# Patient Record
Sex: Female | Born: 1955 | Race: Black or African American | Hispanic: No | Marital: Married | Smoking: Current every day smoker
Health system: Southern US, Community
[De-identification: ages and names within clinical notes are randomized; demographics above are authoritative.]

## PROBLEM LIST (undated history)

## (undated) DIAGNOSIS — Z923 Personal history of irradiation: Secondary | ICD-10-CM

## (undated) DIAGNOSIS — D242 Benign neoplasm of left breast: Secondary | ICD-10-CM

## (undated) DIAGNOSIS — I10 Essential (primary) hypertension: Secondary | ICD-10-CM

## (undated) HISTORY — PX: APPENDECTOMY: SHX54

## (undated) HISTORY — DX: Personal history of irradiation: Z92.3

## (undated) HISTORY — PX: ABDOMINAL HYSTERECTOMY: SHX81

## (undated) MED FILL — Dexamethasone Sodium Phosphate Inj 100 MG/10ML: INTRAMUSCULAR | Qty: 1 | Status: AC

## (undated) MED FILL — Fosaprepitant Dimeglumine For IV Infusion 150 MG (Base Eq): INTRAVENOUS | Qty: 5 | Status: AC

---

## 2002-01-21 ENCOUNTER — Encounter (INDEPENDENT_AMBULATORY_CARE_PROVIDER_SITE_OTHER): Payer: Self-pay | Admitting: Specialist

## 2002-01-21 ENCOUNTER — Observation Stay (HOSPITAL_COMMUNITY): Admission: RE | Admit: 2002-01-21 | Discharge: 2002-01-22 | Payer: Self-pay | Admitting: *Deleted

## 2002-01-21 ENCOUNTER — Encounter: Payer: Self-pay | Admitting: *Deleted

## 2003-05-21 ENCOUNTER — Other Ambulatory Visit: Admission: RE | Admit: 2003-05-21 | Discharge: 2003-05-21 | Payer: Self-pay | Admitting: *Deleted

## 2004-05-24 ENCOUNTER — Other Ambulatory Visit: Admission: RE | Admit: 2004-05-24 | Discharge: 2004-05-24 | Payer: Self-pay | Admitting: *Deleted

## 2004-07-26 ENCOUNTER — Emergency Department: Payer: Self-pay | Admitting: Emergency Medicine

## 2006-05-22 ENCOUNTER — Other Ambulatory Visit: Admission: RE | Admit: 2006-05-22 | Discharge: 2006-05-22 | Payer: Self-pay | Admitting: *Deleted

## 2008-10-24 ENCOUNTER — Encounter: Admission: RE | Admit: 2008-10-24 | Discharge: 2008-10-24 | Payer: Self-pay | Admitting: Internal Medicine

## 2009-01-26 ENCOUNTER — Other Ambulatory Visit: Admission: RE | Admit: 2009-01-26 | Discharge: 2009-01-26 | Payer: Self-pay | Admitting: Obstetrics and Gynecology

## 2009-03-02 ENCOUNTER — Encounter: Admission: RE | Admit: 2009-03-02 | Discharge: 2009-03-02 | Payer: Self-pay | Admitting: Internal Medicine

## 2011-01-21 NOTE — Op Note (Signed)
[facility]  Patient:    Mariah Schultz, Mariah Schultz Visit Number: [id number] MRN: [id number]          Service Type: SUR Location: 4W 0460 02 Attending Physician:  Collene Schlichter Dictated by:   Almedia Balls. Randell Patient, M.D. Proc. Date: 01/21/02 Admit Date:  01/21/2002   CC:         Leatha Gilding. Mezer, M.D.   Operative Report  PREOPERATIVE DIAGNOSES: 1. Abnormal uterine bleeding. 2. Uterine enlargement, rule out adenomyosis.  POSTOPERATIVE DIAGNOSES: 1. Abnormal uterine bleeding. 2. Uterine enlargement, rule out adenomyosis. 3. Pending pathology.  OPERATION:  Abdominal supracervical hysterectomy.  ANESTHESIA:  General orotracheal.  OPERATOR:  Almedia Balls. Randell Patient, M.D.  FIRST ASSISTANT:  Leatha Gilding. Mezer, M.D.  INDICATION FOR SURGERY:  The patient is a 55 year old with the above-noted problems, who was counseled as to the need for surgery and the type of surgery to be performed.  She was fully counseled as to the nature of the procedure and the risks involved to include risks of anesthesia; injury to bowel, bladder, blood vessels, ureters, postoperative hemorrhage, infection, recuperation, and possible hormone replacement should her ovaries be removed. She fully understands all of these considerations and wishes to proceed on Jan 21, 2002.  OPERATIVE FINDINGS:  On exploration of the upper abdomen, the lower liver edge area, the gallbladder, spleen, kidneys, periaortic areas, and appendix were normal to visualization and/or palpation.  The uterus was approximately 10-[redacted] weeks gestational size and soft.  Both ovaries appeared normal with hemorrhagic probable corpus lutein cyst present on the right ovary. Both tubes appeared normal as well.  DESCRIPTION OF PROCEDURE:  With the patient under general anesthesia, prepared and draped in the usual sterile fashion, with a Foley catheter in the bladder, a lower abdominal transverse incision was made and carried into the  peritoneal cavity without difficulty.  Self-retaining retractor was placed, and the bowel was packed off.  Kelly clamps were used to clamp the uteroovarian anastomoses, tubes, and round ligaments bilaterally for traction and hemostasis.  The round ligaments were then transected using Bovie electrocoagulation.  Additional sutures were necessary in the area of the left round ligament.  Heaney clamps were placed across the uteroovarian anastomoses bilaterally; these areas were then cut and individually doubly ligated with 1 chromic catgut for conservation of both ovaries.  Skeletonization of the uterine vessels and development of the bladder flap was then accomplished using Bovie electrocoagulation and sharp dissection.  Heaney clamps were then used to clamp the uterine vessels bilaterally which were then cut, clamped, and suture ligated with 1 chromic catgut.  Heaney clamps were likewise used to clamp the remaining portions of the cardinal ligaments bilaterally; these structures were then cut and suture ligated with 1 chromic catgut.  It was then possible to excise the uterine fundus.  This was accomplished using Bovie electrocoagulation.  Small bleeders were rendered hemostatic with Bovie electrocoagulation, and larger vessels were suture ligated.  Figure-of-eight sutures of #1 chromic catgut were used to cross the cervical stump for hemostasis and reapproximation of this area.  After noting that hemostasis was maintained, the areas were reperitonealized with interrupted mattress of 1 chromic catgut.  The ovaries were then suspended from the round ligaments by interrupted sutures of #1 chromic catgut.  The are was lavaged with copious amounts of Lactated Ringers solution and after small bleeders had been either rendered hemostatic using Bovie electrocoagulation or small suture ligatures, the peritoneum was closed with continuous suture of 0 Vicryl.  Fascia  was closed with two sutures of 0  Vicryl which were brought from the lateral aspects of the incision and tied separately in the midline.  Each layer was lavaged with copious amounts of Lactated Ringers solution prior to closure. The subcutaneous fat layer was reapproximated with interrupted sutures of 0 Vicryl.  Skin was closed with a subcuticular suture of 3-0 plain catgut. Estimated blood loss was 300 mL.  The patient was taken to the recovery room in good condition with clear urine in the Foley catheter tubing.  She will be placed on 23-hour observation following surgery. Dictated by:   Almedia Balls Randell Patient, M.D. Attending Physician:  Collene Schlichter DD:  01/21/02 TD:  01/21/02 Job: [id number]

## 2011-01-21 NOTE — H&P (Signed)
[facility]  Patient:    Mariah Schultz, Mariah Schultz Visit Number: [id number] MRN: [id number]          Service Type: SUR Location: 4W 0460 02 Attending Physician:  Collene Schlichter Dictated by:   Almedia Balls. Randell Patient, M.D. Admit Date:  01/21/2002                           History and Physical  CHIEF COMPLAINT:  Excessive bleeding, pain, fibroids.  HISTORY OF PRESENT ILLNESS:  The patient is a 55 year old gravida 4, para 3, whose last menstrual period was in mid-February 2003.  She underwent Pap smear in April 2003 which was normal.  She has been seen in our office since early April 2003 with complaints of heavy bleeding, for which she changes tampon and pad every 45 minutes, with clots present.  She has severe pain at other times. Examination in early April revealed her uterus to be approximately [redacted] weeks gestational size and somewhat tender.  She is admitted at this time for abdominal hysterectomy and possible bilateral salpingo-oophorectomy because of the probable uterine fibroids and abnormal bleeding.  She underwent hysteroscopy D&C after saline sonogram indicated an increase in endometrial tissue; the pathology report on the [facility] specimen was totally benign.  She has been fully counseled as to the nature of the procedure and the risks involved, to include risks of anesthesia, injury to bowel, bladder, blood vessels, ureters, postoperative hemorrhage, infection, recuperation, possibility of estrogen replacement should her ovaries be removed.  She fully understands all these considerations and wishes to proceed on Jan 21, 2002.  PAST MEDICAL HISTORY:  Past medical history includes an illness for which she was hospitalized, chronic bronchitis in 1986.  She is smoking approximately one pack a day, which she has done for greater than 30 years and states that she is decreasing her tobacco usage.  ALLERGIES:  She is allergic to no medications.  MEDICATIONS:  She has  taken no medications recently except for analgesics for pain.  FAMILY HISTORY:  A number of relatives with cardiovascular disease.  REVIEW OF SYSTEMS:  HEENT:  Wears glasses.  CARDIORESPIRATORY:  Negative. GASTROINTESTINAL:  Negative.  GENITOURINARY:  As in present illness. NEUROMUSCULAR:  Negative.  PHYSICAL EXAMINATION:  VITAL SIGNS:  Height 5 feet 5-1/2 inches.  Weight 184 pounds.  Blood pressure 112/70.  Respirations 18.  GENERAL:  A well-developed black female in no acute distress.  HEENT:  Within normal limits.  NECK:  Supple without masses, adenopathy, or bruits.  CHEST:  Lungs clear to percussion and auscultation.  CARDIAC:  Regular rate and rhythm without murmurs.  ABDOMEN:  Soft with slight mass effect in the lower abdomen, which is irregular but nontender.  PELVIC:  External genitalia, Bartholins, urethra, and Skenes glands within normal limits.  Vagina is clean.  Cervix is slightly inflamed and displaced downward and to the left.  Uterus is midposition, approximately [redacted] weeks gestational size and quite irregular and somewhat tender.  Adnexal areas are not palpable because of the size of her uterus but on ultrasound appeared to be normal.  Anterior and posterior cul-de-sac exam is confirmatory.  EXTREMITIES:  Within normal limits.  NEUROLOGIC:  Central nervous system grossly intact.  SKIN:  Without suspicious lesions.  IMPRESSION:  Abnormal uterine bleeding, pelvic pain, probably uterine fibroids.  DISPOSITION:  As noted above. Dictated by:   Almedia Balls Randell Patient, M.D. Attending Physician:  Collene Schlichter DD:  01/17/02 TD:  01/18/02 Job: [id number]

## 2011-01-21 NOTE — Discharge Summary (Signed)
[facility]  Patient:    Mariah Schultz, Mariah Schultz Visit Number: [id number] MRN: [id number]          Service Type: SUR Location: 4W 0460 02 Attending Physician:  Collene Schlichter Dictated by:   Almedia Balls. Randell Patient, M.D. Admit Date:  01/21/2002 Discharge Date: 01/22/2002                             Discharge Summary  HISTORY:  Patient is a 55 year old with uterine enlargement, abnormal uterine bleeding, anemia secondary to above for hysterectomy, possible bilateral salpingo-oophorectomy on Jan 21, 2002.  The remainder of her history and physical are as previously dictated.  LABORATORY DATA:  Preoperative hemoglobin 9.6 g.  Chest x-ray was within normal limits.  HOSPITAL COURSE:  The patient was taken to the operating room on Jan 21, 2002 at which time an abdominal supracervical hysterectomy was performed.  The patient did well postoperatively.  Diet and ambulation were progressed over the evening of May 19 and early morning of May 20.  On the morning of May 20 she was afebrile and experiencing no problems except for pain which was controlled by analgesics.  It was felt that she could be discharged at this time.  FINAL DIAGNOSES:  Abnormal uterine bleeding, uterine enlargement, anemia secondary to above.  OPERATION:  Abdominal supracervical hysterectomy.  PATHOLOGY:  Report unavailable at the time of dictation.  DISPOSITION:  Discharged home to return to the office in two weeks for follow-up.  She was instructed to gradually progress her activities over several weeks at home and to limit lifting and driving for two weeks.  She was fully ambulatory, on a regular diet, and in good condition at the time of discharge.  She was given prescription for Dilaudid or generic 2 mg #30 to be taken one or two q.4h. p.r.n. pain and doxycycline 100 mg #12 to be taken one b.i.d. Dictated by:   Almedia Balls Randell Patient, M.D. Attending Physician:  Collene Schlichter DD:   01/22/02 TD:  01/24/02 Job: [phone] [id number]

## 2012-12-24 ENCOUNTER — Other Ambulatory Visit: Payer: Self-pay | Admitting: Internal Medicine

## 2012-12-24 DIAGNOSIS — Z1231 Encounter for screening mammogram for malignant neoplasm of breast: Secondary | ICD-10-CM

## 2013-01-03 ENCOUNTER — Other Ambulatory Visit (HOSPITAL_COMMUNITY)
Admission: RE | Admit: 2013-01-03 | Discharge: 2013-01-03 | Disposition: A | Discharge status: Home / Self Care | Payer: BC Managed Care – PPO | Source: Ambulatory Visit | Attending: Obstetrics and Gynecology | Admitting: Obstetrics and Gynecology

## 2013-01-03 ENCOUNTER — Other Ambulatory Visit: Payer: Self-pay | Admitting: Nurse Practitioner

## 2013-01-03 DIAGNOSIS — Z01419 Encounter for gynecological examination (general) (routine) without abnormal findings: Secondary | ICD-10-CM | POA: Insufficient documentation

## 2013-01-03 DIAGNOSIS — Z1151 Encounter for screening for human papillomavirus (HPV): Secondary | ICD-10-CM | POA: Insufficient documentation

## 2013-01-24 ENCOUNTER — Ambulatory Visit
Admission: RE | Admit: 2013-01-24 | Discharge: 2013-01-24 | Disposition: A | Discharge status: Home / Self Care | Payer: BC Managed Care – PPO | Source: Ambulatory Visit | Attending: Internal Medicine | Admitting: Internal Medicine

## 2013-01-24 DIAGNOSIS — Z1231 Encounter for screening mammogram for malignant neoplasm of breast: Secondary | ICD-10-CM

## 2013-02-25 ENCOUNTER — Other Ambulatory Visit: Payer: Self-pay | Admitting: Internal Medicine

## 2013-02-25 ENCOUNTER — Encounter (HOSPITAL_COMMUNITY): Payer: Self-pay | Admitting: *Deleted

## 2013-02-25 ENCOUNTER — Ambulatory Visit
Admission: RE | Admit: 2013-02-25 | Discharge: 2013-02-25 | Disposition: A | Discharge status: Home / Self Care | Payer: BC Managed Care – PPO | Source: Ambulatory Visit | Attending: Internal Medicine | Admitting: Internal Medicine

## 2013-02-25 ENCOUNTER — Emergency Department (HOSPITAL_COMMUNITY): Payer: BC Managed Care – PPO

## 2013-02-25 ENCOUNTER — Encounter (HOSPITAL_COMMUNITY): Payer: Self-pay | Admitting: Anesthesiology

## 2013-02-25 ENCOUNTER — Emergency Department (HOSPITAL_COMMUNITY): Payer: BC Managed Care – PPO | Admitting: Anesthesiology

## 2013-02-25 ENCOUNTER — Inpatient Hospital Stay (HOSPITAL_COMMUNITY)
Admission: EM | Admit: 2013-02-25 | Discharge: 2013-02-27 | DRG: 883 | Disposition: A | Discharge status: Home / Self Care | Payer: BC Managed Care – PPO | Attending: General Surgery | Admitting: General Surgery

## 2013-02-25 ENCOUNTER — Encounter (HOSPITAL_COMMUNITY): Admission: EM | Disposition: A | Discharge status: Home / Self Care | Payer: Self-pay | Source: Home / Self Care

## 2013-02-25 DIAGNOSIS — K3580 Unspecified acute appendicitis: Secondary | ICD-10-CM

## 2013-02-25 DIAGNOSIS — K352 Acute appendicitis with generalized peritonitis, without abscess: Principal | ICD-10-CM | POA: Diagnosis present

## 2013-02-25 DIAGNOSIS — R109 Unspecified abdominal pain: Secondary | ICD-10-CM

## 2013-02-25 DIAGNOSIS — K3533 Acute appendicitis with perforation and localized peritonitis, with abscess: Secondary | ICD-10-CM | POA: Diagnosis present

## 2013-02-25 DIAGNOSIS — R630 Anorexia: Secondary | ICD-10-CM | POA: Diagnosis present

## 2013-02-25 DIAGNOSIS — F17200 Nicotine dependence, unspecified, uncomplicated: Secondary | ICD-10-CM | POA: Diagnosis present

## 2013-02-25 DIAGNOSIS — I959 Hypotension, unspecified: Secondary | ICD-10-CM | POA: Diagnosis present

## 2013-02-25 DIAGNOSIS — Z7982 Long term (current) use of aspirin: Secondary | ICD-10-CM

## 2013-02-25 DIAGNOSIS — K35209 Acute appendicitis with generalized peritonitis, without abscess, unspecified as to perforation: Principal | ICD-10-CM | POA: Diagnosis present

## 2013-02-25 HISTORY — DX: Essential (primary) hypertension: I10

## 2013-02-25 HISTORY — PX: LAPAROSCOPIC APPENDECTOMY: SHX408

## 2013-02-25 LAB — BASIC METABOLIC PANEL
BUN: 13 mg/dL (ref 6–23)
CO2: 29 mEq/L (ref 19–32)
Calcium: 9.3 mg/dL (ref 8.4–10.5)
Chloride: 89 mEq/L — ABNORMAL LOW (ref 96–112)
Creatinine, Ser: 0.83 mg/dL (ref 0.50–1.10)
GFR calc Af Amer: 88 mL/min — ABNORMAL LOW (ref >90)
GFR calc non Af Amer: 76 mL/min — ABNORMAL LOW (ref >90)
Glucose, Bld: 104 mg/dL — ABNORMAL HIGH (ref 70–99)
Potassium: 3.2 mEq/L — ABNORMAL LOW (ref 3.5–5.1)
Sodium: 130 mEq/L — ABNORMAL LOW (ref 135–145)

## 2013-02-25 LAB — URINE MICROSCOPIC-ADD ON

## 2013-02-25 LAB — URINALYSIS, ROUTINE W REFLEX MICROSCOPIC
Bilirubin Urine: NEGATIVE
Glucose, UA: NEGATIVE mg/dL
Ketones, ur: NEGATIVE mg/dL
Leukocytes, UA: NEGATIVE
Nitrite: NEGATIVE
Protein, ur: NEGATIVE mg/dL
Specific Gravity, Urine: >1.046 — ABNORMAL HIGH (ref 1.005–1.030)
Urobilinogen, UA: 1 mg/dL (ref 0.0–1.0)
pH: 6 (ref 5.0–8.0)

## 2013-02-25 LAB — CBC
HCT: 44 % (ref 36.0–46.0)
Hemoglobin: 15.8 g/dL — ABNORMAL HIGH (ref 12.0–15.0)
MCH: 31.6 pg (ref 26.0–34.0)
MCHC: 35.9 g/dL (ref 30.0–36.0)
MCV: 88 fL (ref 78.0–100.0)
Platelets: 268 10*3/uL (ref 150–400)
RBC: 5 MIL/uL (ref 3.87–5.11)
RDW: 14.1 % (ref 11.5–15.5)
WBC: 19.7 10*3/uL — ABNORMAL HIGH (ref 4.0–10.5)

## 2013-02-25 SURGERY — APPENDECTOMY, LAPAROSCOPIC
Anesthesia: General | Site: Abdomen | Wound class: Dirty or Infected

## 2013-02-25 MED ORDER — SUCCINYLCHOLINE CHLORIDE 20 MG/ML IJ SOLN
INTRAMUSCULAR | Status: DC | PRN
  Administered 2013-02-25: 140 mg via INTRAVENOUS

## 2013-02-25 MED ORDER — HYDROMORPHONE HCL PF 1 MG/ML IJ SOLN
INTRAMUSCULAR | Status: AC
  Administered 2013-02-25 (×2): 0.5 mg
  Filled 2013-02-25: qty 1

## 2013-02-25 MED ORDER — FENTANYL CITRATE 0.05 MG/ML IJ SOLN
INTRAMUSCULAR | Status: DC | PRN
  Administered 2013-02-25: 50 ug via INTRAVENOUS
  Administered 2013-02-25 (×2): 100 ug via INTRAVENOUS

## 2013-02-25 MED ORDER — ROCURONIUM BROMIDE 100 MG/10ML IV SOLN
INTRAVENOUS | Status: DC | PRN
  Administered 2013-02-25: 25 mg via INTRAVENOUS

## 2013-02-25 MED ORDER — IOHEXOL 300 MG/ML  SOLN
100.0000 mL | Freq: Once | INTRAMUSCULAR | Status: AC | PRN
  Administered 2013-02-25: 100 mL via INTRAVENOUS

## 2013-02-25 MED ORDER — SODIUM CHLORIDE 0.9 % IR SOLN
Status: DC | PRN
  Administered 2013-02-25: 1000 mL

## 2013-02-25 MED ORDER — ONDANSETRON HCL 4 MG/2ML IJ SOLN
INTRAMUSCULAR | Status: DC | PRN
  Administered 2013-02-25: 4 mg via INTRAVENOUS

## 2013-02-25 MED ORDER — LIDOCAINE HCL (CARDIAC) 20 MG/ML IV SOLN
INTRAVENOUS | Status: DC | PRN
  Administered 2013-02-25: 100 mg via INTRAVENOUS

## 2013-02-25 MED ORDER — IOHEXOL 300 MG/ML  SOLN
30.0000 mL | Freq: Once | INTRAMUSCULAR | Status: AC | PRN
  Administered 2013-02-25: 30 mL via ORAL

## 2013-02-25 MED ORDER — MIDAZOLAM HCL 5 MG/5ML IJ SOLN
INTRAMUSCULAR | Status: DC | PRN
  Administered 2013-02-25: 2 mg via INTRAVENOUS

## 2013-02-25 MED ORDER — BUPIVACAINE-EPINEPHRINE 0.5% -1:200000 IJ SOLN
INTRAMUSCULAR | Status: DC | PRN
  Administered 2013-02-25: 18 mL

## 2013-02-25 MED ORDER — LACTATED RINGERS IV SOLN
INTRAVENOUS | Status: DC | PRN
  Administered 2013-02-25 (×2): via INTRAVENOUS

## 2013-02-25 MED ORDER — GLYCOPYRROLATE 0.2 MG/ML IJ SOLN
INTRAMUSCULAR | Status: DC | PRN
  Administered 2013-02-25: 0.6 mg via INTRAVENOUS

## 2013-02-25 MED ORDER — PROPOFOL 10 MG/ML IV BOLUS
INTRAVENOUS | Status: DC | PRN
  Administered 2013-02-25: 200 mg via INTRAVENOUS

## 2013-02-25 MED ORDER — NEOSTIGMINE METHYLSULFATE 1 MG/ML IJ SOLN
INTRAMUSCULAR | Status: DC | PRN
  Administered 2013-02-25: 4 mg via INTRAVENOUS

## 2013-02-25 MED ORDER — SODIUM CHLORIDE 0.9 % IV SOLN
1.0000 g | INTRAVENOUS | Status: AC
  Administered 2013-02-25: 1 g via INTRAVENOUS
  Filled 2013-02-25: qty 1

## 2013-02-25 SURGICAL SUPPLY — 42 items
APL SKNCLS STERI-STRIP NONHPOA (GAUZE/BANDAGES/DRESSINGS) ×1
APPLIER CLIP LOGIC TI 5 (MISCELLANEOUS) IMPLANT
APPLIER CLIP ROT 10 11.4 M/L (STAPLE)
APR CLP MED LRG 11.4X10 (STAPLE)
APR CLP MED LRG 33X5 (MISCELLANEOUS)
BAG SPEC RTRVL LRG 6X4 10 (ENDOMECHANICALS) ×1
BANDAGE ADHESIVE 1X3 (GAUZE/BANDAGES/DRESSINGS) ×6 IMPLANT
BENZOIN TINCTURE PRP APPL 2/3 (GAUZE/BANDAGES/DRESSINGS) ×2 IMPLANT
CANISTER SUCTION 2500CC (MISCELLANEOUS) ×2 IMPLANT
CHLORAPREP W/TINT 26ML (MISCELLANEOUS) ×2 IMPLANT
CLIP APPLIE ROT 10 11.4 M/L (STAPLE) IMPLANT
CLOTH BEACON ORANGE TIMEOUT ST (SAFETY) ×2 IMPLANT
CLSR STERI-STRIP ANTIMIC 1/2X4 (GAUZE/BANDAGES/DRESSINGS) ×1 IMPLANT
COVER SURGICAL LIGHT HANDLE (MISCELLANEOUS) ×2 IMPLANT
CUTTER LINEAR ENDO 35 ETS (STAPLE) ×1 IMPLANT
CUTTER LINEAR ENDO 35 ETS TH (STAPLE) IMPLANT
DECANTER SPIKE VIAL GLASS SM (MISCELLANEOUS) ×2 IMPLANT
ELECT REM PT RETURN 9FT ADLT (ELECTROSURGICAL) ×2
ELECTRODE REM PT RTRN 9FT ADLT (ELECTROSURGICAL) ×1 IMPLANT
ENDOLOOP SUT PDS II  0 18 (SUTURE)
ENDOLOOP SUT PDS II 0 18 (SUTURE) IMPLANT
GLOVE SURG SIGNA 7.5 PF LTX (GLOVE) ×2 IMPLANT
GOWN STRL NON-REIN LRG LVL3 (GOWN DISPOSABLE) ×2 IMPLANT
GOWN STRL REIN XL XLG (GOWN DISPOSABLE) ×2 IMPLANT
KIT BASIN OR (CUSTOM PROCEDURE TRAY) ×2 IMPLANT
KIT ROOM TURNOVER OR (KITS) ×2 IMPLANT
NS IRRIG 1000ML POUR BTL (IV SOLUTION) ×2 IMPLANT
PAD ARMBOARD 7.5X6 YLW CONV (MISCELLANEOUS) ×4 IMPLANT
POUCH SPECIMEN RETRIEVAL 10MM (ENDOMECHANICALS) ×2 IMPLANT
RELOAD /EVU35 (ENDOMECHANICALS) IMPLANT
RELOAD CUTTER ETS 35MM STAND (ENDOMECHANICALS) IMPLANT
SCALPEL HARMONIC ACE (MISCELLANEOUS) ×2 IMPLANT
SET IRRIG TUBING LAPAROSCOPIC (IRRIGATION / IRRIGATOR) ×2 IMPLANT
SLEEVE ENDOPATH XCEL 5M (ENDOMECHANICALS) ×2 IMPLANT
SPECIMEN JAR SMALL (MISCELLANEOUS) ×2 IMPLANT
SUT MON AB 4-0 PC3 18 (SUTURE) ×2 IMPLANT
TOWEL OR 17X24 6PK STRL BLUE (TOWEL DISPOSABLE) ×2 IMPLANT
TOWEL OR 17X26 10 PK STRL BLUE (TOWEL DISPOSABLE) ×2 IMPLANT
TRAY LAPAROSCOPIC (CUSTOM PROCEDURE TRAY) ×2 IMPLANT
TROCAR XCEL BLUNT TIP 100MML (ENDOMECHANICALS) ×2 IMPLANT
TROCAR XCEL NON-BLD 5MMX100MML (ENDOMECHANICALS) ×2 IMPLANT
WATER STERILE IRR 1000ML POUR (IV SOLUTION) IMPLANT

## 2013-02-25 NOTE — ED Notes (Signed)
Pt did not answer when called for triage 

## 2013-02-25 NOTE — ED Notes (Signed)
Pt is here with lower abdominal pain since Friday and states that with movement she can feel the pressure.  Pt had CT done today and was told to come to the EMERGENCY room for acute appendicitis.

## 2013-02-25 NOTE — Op Note (Signed)
APPENDECTOMY LAPAROSCOPIC  Procedure Note  Mariah Schultz [facility] 02/25/2013   Pre-op Diagnosis: perforated appendicitis     Post-op Diagnosis: sam3  Procedure(s): APPENDECTOMY LAPAROSCOPIC  Surgeon(s): Shelly Rubenstein, MD  Anesthesia: General  Staff:  Circulator: Ezzard Flax, RN Relief Circulator: Carlean Purl, RN Scrub Person: Arnoldo Morale, CST  Estimated Blood Loss: Minimal               Specimens: sent to path          [facility]   Date: 02/25/2013  Time: 10:29 PM

## 2013-02-25 NOTE — Op Note (Signed)
NAME:  Mariah Schultz NO.:  [id number]  MEDICAL RECORD NO.:  [id number]  LOCATION:  [facility]                         FACILITY:  [facility]  PHYSICIAN:  Abigail Miyamoto, M.D. DATE OF BIRTH:  November 21, 1955  DATE OF PROCEDURE:  02/25/2013 DATE OF DISCHARGE:                              OPERATIVE REPORT   PREOPERATIVE DIAGNOSIS:  Perforated appendicitis.  POSTOPERATIVE DIAGNOSIS:  Perforated appendicitis.  PROCEDURE:  Laparoscopic appendectomy.  SURGEON:  Abigail Miyamoto, MD  ANESTHESIA:  General and 0.5% Marcaine.  ESTIMATED BLOOD LOSS:  Minimal.  INDICATION:  This is a 57 year old female, who presented with 3 day history of right lower quadrant abdominal pain.  She was found on CAT scan to have findings consistent with acute appendicitis with abscess. Decision was made to proceed to the operating room for laparoscopic appendectomy.  FINDINGS:  The patient was found to have small bowel completely tethered to the midline of the abdominal cavity.  She had a phlegmon from the acute appendicitis.  This appeared to involve an ovary with an ovarian cyst as well.  PROCEDURE IN DETAIL:  The patient was brought to the operating room, identified as Mariah Schultz.  She was placed supine on the operating table and general anesthesia was induced.  Her abdomen was then prepped and draped in usual sterile fashion.  I made a small incision just below the umbilicus with scalpel.  I took this down to the fascia which was opened with scalpel.  A hemostat was then used to pass to the peritoneal cavity under direct vision.  A 0 Vicryl pursestring suture was placed around the fascial opening.  The Hasson port was placed through the opening and insufflation of the abdomen was begun.  I then placed a 5 mm trocar in the patient's right upper quadrant under direct vision.  I visualized the right lower quadrant of pelvis.  The patient had a lot of small bowel tether to the abdominal  wall.  There was no plane that could be easily identified.  I therefore had to place my lower trocar to the right of the midline.  This was done under direct vision and avoided the small bowel.  I then visualized the right colon and cecum.  The patient had what appeared to be a acute appendicitis going into a phlegmon involving the ovary with an ovarian cyst as well. This cystic structure easily tore off of the pelvic sidewall and appendix and was pulled out through the umbilical trocar and sent to Pathology.  I was able to finally identify the base of the appendix and transect with the laparoscopic GIA stapler.  The rest of the appendix went down to a phlegmon involving the ovary and the right pelvis.  I was finally able to pull the appendix up and separated from what appeared to be the ovary.  The mesoappendix was controlled with the Harmonic Scalpel.  The appendix came apart in pieces and was pulled out individually through the port of the umbilicus.  Once this was done, I thoroughly irrigated the right lower quadrant with normal saline.  There was no purulence identified, only phlegmon.  I believe the abscess from the CAT scan represented more of an ovarian cyst.  At this point, hemostasis appeared to be achieved.  After irrigating the abdomen with normal saline, all ports removed under direct vision.  The abdomen was deflated with 0 Vicryl.  The umbilicus was then tied in place closing the fascial defect.  All incisions were anesthetized with Marcaine and closed with 4-0 Monocryl subcuticular sutures.  Steri-Strips and Band-Aids were then applied.  The patient tolerated the procedure well.  All of the counts were correct at the end of the procedure.  The patient was then extubated in the operating room and taken in stable condition to recovery room.     Abigail Miyamoto, M.D.     DB/MEDQ  D:  02/25/2013  T:  02/25/2013  Job:  [id number]

## 2013-02-25 NOTE — Transfer of Care (Signed)
Immediate Anesthesia Transfer of Care Note  Patient: Mariah Schultz  Procedure(s) Performed: Procedure(s): APPENDECTOMY LAPAROSCOPIC (N/A)  Patient Location: PACU  Anesthesia Type:General  Level of Consciousness: awake, alert  and oriented  Airway & Oxygen Therapy: Patient Spontanous Breathing and Patient connected to nasal cannula oxygen  Post-op Assessment: Report given to PACU RN and Post -op Vital signs reviewed and stable  Post vital signs: Reviewed and stable  Complications: No apparent anesthesia complications

## 2013-02-25 NOTE — ED Provider Notes (Signed)
She has been ill for several days with low abdominal pain, anorexia, weaknesss and low blood pressure.today, she saw her PCP, who ordered a CT scan, that returned as positive for appendicitis.  Exam alert, calm, cooperative, in mild distress. Abdomen- Mild, low abdominal tenderness, bilaterally.  Assessment: Uncomplicated acute appendicitis. She will need surgical consultation and likely appendectomy, today.   1731 PM-Consult complete with Dr. Magnus Ivan. Patient case explained and discussed. He agrees to admit patient for further evaluation and treatment. Call ended at 1735   Medical screening examination/treatment/procedure(s) were conducted as a shared visit with non-physician practitioner(s) and myself.  I personally evaluated the patient during the encounter  Flint Melter, MD 02/25/13 1816

## 2013-02-25 NOTE — ED Provider Notes (Signed)
History    CSN: [id number] Arrival date & time 02/25/13  1500  First MD Initiated Contact with Patient 02/25/13 1530     Chief Complaint  Patient presents with  . Abdominal Pain   (Consider location/radiation/quality/duration/timing/severity/associated sxs/prior Treatment) HPI Pt is a 57yo female advised to come to ED by her PCP, Dr. Nehemiah Settle after CT done this morning indicated acute appendicitis. Dr. Nehemiah Settle saw her this morning for lower abdominal pain that started on Friday 6/20, described as achy and cramping, worse with standing and walking, 5/10.  Tried OTC pain medication w/o relief.  Pt received demoral at Dr. Idelle Crouch office prior to going to CT, which did relieve pain.  Pt also reports low grade fever on Saturday but no n/v/d.  Pt reports hypotension despite normally having HTN for which she takes medication.  Presvious abd surgery: partial hysterectomy.  Pt still has appendix and gallbladder.   Past Medical History  Diagnosis Date  . Hypertension    Past Surgical History  Procedure Laterality Date  . Abdominal hysterectomy     No family history on file. History  Substance Use Topics  . Smoking status: Current Every Day Smoker  . Smokeless tobacco: Not on file  . Alcohol Use: Yes     Comment: occ   OB History   Grav Para Term Preterm Abortions TAB SAB Ect Mult Living                 Review of Systems  Constitutional: Positive for fever ( "low grade" ), chills and diaphoresis. Negative for appetite change.  Respiratory: Negative for cough and shortness of breath.   Cardiovascular: Negative for chest pain.  Gastrointestinal: Positive for abdominal pain and constipation. Negative for nausea, vomiting, diarrhea and abdominal distention.  Genitourinary: Negative for dysuria, frequency, flank pain, vaginal bleeding, vaginal discharge and vaginal pain.  All other systems reviewed and are negative.    Allergies  Other  Home Medications  No current outpatient  prescriptions on file. BP 106/62  Pulse 78  Temp(Src) 99.2 F (37.3 C) (Oral)  Resp 18  Ht 5\' 5"  (1.651 m)  Wt 188 lb 4.4 oz (85.4 kg)  BMI 31.33 kg/m2  SpO2 91% Physical Exam  Nursing note and vitals reviewed. Constitutional: She appears well-developed and well-nourished. No distress.  Pt sitting comfortably on exam bed. No acute distress.   HENT:  Head: Normocephalic and atraumatic.  Eyes: Conjunctivae are normal. No scleral icterus.  Neck: Normal range of motion. Neck supple.  Cardiovascular: Normal rate, regular rhythm and normal heart sounds.   Pulmonary/Chest: Effort normal and breath sounds normal. No respiratory distress. She has no wheezes. She has no rales. She exhibits no tenderness.  Abdominal: Soft. Bowel sounds are normal. She exhibits no distension and no mass. There is tenderness ( mild RLQ pain). There is no rebound and no guarding.  Musculoskeletal: Normal range of motion.  Neurological: She is alert.  Skin: Skin is warm and dry. She is not diaphoretic.    ED Course  Procedures (including critical care time) Labs Reviewed  CBC - Abnormal; Notable for the following:    WBC 19.7 (*)    Hemoglobin 15.8 (*)    All other components within normal limits  BASIC METABOLIC PANEL - Abnormal; Notable for the following:    Sodium 130 (*)    Potassium 3.2 (*)    Chloride 89 (*)    Glucose, Bld 104 (*)    GFR calc non Af Amer 76 (*)  GFR calc Af Amer 88 (*)    All other components within normal limits  URINALYSIS, ROUTINE W REFLEX MICROSCOPIC - Abnormal; Notable for the following:    Specific Gravity, Urine >1.046 (*)    Hgb urine dipstick TRACE (*)    All other components within normal limits  BASIC METABOLIC PANEL - Abnormal; Notable for the following:    Sodium 134 (*)    Glucose, Bld 115 (*)    All other components within normal limits  URINE MICROSCOPIC-ADD ON   Dg Chest 2 View  02/25/2013   *RADIOLOGY REPORT*  Clinical Data: Shortness of breath,  abdominal pain, smoking history  CHEST - 2 VIEW  Comparison: None.  Findings: Mild peribronchial thickening and slightly prominent interstitial markings most likely are related to the patient's smoking history.  No active infiltrate or effusion is seen. Mediastinal contours appear normal.  The heart is within upper limits of normal.  There are degenerative changes in the lower thoracic spine.  IMPRESSION: No active lung disease.  Probable chronic changes as noted above.   Original Report Authenticated By: Dwyane Dee, M.D.   Ct Abdomen Pelvis W Contrast  02/25/2013   *RADIOLOGY REPORT*  Clinical Data: Lower abdominal pain.  Constipation and loss of appetite.  CT ABDOMEN AND PELVIS WITH CONTRAST  Technique:  Multidetector CT imaging of the abdomen and pelvis was performed following the standard protocol during bolus administration of intravenous contrast.  Contrast: 30mL OMNIPAQUE IOHEXOL 300 MG/ML  SOLN, OMNIPAQUE IOHEXOL 300 MG/ML  SOLN  Comparison: None.  Findings: Lung bases show scattered scarring.  The left anterior descending coronary artery calcification.  Heart size normal.  No pericardial or pleural effusion.  Liver measures 20.5 cm.  Liver, gallbladder, adrenal glands, kidneys, spleen, pancreas, stomach and small bowel are unremarkable.  Appendix is dilated and fluid-filled, measuring 10 mm in diameter. There is an adjacent fluid collection, measuring 2.6 x 3.5 cm (image 73).  Surrounding inflammatory changes are seen in the right lower quadrant with lymph nodes measuring up to 9 mm in short axis. Colon is otherwise unremarkable.  Small bilateral inguinal hernias contain fat.  No pathologically enlarged lymph nodes.  No pelvic free fluid.  Mild presacral edema. Atherosclerotic calcification of the arterial vasculature without abdominal aortic aneurysm.  No worrisome lytic or sclerotic lesions.  Degenerative changes are seen in the spine.  IMPRESSION:  1.  Changes of appendicitis with a small  periappendiceal abscess. These results were called by telephone on 02/25/2013 at 1403 hours to Dr. Nehemiah Settle, who verbally acknowledged these results. 2.  Hepatomegaly. 3.  Coronary artery calcification.   Original Report Authenticated By: Leanna Battles, M.D.    Date: 02/25/2013  Rate: 72  Rhythm: normal sinus rhythm  QRS Axis: normal  Intervals: normal  ST/T Wave abnormalities: normal  Conduction Disutrbances:none  Narrative Interpretation:   Old EKG Reviewed: none available    No diagnosis found.  MDM  Pt sent by Dr. Nehemiah Settle, PCP for confirmed acute appendicitis on CT scan this a.m.  Pt received demoral PTA, pain well controlled at this time. Denies nausea.  Will obtain labs: CBC, BMP, CXR, EKG, and UA.  Discussed pt with Dr. Effie Shy who consulted general surgery who agreed to come see the pt.   Pt pain is under control while in ED. States she was given medication at PCP PTA.     Junius Finner, PA-C 02/27/13 0107

## 2013-02-25 NOTE — ED Notes (Signed)
Pt c/o lower abd pain, went to PCP then had a CT abdomen done today. Her PCP called her and was told that she had appendicitis and to go to ED for further evaluation. Pt reports she was given a shot of Demeral for pain while at her PCP and it has helped a lot with the pain. Pt in nad, skin warm and dry, resp e/u.

## 2013-02-25 NOTE — Preoperative (Signed)
Beta Blockers   Reason not to administer Beta Blockers:Not Applicable. No home beta blockers 

## 2013-02-25 NOTE — Anesthesia Preprocedure Evaluation (Addendum)
Anesthesia Evaluation  Patient identified by MRN, date of birth, ID band Patient awake    Reviewed: Allergy & Precautions, H&P , NPO status , Patient's Chart, lab work & pertinent test results, reviewed documented beta blocker date and time   Airway Mallampati: II TM Distance: >3 FB Neck ROM: full    Dental  (+) Teeth Intact and Dental Advisory Given   Pulmonary neg pulmonary ROS,  breath sounds clear to auscultation        Cardiovascular hypertension, On Medications Rhythm:regular     Neuro/Psych negative neurological ROS  negative psych ROS   GI/Hepatic negative GI ROS, Neg liver ROS,   Endo/Other  negative endocrine ROS  Renal/GU negative Renal ROS  negative genitourinary   Musculoskeletal   Abdominal   Peds  Hematology negative hematology ROS (+)   Anesthesia Other Findings See surgeon's H&P   Reproductive/Obstetrics negative OB ROS                          Anesthesia Physical Anesthesia Plan  ASA: II and emergent  Anesthesia Plan: General   Post-op Pain Management:    Induction: Intravenous, Rapid sequence and Cricoid pressure planned  Airway Management Planned: Oral ETT  Additional Equipment:   Intra-op Plan:   Post-operative Plan: Extubation in OR  Informed Consent: I have reviewed the patients History and Physical, chart, labs and discussed the procedure including the risks, benefits and alternatives for the proposed anesthesia with the patient or authorized representative who has indicated his/her understanding and acceptance.   Dental Advisory Given and Dental advisory given  Plan Discussed with: CRNA and Surgeon  Anesthesia Plan Comments:        Anesthesia Quick Evaluation

## 2013-02-25 NOTE — H&P (Signed)
Mariah Schultz is an 57 y.o. female.   Chief Complaint: Right lower quadrant abdominal pain HPI: She presents to the emergency apartment with a three-day history of right lower quadrant abdominal pain, fatigue, malaise, and anorexia. This started on Friday. It has slowly gotten worse. It became local in the right lower quadrant. She denies fevers or chills. The pain is sharp and constant. She is otherwise without complaints.  Past Medical History  Diagnosis Date  . Hypertension     Past Surgical History  Procedure Laterality Date  . Abdominal hysterectomy      No family history on file. Social History:  reports that she has been smoking.  She does not have any smokeless tobacco history on file. She reports that  drinks alcohol. She reports that she does not use illicit drugs.  Allergies:  Allergies  Allergen Reactions  . Other     pneumonia vaccine     (Not in a hospital admission)  Results for orders placed during the hospital encounter of 02/25/13 (from the past 48 hour(s))  CBC     Status: Abnormal   Collection Time    02/25/13  3:45 PM      Result Value Range   WBC 19.7 (*) 4.0 - 10.5 K/uL   RBC 5.00  3.87 - 5.11 MIL/uL   Hemoglobin 15.8 (*) 12.0 - 15.0 g/dL   HCT 62.1  30.8 - 65.7 %   MCV 88.0  78.0 - 100.0 fL   MCH 31.6  26.0 - 34.0 pg   MCHC 35.9  30.0 - 36.0 g/dL   RDW 84.6  96.2 - 95.2 %   Platelets 268  150 - 400 K/uL  BASIC METABOLIC PANEL     Status: Abnormal   Collection Time    02/25/13  3:45 PM      Result Value Range   Sodium 130 (*) 135 - 145 mEq/L   Potassium 3.2 (*) 3.5 - 5.1 mEq/L   Chloride 89 (*) 96 - 112 mEq/L   CO2 29  19 - 32 mEq/L   Glucose, Bld 104 (*) 70 - 99 mg/dL   BUN 13  6 - 23 mg/dL   Creatinine, Ser 8.41  0.50 - 1.10 mg/dL   Calcium 9.3  8.4 - 32.4 mg/dL   GFR calc non Af Amer 76 (*) >90 mL/min   GFR calc Af Amer 88 (*) >90 mL/min   Comment:            The eGFR has been calculated     using the CKD EPI equation.      This calculation has not been     validated in all clinical     situations.     eGFR's persistently     <90 mL/min signify     possible Chronic Kidney Disease.  URINALYSIS, ROUTINE W REFLEX MICROSCOPIC     Status: Abnormal   Collection Time    02/25/13  4:02 PM      Result Value Range   Color, Urine YELLOW  YELLOW   APPearance CLEAR  CLEAR   Specific Gravity, Urine >1.046 (*) 1.005 - 1.030   pH 6.0  5.0 - 8.0   Glucose, UA NEGATIVE  NEGATIVE mg/dL   Hgb urine dipstick TRACE (*) NEGATIVE   Bilirubin Urine NEGATIVE  NEGATIVE   Ketones, ur NEGATIVE  NEGATIVE mg/dL   Protein, ur NEGATIVE  NEGATIVE mg/dL   Urobilinogen, UA 1.0  0.0 - 1.0 mg/dL   Nitrite  NEGATIVE  NEGATIVE   Leukocytes, UA NEGATIVE  NEGATIVE  URINE MICROSCOPIC-ADD ON     Status: None   Collection Time    02/25/13  4:02 PM      Result Value Range   Squamous Epithelial / LPF RARE  RARE   WBC, UA 0-2  <3 WBC/hpf   RBC / HPF 0-2  <3 RBC/hpf   Dg Chest 2 View  02/25/2013   *RADIOLOGY REPORT*  Clinical Data: Shortness of breath, abdominal pain, smoking history  CHEST - 2 VIEW  Comparison: None.  Findings: Mild peribronchial thickening and slightly prominent interstitial markings most likely are related to the patient's smoking history.  No active infiltrate or effusion is seen. Mediastinal contours appear normal.  The heart is within upper limits of normal.  There are degenerative changes in the lower thoracic spine.  IMPRESSION: No active lung disease.  Probable chronic changes as noted above.   Original Report Authenticated By: Dwyane Dee, M.D.   Ct Abdomen Pelvis W Contrast  02/25/2013   *RADIOLOGY REPORT*  Clinical Data: Lower abdominal pain.  Constipation and loss of appetite.  CT ABDOMEN AND PELVIS WITH CONTRAST  Technique:  Multidetector CT imaging of the abdomen and pelvis was performed following the standard protocol during bolus administration of intravenous contrast.  Contrast: 30mL OMNIPAQUE IOHEXOL 300 MG/ML  SOLN,  OMNIPAQUE IOHEXOL 300 MG/ML  SOLN  Comparison: None.  Findings: Lung bases show scattered scarring.  The left anterior descending coronary artery calcification.  Heart size normal.  No pericardial or pleural effusion.  Liver measures 20.5 cm.  Liver, gallbladder, adrenal glands, kidneys, spleen, pancreas, stomach and small bowel are unremarkable.  Appendix is dilated and fluid-filled, measuring 10 mm in diameter. There is an adjacent fluid collection, measuring 2.6 x 3.5 cm (image 73).  Surrounding inflammatory changes are seen in the right lower quadrant with lymph nodes measuring up to 9 mm in short axis. Colon is otherwise unremarkable.  Small bilateral inguinal hernias contain fat.  No pathologically enlarged lymph nodes.  No pelvic free fluid.  Mild presacral edema. Atherosclerotic calcification of the arterial vasculature without abdominal aortic aneurysm.  No worrisome lytic or sclerotic lesions.  Degenerative changes are seen in the spine.  IMPRESSION:  1.  Changes of appendicitis with a small periappendiceal abscess. These results were called by telephone on 02/25/2013 at 1403 hours to Dr. Nehemiah Settle, who verbally acknowledged these results. 2.  Hepatomegaly. 3.  Coronary artery calcification.   Original Report Authenticated By: Leanna Battles, M.D.    Review of Systems  Constitutional: Positive for malaise/fatigue.  Respiratory: Negative for cough, shortness of breath and wheezing.   Cardiovascular: Negative for chest pain and palpitations.  Neurological: Positive for weakness.  All other systems reviewed and are negative.    Blood pressure 116/68, pulse 73, temperature 98.2 F (36.8 C), temperature source Oral, resp. rate 18, SpO2 94.00%. Physical Exam  Constitutional: She is oriented to person, place, and time. She appears well-developed and well-nourished. No distress.  HENT:  Head: Normocephalic and atraumatic.  Right Ear: External ear normal.  Left Ear: External ear normal.   Nose: Nose normal.  Mouth/Throat: Oropharynx is clear and moist.  Eyes: Conjunctivae are normal. Pupils are equal, round, and reactive to light. Right eye exhibits no discharge. Left eye exhibits no discharge. No scleral icterus.  Neck: Normal range of motion. Neck supple. No tracheal deviation present. No thyromegaly present.  Cardiovascular: Normal rate, regular rhythm, normal heart sounds and intact distal pulses.  No murmur heard. Respiratory: Effort normal and breath sounds normal. No respiratory distress. She has no wheezes.  GI: Soft. Bowel sounds are normal. There is tenderness. There is guarding.  There is moderate tenderness with guarding in the right lower quadrant  Musculoskeletal: Normal range of motion. She exhibits no edema and no tenderness.  Lymphadenopathy:    She has no cervical adenopathy.  Neurological: She is alert and oriented to person, place, and time.  Skin: Skin is warm and dry. No rash noted. No erythema.  Psychiatric: Her behavior is normal. Judgment normal.     Assessment/Plan Perforated appendicitis with abscess  Appendectomy is recommended. I discussed this with the patient and her husband. I will attempt this laparoscopically. I discussed the risks which includes but is not limited to bleeding, infection, need for conversion to a known procedure, injury to surrounding structures including the ureter, need for drains, ongoing infection, need for further surgery, et Karie Soda. She understands and wishes to proceed. Surgery is scheduled urgently.  Jason Frisbee A 02/25/2013, 6:34 PM

## 2013-02-25 NOTE — Anesthesia Postprocedure Evaluation (Signed)
Anesthesia Post Note  Patient: Mariah Schultz  Procedure(s) Performed: Procedure(s) (LRB): APPENDECTOMY LAPAROSCOPIC (N/A)  Anesthesia type: general  Patient location: PACU  Post pain: Pain level controlled  Post assessment: Patient's Cardiovascular Status Stable  Last Vitals:  Filed Vitals:   02/25/13 2330  BP:   Pulse: 76  Temp:   Resp: 20    Post vital signs: Reviewed and stable  Level of consciousness: sedated  Complications: No apparent anesthesia complications

## 2013-02-26 LAB — BASIC METABOLIC PANEL
CO2: 28 mEq/L (ref 19–32)
Calcium: 8.6 mg/dL (ref 8.4–10.5)
GFR calc non Af Amer: >90 mL/min (ref >90)
Glucose, Bld: 115 mg/dL — ABNORMAL HIGH (ref 70–99)
Potassium: 3.6 mEq/L (ref 3.5–5.1)
Sodium: 134 mEq/L — ABNORMAL LOW (ref 135–145)

## 2013-02-26 MED ORDER — SODIUM CHLORIDE 0.9 % IV SOLN
1.0000 g | INTRAVENOUS | Status: DC
  Administered 2013-02-26: 1 g via INTRAVENOUS
  Filled 2013-02-26 (×2): qty 1

## 2013-02-26 MED ORDER — ONDANSETRON HCL 4 MG/2ML IJ SOLN: 4.0000 mg | Freq: Four times a day (QID) | INTRAMUSCULAR | Status: DC | PRN

## 2013-02-26 MED ORDER — POTASSIUM CHLORIDE IN NACL 20-0.9 MEQ/L-% IV SOLN
INTRAVENOUS | Status: DC
  Administered 2013-02-26 – 2013-02-27 (×3): via INTRAVENOUS
  Filled 2013-02-26 (×6): qty 1000

## 2013-02-26 MED ORDER — HYDROMORPHONE HCL PF 1 MG/ML IJ SOLN: 0.2500 mg | INTRAMUSCULAR | Status: DC | PRN

## 2013-02-26 MED ORDER — METOCLOPRAMIDE HCL 5 MG/ML IJ SOLN: 10.0000 mg | Freq: Once | INTRAMUSCULAR | Status: AC | PRN

## 2013-02-26 MED ORDER — IBUPROFEN 600 MG PO TABS: 600.0000 mg | ORAL_TABLET | Freq: Four times a day (QID) | ORAL | Status: DC | PRN

## 2013-02-26 MED ORDER — ONDANSETRON HCL 4 MG PO TABS: 4.0000 mg | ORAL_TABLET | Freq: Four times a day (QID) | ORAL | Status: DC | PRN

## 2013-02-26 MED ORDER — DOCUSATE SODIUM 100 MG PO CAPS
100.0000 mg | ORAL_CAPSULE | Freq: Two times a day (BID) | ORAL | Status: DC | PRN
  Administered 2013-02-26: 100 mg via ORAL
  Filled 2013-02-26: qty 1

## 2013-02-26 MED ORDER — OXYCODONE HCL 5 MG PO TABS: 5.0000 mg | ORAL_TABLET | Freq: Once | ORAL | Status: AC | PRN

## 2013-02-26 MED ORDER — ENOXAPARIN SODIUM 40 MG/0.4ML ~~LOC~~ SOLN
40.0000 mg | SUBCUTANEOUS | Status: DC
  Administered 2013-02-26: 40 mg via SUBCUTANEOUS
  Filled 2013-02-26 (×2): qty 0.4

## 2013-02-26 MED ORDER — OXYCODONE HCL 5 MG/5ML PO SOLN: 5.0000 mg | Freq: Once | ORAL | Status: AC | PRN

## 2013-02-26 MED ORDER — MORPHINE SULFATE 4 MG/ML IJ SOLN
4.0000 mg | INTRAMUSCULAR | Status: DC | PRN
  Administered 2013-02-26 (×2): 4 mg via INTRAVENOUS
  Filled 2013-02-26 (×2): qty 1

## 2013-02-26 MED ORDER — BISACODYL 10 MG RE SUPP: 10.0000 mg | Freq: Every day | RECTAL | Status: DC | PRN

## 2013-02-26 MED ORDER — OXYCODONE-ACETAMINOPHEN 5-325 MG PO TABS
1.0000 | ORAL_TABLET | ORAL | Status: DC | PRN
  Administered 2013-02-26 – 2013-02-27 (×3): 2 via ORAL
  Filled 2013-02-26 (×3): qty 2

## 2013-02-26 MED ORDER — HYDROCHLOROTHIAZIDE 25 MG PO TABS
25.0000 mg | ORAL_TABLET | Freq: Every day | ORAL | Status: DC
  Filled 2013-02-26 (×2): qty 1

## 2013-02-26 NOTE — Progress Notes (Signed)
1 Day Post-Op  Subjective: Pt feels okay, still in pain.  Not ambulated much yet.  No N/V.  Urinating well, no flatus or BM yet.  Abdomen feels bloated.  Tolerating clears.  Wants to eat more.  Objective: Vital signs in last 24 hours: Temp:  [97 F (36.1 C)-98.5 F (36.9 C)] 98.3 F (36.8 C) (06/24 0529) Pulse Rate:  [68-80] 68 (06/24 0529) Resp:  [16-27] 16 (06/24 0529) BP: (108-133)/(52-82) 122/82 mmHg (06/24 0529) SpO2:  [91 %-100 %] 96 % (06/24 0529) Weight:  [188 lb 4.4 oz (85.4 kg)] 188 lb 4.4 oz (85.4 kg) (06/24 0020)    Intake/Output from previous day: 06/23 0701 - 06/24 0700 In: 1566.3 [P.O.:100; I.V.:1466.3] Out: 160 [Urine:150; Blood:10] Intake/Output this shift:    PE: Gen:  Alert, NAD, pleasant Abd: Soft, appropriately tender, mild distension, +BS, no HSM, incisions C/D/I   Lab Results:   Recent Labs  02/25/13 1545  WBC 19.7*  HGB 15.8*  HCT 44.0  PLT 268   BMET  Recent Labs  02/25/13 1545 02/26/13 0545  NA 130* 134*  K 3.2* 3.6  CL 89* 96  CO2 29 28  GLUCOSE 104* 115*  BUN 13 10  CREATININE 0.83 0.67  CALCIUM 9.3 8.6   PT/INR No results found for this basename: LABPROT, INR,  in the last 72 hours CMP     Component Value Date/Time   NA 134* 02/26/2013 0545   K 3.6 02/26/2013 0545   CL 96 02/26/2013 0545   CO2 28 02/26/2013 0545   GLUCOSE 115* 02/26/2013 0545   BUN 10 02/26/2013 0545   CREATININE 0.67 02/26/2013 0545   CALCIUM 8.6 02/26/2013 0545   GFRNONAA >90 02/26/2013 0545   GFRAA >90 02/26/2013 0545   Lipase  No results found for this basename: lipase       Studies/Results: Dg Chest 2 View  02/25/2013   *RADIOLOGY REPORT*  Clinical Data: Shortness of breath, abdominal pain, smoking history  CHEST - 2 VIEW  Comparison: None.  Findings: Mild peribronchial thickening and slightly prominent interstitial markings most likely are related to the patient's smoking history.  No active infiltrate or effusion is seen. Mediastinal contours  appear normal.  The heart is within upper limits of normal.  There are degenerative changes in the lower thoracic spine.  IMPRESSION: No active lung disease.  Probable chronic changes as noted above.   Original Report Authenticated By: Dwyane Dee, M.D.   Ct Abdomen Pelvis W Contrast  02/25/2013   *RADIOLOGY REPORT*  Clinical Data: Lower abdominal pain.  Constipation and loss of appetite.  CT ABDOMEN AND PELVIS WITH CONTRAST  Technique:  Multidetector CT imaging of the abdomen and pelvis was performed following the standard protocol during bolus administration of intravenous contrast.  Contrast: 30mL OMNIPAQUE IOHEXOL 300 MG/ML  SOLN, OMNIPAQUE IOHEXOL 300 MG/ML  SOLN  Comparison: None.  Findings: Lung bases show scattered scarring.  The left anterior descending coronary artery calcification.  Heart size normal.  No pericardial or pleural effusion.  Liver measures 20.5 cm.  Liver, gallbladder, adrenal glands, kidneys, spleen, pancreas, stomach and small bowel are unremarkable.  Appendix is dilated and fluid-filled, measuring 10 mm in diameter. There is an adjacent fluid collection, measuring 2.6 x 3.5 cm (image 73).  Surrounding inflammatory changes are seen in the right lower quadrant with lymph nodes measuring up to 9 mm in short axis. Colon is otherwise unremarkable.  Small bilateral inguinal hernias contain fat.  No pathologically enlarged lymph nodes.  No pelvic free fluid.  Mild presacral edema. Atherosclerotic calcification of the arterial vasculature without abdominal aortic aneurysm.  No worrisome lytic or sclerotic lesions.  Degenerative changes are seen in the spine.  IMPRESSION:  1.  Changes of appendicitis with a small periappendiceal abscess. These results were called by telephone on 02/25/2013 at 1403 hours to Dr. Nehemiah Settle, who verbally acknowledged these results. 2.  Hepatomegaly. 3.  Coronary artery calcification.   Original Report Authenticated By: Leanna Battles, M.D.     Anti-infectives: Anti-infectives   Start     Dose/Rate Route Frequency Ordered Stop   02/26/13 2100  ertapenem [facility]) 1 g in sodium chloride 0.9 % 50 mL IVPB     1 g 100 mL/hr over 30 Minutes Intravenous Every 24 hours 02/26/13 0034     02/25/13 1900  ertapenem (INVANZ) 1 g in sodium chloride 0.9 % 50 mL IVPB     1 g 100 mL/hr over 30 Minutes Intravenous On call to O.R. 02/25/13 1833 02/25/13 2120       Assessment/Plan Perforated appendicitis, POD #1 s/p lap appy 1.  Pain improving, but still an issue 2.  Advance to soft diet 3.  Ambulate and IS 4.  SCD's and lovenox 5.  Pt can go home when tolerating diet, ambulating, pain well controlled    LOS: 1 day    DORT, Tymeka Privette 02/26/2013, 8:49 AM Pager: [phone]

## 2013-02-26 NOTE — Progress Notes (Signed)
Doing okay.  Probably able to go home tomorrow.  [name]. Gae Bon, MD, FACS [phone] [facility]

## 2013-02-26 NOTE — Progress Notes (Signed)
UR COMPLETED  

## 2013-02-27 ENCOUNTER — Encounter (HOSPITAL_COMMUNITY): Payer: Self-pay | Admitting: Surgery

## 2013-02-27 LAB — CBC
HCT: 38.5 % (ref 36.0–46.0)
Hemoglobin: 13 g/dL (ref 12.0–15.0)
MCH: 30.1 pg (ref 26.0–34.0)
MCV: 89.1 fL (ref 78.0–100.0)
RBC: 4.32 MIL/uL (ref 3.87–5.11)

## 2013-02-27 LAB — BASIC METABOLIC PANEL
CO2: 29 mEq/L (ref 19–32)
Chloride: 99 mEq/L (ref 96–112)
GFR calc non Af Amer: >90 mL/min (ref >90)
Glucose, Bld: 114 mg/dL — ABNORMAL HIGH (ref 70–99)
Potassium: 3.8 mEq/L (ref 3.5–5.1)
Sodium: 135 mEq/L (ref 135–145)

## 2013-02-27 MED ORDER — CIPROFLOXACIN HCL 500 MG PO TABS: 500.0000 mg | ORAL_TABLET | Freq: Two times a day (BID) | ORAL | Status: DC

## 2013-02-27 MED ORDER — OXYCODONE-ACETAMINOPHEN 5-325 MG PO TABS
1.0000 | ORAL_TABLET | ORAL | Status: DC | PRN
Start: 2013-02-27 — End: 2013-03-19

## 2013-02-27 MED ORDER — METRONIDAZOLE 500 MG PO TABS: 500.0000 mg | ORAL_TABLET | Freq: Three times a day (TID) | ORAL | Status: DC

## 2013-02-27 NOTE — ED Provider Notes (Signed)
Medical screening examination/treatment/procedure(s) were conducted as a shared visit with non-physician practitioner(s) and myself.  I personally evaluated the patient during the encounter  Flint Melter, MD 02/27/13 1349

## 2013-02-27 NOTE — Discharge Summary (Signed)
Physician Discharge Summary  Patient ID: Mariah Schultz MRN: [id number] DOB/AGE: 10-14-1955 57 y.o.  Admit date: 02/25/2013 Discharge date: 02/27/2013  Admitting Diagnosis: RLQ Abdominal Pain Perforated Appendicitis Leukocytosis  Discharge Diagnosis Perforated Appendicitis  Consultants None  Imaging: Dg Chest 2 View  02/25/2013   *RADIOLOGY REPORT*  Clinical Data: Shortness of breath, abdominal pain, smoking history  CHEST - 2 VIEW  Comparison: None.  Findings: Mild peribronchial thickening and slightly prominent interstitial markings most likely are related to the patient's smoking history.  No active infiltrate or effusion is seen. Mediastinal contours appear normal.  The heart is within upper limits of normal.  There are degenerative changes in the lower thoracic spine.  IMPRESSION: No active lung disease.  Probable chronic changes as noted above.   Original Report Authenticated By: Dwyane Dee, M.D.   Ct Abdomen Pelvis W Contrast  02/25/2013   *RADIOLOGY REPORT*  Clinical Data: Lower abdominal pain.  Constipation and loss of appetite.  CT ABDOMEN AND PELVIS WITH CONTRAST  Technique:  Multidetector CT imaging of the abdomen and pelvis was performed following the standard protocol during bolus administration of intravenous contrast.  Contrast: 30mL OMNIPAQUE IOHEXOL 300 MG/ML  SOLN, OMNIPAQUE IOHEXOL 300 MG/ML  SOLN  Comparison: None.  Findings: Lung bases show scattered scarring.  The left anterior descending coronary artery calcification.  Heart size normal.  No pericardial or pleural effusion.  Liver measures 20.5 cm.  Liver, gallbladder, adrenal glands, kidneys, spleen, pancreas, stomach and small bowel are unremarkable.  Appendix is dilated and fluid-filled, measuring 10 mm in diameter. There is an adjacent fluid collection, measuring 2.6 x 3.5 cm (image 73).  Surrounding inflammatory changes are seen in the right lower quadrant with lymph nodes measuring up to 9 mm in short  axis. Colon is otherwise unremarkable.  Small bilateral inguinal hernias contain fat.  No pathologically enlarged lymph nodes.  No pelvic free fluid.  Mild presacral edema. Atherosclerotic calcification of the arterial vasculature without abdominal aortic aneurysm.  No worrisome lytic or sclerotic lesions.  Degenerative changes are seen in the spine.  IMPRESSION:  1.  Changes of appendicitis with a small periappendiceal abscess. These results were called by telephone on 02/25/2013 at 1403 hours to Dr. Nehemiah Settle, who verbally acknowledged these results. 2.  Hepatomegaly. 3.  Coronary artery calcification.   Original Report Authenticated By: Leanna Battles, M.D.    Procedures Dr. Magnus Ivan (02/25/13) - Laparoscopic Appendectomy  Hospital Course:  57-year-old female who presented to [facility] with RLQ pain, fatigue, malaise, and anorexia.  Workup showed tenderness and guarding to her abdominal right lower quadrant.  CT of the abdomen and pelvis revealed changes of appendicitis with a small periappendiceal abscess. Patient was admitted and underwent procedure listed above.  Tolerated procedure well and was transferred to the floor.  Diet was advanced as tolerated.  On POD 1 the patient's pain was not quite controlled, which is why she stayed another day for observation. On POD 2, the patient was voiding well, tolerating diet, ambulating well, pain well controlled, vital signs stable, incisions c/d/i and felt stable for discharge home.  Patient will follow up in our office in 3 weeks and knows to call with questions or concerns.  Physical Exam: General:  Alert, NAD, pleasant, comfortable Abd:  incisions C/D/I    Medication List    TAKE these medications       aspirin 81 MG tablet  Take 81 mg by mouth daily.     ciprofloxacin 500 MG tablet  Commonly known as:  CIPRO  Take 1 tablet (500 mg total) by mouth 2 (two) times daily.     hydrochlorothiazide 25 MG tablet  Commonly known as:  HYDRODIURIL  Take 25 mg by mouth  daily.     metroNIDAZOLE 500 MG tablet  Commonly known as:  FLAGYL  Take 1 tablet (500 mg total) by mouth 3 (three) times daily.     oxyCODONE-acetaminophen 5-325 MG per tablet  Commonly known as:  PERCOCET/ROXICET  Take 1-2 tablets by mouth every 4 (four) hours as needed.         Follow-up Information   Follow up with [facility] On 03/19/2013. (appt at 12:00pm, please arrive at 11:30am for check in)    Contact information:   [address]   [place] [address] [phone]       Signed: Cephus Shelling [facility] [phone]  02/27/2013, 10:19 AM

## 2013-02-27 NOTE — Progress Notes (Signed)
2 Days Post-Op  Subjective: Denies flatus No nausea, but feels slightly bloated  Objective: Vital signs in last 24 hours: Temp:  [98 F (36.7 C)-99.2 F (37.3 C)] 99.2 F (37.3 C) (06/25 0537) Pulse Rate:  [67-78] 74 (06/25 0537) Resp:  [16-19] 16 (06/25 0537) BP: (106-131)/(61-77) 131/61 mmHg (06/25 0537) SpO2:  [91 %-94 %] 94 % (06/25 0537) Last BM Date: 02/26/13  Intake/Output from previous day: 06/24 0701 - 06/25 0700 In: 1874 [I.V.:1824; IV Piggyback:50] Out: 1500 [Urine:1500] Intake/Output this shift:    Abdomen soft but distended Mildly tender RLQ  Lab Results:   Recent Labs  02/25/13 1545  WBC 19.7*  HGB 15.8*  HCT 44.0  PLT 268   BMET  Recent Labs  02/25/13 1545 02/26/13 0545  NA 130* 134*  K 3.2* 3.6  CL 89* 96  CO2 29 28  GLUCOSE 104* 115*  BUN 13 10  CREATININE 0.83 0.67  CALCIUM 9.3 8.6   PT/INR No results found for this basename: LABPROT, INR,  in the last 72 hours ABG No results found for this basename: PHART, PCO2, PO2, HCO3,  in the last 72 hours  Studies/Results: Dg Chest 2 View  02/25/2013   *RADIOLOGY REPORT*  Clinical Data: Shortness of breath, abdominal pain, smoking history  CHEST - 2 VIEW  Comparison: None.  Findings: Mild peribronchial thickening and slightly prominent interstitial markings most likely are related to the patient's smoking history.  No active infiltrate or effusion is seen. Mediastinal contours appear normal.  The heart is within upper limits of normal.  There are degenerative changes in the lower thoracic spine.  IMPRESSION: No active lung disease.  Probable chronic changes as noted above.   Original Report Authenticated By: Dwyane Dee, M.D.   Ct Abdomen Pelvis W Contrast  02/25/2013   *RADIOLOGY REPORT*  Clinical Data: Lower abdominal pain.  Constipation and loss of appetite.  CT ABDOMEN AND PELVIS WITH CONTRAST  Technique:  Multidetector CT imaging of the abdomen and pelvis was performed following the standard  protocol during bolus administration of intravenous contrast.  Contrast: 30mL OMNIPAQUE IOHEXOL 300 MG/ML  SOLN, OMNIPAQUE IOHEXOL 300 MG/ML  SOLN  Comparison: None.  Findings: Lung bases show scattered scarring.  The left anterior descending coronary artery calcification.  Heart size normal.  No pericardial or pleural effusion.  Liver measures 20.5 cm.  Liver, gallbladder, adrenal glands, kidneys, spleen, pancreas, stomach and small bowel are unremarkable.  Appendix is dilated and fluid-filled, measuring 10 mm in diameter. There is an adjacent fluid collection, measuring 2.6 x 3.5 cm (image 73).  Surrounding inflammatory changes are seen in the right lower quadrant with lymph nodes measuring up to 9 mm in short axis. Colon is otherwise unremarkable.  Small bilateral inguinal hernias contain fat.  No pathologically enlarged lymph nodes.  No pelvic free fluid.  Mild presacral edema. Atherosclerotic calcification of the arterial vasculature without abdominal aortic aneurysm.  No worrisome lytic or sclerotic lesions.  Degenerative changes are seen in the spine.  IMPRESSION:  1.  Changes of appendicitis with a small periappendiceal abscess. These results were called by telephone on 02/25/2013 at 1403 hours to Dr. Nehemiah Settle, who verbally acknowledged these results. 2.  Hepatomegaly. 3.  Coronary artery calcification.   Original Report Authenticated By: Leanna Battles, M.D.    Anti-infectives: Anti-infectives   Start     Dose/Rate Route Frequency Ordered Stop   02/26/13 2100  ertapenem (INVANZ) 1 g in sodium chloride 0.9 % 50 mL IVPB  1 g 100 mL/hr over 30 Minutes Intravenous Every 24 hours 02/26/13 0034     02/25/13 1900  ertapenem (INVANZ) 1 g in sodium chloride 0.9 % 50 mL IVPB     1 g 100 mL/hr over 30 Minutes Intravenous On call to O.R. 02/25/13 1833 02/25/13 2120      Assessment/Plan: s/p Procedure(s): APPENDECTOMY LAPAROSCOPIC (N/A)  Ambulate Continue antibiotics Check labs in the  morning Keep on liquids  LOS: 2 days    Theta Leaf A 02/27/2013

## 2013-03-19 ENCOUNTER — Encounter (INDEPENDENT_AMBULATORY_CARE_PROVIDER_SITE_OTHER): Payer: Self-pay | Admitting: Internal Medicine

## 2013-03-19 ENCOUNTER — Ambulatory Visit (INDEPENDENT_AMBULATORY_CARE_PROVIDER_SITE_OTHER): Payer: BC Managed Care – PPO | Admitting: Internal Medicine

## 2013-03-19 VITALS — BP 138/88 | HR 74 | Temp 98.2°F | Resp 14 | Ht 66.0 in | Wt 175.8 lb

## 2013-03-19 DIAGNOSIS — K35209 Acute appendicitis with generalized peritonitis, without abscess, unspecified as to perforation: Secondary | ICD-10-CM

## 2013-03-19 DIAGNOSIS — K3532 Acute appendicitis with perforation and localized peritonitis, without abscess: Secondary | ICD-10-CM

## 2013-03-19 DIAGNOSIS — K352 Acute appendicitis with generalized peritonitis, without abscess: Secondary | ICD-10-CM

## 2013-03-19 HISTORY — DX: Acute appendicitis with perforation, localized peritonitis, and gangrene, without abscess: K35.32

## 2013-03-19 NOTE — Progress Notes (Signed)
  Subjective: Pt returns to the clinic today after undergoing laparoscopic appendectomy on 02/25/13 by Dr. Magnus Ivan.  The patient is tolerating their diet well and is having no severe pain.  Bowel function is good.  No problems with the wounds.  She is having some left lower quadrant pain that is not severe but just started in the last 2-3 days.  It is noticeable.  It is not associated with n/v, constipation, diarrhea, fever, chills or other symptoms.   Objective: Vital signs in last 24 hours: Reviewed  PE: Abd: soft, non-tender, +bs, incisions well healed  Lab Results:  No results found for this basename: WBC, HGB, HCT, PLT,  in the last 72 hours BMET No results found for this basename: NA, K, CL, CO2, GLUCOSE, BUN, CREATININE, CALCIUM,  in the last 72 hours PT/INR No results found for this basename: LABPROT, INR,  in the last 72 hours CMP     Component Value Date/Time   NA 135 02/27/2013 1144   K 3.8 02/27/2013 1144   CL 99 02/27/2013 1144   CO2 29 02/27/2013 1144   GLUCOSE 114* 02/27/2013 1144   BUN 7 02/27/2013 1144   CREATININE 0.70 02/27/2013 1144   CALCIUM 8.4 02/27/2013 1144   GFRNONAA >90 02/27/2013 1144   GFRAA >90 02/27/2013 1144   Lipase  No results found for this basename: lipase       Studies/Results: No results found.  Anti-infectives: Anti-infectives   None       Assessment/Plan  1.  S/P Laparoscopic Appendectomy: doing well overall, may resume regular activity without restrictions, pt will contact us in one week if her symptoms are not improving or if they worsen and we will order a CT of the abdomen and pelvis with contrast, otherwise follow up with [place] [facility] and knows to call with questions or concerns.     WHITE, ELIZABETH 03/19/2013

## 2013-03-19 NOTE — Patient Instructions (Addendum)
May resume regular activity without restrictions. Pt will call in one week if her symptoms are not improving and we will order a CT of the abdomen and pelvis with contrast. Otherwise she will Follow up as needed. Call with questions or concerns.

## 2013-09-10 ENCOUNTER — Inpatient Hospital Stay (HOSPITAL_COMMUNITY)
Admission: EM | Admit: 2013-09-10 | Discharge: 2013-09-11 | DRG: 195 | Disposition: A | Discharge status: Home / Self Care | Payer: BC Managed Care – PPO | Attending: Internal Medicine | Admitting: Internal Medicine

## 2013-09-10 ENCOUNTER — Emergency Department (HOSPITAL_COMMUNITY): Payer: BC Managed Care – PPO

## 2013-09-10 ENCOUNTER — Encounter (HOSPITAL_COMMUNITY): Payer: Self-pay | Admitting: Emergency Medicine

## 2013-09-10 DIAGNOSIS — E876 Hypokalemia: Secondary | ICD-10-CM | POA: Diagnosis present

## 2013-09-10 DIAGNOSIS — Z8249 Family history of ischemic heart disease and other diseases of the circulatory system: Secondary | ICD-10-CM

## 2013-09-10 DIAGNOSIS — M25519 Pain in unspecified shoulder: Secondary | ICD-10-CM | POA: Diagnosis present

## 2013-09-10 DIAGNOSIS — S46812A Strain of other muscles, fascia and tendons at shoulder and upper arm level, left arm, initial encounter: Secondary | ICD-10-CM

## 2013-09-10 DIAGNOSIS — M25512 Pain in left shoulder: Secondary | ICD-10-CM | POA: Diagnosis present

## 2013-09-10 DIAGNOSIS — R0902 Hypoxemia: Secondary | ICD-10-CM

## 2013-09-10 DIAGNOSIS — F17200 Nicotine dependence, unspecified, uncomplicated: Secondary | ICD-10-CM | POA: Diagnosis present

## 2013-09-10 DIAGNOSIS — J189 Pneumonia, unspecified organism: Secondary | ICD-10-CM

## 2013-09-10 DIAGNOSIS — I10 Essential (primary) hypertension: Secondary | ICD-10-CM | POA: Diagnosis present

## 2013-09-10 HISTORY — DX: Pneumonia, unspecified organism: J18.9

## 2013-09-10 LAB — CBC
HEMATOCRIT: 44.4 % (ref 36.0–46.0)
HEMOGLOBIN: 15.4 g/dL — AB (ref 12.0–15.0)
MCH: 30.7 pg (ref 26.0–34.0)
MCHC: 34.7 g/dL (ref 30.0–36.0)
MCV: 88.6 fL (ref 78.0–100.0)
Platelets: 264 10*3/uL (ref 150–400)
RBC: 5.01 MIL/uL (ref 3.87–5.11)
RDW: 14.1 % (ref 11.5–15.5)
WBC: 6.6 10*3/uL (ref 4.0–10.5)

## 2013-09-10 LAB — POCT I-STAT TROPONIN I: TROPONIN I, POC: 0.01 ng/mL (ref 0.00–0.08)

## 2013-09-10 LAB — BASIC METABOLIC PANEL
BUN: 16 mg/dL (ref 6–23)
CHLORIDE: 98 meq/L (ref 96–112)
CO2: 27 mEq/L (ref 19–32)
CREATININE: 0.84 mg/dL (ref 0.50–1.10)
Calcium: 8.6 mg/dL (ref 8.4–10.5)
GFR, EST AFRICAN AMERICAN: 86 mL/min — AB (ref >90)
GFR, EST NON AFRICAN AMERICAN: 75 mL/min — AB (ref >90)
Glucose, Bld: 104 mg/dL — ABNORMAL HIGH (ref 70–99)
POTASSIUM: 3.3 meq/L — AB (ref 3.7–5.3)
Sodium: 140 mEq/L (ref 137–147)

## 2013-09-10 LAB — TROPONIN I: Troponin I: <0.3 ng/mL (ref <0.30)

## 2013-09-10 MED ORDER — POTASSIUM CHLORIDE CRYS ER 20 MEQ PO TBCR
40.0000 meq | EXTENDED_RELEASE_TABLET | Freq: Once | ORAL | Status: AC
  Administered 2013-09-10: 40 meq via ORAL
  Filled 2013-09-10: qty 2

## 2013-09-10 MED ORDER — ALBUTEROL SULFATE (2.5 MG/3ML) 0.083% IN NEBU: 2.5000 mg | INHALATION_SOLUTION | RESPIRATORY_TRACT | Status: DC | PRN

## 2013-09-10 MED ORDER — HYDROCHLOROTHIAZIDE 25 MG PO TABS: 25.0000 mg | ORAL_TABLET | Freq: Every day | ORAL | Status: DC

## 2013-09-10 MED ORDER — WOMENS MULTI VITAMIN & MINERAL PO TABS: 1.0000 | ORAL_TABLET | Freq: Every morning | ORAL | Status: DC

## 2013-09-10 MED ORDER — DEXTROSE 5 % IV SOLN
1.0000 g | INTRAVENOUS | Status: DC
Start: 2013-09-11 — End: 2013-09-11
  Filled 2013-09-10: qty 10

## 2013-09-10 MED ORDER — DEXTROSE 5 % IV SOLN: 500.0000 mg | INTRAVENOUS | Status: DC

## 2013-09-10 MED ORDER — ASPIRIN 81 MG PO TABS: 81.0000 mg | ORAL_TABLET | Freq: Every day | ORAL | Status: DC

## 2013-09-10 MED ORDER — DEXTROSE 5 % IV SOLN: 1.0000 g | INTRAVENOUS | Status: DC

## 2013-09-10 MED ORDER — KETOROLAC TROMETHAMINE 60 MG/2ML IM SOLN
60.0000 mg | Freq: Once | INTRAMUSCULAR | Status: AC
  Administered 2013-09-10: 60 mg via INTRAMUSCULAR
  Filled 2013-09-10: qty 2

## 2013-09-10 MED ORDER — ASPIRIN 81 MG PO CHEW
81.0000 mg | CHEWABLE_TABLET | Freq: Every day | ORAL | Status: DC
  Administered 2013-09-11: 81 mg via ORAL
  Filled 2013-09-10: qty 1

## 2013-09-10 MED ORDER — ADULT MULTIVITAMIN W/MINERALS CH
1.0000 | ORAL_TABLET | Freq: Every day | ORAL | Status: DC
  Administered 2013-09-11: 1 via ORAL
  Filled 2013-09-10: qty 1

## 2013-09-10 MED ORDER — DEXTROSE 5 % IV SOLN
500.0000 mg | Freq: Once | INTRAVENOUS | Status: AC
  Administered 2013-09-10: 500 mg via INTRAVENOUS

## 2013-09-10 MED ORDER — FENTANYL CITRATE 0.05 MG/ML IJ SOLN
50.0000 ug | Freq: Once | INTRAMUSCULAR | Status: AC
  Administered 2013-09-10: 50 ug via INTRAVENOUS
  Filled 2013-09-10: qty 2

## 2013-09-10 MED ORDER — CEFTRIAXONE SODIUM 1 G IJ SOLR
1.0000 g | Freq: Once | INTRAMUSCULAR | Status: AC
  Administered 2013-09-10: 1 g via INTRAMUSCULAR
  Filled 2013-09-10: qty 10

## 2013-09-10 MED ORDER — SPIRONOLACTONE-HCTZ 25-25 MG PO TABS
1.0000 | ORAL_TABLET | Freq: Every day | ORAL | Status: DC
  Administered 2013-09-11: 1 via ORAL
  Filled 2013-09-10: qty 1

## 2013-09-10 MED ORDER — HEPARIN SODIUM (PORCINE) 5000 UNIT/ML IJ SOLN
5000.0000 [IU] | Freq: Three times a day (TID) | INTRAMUSCULAR | Status: DC
  Administered 2013-09-10 – 2013-09-11 (×2): 5000 [IU] via SUBCUTANEOUS
  Filled 2013-09-10 (×5): qty 1

## 2013-09-10 MED ORDER — HYDROCODONE-ACETAMINOPHEN 5-325 MG PO TABS
1.0000 | ORAL_TABLET | Freq: Four times a day (QID) | ORAL | Status: DC | PRN
  Administered 2013-09-11 (×2): 1 via ORAL
  Filled 2013-09-10 (×2): qty 1

## 2013-09-10 MED ORDER — ALBUTEROL SULFATE (2.5 MG/3ML) 0.083% IN NEBU
2.5000 mg | INHALATION_SOLUTION | RESPIRATORY_TRACT | Status: DC
  Administered 2013-09-11: 2.5 mg via RESPIRATORY_TRACT
  Filled 2013-09-10: qty 3

## 2013-09-10 MED ORDER — METHYLPREDNISOLONE SODIUM SUCC 125 MG IJ SOLR
125.0000 mg | Freq: Three times a day (TID) | INTRAMUSCULAR | Status: DC
  Administered 2013-09-10 – 2013-09-11 (×2): 125 mg via INTRAMUSCULAR
  Filled 2013-09-10 (×5): qty 2

## 2013-09-10 MED ORDER — CYCLOBENZAPRINE HCL 10 MG PO TABS
10.0000 mg | ORAL_TABLET | Freq: Once | ORAL | Status: AC
  Administered 2013-09-10: 10 mg via ORAL
  Filled 2013-09-10: qty 1

## 2013-09-10 MED ORDER — LIDOCAINE HCL (PF) 1 % IJ SOLN
INTRAMUSCULAR | Status: AC
  Administered 2013-09-10: 2.1 mL
  Filled 2013-09-10: qty 5

## 2013-09-10 MED ORDER — AZITHROMYCIN 500 MG IV SOLR
500.0000 mg | INTRAVENOUS | Status: DC
  Filled 2013-09-10: qty 500

## 2013-09-10 MED ORDER — IOHEXOL 350 MG/ML SOLN
100.0000 mL | Freq: Once | INTRAVENOUS | Status: AC | PRN
  Administered 2013-09-10: 100 mL via INTRAVENOUS

## 2013-09-10 NOTE — ED Provider Notes (Addendum)
CSN: [id number]     Arrival date & time 09/10/13  1536 History   First MD Initiated Contact with Patient 09/10/13 1626     Chief Complaint  Patient presents with  . Neck Pain  . Shoulder Pain  . Back Pain   (Consider location/radiation/quality/duration/timing/severity/associated sxs/prior Treatment) HPI Patient reports her whole family had a GI virus starting on December 26 that went through their household. At that time she had fever and chills and vomiting and diarrhea however that has since resolved. She states 2 days ago she started having pain in the left shoulder and in her left posterior back. She states at first she thought she had stiffness from sleeping wrong however the pain has gotten worse. She states the pain is aching and constant. The pain waxes and wanes. She states sometimes squeezing the muscle makes it feel better. Sometimes it doesn't. Sometimes squeezing the muscle makes it feel worse. She denies any worsening of pain with movement of her head or her arm. She has had cough for the past one to 2 days but no recent fever. She denies feeling short of breath. She no longer has vomiting or diarrhea. She denies sore throat or rhinorrhea. She denies any change in her activity or injury. She states she's never had this before. Patient states she has used inhalers in the past however she is not having wheezing.   PCP Dr Delfina Redwood  Past Medical History  Diagnosis Date  . Hypertension    Past Surgical History  Procedure Laterality Date  . Abdominal hysterectomy    . Laparoscopic appendectomy N/A 02/25/2013    Procedure: APPENDECTOMY LAPAROSCOPIC;  Surgeon: Harl Bowie, MD;  Location: [facility] OR;  Service: General;  Laterality: N/A;   Family History  Problem Relation Age of Onset  . Heart disease Father    History  Substance Use Topics  . Smoking status: Current Every Day Smoker -- 1.00 packs/day  . Smokeless tobacco: Not on file  . Alcohol Use: Yes     Comment: occ    Lives at home Takes care of her elderly father and 3 grandchildren  OB History   Grav Para Term Preterm Abortions TAB SAB Ect Mult Living                 Review of Systems  All other systems reviewed and are negative.    Allergies  Other  Home Medications   Current Outpatient Rx  Name  Route  Sig  Dispense  Refill  . albuterol (PROVENTIL HFA;VENTOLIN HFA) 108 (90 BASE) MCG/ACT inhaler   Inhalation   Inhale 2 puffs into the lungs every 6 (six) hours as needed for wheezing or shortness of breath.         Marland Kitchen aspirin 81 MG tablet   Oral   Take 81 mg by mouth daily.         . hydrochlorothiazide (HYDRODIURIL) 25 MG tablet   Oral   Take 25 mg by mouth daily.         . Multiple Vitamins-Minerals (WOMENS MULTI VITAMIN & MINERAL PO)   Oral   Take 1 tablet by mouth daily.          BP 118/73  Pulse 80  Temp(Src) 99.3 F (37.4 C) (Oral)  Resp 17  SpO2 94%  Vital signs normal   Physical Exam  Nursing note and vitals reviewed. Constitutional: She is oriented to person, place, and time. She appears well-developed and well-nourished.  Non-toxic appearance. She  does not appear ill. No distress.  HENT:  Head: Normocephalic and atraumatic.  Right Ear: External ear normal.  Left Ear: External ear normal.  Nose: Nose normal. No mucosal edema or rhinorrhea.  Mouth/Throat: Oropharynx is clear and moist and mucous membranes are normal. No dental abscesses or uvula swelling.  Eyes: Conjunctivae and EOM are normal. Pupils are equal, round, and reactive to light.  Neck: Normal range of motion and full passive range of motion without pain. Neck supple.  Cardiovascular: Normal rate, regular rhythm and normal heart sounds.  Exam reveals no gallop and no friction rub.   No murmur heard. Pulmonary/Chest: Effort normal and breath sounds normal. No respiratory distress. She has no wheezes. She has no rhonchi. She has no rales. She exhibits no tenderness and no crepitus.   Abdominal: Soft. Normal appearance and bowel sounds are normal. She exhibits no distension. There is no tenderness. There is no rebound and no guarding.  Musculoskeletal: Normal range of motion. She exhibits tenderness. She exhibits no edema.       Back:  Moves all extremities well. Patient is tender over the proximal trapezius and extends down over the course of the trapezius to her thoracic spine. Her midline thoracic spine is nontender. She however does not have pain in the same area with movement of her left arm or her head.  Neurological: She is alert and oriented to person, place, and time. She has normal strength. No cranial nerve deficit.  Skin: Skin is warm, dry and intact. No rash noted. No erythema. No pallor.  Psychiatric: She has a normal mood and affect. Her speech is normal and behavior is normal. Her mood appears not anxious.    ED Course  Procedures (including critical care time)  Medications  azithromycin (ZITHROMAX) 500 mg in dextrose 5 % 250 mL IVPB (not administered)  ketorolac (TORADOL) injection 60 mg (60 mg Intramuscular Given 09/10/13 1724)  cyclobenzaprine (FLEXERIL) tablet 10 mg (10 mg Oral Given 09/10/13 1718)  cefTRIAXone (ROCEPHIN) injection 1 g (1 g Intramuscular Given 09/10/13 1718)  lidocaine (PF) (XYLOCAINE) 1 % injection (2.1 mLs  Given 09/10/13 1718)  potassium chloride SA (K-DUR,KLOR-CON) CR tablet 40 mEq (40 mEq Oral Given 09/10/13 1755)  fentaNYL (SUBLIMAZE) injection 50 mcg (50 mcg Intravenous Given 09/10/13 1905)  iohexol (OMNIPAQUE) 350 MG/ML injection 100 mL (100 mLs Intravenous Contrast Given 09/10/13 1936)   Patient was started on antibiotics for her community-acquired pneumonia. She was given Toradol, and Flexeril for her presumed trapezius pain. She reports after 45 minutes her pain was not improved. She was then given fentanyl. Patient was ambulated by nursing staff in her pulse ox drops to 85%. CT anterior chest done to rule out PE/look for other areas of  infiltrate.  20:28 Dr Ernestina Patches, admit to med-surg, team 10    Labs Review Results for orders placed during the hospital encounter of 35/70/17  BASIC METABOLIC PANEL      Result Value Range   Sodium 140  137 - 147 mEq/L   Potassium 3.3 (*) 3.7 - 5.3 mEq/L   Chloride 98  96 - 112 mEq/L   CO2 27  19 - 32 mEq/L   Glucose, Bld 104 (*) 70 - 99 mg/dL   BUN 16  6 - 23 mg/dL   Creatinine, Ser 0.84  0.50 - 1.10 mg/dL   Calcium 8.6  8.4 - 10.5 mg/dL   GFR calc non Af Amer 75 (*) >90 mL/min   GFR calc Af Amer 86 (*) >  90 mL/min  CBC      Result Value Range   WBC 6.6  4.0 - 10.5 K/uL   RBC 5.01  3.87 - 5.11 MIL/uL   Hemoglobin 15.4 (*) 12.0 - 15.0 g/dL   HCT 44.4  36.0 - 46.0 %   MCV 88.6  78.0 - 100.0 fL   MCH 30.7  26.0 - 34.0 pg   MCHC 34.7  30.0 - 36.0 g/dL   RDW 14.1  11.5 - 15.5 %   Platelets 264  150 - 400 K/uL  POCT I-STAT TROPONIN I      Result Value Range   Troponin i, poc 0.01  0.00 - 0.08 ng/mL   Comment 3            Laboratory interpretation all normal except mild hypokalemia    Imaging Review Dg Chest 2 View  09/10/2013   CLINICAL DATA:  Left neck and shoulder pain  EXAM: CHEST  2 VIEW  COMPARISON:  DG CHEST 2 VIEW dated 02/25/2013  FINDINGS: Normal cardiac silhouette. There is chronic linear markings at the left and right lung base suggesting bronchitis. There is sublate airspace opacity in the right lower lobe. Upper lungs are clear.  IMPRESSION: Findings concerning for right lower pneumonia superimposed on chronic bronchitis.   Electronically Signed   By: Suzy Bouchard M.D.   On: 09/10/2013 16:20     Ct Angio Chest W/cm &/or Wo Cm  09/10/2013   CLINICAL DATA:  hypoxia  EXAM: CT ANGIOGRAPHY CHEST WITH CONTRAST  TECHNIQUE: Multidetector CT imaging of the chest was performed using the standard protocol during bolus administration of intravenous contrast. Multiplanar CT image reconstructions including MIPs were obtained to evaluate the vascular anatomy.  CONTRAST:  80 mL  Omnipaque 350 nonionic  COMPARISON:  Chest radiograph September 10, 2013  FINDINGS: There is no demonstrable pulmonary embolus. There is no thoracic aortic aneurysm or dissection.  There is patchy ground-glass appearance in both lower lobes, right middle lobe, periphery of the right upper lobe, and inferior lingula. There is mild interstitial fibrosis in the anterior segment of the right upper lobe peripherally. There is no well-defined airspace consolidation on this study.  There is no appreciable thoracic adenopathy. Pericardium is not thickened.  There is a small hiatal hernia.  Visualized upper abdominal structures appear unremarkable. There is degenerative change in the thoracic spine. There are no blastic or lytic bone lesions.  Review of the MIP images confirms the above findings.  IMPRESSION: No demonstrable pulmonary embolus. Areas of patchy ground-glass appearance, in part due to atelectasis. There may be some mild interstitial pneumonitis of infectious etiology present, particularly in the right upper lobe. There is some mild fibrosis in the right upper lobe anteriorly. There is no well-defined airspace consolidation. No appreciable adenopathy. Small hiatal hernia present.   Electronically Signed   By: Lowella Grip M.D.   On: 09/10/2013 20:04      EKG Interpretation    Date/Time:  Tuesday September 10 2013 15:33:04 EST Ventricular Rate:  87 PR Interval:  170 QRS Duration: 82 QT Interval:  382 QTC Calculation: 459 R Axis:   -104 Text Interpretation:  Normal sinus rhythm Right superior axis deviation Pulmonary disease pattern Septal infarct , age undetermined Since last tracing T wave inversion no longer evident in Inferolateral leads q waves now present in antero/septal leads Confirmed by Pattijo Juste  MD-I, Marino Rogerson ([id number]) on 09/10/2013 4:28:07 PM            MDM  1. CAP (community acquired pneumonia)   2. Strain of left trapezius muscle, initial encounter   3. Hypoxia   4. Hypokalemia      Disposition admission  Rolland Porter, MD, Alanson Aly, MD 09/10/13 Despina Pole  Janice Norrie, MD 09/10/13 2032

## 2013-09-10 NOTE — ED Notes (Addendum)
Contacted flow manager regarding admission order, Dr. Tomi Bamberger at bedside to speak with patient

## 2013-09-10 NOTE — ED Notes (Signed)
PT from MD office with left posterior upper back pain and radiates down left shoulder and upper arm.  Pt has family history of cardiac disease.  PMD did ekg and considered ischemia, gave one nitro and patient broke out into sweat.

## 2013-09-10 NOTE — H&P (Signed)
Hospitalist Admission History and Physical  Patient name: Mariah Schultz Medical record number: [id number] Date of birth: [birth year] Age: 58 y.o. Gender: female  Primary Care Provider: Kandice Hams, MD  Chief Complaint: CAP, L post shoulder/neck pain   History of Present Illness:This is a 58 y.o. year old female with prior hx/o HTN and 45+ pack year smoking history presenting with L post shoulder pain, found to have PNA on CXR. Pt states that her and her entire family had viral gastroenteritis last week that lasted 2-3 days and self resolved. Pt states that she had multiple episodes of NBNB vomiting and watery diarrhea. Pt states that she developed a cough 1-2 days after resolution of gastroenteritis that persisted over the weekend. Pt then developed L post shoulder and neck pain starting yesterday. Pt states that pain has been fairly constant since this point with some radiation of pain down the L arm. No anterior chest wall pain. No assd diaphoresis or nausea with shoulder pain. Did have nausea with gastroenteritis that resolved. Neck and shoulder pain did seem to be worse with movement. Now has been constant. Pain mild in severity.  Pt presented to ER 2/2 pain. Was intitially hypoxic into mid 70s. CXR showed RLL PNA with chronic bronchitis. Follow up CTA showed possible infectious pneumonitis in RUL with no PE present.  WBC @ 6.6. Pt given rocephin and azithromycin in ER.    Patient Active Problem List   Diagnosis Date Noted  . CAP (community acquired pneumonia) 09/10/2013  . Pneumonia 09/10/2013  . Perforated appendicitis 03/19/2013   Past Medical History: Past Medical History  Diagnosis Date  . Hypertension     Past Surgical History: Past Surgical History  Procedure Laterality Date  . Abdominal hysterectomy    . Laparoscopic appendectomy N/A 02/25/2013    Procedure: APPENDECTOMY LAPAROSCOPIC;  Surgeon: Harl Bowie, MD;  Location: [place];  Service: General;  Laterality:  N/A;    Social History: History   Social History  . Marital Status: Married    Spouse Name: N/A    Number of Children: N/A  . Years of Education: N/A   Social History Main Topics  . Smoking status: Current Every Day Smoker -- 1.00 packs/day  . Smokeless tobacco: None  . Alcohol Use: Yes     Comment: occ  . Drug Use: No  . Sexual Activity: None   Other Topics Concern  . None   Social History Narrative  . None    Family History: Family History  Problem Relation Age of Onset  . Heart disease Father     Allergies: Allergies  Allergen Reactions  . Other Swelling    pneumonia vaccine    Current Facility-Administered Medications  Medication Dose Route Frequency Provider Last Rate Last Dose  . albuterol (PROVENTIL) (2.5 MG/3ML) 0.083% nebulizer solution 2.5 mg  2.5 mg Nebulization Q4H Shanda Howells, MD      . albuterol (PROVENTIL) (2.5 MG/3ML) 0.083% nebulizer solution 2.5 mg  2.5 mg Nebulization Q2H PRN Shanda Howells, MD      . Derrill Memo ON 09/11/2013] aspirin chewable tablet 81 mg  81 mg Oral Daily Janice Norrie, MD      . azithromycin (ZITHROMAX) 500 mg in dextrose 5 % 250 mL IVPB  500 mg Intravenous Q24H Shanda Howells, MD      . Derrill Memo ON 09/11/2013] cefTRIAXone (ROCEPHIN) 1 g in dextrose 5 % 50 mL IVPB  1 g Intravenous Q24H Thuy Dien Dang, [facility]      .  heparin injection 5,000 Units  5,000 Units Subcutaneous Q8H Shanda Howells, MD      . methylPREDNISolone sodium succinate (SOLU-MEDROL) 125 mg/2 mL injection 125 mg  125 mg Intramuscular Q8H Shanda Howells, MD      . Derrill Memo ON 09/11/2013] multivitamin with minerals tablet 1 tablet  1 tablet Oral Daily Janice Norrie, MD      . Derrill Memo ON 09/11/2013] spironolactone-hydrochlorothiazide (ALDACTAZIDE) 25-25 MG per tablet 1 tablet  1 tablet Oral Daily [place], [facility]       Review Of Systems: 12 point ROS negative except as noted above in HPI.  Physical Exam: Filed Vitals:   09/10/13 2149  BP: 117/62  Pulse: 71  Temp: 98 F (36.7 C)   Resp: 18    General: alert and cooperative HEENT: PERRLA and extra ocular movement intact, + L sided neck pain worse with resisted neck flexion and extension, mild posterior L shouder/trapezius TTP, mild post shoulder pain worse with resisted external rotation. Mildly empty can positive. Spurlings negative.  Heart: S1, S2 normal, no murmur, rub or gallop, regular rate and rhythm Lungs: unlabored breathing and wheezing Abdomen: abdomen is soft without significant tenderness, masses, organomegaly or guarding Extremities: extremities normal, atraumatic, no cyanosis or edema Skin:no rashes, no ecchymoses Neurology: normal without focal findings  Labs and Imaging: Lab Results  Component Value Date/Time   NA 140 09/10/2013  3:59 PM   K 3.3* 09/10/2013  3:59 PM   CL 98 09/10/2013  3:59 PM   CO2 27 09/10/2013  3:59 PM   BUN 16 09/10/2013  3:59 PM   CREATININE 0.84 09/10/2013  3:59 PM   GLUCOSE 104* 09/10/2013  3:59 PM   Lab Results  Component Value Date   WBC 6.6 09/10/2013   HGB 15.4* 09/10/2013   HCT 44.4 09/10/2013   MCV 88.6 09/10/2013   PLT 264 09/10/2013    Dg Chest 2 View  09/10/2013   CLINICAL DATA:  Left neck and shoulder pain  EXAM: CHEST  2 VIEW  COMPARISON:  DG CHEST 2 VIEW dated 02/25/2013  FINDINGS: Normal cardiac silhouette. There is chronic linear markings at the left and right lung base suggesting bronchitis. There is sublate airspace opacity in the right lower lobe. Upper lungs are clear.  IMPRESSION: Findings concerning for right lower pneumonia superimposed on chronic bronchitis.   Electronically Signed   By: Suzy Bouchard M.D.   On: 09/10/2013 16:20   Ct Angio Chest W/cm &/or Wo Cm  09/10/2013   CLINICAL DATA:  hypoxia  EXAM: CT ANGIOGRAPHY CHEST WITH CONTRAST  TECHNIQUE: Multidetector CT imaging of the chest was performed using the standard protocol during bolus administration of intravenous contrast. Multiplanar CT image reconstructions including MIPs were obtained to evaluate the  vascular anatomy.  CONTRAST:  80 mL Omnipaque 350 nonionic  COMPARISON:  Chest radiograph September 10, 2013  FINDINGS: There is no demonstrable pulmonary embolus. There is no thoracic aortic aneurysm or dissection.  There is patchy ground-glass appearance in both lower lobes, right middle lobe, periphery of the right upper lobe, and inferior lingula. There is mild interstitial fibrosis in the anterior segment of the right upper lobe peripherally. There is no well-defined airspace consolidation on this study.  There is no appreciable thoracic adenopathy. Pericardium is not thickened.  There is a small hiatal hernia.  Visualized upper abdominal structures appear unremarkable. There is degenerative change in the thoracic spine. There are no blastic or lytic bone lesions.  Review of the MIP images  confirms the above findings.  IMPRESSION: No demonstrable pulmonary embolus. Areas of patchy ground-glass appearance, in part due to atelectasis. There may be some mild interstitial pneumonitis of infectious etiology present, particularly in the right upper lobe. There is some mild fibrosis in the right upper lobe anteriorly. There is no well-defined airspace consolidation. No appreciable adenopathy. Small hiatal hernia present.   Electronically Signed   By: Lowella Grip M.D.   On: 09/10/2013 20:04     Assessment and Plan: SKIE VITRANO is a 58 y.o. year old female presenting with CAP and post neck/shouder pain.   Wheezing/CAP: Will start on CAP treatment with rocephin and azithromycin. Urine strep and legionella. Sputum and blood cultures. ? Aspiration event given recent gastroenteritis with recurrent vomiting. Continue supplemental O2. Add on solumedrol and scheduled nebs as there is likely a component of sub-acute obstructive lung disease. Smoking cessation consult.   L neck/shoulder pain: Suspect pleuritic/MSK etiology. Reproducible pain on exam. No radicular findings on exam. Check c spine and L  shoulder xray to coorelate. Fairly atypical presentation for cardiac source. Will check EKG and cycle CEs. No prior cardiac history. Prn vicodin for pain. Solumedrol should give good anti-inflammatory effect.   HTN: Well controlled. Continue home meds.  FEN/GI: heart healthy diet.  Prophylaxis: sub q heparin Disposition: pending further evaluation  Code Status:full code        Shanda Howells MD  Pager: [phone]

## 2013-09-10 NOTE — ED Notes (Signed)
MD at bedside. 

## 2013-09-10 NOTE — ED Notes (Signed)
EDP at bedside  

## 2013-09-11 ENCOUNTER — Inpatient Hospital Stay (HOSPITAL_COMMUNITY): Payer: BC Managed Care – PPO

## 2013-09-11 DIAGNOSIS — I10 Essential (primary) hypertension: Secondary | ICD-10-CM | POA: Diagnosis present

## 2013-09-11 DIAGNOSIS — M25512 Pain in left shoulder: Secondary | ICD-10-CM | POA: Diagnosis present

## 2013-09-11 DIAGNOSIS — S46819A Strain of other muscles, fascia and tendons at shoulder and upper arm level, unspecified arm, initial encounter: Secondary | ICD-10-CM

## 2013-09-11 DIAGNOSIS — S43499A Other sprain of unspecified shoulder joint, initial encounter: Secondary | ICD-10-CM

## 2013-09-11 DIAGNOSIS — J189 Pneumonia, unspecified organism: Secondary | ICD-10-CM

## 2013-09-11 LAB — COMPREHENSIVE METABOLIC PANEL
ALT: 14 U/L (ref 0–35)
AST: 16 U/L (ref 0–37)
Albumin: 3 g/dL — ABNORMAL LOW (ref 3.5–5.2)
Alkaline Phosphatase: 112 U/L (ref 39–117)
BUN: 18 mg/dL (ref 6–23)
CO2: 23 meq/L (ref 19–32)
CREATININE: 0.78 mg/dL (ref 0.50–1.10)
Calcium: 9.1 mg/dL (ref 8.4–10.5)
Chloride: 99 mEq/L (ref 96–112)
GFR, EST NON AFRICAN AMERICAN: 90 mL/min — AB (ref >90)
GLUCOSE: 181 mg/dL — AB (ref 70–99)
Potassium: 3.8 mEq/L (ref 3.7–5.3)
SODIUM: 140 meq/L (ref 137–147)
TOTAL PROTEIN: 7.4 g/dL (ref 6.0–8.3)
Total Bilirubin: <0.2 mg/dL — ABNORMAL LOW (ref 0.3–1.2)

## 2013-09-11 LAB — HIV ANTIBODY (ROUTINE TESTING W REFLEX): HIV: NONREACTIVE

## 2013-09-11 LAB — CBC WITH DIFFERENTIAL/PLATELET
BASOS PCT: 0 % (ref 0–1)
Basophils Absolute: 0 10*3/uL (ref 0.0–0.1)
Eosinophils Absolute: 0 10*3/uL (ref 0.0–0.7)
Eosinophils Relative: 0 % (ref 0–5)
HEMATOCRIT: 44.7 % (ref 36.0–46.0)
HEMOGLOBIN: 15.1 g/dL — AB (ref 12.0–15.0)
LYMPHS ABS: 0.5 10*3/uL — AB (ref 0.7–4.0)
LYMPHS PCT: 12 % (ref 12–46)
MCH: 30.3 pg (ref 26.0–34.0)
MCHC: 33.8 g/dL (ref 30.0–36.0)
MCV: 89.6 fL (ref 78.0–100.0)
MONO ABS: 0.3 10*3/uL (ref 0.1–1.0)
MONOS PCT: 7 % (ref 3–12)
NEUTROS ABS: 3.1 10*3/uL (ref 1.7–7.7)
NEUTROS PCT: 80 % — AB (ref 43–77)
Platelets: 256 10*3/uL (ref 150–400)
RBC: 4.99 MIL/uL (ref 3.87–5.11)
RDW: 14.2 % (ref 11.5–15.5)
WBC: 3.8 10*3/uL — AB (ref 4.0–10.5)

## 2013-09-11 LAB — TROPONIN I: Troponin I: <0.3 ng/mL (ref <0.30)

## 2013-09-11 MED ORDER — ALBUTEROL SULFATE (2.5 MG/3ML) 0.083% IN NEBU: 5.0000 mg | INHALATION_SOLUTION | RESPIRATORY_TRACT | Status: DC | PRN

## 2013-09-11 MED ORDER — HYDROCODONE-ACETAMINOPHEN 5-325 MG PO TABS: 1.0000 | ORAL_TABLET | Freq: Four times a day (QID) | ORAL | Status: DC | PRN

## 2013-09-11 MED ORDER — METHOCARBAMOL 500 MG PO TABS: 500.0000 mg | ORAL_TABLET | Freq: Four times a day (QID) | ORAL | Status: DC

## 2013-09-11 MED ORDER — LEVOFLOXACIN 750 MG PO TABS: 750.0000 mg | ORAL_TABLET | Freq: Every day | ORAL | Status: DC

## 2013-09-11 MED ORDER — ALBUTEROL SULFATE (2.5 MG/3ML) 0.083% IN NEBU
2.5000 mg | INHALATION_SOLUTION | Freq: Two times a day (BID) | RESPIRATORY_TRACT | Status: DC
  Administered 2013-09-11: 2.5 mg via RESPIRATORY_TRACT
  Filled 2013-09-11: qty 3

## 2013-09-11 MED ORDER — METHYLPREDNISOLONE SODIUM SUCC 125 MG IJ SOLR
125.0000 mg | Freq: Three times a day (TID) | INTRAMUSCULAR | Status: DC
Start: 2013-09-11 — End: 2013-09-11
  Administered 2013-09-11: 125 mg via INTRAVENOUS
  Filled 2013-09-11 (×3): qty 2

## 2013-09-11 NOTE — Progress Notes (Signed)
UR completed. Aarushi Hemric RN CCM Case Mgmt 

## 2013-09-11 NOTE — Discharge Summary (Signed)
Physician Discharge Summary  Mariah Schultz [id number] DOB: 09/30/1955 DOA: 09/10/2013  PCP: Kandice Hams, MD  Admit date: 09/10/2013 Discharge date: 09/11/2013  Time spent: 40 minutes  Recommendations for Outpatient Follow-up:  1. Followup with primary care physician within one week.  Discharge Diagnoses:  Principal Problem:   CAP (community acquired pneumonia) Active Problems:   Left shoulder pain   Hypertension   Discharge Condition: 40 minutes  Diet recommendation: Heart healthy diet  Filed Weights   09/10/13 2149  Weight: 83.5 kg (184 lb 1.4 oz)    History of present illness:  This is a 58 y.o. year old female with prior hx/o HTN and 45+ pack year smoking history presenting with L post shoulder pain, found to have PNA on CXR. Pt states that her and her entire family had viral gastroenteritis last week that lasted 2-3 days and self resolved. Pt states that she had multiple episodes of NBNB vomiting and watery diarrhea. Pt states that she developed a cough 1-2 days after resolution of gastroenteritis that persisted over the weekend. Pt then developed L post shoulder and neck pain starting yesterday. Pt states that pain has been fairly constant since this point with some radiation of pain down the L arm. No anterior chest wall pain. No assd diaphoresis or nausea with shoulder pain. Did have nausea with gastroenteritis that resolved. Neck and shoulder pain did seem to be worse with movement. Now has been constant. Pain mild in severity.  Pt presented to ER 2/2 pain. Was intitially hypoxic into mid 73s. CXR showed RLL PNA with chronic bronchitis. Follow up CTA showed possible infectious pneumonitis in RUL with no PE present. WBC @ 6.6. Pt given rocephin and azithromycin in ER.  Hospital Course:   1. Community-acquired pneumonia: Patient admitted to the hospital for cough, shortness of breath and hypoxia. CT scan of the chest was done because of hypoxia showed no evidence of  PE, the CT angio of the chest showed right-sided groundglass appearance consistent with a pneumonia. Patient started on azithromycin and Rocephin in the hospital, at the time of discharge patient discharged on Levaquin for 5 more days. Patient also to take Mucinex.  2. Left shoulder pain: Patient sees to have reproducible muscular pain, involving the left trapezius and rhomboids. Patient reported pain increases with prolonged sitting or reading books. Patient educated about how to keep her neck alignment, and decrease the stress off of her shoulder. If patient continues to have pain she might need physical therapy as outpatient. Prescription for Vicodin was given and patient instructed to do over-the-counter muscle pain relief agents like BenGay.  3. Hypoxia: This is secondary to commit acquired pneumonia, patient was 85% on room air yesterday, she is satting in the mid 90s today.  Patient asked to be discharged today, I did see her late the morning and tried to call Dr. Delfina Redwood in the office. You protocol the is not working in the afternoon and ask if he can be paged but the operator says they can only paged the on-call doctor.   Procedures:  None  Consultations:  None  Discharge Exam: Filed Vitals:   09/11/13 1409  BP: 149/85  Pulse: 73  Temp: 97.7 F (36.5 C)  Resp: 18   General: Alert and awake, oriented x3, not in any acute distress. HEENT: anicteric sclera, pupils reactive to light and accommodation, EOMI CVS: S1-S2 clear, no murmur rubs or gallops Chest: clear to auscultation bilaterally, no wheezing, rales or rhonchi Abdomen: soft nontender, nondistended,  normal bowel sounds, no organomegaly Extremities: no cyanosis, clubbing or edema noted bilaterally Neuro: Cranial nerves II-XII intact, no focal neurological deficits  Discharge Instructions  Discharge Orders   Future Orders Complete By Expires   Diet - low sodium heart healthy  As directed    Increase activity slowly   As directed        Medication List         albuterol 108 (90 BASE) MCG/ACT inhaler  Commonly known as:  PROVENTIL HFA;VENTOLIN HFA  Inhale 2 puffs into the lungs every 6 (six) hours as needed for wheezing or shortness of breath.     aspirin 81 MG tablet  Take 81 mg by mouth daily.     hydrochlorothiazide 25 MG tablet  Commonly known as:  HYDRODIURIL  Take 25 mg by mouth daily.     HYDROcodone-acetaminophen 5-325 MG per tablet  Commonly known as:  NORCO/VICODIN  Take 1 tablet by mouth every 6 (six) hours as needed for moderate pain.     levofloxacin 750 MG tablet  Commonly known as:  LEVAQUIN  Take 1 tablet (750 mg total) by mouth daily.     methocarbamol 500 MG tablet  Commonly known as:  ROBAXIN  Take 1 tablet (500 mg total) by mouth 4 (four) times daily.     WOMENS MULTI VITAMIN & MINERAL PO  Take 1 tablet by mouth daily.       Allergies  Allergen Reactions  . Other Swelling    pneumonia vaccine       Follow-up Information   Follow up with POLITE,RONALD D, MD In 1 week.   Specialty:  Internal Medicine   Contact information:   [address] [place]  [address] [phone]        The results of significant diagnostics from this hospitalization (including imaging, microbiology, ancillary and laboratory) are listed below for reference.    Significant Diagnostic Studies: Dg Chest 2 View  09/10/2013   CLINICAL DATA:  Left neck and shoulder pain  EXAM: CHEST  2 VIEW  COMPARISON:  DG CHEST 2 VIEW dated 02/25/2013  FINDINGS: Normal cardiac silhouette. There is chronic linear markings at the left and right lung base suggesting bronchitis. There is sublate airspace opacity in the right lower lobe. Upper lungs are clear.  IMPRESSION: Findings concerning for right lower pneumonia superimposed on chronic bronchitis.   Electronically Signed   By: Suzy Bouchard M.D.   On: 09/10/2013 16:20   Dg Lumbar Spine Complete  09/11/2013   CLINICAL DATA:  Low  back pain.  EXAM: LUMBAR SPINE - COMPLETE 4+ VIEW  COMPARISON:  CT 02/25/2013  FINDINGS: AP, lateral and oblique images of the lumbar spine were obtained. Degenerative facet arthropathy at L5-S1 and L4-L5. There is contrast in the urinary bladder from recent CT examination. Alignment of the lumbar spine is normal. There are mild degenerative endplate changes. The vertebral body heights are maintained. No evidence for an acute fracture.  IMPRESSION: Mild degenerative changes.  No acute bone abnormality.   Electronically Signed   By: Markus Daft M.D.   On: 09/11/2013 08:34   Ct Angio Chest W/cm &/or Wo Cm  09/10/2013   CLINICAL DATA:  hypoxia  EXAM: CT ANGIOGRAPHY CHEST WITH CONTRAST  TECHNIQUE: Multidetector CT imaging of the chest was performed using the standard protocol during bolus administration of intravenous contrast. Multiplanar CT image reconstructions including MIPs were obtained to evaluate the vascular anatomy.  CONTRAST:  80 mL Omnipaque 350  nonionic  COMPARISON:  Chest radiograph September 10, 2013  FINDINGS: There is no demonstrable pulmonary embolus. There is no thoracic aortic aneurysm or dissection.  There is patchy ground-glass appearance in both lower lobes, right middle lobe, periphery of the right upper lobe, and inferior lingula. There is mild interstitial fibrosis in the anterior segment of the right upper lobe peripherally. There is no well-defined airspace consolidation on this study.  There is no appreciable thoracic adenopathy. Pericardium is not thickened.  There is a small hiatal hernia.  Visualized upper abdominal structures appear unremarkable. There is degenerative change in the thoracic spine. There are no blastic or lytic bone lesions.  Review of the MIP images confirms the above findings.  IMPRESSION: No demonstrable pulmonary embolus. Areas of patchy ground-glass appearance, in part due to atelectasis. There may be some mild interstitial pneumonitis of infectious etiology present,  particularly in the right upper lobe. There is some mild fibrosis in the right upper lobe anteriorly. There is no well-defined airspace consolidation. No appreciable adenopathy. Small hiatal hernia present.   Electronically Signed   By: Lowella Grip M.D.   On: 09/10/2013 20:04   Dg Shoulder Left  09/11/2013   CLINICAL DATA:  Shoulder pain.  EXAM: LEFT SHOULDER - 2+ VIEW  COMPARISON:  Chest radiograph 09/10/2013  FINDINGS: Left shoulder is located without acute fracture. The visualized left ribs are intact. Left AC joint is intact. There are mild degenerative changes along the inferior left glenoid. Mild downsloping of the acromion can be associated with impingement.  IMPRESSION: No acute bone abnormality in the left shoulder.   Electronically Signed   By: Markus Daft M.D.   On: 09/11/2013 08:37    Microbiology: No results found for this or any previous visit (from the past 240 hour(s)).   Labs: Basic Metabolic Panel:  Recent Labs Lab 09/10/13 1559 09/11/13 0434  NA 140 140  K 3.3* 3.8  CL 98 99  CO2 27 23  GLUCOSE 104* 181*  BUN 16 18  CREATININE 0.84 0.78  CALCIUM 8.6 9.1   Liver Function Tests:  Recent Labs Lab 09/11/13 0434  AST 16  ALT 14  ALKPHOS 112  BILITOT <0.2*  PROT 7.4  ALBUMIN 3.0*   No results found for this basename: LIPASE, AMYLASE,  in the last 168 hours No results found for this basename: AMMONIA,  in the last 168 hours CBC:  Recent Labs Lab 09/10/13 1559 09/11/13 0434  WBC 6.6 3.8*  NEUTROABS  --  3.1  HGB 15.4* 15.1*  HCT 44.4 44.7  MCV 88.6 89.6  PLT 264 256   Cardiac Enzymes:  Recent Labs Lab 09/10/13 2212 09/11/13 0702  TROPONINI <0.30 <0.30   BNP: BNP (last 3 results) No results found for this basename: PROBNP,  in the last 8760 hours CBG: No results found for this basename: GLUCAP,  in the last 168 hours     Signed:  Asyria Kolander A  [facility] 09/11/2013, 3:06 PM

## 2013-09-11 NOTE — Progress Notes (Signed)
Spoke with Dr. Delfina Redwood, informed him Dr. Hartford Poli was going to get intouch with him about patient. Dr. Delfina Redwood can be reached at [phone]

## 2013-09-11 NOTE — Progress Notes (Signed)
Nsg Discharge Note  Admit Date:  09/10/2013 Discharge date: 09/11/2013   Mariah Schultz to be D/C'd Home per MD order.  AVS completed.  Copy for chart, and copy for patient signed, and dated. Patient/caregiver able to verbalize understanding.  Discharge Medication:   Medication List         albuterol 108 (90 BASE) MCG/ACT inhaler  Commonly known as:  PROVENTIL HFA;VENTOLIN HFA  Inhale 2 puffs into the lungs every 6 (six) hours as needed for wheezing or shortness of breath.     aspirin 81 MG tablet  Take 81 mg by mouth daily.     hydrochlorothiazide 25 MG tablet  Commonly known as:  HYDRODIURIL  Take 25 mg by mouth daily.     HYDROcodone-acetaminophen 5-325 MG per tablet  Commonly known as:  NORCO/VICODIN  Take 1 tablet by mouth every 6 (six) hours as needed for moderate pain.     levofloxacin 750 MG tablet  Commonly known as:  LEVAQUIN  Take 1 tablet (750 mg total) by mouth daily.     methocarbamol 500 MG tablet  Commonly known as:  ROBAXIN  Take 1 tablet (500 mg total) by mouth 4 (four) times daily.     WOMENS MULTI VITAMIN & MINERAL PO  Take 1 tablet by mouth daily.        Discharge Assessment: Filed Vitals:   09/11/13 1409  BP: 149/85  Pulse: 73  Temp: 97.7 F (36.5 C)  Resp: 18   Skin clean, dry and intact without evidence of skin break down, no evidence of skin tears noted. IV catheter discontinued intact. Site without signs and symptoms of complications - no redness or edema noted at insertion site, patient denies c/o pain - only slight tenderness at site.  Dressing with slight pressure applied.  D/c Instructions-Education: Discharge instructions given to patient/family with verbalized understanding. D/c education completed with patient/family including follow up instructions, medication list, d/c activities limitations if indicated, with other d/c instructions as indicated by MD - patient able to verbalize understanding, all questions fully  answered. Patient instructed to return to ED, call 911, or call MD for any changes in condition.  Patient escorted via [place], and D/C home via private auto.  Dayle Points, RN 09/11/2013 2:57 PM

## 2013-09-12 NOTE — Care Management Note (Signed)
    Page 1 of 1   09/12/2013     9:55:42 AM   CARE MANAGEMENT NOTE 09/12/2013  Patient:  Mariah Schultz, Mariah Schultz   Account Number:  [id number]  Date Initiated:  09/12/2013  Documentation initiated by:  Tomi Bamberger  Subjective/Objective Assessment:   dx cap  admit- lives with spouse.     Action/Plan:   Anticipated DC Date:  09/11/2013   Anticipated DC Plan:  [place]  CM consult      Choice offered to / List presented to:             Status of service:  In process, will continue to follow Medicare Important Message given?   (If response is "NO", the following Medicare IM given date fields will be blank) Date Medicare IM given:   Date Additional Medicare IM given:    Discharge Disposition:  HOME/SELF CARE  Per UR Regulation:  Reviewed for med. necessity/level of care/duration of stay  If discussed at [place] of Stay Meetings, dates discussed:    Comments:  09/12/13 9:54 Tomi Bamberger RN, BSN [phone] patient lives with spouse, dc'd on 1/7, no NCM referral, no needs anticipated.

## 2013-09-17 LAB — CULTURE, BLOOD (ROUTINE X 2)
Culture: NO GROWTH
Culture: NO GROWTH

## 2014-07-03 ENCOUNTER — Other Ambulatory Visit: Payer: Self-pay | Admitting: Internal Medicine

## 2014-07-03 DIAGNOSIS — Z1231 Encounter for screening mammogram for malignant neoplasm of breast: Secondary | ICD-10-CM

## 2014-07-22 ENCOUNTER — Ambulatory Visit
Admission: RE | Admit: 2014-07-22 | Discharge: 2014-07-22 | Disposition: A | Discharge status: Home / Self Care | Payer: BC Managed Care – PPO | Source: Ambulatory Visit | Attending: Internal Medicine | Admitting: Internal Medicine

## 2014-07-22 DIAGNOSIS — Z1231 Encounter for screening mammogram for malignant neoplasm of breast: Secondary | ICD-10-CM

## 2016-09-13 ENCOUNTER — Other Ambulatory Visit (HOSPITAL_COMMUNITY)
Admission: RE | Admit: 2016-09-13 | Discharge: 2016-09-13 | Disposition: A | Discharge status: Home / Self Care | Payer: Managed Care, Other (non HMO) | Source: Ambulatory Visit | Attending: Nurse Practitioner | Admitting: Nurse Practitioner

## 2016-09-13 ENCOUNTER — Other Ambulatory Visit: Payer: Self-pay | Admitting: Nurse Practitioner

## 2016-09-13 DIAGNOSIS — Z01419 Encounter for gynecological examination (general) (routine) without abnormal findings: Secondary | ICD-10-CM | POA: Diagnosis present

## 2016-09-13 DIAGNOSIS — Z1151 Encounter for screening for human papillomavirus (HPV): Secondary | ICD-10-CM | POA: Diagnosis not present

## 2016-09-15 LAB — CYTOLOGY - PAP
DIAGNOSIS: NEGATIVE
HPV (WINDOPATH): NOT DETECTED

## 2018-09-17 ENCOUNTER — Other Ambulatory Visit: Payer: Self-pay | Admitting: Internal Medicine

## 2018-09-17 DIAGNOSIS — Z1231 Encounter for screening mammogram for malignant neoplasm of breast: Secondary | ICD-10-CM

## 2018-10-15 ENCOUNTER — Ambulatory Visit
Admission: RE | Admit: 2018-10-15 | Discharge: 2018-10-15 | Disposition: A | Discharge status: Home / Self Care | Payer: Managed Care, Other (non HMO) | Source: Ambulatory Visit | Attending: Internal Medicine | Admitting: Internal Medicine

## 2018-10-15 DIAGNOSIS — Z1231 Encounter for screening mammogram for malignant neoplasm of breast: Secondary | ICD-10-CM

## 2018-10-17 ENCOUNTER — Other Ambulatory Visit: Payer: Self-pay | Admitting: Internal Medicine

## 2018-10-17 DIAGNOSIS — R928 Other abnormal and inconclusive findings on diagnostic imaging of breast: Secondary | ICD-10-CM

## 2018-10-22 ENCOUNTER — Other Ambulatory Visit: Payer: Self-pay | Admitting: Internal Medicine

## 2018-10-22 ENCOUNTER — Ambulatory Visit
Admission: RE | Admit: 2018-10-22 | Discharge: 2018-10-22 | Disposition: A | Discharge status: Home / Self Care | Payer: Managed Care, Other (non HMO) | Source: Ambulatory Visit | Attending: Internal Medicine | Admitting: Internal Medicine

## 2018-10-22 DIAGNOSIS — R928 Other abnormal and inconclusive findings on diagnostic imaging of breast: Secondary | ICD-10-CM

## 2018-10-22 DIAGNOSIS — N6320 Unspecified lump in the left breast, unspecified quadrant: Secondary | ICD-10-CM

## 2018-10-25 ENCOUNTER — Ambulatory Visit
Admission: RE | Admit: 2018-10-25 | Discharge: 2018-10-25 | Disposition: A | Discharge status: Home / Self Care | Payer: Managed Care, Other (non HMO) | Source: Ambulatory Visit | Attending: Internal Medicine | Admitting: Internal Medicine

## 2018-10-25 DIAGNOSIS — N6320 Unspecified lump in the left breast, unspecified quadrant: Secondary | ICD-10-CM

## 2018-11-01 ENCOUNTER — Other Ambulatory Visit: Payer: Self-pay | Admitting: General Surgery

## 2018-11-01 ENCOUNTER — Encounter: Payer: Self-pay | Admitting: General Surgery

## 2018-11-01 DIAGNOSIS — D242 Benign neoplasm of left breast: Secondary | ICD-10-CM

## 2018-11-16 ENCOUNTER — Encounter (HOSPITAL_BASED_OUTPATIENT_CLINIC_OR_DEPARTMENT_OTHER): Payer: Self-pay | Admitting: *Deleted

## 2018-11-16 ENCOUNTER — Other Ambulatory Visit: Payer: Self-pay

## 2018-11-19 ENCOUNTER — Other Ambulatory Visit: Payer: Self-pay

## 2018-11-19 ENCOUNTER — Encounter (HOSPITAL_BASED_OUTPATIENT_CLINIC_OR_DEPARTMENT_OTHER)
Admission: RE | Admit: 2018-11-19 | Discharge: 2018-11-19 | Disposition: A | Discharge status: Home / Self Care | Payer: Managed Care, Other (non HMO) | Source: Ambulatory Visit | Attending: General Surgery | Admitting: General Surgery

## 2018-11-19 DIAGNOSIS — Z01818 Encounter for other preprocedural examination: Secondary | ICD-10-CM | POA: Diagnosis not present

## 2018-11-19 LAB — COMPREHENSIVE METABOLIC PANEL
ALBUMIN: 3.7 g/dL (ref 3.5–5.0)
ALT: 14 U/L (ref 0–44)
AST: 15 U/L (ref 15–41)
Alkaline Phosphatase: 119 U/L (ref 38–126)
Anion gap: 10 (ref 5–15)
BUN: 11 mg/dL (ref 8–23)
CO2: 25 mmol/L (ref 22–32)
Calcium: 9.6 mg/dL (ref 8.9–10.3)
Chloride: 105 mmol/L (ref 98–111)
Creatinine, Ser: 0.86 mg/dL (ref 0.44–1.00)
GFR calc Af Amer: >60 mL/min (ref >60)
GFR calc non Af Amer: >60 mL/min (ref >60)
Glucose, Bld: 100 mg/dL — ABNORMAL HIGH (ref 70–99)
Potassium: 4.3 mmol/L (ref 3.5–5.1)
Sodium: 140 mmol/L (ref 135–145)
Total Bilirubin: 0.5 mg/dL (ref 0.3–1.2)
Total Protein: 7.9 g/dL (ref 6.5–8.1)

## 2018-11-19 LAB — CBC WITH DIFFERENTIAL/PLATELET
Abs Immature Granulocytes: 0.02 10*3/uL (ref 0.00–0.07)
Basophils Absolute: 0 10*3/uL (ref 0.0–0.1)
Basophils Relative: 0 %
EOS ABS: 0.2 10*3/uL (ref 0.0–0.5)
Eosinophils Relative: 3 %
HEMATOCRIT: 48.2 % — AB (ref 36.0–46.0)
Hemoglobin: 16.3 g/dL — ABNORMAL HIGH (ref 12.0–15.0)
Immature Granulocytes: 0 %
LYMPHS ABS: 2.1 10*3/uL (ref 0.7–4.0)
Lymphocytes Relative: 30 %
MCH: 31 pg (ref 26.0–34.0)
MCHC: 33.8 g/dL (ref 30.0–36.0)
MCV: 91.6 fL (ref 80.0–100.0)
Monocytes Absolute: 0.5 10*3/uL (ref 0.1–1.0)
Monocytes Relative: 7 %
Neutro Abs: 4.1 10*3/uL (ref 1.7–7.7)
Neutrophils Relative %: 60 %
Platelets: 263 10*3/uL (ref 150–400)
RBC: 5.26 MIL/uL — ABNORMAL HIGH (ref 3.87–5.11)
RDW: 13.9 % (ref 11.5–15.5)
WBC: 6.9 10*3/uL (ref 4.0–10.5)
nRBC: 0 % (ref 0.0–0.2)

## 2018-11-19 NOTE — Progress Notes (Signed)
Pt instructed to drink Ensure day of surgery by 0415 by Donnald Garre RN with teach back method. Donnald Garre Rn reviewed EKG with Dr. Roanna Banning [facility] for surgery. Sunday Spillers gave CHG soap with instructions to pt.

## 2018-11-23 ENCOUNTER — Inpatient Hospital Stay: Admission: RE | Admit: 2018-11-23 | Payer: Managed Care, Other (non HMO) | Source: Ambulatory Visit

## 2019-01-10 ENCOUNTER — Other Ambulatory Visit: Payer: Self-pay | Admitting: General Surgery

## 2019-01-23 ENCOUNTER — Ambulatory Visit (HOSPITAL_BASED_OUTPATIENT_CLINIC_OR_DEPARTMENT_OTHER)
Admission: RE | Admit: 2019-01-23 | Payer: Managed Care, Other (non HMO) | Source: Home / Self Care | Admitting: General Surgery

## 2019-01-23 HISTORY — DX: Benign neoplasm of left breast: D24.2

## 2019-01-23 SURGERY — BREAST LUMPECTOMY WITH RADIOACTIVE SEED LOCALIZATION
Anesthesia: General | Site: Breast | Laterality: Left

## 2019-02-19 ENCOUNTER — Other Ambulatory Visit: Payer: Self-pay

## 2019-02-19 ENCOUNTER — Encounter (HOSPITAL_BASED_OUTPATIENT_CLINIC_OR_DEPARTMENT_OTHER): Payer: Self-pay | Admitting: *Deleted

## 2019-02-22 ENCOUNTER — Other Ambulatory Visit (HOSPITAL_COMMUNITY)
Admission: RE | Admit: 2019-02-22 | Discharge: 2019-02-22 | Disposition: A | Discharge status: Home / Self Care | Payer: Managed Care, Other (non HMO) | Source: Ambulatory Visit | Attending: General Surgery | Admitting: General Surgery

## 2019-02-22 ENCOUNTER — Other Ambulatory Visit: Payer: Self-pay | Admitting: General Surgery

## 2019-02-22 ENCOUNTER — Other Ambulatory Visit: Payer: Self-pay

## 2019-02-22 ENCOUNTER — Encounter (HOSPITAL_BASED_OUTPATIENT_CLINIC_OR_DEPARTMENT_OTHER)
Admission: RE | Admit: 2019-02-22 | Discharge: 2019-02-22 | Disposition: A | Discharge status: Home / Self Care | Payer: Managed Care, Other (non HMO) | Source: Ambulatory Visit | Attending: General Surgery | Admitting: General Surgery

## 2019-02-22 DIAGNOSIS — D242 Benign neoplasm of left breast: Secondary | ICD-10-CM

## 2019-02-22 DIAGNOSIS — Z1159 Encounter for screening for other viral diseases: Secondary | ICD-10-CM | POA: Diagnosis not present

## 2019-02-22 LAB — BASIC METABOLIC PANEL
Anion gap: 10 (ref 5–15)
BUN: 15 mg/dL (ref 8–23)
CO2: 25 mmol/L (ref 22–32)
Calcium: 9.3 mg/dL (ref 8.9–10.3)
Chloride: 106 mmol/L (ref 98–111)
Creatinine, Ser: 0.72 mg/dL (ref 0.44–1.00)
GFR calc Af Amer: >60 mL/min (ref >60)
GFR calc non Af Amer: >60 mL/min (ref >60)
Glucose, Bld: 120 mg/dL — ABNORMAL HIGH (ref 70–99)
Potassium: 3.9 mmol/L (ref 3.5–5.1)
Sodium: 141 mmol/L (ref 135–145)

## 2019-02-22 LAB — SARS CORONAVIRUS 2 (TAT 6-24 HRS): SARS Coronavirus 2: NEGATIVE

## 2019-02-22 NOTE — Progress Notes (Signed)
New time for ensure drink- 0500 dos, pt verbalized understanding.

## 2019-02-23 NOTE — H&P (Signed)
Mariah Schultz Location: [facility] Patient #: [id number] DOB: February 23, 1956 Married / Language: English / Race: Black or African American Female       History of Present Illness       This is a 63 year old female, referred by Dr. 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 Imaging studies were done at [facility] by Dr. Peggye Fothergill. Her husband is with her throughout the encounter     Only history is a core biopsy of a right breast which was benign fatty tissue. She gets mammograms intermittently. Recent mammogram show a 5 mm mass in the left breast, subareolar area, 6 o'clock position. Left axillary ultrasound negative. Core biopsy shows papillary lesion. No nipple discharge      Past history reveals the core biopsy right breast for benign disease. Lap appendectomy. TAH without BSO through Pfannenstiel incision. Hypertension Family history revealed 2 paternal aunts with breast cancer. Otherwise no cancer syndromes. Mother and father are deceased but died of heart disease and no cancer Social history reveals she is married with 3 children. Retired. Smokes 1 pack of cigarettes per day. Denies alcohol.      I advised her that she probably does not have cancer, but this is classified as a high-grade lesion with a risk of cancer upgrade on excision to about 5%. We had a lengthy discussion. Because of the family history she does not want to take the chance and wants to schedule left breast lumpectomy. She'll be scheduled for left breast lumpectomy with radioactive seed localization. I discussed the indications, details, techniques, and numerous risk of the surgery with her and her husband. She is aware of the risk of bleeding, infection, cosmetic deformity, chronic pain, insensate nipple, flattened nipple, reoperation if cancer. She understands all these issues. All questions are answered. She agrees with this plan     Schedule at her convenience Advised to  stop smoking Discontinue aspirin and fish oil 5 days preop     Past Surgical History Appendectomy  Hysterectomy (not due to cancer) - Partial   Diagnostic Studies History  Colonoscopy  5-10 years ago Mammogram  within last year Pap Smear  1-5 years ago  Allergies  Pneumococcal Vaccines  Allergies Reconciled   Medication History Aspirin (81MG  Tablet, Oral) Active. hydroCHLOROthiazide (25MG  Tablet, Oral) Active. Medications Reconciled  Social History  Alcohol use  Moderate alcohol use. Caffeine use  Coffee. No drug use  Tobacco use  Current every day smoker.  Family History  Alcohol Abuse  Mother.  Pregnancy / Birth History  Age at menarche  8 years. Gravida  3 Irregular periods  Maternal age  67-20 Para  3  Other Problems  Back Pain  High blood pressure     Review of Systems  General Not Present- Appetite Loss, Chills, Fatigue, Fever, Night Sweats, Weight Gain and Weight Loss. Skin Not Present- Change in Wart/Mole, Dryness, Hives, Jaundice, New Lesions, Non-Healing Wounds, Rash and Ulcer. HEENT Present- Wears glasses/contact lenses. Not Present- Earache, Hearing Loss, Hoarseness, Nose Bleed, Oral Ulcers, Ringing in the Ears, Seasonal Allergies, Sinus Pain, Sore Throat, Visual Disturbances and Yellow Eyes. Respiratory Not Present- Bloody sputum, Chronic Cough, Difficulty Breathing, Snoring and Wheezing. Breast Present- Breast Mass and Skin Changes. Not Present- Breast Pain and Nipple Discharge. Cardiovascular Not Present- Chest Pain, Difficulty Breathing Lying Down, Leg Cramps, Palpitations, Rapid Heart Rate, Shortness of Breath and Swelling of Extremities. Gastrointestinal Not Present- Abdominal Pain, Bloating, Bloody Stool, Change in Bowel Habits, Chronic diarrhea, Constipation, Difficulty  Swallowing, Excessive gas, Gets full quickly at meals, Hemorrhoids, Indigestion, Nausea, Rectal Pain and Vomiting. Female Genitourinary Not Present-  Frequency, Nocturia, Painful Urination, Pelvic Pain and Urgency. Musculoskeletal Not Present- Back Pain, Joint Pain, Joint Stiffness, Muscle Pain, Muscle Weakness and Swelling of Extremities. Neurological Not Present- Decreased Memory, Fainting, Headaches, Numbness, Seizures, Tingling, Tremor, Trouble walking and Weakness. Psychiatric Not Present- Anxiety, Bipolar, Change in Sleep Pattern, Depression, Fearful and Frequent crying. Endocrine Not Present- Cold Intolerance, Excessive Hunger, Hair Changes, Heat Intolerance, Hot flashes and New Diabetes. Hematology Not Present- Blood Thinners, Easy Bruising, Excessive bleeding, Gland problems, HIV and Persistent Infections.  Vitals Weight: 185.8 lb Height: 66in Body Surface Area: 1.94 m Body Mass Index: 29.99 kg/m  Temp.: 97.24F  Pulse: 84 (Regular)  BP: 132/78(Sitting, Left Arm, Standard)       Physical Exam  General Mental Status-Alert. General Appearance-Consistent with stated age. Hydration-Well hydrated. Voice-Normal.  Head and Neck Head-normocephalic, atraumatic with no lesions or palpable masses. Trachea-midline. Thyroid Gland Characteristics - normal size and consistency.  Eye Eyeball - Bilateral-Extraocular movements intact. Sclera/Conjunctiva - Bilateral-No scleral icterus.  Chest and Lung Exam Chest and lung exam reveals -quiet, even and easy respiratory effort with no use of accessory muscles and on auscultation, normal breath sounds, no adventitious sounds and normal vocal resonance. Inspection Chest Wall - Normal. Back - normal.  Breast Note: Breasts are medium size. Some ecchymoses and [place] palpable thickening at the 6 o'clock position behind the left areola, lower margin. No nipple discharge. No other masses in either breast. No axillary adenopathy.   Cardiovascular Cardiovascular examination reveals -normal heart sounds, regular rate and rhythm with no murmurs and normal pedal  pulses bilaterally.  Abdomen Inspection Inspection of the abdomen reveals - No Hernias. Skin - Scar - Note: Pfannenstiel incision. Palpation/Percussion Palpation and Percussion of the abdomen reveal - Soft, Non Tender, No Rebound tenderness, No Rigidity (guarding) and No hepatosplenomegaly. Auscultation Auscultation of the abdomen reveals - Bowel sounds normal.  Neurologic Neurologic evaluation reveals -alert and oriented x 3 with no impairment of recent or remote memory. Mental Status-Normal.  Musculoskeletal Normal Exam - Left-Upper Extremity Strength Normal and Lower Extremity Strength Normal. Normal Exam - Right-Upper Extremity Strength Normal and Lower Extremity Strength Normal.  Lymphatic Head & Neck  General Head & Neck Lymphatics: Bilateral - Description - Normal. Axillary  General Axillary Region: Bilateral - Description - Normal. Tenderness - Non Tender. Femoral & Inguinal  Generalized Femoral & Inguinal Lymphatics: Bilateral - Description - Normal. Tenderness - Non Tender.    Assessment & Plan   PAPILLOMA OF LEFT BREAST (D24.2) Your recent mammograms and needle core biopsy showed a papillary lesion of the left breast, 6 o'clock position, behind the nipple and areola Ultrasound of the left axilla shows normal looking lymph nodes. The risk of there being cancer in your breast is about 5% Excision of this area is recommended to be sure that you do not have cancer Most likely you do not have cancer You have stated that you would like to go ahead with excision to be sure and that is reasonable  you'll be scheduled for left breast lumpectomy with radioactive seed localization I discussed the indications, techniques, and risks of the surgery in detail with you and your husband   HYPERTENSION, BENIGN (I10)   H/O: HYSTERECTOMY (Z90.710)   FAMILY HISTORY OF BREAST CANCER (Z80.3) Impression: 2 paternal aunts had breast cancer. Details unknown.   SMOKES  TOBACCO DAILY (F17.200)   Edsel Petrin. Dalbert Batman, M.D.,  [facility] General and Minimally invasive Surgery Breast and Colorectal Surgery Office:   [phone] Pager:   [phone]

## 2019-02-25 ENCOUNTER — Ambulatory Visit
Admission: RE | Admit: 2019-02-25 | Discharge: 2019-02-25 | Disposition: A | Discharge status: Home / Self Care | Payer: Managed Care, Other (non HMO) | Source: Ambulatory Visit | Attending: General Surgery | Admitting: General Surgery

## 2019-02-25 ENCOUNTER — Other Ambulatory Visit: Payer: Self-pay

## 2019-02-25 DIAGNOSIS — D242 Benign neoplasm of left breast: Secondary | ICD-10-CM

## 2019-02-26 ENCOUNTER — Encounter (HOSPITAL_BASED_OUTPATIENT_CLINIC_OR_DEPARTMENT_OTHER)
Admission: RE | Disposition: A | Discharge status: Home / Self Care | Payer: Self-pay | Source: Home / Self Care | Attending: General Surgery

## 2019-02-26 ENCOUNTER — Ambulatory Visit (HOSPITAL_BASED_OUTPATIENT_CLINIC_OR_DEPARTMENT_OTHER): Payer: Managed Care, Other (non HMO) | Admitting: Anesthesiology

## 2019-02-26 ENCOUNTER — Ambulatory Visit
Admission: RE | Admit: 2019-02-26 | Discharge: 2019-02-26 | Disposition: A | Discharge status: Home / Self Care | Payer: Managed Care, Other (non HMO) | Source: Ambulatory Visit | Attending: General Surgery | Admitting: General Surgery

## 2019-02-26 ENCOUNTER — Other Ambulatory Visit: Payer: Self-pay

## 2019-02-26 ENCOUNTER — Ambulatory Visit (HOSPITAL_BASED_OUTPATIENT_CLINIC_OR_DEPARTMENT_OTHER)
Admission: RE | Admit: 2019-02-26 | Discharge: 2019-02-26 | Disposition: A | Discharge status: Home / Self Care | Payer: Managed Care, Other (non HMO) | Attending: General Surgery | Admitting: General Surgery

## 2019-02-26 ENCOUNTER — Encounter (HOSPITAL_BASED_OUTPATIENT_CLINIC_OR_DEPARTMENT_OTHER): Payer: Self-pay

## 2019-02-26 DIAGNOSIS — Z7982 Long term (current) use of aspirin: Secondary | ICD-10-CM | POA: Diagnosis not present

## 2019-02-26 DIAGNOSIS — F17210 Nicotine dependence, cigarettes, uncomplicated: Secondary | ICD-10-CM | POA: Insufficient documentation

## 2019-02-26 DIAGNOSIS — Z803 Family history of malignant neoplasm of breast: Secondary | ICD-10-CM | POA: Diagnosis not present

## 2019-02-26 DIAGNOSIS — I10 Essential (primary) hypertension: Secondary | ICD-10-CM | POA: Diagnosis not present

## 2019-02-26 DIAGNOSIS — N6489 Other specified disorders of breast: Secondary | ICD-10-CM | POA: Diagnosis not present

## 2019-02-26 DIAGNOSIS — Z79899 Other long term (current) drug therapy: Secondary | ICD-10-CM | POA: Diagnosis not present

## 2019-02-26 DIAGNOSIS — D242 Benign neoplasm of left breast: Secondary | ICD-10-CM | POA: Diagnosis present

## 2019-02-26 HISTORY — DX: Benign neoplasm of left breast: D24.2

## 2019-02-26 HISTORY — PX: BREAST EXCISIONAL BIOPSY: SUR124

## 2019-02-26 HISTORY — PX: BREAST LUMPECTOMY WITH RADIOACTIVE SEED LOCALIZATION: SHX6424

## 2019-02-26 SURGERY — BREAST LUMPECTOMY WITH RADIOACTIVE SEED LOCALIZATION
Anesthesia: General | Site: Breast | Laterality: Left

## 2019-02-26 MED ORDER — SCOPOLAMINE 1 MG/3DAYS TD PT72: 1.0000 | MEDICATED_PATCH | Freq: Once | TRANSDERMAL | Status: DC

## 2019-02-26 MED ORDER — GABAPENTIN 300 MG PO CAPS
ORAL_CAPSULE | ORAL | Status: AC
  Filled 2019-02-26: qty 1

## 2019-02-26 MED ORDER — MIDAZOLAM HCL 2 MG/2ML IJ SOLN: 1.0000 mg | INTRAMUSCULAR | Status: DC | PRN

## 2019-02-26 MED ORDER — LACTATED RINGERS IV SOLN: INTRAVENOUS | Status: DC

## 2019-02-26 MED ORDER — FENTANYL CITRATE (PF) 100 MCG/2ML IJ SOLN
25.0000 ug | INTRAMUSCULAR | Status: DC | PRN
  Administered 2019-02-26: 25 ug via INTRAVENOUS

## 2019-02-26 MED ORDER — MIDAZOLAM HCL 2 MG/2ML IJ SOLN
INTRAMUSCULAR | Status: AC
  Filled 2019-02-26: qty 2

## 2019-02-26 MED ORDER — DEXAMETHASONE SODIUM PHOSPHATE 10 MG/ML IJ SOLN
INTRAMUSCULAR | Status: AC
  Filled 2019-02-26: qty 1

## 2019-02-26 MED ORDER — FENTANYL CITRATE (PF) 250 MCG/5ML IJ SOLN
INTRAMUSCULAR | Status: DC | PRN
  Administered 2019-02-26: 25 ug via INTRAVENOUS

## 2019-02-26 MED ORDER — METOCLOPRAMIDE HCL 5 MG/ML IJ SOLN: 10.0000 mg | Freq: Once | INTRAMUSCULAR | Status: DC | PRN

## 2019-02-26 MED ORDER — CHLORHEXIDINE GLUCONATE CLOTH 2 % EX PADS: 6.0000 | MEDICATED_PAD | Freq: Once | CUTANEOUS | Status: DC

## 2019-02-26 MED ORDER — CELECOXIB 200 MG PO CAPS
ORAL_CAPSULE | ORAL | Status: AC
  Filled 2019-02-26: qty 1

## 2019-02-26 MED ORDER — FENTANYL CITRATE (PF) 100 MCG/2ML IJ SOLN: 50.0000 ug | INTRAMUSCULAR | Status: DC | PRN

## 2019-02-26 MED ORDER — MEPERIDINE HCL 25 MG/ML IJ SOLN: 6.2500 mg | INTRAMUSCULAR | Status: DC | PRN

## 2019-02-26 MED ORDER — LACTATED RINGERS IV SOLN
INTRAVENOUS | Status: DC
  Administered 2019-02-26: 08:00:00 via INTRAVENOUS

## 2019-02-26 MED ORDER — FENTANYL CITRATE (PF) 100 MCG/2ML IJ SOLN
INTRAMUSCULAR | Status: AC
  Filled 2019-02-26: qty 2

## 2019-02-26 MED ORDER — CEFAZOLIN SODIUM-DEXTROSE 2-4 GM/100ML-% IV SOLN
INTRAVENOUS | Status: AC
  Filled 2019-02-26: qty 100

## 2019-02-26 MED ORDER — CEFAZOLIN SODIUM-DEXTROSE 2-4 GM/100ML-% IV SOLN
2.0000 g | INTRAVENOUS | Status: AC
  Administered 2019-02-26: 2 g via INTRAVENOUS

## 2019-02-26 MED ORDER — CELECOXIB 200 MG PO CAPS
200.0000 mg | ORAL_CAPSULE | ORAL | Status: AC
  Administered 2019-02-26: 200 mg via ORAL

## 2019-02-26 MED ORDER — PROPOFOL 10 MG/ML IV BOLUS
INTRAVENOUS | Status: AC
  Filled 2019-02-26: qty 20

## 2019-02-26 MED ORDER — ONDANSETRON HCL 4 MG/2ML IJ SOLN
INTRAMUSCULAR | Status: DC | PRN
  Administered 2019-02-26: 4 mg via INTRAVENOUS

## 2019-02-26 MED ORDER — LIDOCAINE 2% (20 MG/ML) 5 ML SYRINGE
INTRAMUSCULAR | Status: DC | PRN
  Administered 2019-02-26: 100 mg via INTRAVENOUS

## 2019-02-26 MED ORDER — SODIUM CHLORIDE 0.9% FLUSH: 3.0000 mL | Freq: Two times a day (BID) | INTRAVENOUS | Status: DC

## 2019-02-26 MED ORDER — PHENYLEPHRINE 40 MCG/ML (10ML) SYRINGE FOR IV PUSH (FOR BLOOD PRESSURE SUPPORT)
PREFILLED_SYRINGE | INTRAVENOUS | Status: DC | PRN
  Administered 2019-02-26: 80 ug via INTRAVENOUS

## 2019-02-26 MED ORDER — ACETAMINOPHEN 500 MG PO TABS
1000.0000 mg | ORAL_TABLET | ORAL | Status: AC
  Administered 2019-02-26: 1000 mg via ORAL

## 2019-02-26 MED ORDER — HYDROCODONE-ACETAMINOPHEN 5-325 MG PO TABS: 1.0000 | ORAL_TABLET | Freq: Four times a day (QID) | ORAL | 0 refills | Status: DC | PRN

## 2019-02-26 MED ORDER — MIDAZOLAM HCL 5 MG/5ML IJ SOLN
INTRAMUSCULAR | Status: DC | PRN
  Administered 2019-02-26: 2 mg via INTRAVENOUS

## 2019-02-26 MED ORDER — ACETAMINOPHEN 500 MG PO TABS
ORAL_TABLET | ORAL | Status: AC
  Filled 2019-02-26: qty 2

## 2019-02-26 MED ORDER — GABAPENTIN 300 MG PO CAPS
300.0000 mg | ORAL_CAPSULE | ORAL | Status: AC
  Administered 2019-02-26: 300 mg via ORAL

## 2019-02-26 MED ORDER — BUPIVACAINE-EPINEPHRINE (PF) 0.5% -1:200000 IJ SOLN
INTRAMUSCULAR | Status: DC | PRN
  Administered 2019-02-26: 10 mL

## 2019-02-26 MED ORDER — PROPOFOL 10 MG/ML IV BOLUS
INTRAVENOUS | Status: DC | PRN
  Administered 2019-02-26: 160 mg via INTRAVENOUS

## 2019-02-26 MED ORDER — DEXAMETHASONE SODIUM PHOSPHATE 10 MG/ML IJ SOLN
INTRAMUSCULAR | Status: DC | PRN
  Administered 2019-02-26: 5 mg via INTRAVENOUS

## 2019-02-26 MED ORDER — DIPHENHYDRAMINE HCL 50 MG/ML IJ SOLN
INTRAMUSCULAR | Status: DC | PRN
  Administered 2019-02-26: 25 mg via INTRAVENOUS

## 2019-02-26 SURGICAL SUPPLY — 66 items
ADH SKN CLS APL DERMABOND .7 (GAUZE/BANDAGES/DRESSINGS) ×1
APL PRP STRL LF DISP 70% ISPRP (MISCELLANEOUS) ×1
APL SKNCLS STERI-STRIP NONHPOA (GAUZE/BANDAGES/DRESSINGS)
APPLIER CLIP 9.375 MED OPEN (MISCELLANEOUS) ×2
APR CLP MED 9.3 20 MLT OPN (MISCELLANEOUS) ×1
BENZOIN TINCTURE PRP APPL 2/3 (GAUZE/BANDAGES/DRESSINGS) IMPLANT
BINDER BREAST LRG (GAUZE/BANDAGES/DRESSINGS) IMPLANT
BINDER BREAST MEDIUM (GAUZE/BANDAGES/DRESSINGS) IMPLANT
BINDER BREAST XLRG (GAUZE/BANDAGES/DRESSINGS) ×1 IMPLANT
BINDER BREAST XXLRG (GAUZE/BANDAGES/DRESSINGS) IMPLANT
BLADE HEX COATED 2.75 (ELECTRODE) ×2 IMPLANT
BLADE SURG 10 STRL SS (BLADE) IMPLANT
BLADE SURG 15 STRL LF DISP TIS (BLADE) ×1 IMPLANT
BLADE SURG 15 STRL SS (BLADE) ×2
CANISTER SUC SOCK COL 7IN (MISCELLANEOUS) IMPLANT
CANISTER SUCT 1200ML W/VALVE (MISCELLANEOUS) ×2 IMPLANT
CHLORAPREP W/TINT 26 (MISCELLANEOUS) ×2 IMPLANT
CLIP APPLIE 9.375 MED OPEN (MISCELLANEOUS) IMPLANT
COVER BACK TABLE REUSABLE LG (DRAPES) ×2 IMPLANT
COVER MAYO STAND REUSABLE (DRAPES) ×2 IMPLANT
COVER PROBE W GEL 5X96 (DRAPES) ×2 IMPLANT
COVER WAND RF STERILE (DRAPES) IMPLANT
DECANTER SPIKE VIAL GLASS SM (MISCELLANEOUS) IMPLANT
DERMABOND ADVANCED (GAUZE/BANDAGES/DRESSINGS) ×1
DERMABOND ADVANCED .7 DNX12 (GAUZE/BANDAGES/DRESSINGS) ×1 IMPLANT
DRAPE LAPAROSCOPIC ABDOMINAL (DRAPES) ×2 IMPLANT
DRAPE UTILITY XL STRL (DRAPES) ×2 IMPLANT
DRSG PAD ABDOMINAL 8X10 ST (GAUZE/BANDAGES/DRESSINGS) ×2 IMPLANT
ELECT REM PT RETURN 9FT ADLT (ELECTROSURGICAL) ×2
ELECTRODE REM PT RTRN 9FT ADLT (ELECTROSURGICAL) ×1 IMPLANT
GAUZE SPONGE 4X4 12PLY STRL LF (GAUZE/BANDAGES/DRESSINGS) ×2 IMPLANT
GLOVE BIOGEL PI IND STRL 7.0 (GLOVE) IMPLANT
GLOVE BIOGEL PI INDICATOR 7.0 (GLOVE) ×1
GLOVE ECLIPSE 7.5 STRL STRAW (GLOVE) ×2 IMPLANT
GLOVE EUDERMIC 7 POWDERFREE (GLOVE) ×2 IMPLANT
GLOVE EXAM NITRILE MD LF STRL (GLOVE) ×1 IMPLANT
GOWN STRL REUS W/ TWL LRG LVL3 (GOWN DISPOSABLE) ×1 IMPLANT
GOWN STRL REUS W/ TWL XL LVL3 (GOWN DISPOSABLE) ×1 IMPLANT
GOWN STRL REUS W/TWL LRG LVL3 (GOWN DISPOSABLE)
GOWN STRL REUS W/TWL XL LVL3 (GOWN DISPOSABLE) ×4
ILLUMINATOR WAVEGUIDE N/F (MISCELLANEOUS) IMPLANT
KIT MARKER MARGIN INK (KITS) ×2 IMPLANT
LIGHT WAVEGUIDE WIDE FLAT (MISCELLANEOUS) IMPLANT
NDL HYPO 25X1 1.5 SAFETY (NEEDLE) ×1 IMPLANT
NEEDLE HYPO 25X1 1.5 SAFETY (NEEDLE) ×2 IMPLANT
NS IRRIG 1000ML POUR BTL (IV SOLUTION) ×2 IMPLANT
PACK BASIN DAY SURGERY FS (CUSTOM PROCEDURE TRAY) ×2 IMPLANT
PENCIL BUTTON HOLSTER BLD 10FT (ELECTRODE) ×2 IMPLANT
SHEET MEDIUM DRAPE 40X70 STRL (DRAPES) IMPLANT
SLEEVE SCD COMPRESS KNEE MED (MISCELLANEOUS) ×2 IMPLANT
SPONGE LAP 18X18 RF (DISPOSABLE) IMPLANT
SPONGE LAP 4X18 RFD (DISPOSABLE) ×2 IMPLANT
STRIP CLOSURE SKIN 1/2X4 (GAUZE/BANDAGES/DRESSINGS) IMPLANT
SUT ETHILON 3 0 FSL (SUTURE) IMPLANT
SUT MNCRL AB 4-0 PS2 18 (SUTURE) ×2 IMPLANT
SUT SILK 2 0 SH (SUTURE) ×2 IMPLANT
SUT VIC AB 2-0 CT1 27 (SUTURE)
SUT VIC AB 2-0 CT1 TAPERPNT 27 (SUTURE) IMPLANT
SUT VIC AB 3-0 SH 27 (SUTURE)
SUT VIC AB 3-0 SH 27X BRD (SUTURE) IMPLANT
SUT VICRYL 3-0 CR8 SH (SUTURE) ×2 IMPLANT
SYR 10ML LL (SYRINGE) ×2 IMPLANT
TOWEL GREEN STERILE FF (TOWEL DISPOSABLE) ×2 IMPLANT
TRAY FAXITRON CT DISP (TRAY / TRAY PROCEDURE) ×2 IMPLANT
TUBE CONNECTING 20X1/4 (TUBING) ×2 IMPLANT
YANKAUER SUCT BULB TIP NO VENT (SUCTIONS) ×2 IMPLANT

## 2019-02-26 NOTE — Discharge Instructions (Signed)
[place] Office Phone Number [phone]  BREAST BIOPSY/ PARTIAL MASTECTOMY: POST OP INSTRUCTIONS  Always review your discharge instruction sheet given to you by the facility where your surgery was performed.  IF YOU HAVE DISABILITY OR FAMILY LEAVE FORMS, YOU MUST BRING THEM TO THE OFFICE FOR PROCESSING.  DO NOT GIVE THEM TO YOUR DOCTOR.  1. A prescription for pain medication may be given to you upon discharge.  Take your pain medication as prescribed, if needed.  If narcotic pain medicine is not needed, then you may take acetaminophen (Tylenol) or ibuprofen (Advil) as needed. 2. Take your usually prescribed medications unless otherwise directed 3. If you need a refill on your pain medication, please contact your pharmacy.  They will contact our office to request authorization.  Prescriptions will not be filled after 5pm or on week-ends. 4. You should eat very light the first 24 hours after surgery, such as soup, crackers, pudding, etc.  Resume your normal diet the day after surgery. 5. Most patients will experience some swelling and bruising in the breast.  Ice packs and a good support bra will help.  Swelling and bruising can take several days to resolve.  6. It is common to experience some constipation if taking pain medication after surgery.  Increasing fluid intake and taking a stool softener will usually help or prevent this problem from occurring.  A mild laxative (Milk of Magnesia or Miralax) should be taken according to package directions if there are no bowel movements after 48 hours. 7. Unless discharge instructions indicate otherwise, you may remove your bandages 24-48 hours after surgery, and you may shower at that time.  You may have steri-strips (small skin tapes) in place directly over the incision.  These strips should be left on the skin for 7-10 days.  If your surgeon used skin glue on the incision, you may shower in 24 hours.  The glue will flake off over the  next 2-3 weeks.  Any sutures or staples will be removed at the office during your follow-up visit. 8. ACTIVITIES:  You may resume regular daily activities (gradually increasing) beginning the next day.  Wearing a good support bra or sports bra minimizes pain and swelling.  You may have sexual intercourse when it is comfortable. a. You may drive when you no longer are taking prescription pain medication, you can comfortably wear a seatbelt, and you can safely maneuver your car and apply brakes. b. RETURN TO WORK:  ______________________________________________________________________________________ 9. You should see your doctor in the office for a follow-up appointment approximately two weeks after your surgery.  Your doctors nurse will typically make your follow-up appointment when she calls you with your pathology report.  Expect your pathology report 2-3 business days after your surgery.  You may call to check if you do not hear from [place] after three days. 10. OTHER INSTRUCTIONS: _______________________________________________________________________________________________ _____________________________________________________________________________________________________________________________________ _____________________________________________________________________________________________________________________________________ _____________________________________________________________________________________________________________________________________  WHEN TO CALL YOUR DOCTOR: 1. Fever over 101.0 2. Nausea and/or vomiting. 3. Extreme swelling or bruising. 4. Continued bleeding from incision. 5. Increased pain, redness, or drainage from the incision.  The clinic staff is available to answer your questions during regular business hours.  Please dont hesitate to call and ask to speak to one of the nurses for clinical concerns.  If you have a medical emergency, go to the nearest  emergency room or call 911.  A surgeon from [facility] is always on call at the hospital.  For further questions, please visit [email or web address]  Managing Your Pain After Surgery Without Opioids    Thank you for participating in our program to help patients manage their pain after surgery without opioids. This is part of our effort to provide you with the best care possible, without exposing you or your family to the risk that opioids pose.  What pain can I expect after surgery? You can expect to have some pain after surgery. This is normal. The pain is typically worse the day after surgery, and quickly begins to get better. Many studies have found that many patients are able to manage their pain after surgery with Over-the-Counter (OTC) medications such as Tylenol and Motrin. If you have a condition that does not allow you to take Tylenol or Motrin, notify your surgical team.  How will I manage my pain? The best strategy for controlling your pain after surgery is around the clock pain control with Tylenol (acetaminophen) and Motrin (ibuprofen or Advil). Alternating these medications with each other allows you to maximize your pain control. In addition to Tylenol and Motrin, you can use heating pads or ice packs on your incisions to help reduce your pain.  How will I alternate your regular strength over-the-counter pain medication? You will take a dose of pain medication every three hours. ; Start by taking 650 mg of Tylenol (2 pills of 325 mg) ; 3 hours later take 600 mg of Motrin (3 pills of 200 mg) ; 3 hours after taking the Motrin take 650 mg of Tylenol ; 3 hours after that take 600 mg of Motrin.   - 1 -  See example - if your first dose of Tylenol is at 12:00 PM   12:00 PM Tylenol 650 mg (2 pills of 325 mg)  3:00 PM Motrin 600 mg (3 pills of 200 mg)  6:00 PM Tylenol 650 mg (2 pills of 325 mg)  9:00 PM Motrin 600 mg (3 pills of 200  mg)  Continue alternating every 3 hours   We recommend that you follow this schedule around-the-clock for at least 3 days after surgery, or until you feel that it is no longer needed. Use the table on the last page of this handout to keep track of the medications you are taking. Important: Do not take more than 3000mg  of Tylenol or 3200mg  of Motrin in a 24-hour period. Do not take ibuprofen/Motrin if you have a history of bleeding stomach ulcers, severe kidney disease, &/or actively taking a blood thinner  What if I still have pain? If you have pain that is not controlled with the over-the-counter pain medications (Tylenol and Motrin or Advil) you might have what we call breakthrough pain. You will receive a prescription for a small amount of an opioid pain medication such as Oxycodone, Tramadol, or Tylenol with Codeine. Use these opioid pills in the first 24 hours after surgery if you have breakthrough pain. Do not take more than 1 pill every 4-6 hours.  If you still have uncontrolled pain after using all opioid pills, don't hesitate to call our staff using the number provided. We will help make sure you are managing your pain in the best way possible, and if necessary, we can provide a prescription for additional pain medication.   Day 1    Time  Name of Medication Number of pills taken  Amount of Acetaminophen  Pain Level   Comments  AM PM       AM PM       AM PM  AM PM       AM PM       AM PM       AM PM       AM PM       Total Daily amount of Acetaminophen Do not take more than  3,000 mg per day      Day 2    Time  Name of Medication Number of pills taken  Amount of Acetaminophen  Pain Level   Comments  AM PM       AM PM       AM PM       AM PM       AM PM       AM PM       AM PM       AM PM       Total Daily amount of Acetaminophen Do not take more than  3,000 mg per day      Day 3    Time  Name of Medication Number of pills taken  Amount of  Acetaminophen  Pain Level   Comments  AM PM       AM PM       AM PM       AM PM          AM PM       AM PM       AM PM       AM PM       Total Daily amount of Acetaminophen Do not take more than  3,000 mg per day      Day 4    Time  Name of Medication Number of pills taken  Amount of Acetaminophen  Pain Level   Comments  AM PM       AM PM       AM PM       AM PM       AM PM       AM PM       AM PM       AM PM       Total Daily amount of Acetaminophen Do not take more than  3,000 mg per day      Day 5    Time  Name of Medication Number of pills taken  Amount of Acetaminophen  Pain Level   Comments  AM PM       AM PM       AM PM       AM PM       AM PM       AM PM       AM PM       AM PM       Total Daily amount of Acetaminophen Do not take more than  3,000 mg per day       Day 6    Time  Name of Medication Number of pills taken  Amount of Acetaminophen  Pain Level  Comments  AM PM       AM PM       AM PM       AM PM       AM PM       AM PM       AM PM       AM PM       Total Daily amount of Acetaminophen Do not take more than  3,000 mg per day      Day 7    Time  Name of Medication Number of pills taken  Amount of Acetaminophen  Pain Level   Comments  AM PM       AM PM       AM PM       AM PM       AM PM       AM PM       AM PM       AM PM       Total Daily amount of Acetaminophen Do not take more than  3,000 mg per day        For additional information about how and where to safely dispose of unused opioid medications - [employer]  Disclaimer: This document contains information and/or instructional materials adapted from [facility] for the typical patient with your condition. It does not replace medical advice from your health care provider because your experience may differ from that of the typical patient. Talk to your health care provider if you have any questions about  this document, your condition or your treatment plan. Adapted from [place] Instructions  Activity: Get plenty of rest for the remainder of the day. A responsible individual must stay with you for 24 hours following the procedure.  For the next 24 hours, DO NOT: -Drive a car -Paediatric nurse -Drink alcoholic beverages -Take any medication unless instructed by your physician -Make any legal decisions or sign important papers.  Meals: Start with liquid foods such as gelatin or soup. Progress to regular foods as tolerated. Avoid greasy, spicy, heavy foods. If nausea and/or vomiting occur, drink only clear liquids until the nausea and/or vomiting subsides. Call your physician if vomiting continues.  Special Instructions/Symptoms: Your throat may feel dry or sore from the anesthesia or the breathing tube placed in your throat during surgery. If this causes discomfort, gargle with warm salt water. The discomfort should disappear within 24 hours.  If you had a scopolamine patch placed behind your ear for the management of post- operative nausea and/or vomiting:  1. The medication in the patch is effective for 72 hours, after which it should be removed.  Wrap patch in a tissue and discard in the trash. Wash hands thoroughly with soap and water. 2. You may remove the patch earlier than 72 hours if you experience unpleasant side effects which may include dry mouth, dizziness or visual disturbances. 3. Avoid touching the patch. Wash your hands with soap and water after contact with the patch.

## 2019-02-26 NOTE — Anesthesia Postprocedure Evaluation (Signed)
Anesthesia Post Note  Patient: Mariah Schultz  Procedure(s) Performed: LEFT BREAST LUMPECTOMY WITH RADIOACTIVE SEED LOCALIZATION (Left Breast)     Patient location during evaluation: PACU Anesthesia Type: General Level of consciousness: awake and alert Pain management: pain level controlled Vital Signs Assessment: post-procedure vital signs reviewed and stable Respiratory status: spontaneous breathing, nonlabored ventilation, respiratory function stable and patient connected to nasal cannula oxygen Cardiovascular status: blood pressure returned to baseline and stable Postop Assessment: no apparent nausea or vomiting Anesthetic complications: no    Last Vitals:  Vitals:   02/26/19 1115 02/26/19 1139  BP: (!) 160/83 (!) 157/91  Pulse: (!) 57   Resp: 17 16  Temp:  36.6 C  SpO2: 97% 95%    Last Pain:  Vitals:   02/26/19 1139  TempSrc: Oral  PainSc: 2                  Montez Hageman

## 2019-02-26 NOTE — Anesthesia Procedure Notes (Signed)
Procedure Name: LMA Insertion Date/Time: 02/26/2019 9:22 AM Performed by: Myna Bright, CRNA Pre-anesthesia Checklist: Patient identified, Emergency Drugs available, Suction available and Patient being monitored Patient Re-evaluated:Patient Re-evaluated prior to induction Oxygen Delivery Method: Circle system utilized Preoxygenation: Pre-oxygenation with 100% oxygen Induction Type: IV induction Ventilation: Mask ventilation without difficulty LMA: LMA inserted LMA Size: 4.0 Number of attempts: 1 Placement Confirmation: positive ETCO2 and breath sounds checked- equal and bilateral Tube secured with: Tape Dental Injury: Teeth and Oropharynx as per pre-operative assessment

## 2019-02-26 NOTE — Transfer of Care (Signed)
Immediate Anesthesia Transfer of Care Note  Patient: Mariah Schultz  Procedure(s) Performed: LEFT BREAST LUMPECTOMY WITH RADIOACTIVE SEED LOCALIZATION (Left Breast)  Patient Location: PACU  Anesthesia Type:General  Level of Consciousness: sedated and responds to stimulation  Airway & Oxygen Therapy: Patient Spontanous Breathing and Patient connected to face mask oxygen  Post-op Assessment: Report given to RN, Post -op Vital signs reviewed and stable and Patient moving all extremities  Post vital signs: Reviewed and stable  Last Vitals:  Vitals Value Taken Time  BP 154/74 02/26/19 1025  Temp    Pulse 57 02/26/19 1030  Resp 21 02/26/19 1030  SpO2 100 % 02/26/19 1030  Vitals shown include unvalidated device data.  Last Pain:  Vitals:   02/26/19 0724  TempSrc: Oral  PainSc: 0-No pain         Complications: No apparent anesthesia complications

## 2019-02-26 NOTE — Op Note (Signed)
Patient Name:           Ingri Diemer   Date of Surgery:        02/26/2019  Pre op Diagnosis:      Papilloma left breast  Post op Diagnosis:    Same  Procedure:                 Left breast lumpectomy with radioactive seed localization and margin assessment  Surgeon:                     Edsel Petrin. Dalbert Batman, M.D., FACS  Assistant:                      Or staff  Operative Indications:        This is a 63 year old female, referred by Dr. 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 Imaging studies were done at [facility] by Dr. Peggye Fothergill.      Only history is a core biopsy of a right breast which was benign fatty tissue. She gets mammograms intermittently. Recent mammogram show a 5 mm mass in the left breast, subareolar area, 6 o'clock position. Left axillary ultrasound negative. Core biopsy shows papillary lesion. No nipple discharge      Past history reveals the core biopsy right breast for benign disease.  Family history revealed 2 paternal aunts with breast cancer. Otherwise no cancer syndromes. Mother and father are deceased but died of heart disease and no cancer      I advised her that she probably does not have cancer, but this is classified as a high-grade lesion with a risk of cancer upgrade on excision to about 5%. We had a lengthy discussion. Because of the family history she does not want to take the chance and wants to schedule left breast lumpectomy. She'll be scheduled for left breast lumpectomy with radioactive seed localization.  Operative Findings:       The original biopsy marker clip and the radioactive seed were immediately adjacent to each other in the middle depth of the left breast centrally.  I made a curvilinear incision laterally at the areolar margin, hidden scar technique.  The specimen mammogram looked good showing the marker clip and the seed in the center of the specimen.  Procedure in Detail:          Following the induction of general LMA  anesthesia the patient's left breast was prepped and draped in a sterile fashion.  Surgical timeout was performed.  Intravenous antibiotics were given.  0.5% Marcaine with epinephrine was used as a local infiltration anesthetic.     Using the neoprobe I isolated the radioactive signal which was in the infero-lateral retroareolar area.  Incision was carefully planned and marked and made at the areolar margin.  Lumpectomy was performed using electrocautery and the neoprobe.  The specimen was removed and marked with silk sutures and a 6 color ink kit to orient the pathologist.  The specimen mammogram looked good as described above and was sent to the lab where the seed was retrieved.  The wound was irrigated.  Hemostasis was excellent.  5 metal marker clips were placed in the walls of the lumpectomy cavity.  The lumpectomy cavity was closed with interrupted sutures of 3-0 Vicryl and the skin closed with a running subcuticular 4-0 Monocryl and Dermabond.  Dry bandages and a breast binder were placed.  The patient tolerated the procedure well and was taken to PACU in  stable condition.  EBL 10 cc.  Counts correct.  Complications none.    Addendum: I logged onto the PMP aware website and reviewed her prescription medication history     Shaylie Eklund M. Dalbert Batman, M.D., FACS General and Minimally Invasive Surgery Breast and Colorectal Surgery  02/26/2019 10:15 AM

## 2019-02-26 NOTE — Interval H&P Note (Signed)
History and Physical Interval Note:  02/26/2019 8:40 AM  Mariah Schultz  has presented today for surgery, with the diagnosis of LEFT BREAST PAPILLOMA.  The various methods of treatment have been discussed with the patient and family. After consideration of risks, benefits and other options for treatment, the patient has consented to  Procedure(s): LEFT BREAST LUMPECTOMY WITH RADIOACTIVE SEED LOCALIZATION (Left) as a surgical intervention.  The patient's history has been reviewed, patient examined, no change in status, stable for surgery.  I have reviewed the patient's chart and labs.  Questions were answered to the patient's satisfaction.     Adin Hector

## 2019-02-26 NOTE — Anesthesia Preprocedure Evaluation (Signed)
Anesthesia Evaluation  Patient identified by MRN, date of birth, ID band Patient awake    Reviewed: Allergy & Precautions, NPO status , Patient's Chart, lab work & pertinent test results  Airway Mallampati: II  TM Distance: >3 FB Neck ROM: Full    Dental no notable dental hx.    Pulmonary neg pulmonary ROS, Current Smoker,    Pulmonary exam normal breath sounds clear to auscultation       Cardiovascular hypertension, Pt. on medications negative cardio ROS Normal cardiovascular exam Rhythm:Regular Rate:Normal     Neuro/Psych negative neurological ROS  negative psych ROS   GI/Hepatic negative GI ROS, Neg liver ROS,   Endo/Other  negative endocrine ROS  Renal/GU negative Renal ROS  negative genitourinary   Musculoskeletal negative musculoskeletal ROS (+)   Abdominal   Peds negative pediatric ROS (+)  Hematology negative hematology ROS (+)   Anesthesia Other Findings   Reproductive/Obstetrics negative OB ROS                             Anesthesia Physical Anesthesia Plan  ASA: II  Anesthesia Plan: General   Post-op Pain Management:    Induction: Intravenous  PONV Risk Score and Plan: 2 and Treatment may vary due to age or medical condition, Ondansetron and Dexamethasone  Airway Management Planned: LMA  Additional Equipment:   Intra-op Plan:   Post-operative Plan: Extubation in OR  Informed Consent: I have reviewed the patients History and Physical, chart, labs and discussed the procedure including the risks, benefits and alternatives for the proposed anesthesia with the patient or authorized representative who has indicated his/her understanding and acceptance.     Dental advisory given  Plan Discussed with: CRNA  Anesthesia Plan Comments:         Anesthesia Quick Evaluation

## 2019-02-27 ENCOUNTER — Encounter (HOSPITAL_BASED_OUTPATIENT_CLINIC_OR_DEPARTMENT_OTHER): Payer: Self-pay | Admitting: General Surgery

## 2019-02-27 NOTE — Progress Notes (Signed)
Inform patient of Pathology report,. Breast pathology is completely benign.  Papilloma was found but no malignancy. This is excellent news I can discuss this with her at the next office visit Let me know that you contacted her Thanks  Dalbert Batman

## 2019-10-28 DIAGNOSIS — E7800 Pure hypercholesterolemia, unspecified: Secondary | ICD-10-CM | POA: Diagnosis not present

## 2019-10-28 DIAGNOSIS — I10 Essential (primary) hypertension: Secondary | ICD-10-CM | POA: Diagnosis not present

## 2019-10-28 DIAGNOSIS — Z0000 Encounter for general adult medical examination without abnormal findings: Secondary | ICD-10-CM | POA: Diagnosis not present

## 2019-10-28 DIAGNOSIS — Z1389 Encounter for screening for other disorder: Secondary | ICD-10-CM | POA: Diagnosis not present

## 2019-10-28 DIAGNOSIS — F17210 Nicotine dependence, cigarettes, uncomplicated: Secondary | ICD-10-CM | POA: Diagnosis not present

## 2019-11-01 DIAGNOSIS — R7309 Other abnormal glucose: Secondary | ICD-10-CM | POA: Diagnosis not present

## 2020-02-05 DIAGNOSIS — Z1159 Encounter for screening for other viral diseases: Secondary | ICD-10-CM | POA: Diagnosis not present

## 2020-02-10 DIAGNOSIS — K648 Other hemorrhoids: Secondary | ICD-10-CM | POA: Diagnosis not present

## 2020-02-10 DIAGNOSIS — K5730 Diverticulosis of large intestine without perforation or abscess without bleeding: Secondary | ICD-10-CM | POA: Diagnosis not present

## 2020-02-10 DIAGNOSIS — D123 Benign neoplasm of transverse colon: Secondary | ICD-10-CM | POA: Diagnosis not present

## 2020-02-10 DIAGNOSIS — K635 Polyp of colon: Secondary | ICD-10-CM | POA: Diagnosis not present

## 2020-02-10 DIAGNOSIS — Z1211 Encounter for screening for malignant neoplasm of colon: Secondary | ICD-10-CM | POA: Diagnosis not present

## 2020-02-12 DIAGNOSIS — D123 Benign neoplasm of transverse colon: Secondary | ICD-10-CM | POA: Diagnosis not present

## 2020-02-12 DIAGNOSIS — K635 Polyp of colon: Secondary | ICD-10-CM | POA: Diagnosis not present

## 2020-02-27 DIAGNOSIS — R7309 Other abnormal glucose: Secondary | ICD-10-CM | POA: Diagnosis not present

## 2020-08-03 ENCOUNTER — Other Ambulatory Visit: Payer: Self-pay | Admitting: Internal Medicine

## 2020-08-03 DIAGNOSIS — Z1231 Encounter for screening mammogram for malignant neoplasm of breast: Secondary | ICD-10-CM

## 2020-09-15 ENCOUNTER — Other Ambulatory Visit: Payer: Self-pay | Admitting: Internal Medicine

## 2020-09-15 DIAGNOSIS — N644 Mastodynia: Secondary | ICD-10-CM

## 2020-09-22 DIAGNOSIS — H524 Presbyopia: Secondary | ICD-10-CM | POA: Diagnosis not present

## 2020-09-22 DIAGNOSIS — Z135 Encounter for screening for eye and ear disorders: Secondary | ICD-10-CM | POA: Diagnosis not present

## 2020-09-22 DIAGNOSIS — Z0100 Encounter for examination of eyes and vision without abnormal findings: Secondary | ICD-10-CM | POA: Diagnosis not present

## 2020-09-22 DIAGNOSIS — H11159 Pinguecula, unspecified eye: Secondary | ICD-10-CM | POA: Diagnosis not present

## 2020-09-22 DIAGNOSIS — H1045 Other chronic allergic conjunctivitis: Secondary | ICD-10-CM | POA: Diagnosis not present

## 2020-10-23 ENCOUNTER — Ambulatory Visit
Admission: RE | Admit: 2020-10-23 | Discharge: 2020-10-23 | Disposition: A | Discharge status: Home / Self Care | Payer: Managed Care, Other (non HMO) | Source: Ambulatory Visit | Attending: Internal Medicine | Admitting: Internal Medicine

## 2020-10-23 ENCOUNTER — Other Ambulatory Visit: Payer: Self-pay

## 2020-10-23 DIAGNOSIS — N644 Mastodynia: Secondary | ICD-10-CM | POA: Diagnosis not present

## 2020-10-23 DIAGNOSIS — R922 Inconclusive mammogram: Secondary | ICD-10-CM | POA: Diagnosis not present

## 2020-11-16 DIAGNOSIS — F17210 Nicotine dependence, cigarettes, uncomplicated: Secondary | ICD-10-CM | POA: Diagnosis not present

## 2020-11-16 DIAGNOSIS — Z0001 Encounter for general adult medical examination with abnormal findings: Secondary | ICD-10-CM | POA: Diagnosis not present

## 2020-11-16 DIAGNOSIS — M2550 Pain in unspecified joint: Secondary | ICD-10-CM | POA: Diagnosis not present

## 2020-11-16 DIAGNOSIS — I10 Essential (primary) hypertension: Secondary | ICD-10-CM | POA: Diagnosis not present

## 2020-11-16 DIAGNOSIS — E7800 Pure hypercholesterolemia, unspecified: Secondary | ICD-10-CM | POA: Diagnosis not present

## 2020-11-16 DIAGNOSIS — E1169 Type 2 diabetes mellitus with other specified complication: Secondary | ICD-10-CM | POA: Diagnosis not present

## 2020-11-16 DIAGNOSIS — I739 Peripheral vascular disease, unspecified: Secondary | ICD-10-CM | POA: Diagnosis not present

## 2020-11-24 ENCOUNTER — Other Ambulatory Visit: Payer: Self-pay | Admitting: Internal Medicine

## 2020-11-24 DIAGNOSIS — I739 Peripheral vascular disease, unspecified: Secondary | ICD-10-CM

## 2020-11-30 ENCOUNTER — Ambulatory Visit
Admission: RE | Admit: 2020-11-30 | Discharge: 2020-11-30 | Disposition: A | Discharge status: Home / Self Care | Payer: Medicare HMO | Source: Ambulatory Visit | Attending: Internal Medicine | Admitting: Internal Medicine

## 2020-11-30 DIAGNOSIS — Z8679 Personal history of other diseases of the circulatory system: Secondary | ICD-10-CM | POA: Diagnosis not present

## 2020-11-30 DIAGNOSIS — I739 Peripheral vascular disease, unspecified: Secondary | ICD-10-CM

## 2021-04-13 ENCOUNTER — Other Ambulatory Visit: Payer: Self-pay | Admitting: Internal Medicine

## 2021-04-13 ENCOUNTER — Ambulatory Visit
Admission: RE | Admit: 2021-04-13 | Discharge: 2021-04-13 | Disposition: A | Discharge status: Home / Self Care | Payer: Medicare HMO | Source: Ambulatory Visit | Attending: Internal Medicine | Admitting: Internal Medicine

## 2021-04-13 DIAGNOSIS — R109 Unspecified abdominal pain: Secondary | ICD-10-CM | POA: Diagnosis not present

## 2021-04-13 DIAGNOSIS — R140 Abdominal distension (gaseous): Secondary | ICD-10-CM | POA: Diagnosis not present

## 2021-04-13 DIAGNOSIS — R198 Other specified symptoms and signs involving the digestive system and abdomen: Secondary | ICD-10-CM | POA: Diagnosis not present

## 2021-04-22 ENCOUNTER — Other Ambulatory Visit: Payer: Self-pay | Admitting: Internal Medicine

## 2021-04-22 DIAGNOSIS — R109 Unspecified abdominal pain: Secondary | ICD-10-CM

## 2021-05-11 ENCOUNTER — Ambulatory Visit
Admission: RE | Admit: 2021-05-11 | Discharge: 2021-05-11 | Disposition: A | Discharge status: Home / Self Care | Payer: Medicare HMO | Source: Ambulatory Visit | Attending: Internal Medicine | Admitting: Internal Medicine

## 2021-05-11 ENCOUNTER — Other Ambulatory Visit: Payer: Self-pay

## 2021-05-11 DIAGNOSIS — R1900 Intra-abdominal and pelvic swelling, mass and lump, unspecified site: Secondary | ICD-10-CM | POA: Diagnosis not present

## 2021-05-11 DIAGNOSIS — K5730 Diverticulosis of large intestine without perforation or abscess without bleeding: Secondary | ICD-10-CM | POA: Diagnosis not present

## 2021-05-11 DIAGNOSIS — I700 Atherosclerosis of aorta: Secondary | ICD-10-CM | POA: Diagnosis not present

## 2021-05-11 DIAGNOSIS — R109 Unspecified abdominal pain: Secondary | ICD-10-CM

## 2021-05-11 DIAGNOSIS — Z9049 Acquired absence of other specified parts of digestive tract: Secondary | ICD-10-CM | POA: Diagnosis not present

## 2021-05-11 MED ORDER — IOPAMIDOL (ISOVUE-300) INJECTION 61%
100.0000 mL | Freq: Once | INTRAVENOUS | Status: AC | PRN
  Administered 2021-05-11: 100 mL via INTRAVENOUS

## 2021-05-14 DIAGNOSIS — R195 Other fecal abnormalities: Secondary | ICD-10-CM | POA: Diagnosis not present

## 2021-05-14 DIAGNOSIS — R935 Abnormal findings on diagnostic imaging of other abdominal regions, including retroperitoneum: Secondary | ICD-10-CM | POA: Diagnosis not present

## 2021-05-20 DIAGNOSIS — R933 Abnormal findings on diagnostic imaging of other parts of digestive tract: Secondary | ICD-10-CM | POA: Diagnosis not present

## 2021-05-20 DIAGNOSIS — R194 Change in bowel habit: Secondary | ICD-10-CM | POA: Diagnosis not present

## 2021-05-24 DIAGNOSIS — R933 Abnormal findings on diagnostic imaging of other parts of digestive tract: Secondary | ICD-10-CM | POA: Diagnosis not present

## 2021-05-24 DIAGNOSIS — K922 Gastrointestinal hemorrhage, unspecified: Secondary | ICD-10-CM | POA: Diagnosis not present

## 2021-05-24 DIAGNOSIS — C189 Malignant neoplasm of colon, unspecified: Secondary | ICD-10-CM | POA: Diagnosis not present

## 2021-05-24 DIAGNOSIS — R195 Other fecal abnormalities: Secondary | ICD-10-CM | POA: Diagnosis not present

## 2021-05-24 DIAGNOSIS — K529 Noninfective gastroenteritis and colitis, unspecified: Secondary | ICD-10-CM | POA: Diagnosis not present

## 2021-05-24 DIAGNOSIS — K648 Other hemorrhoids: Secondary | ICD-10-CM | POA: Diagnosis not present

## 2021-05-24 DIAGNOSIS — C19 Malignant neoplasm of rectosigmoid junction: Secondary | ICD-10-CM | POA: Diagnosis not present

## 2021-05-24 DIAGNOSIS — K6389 Other specified diseases of intestine: Secondary | ICD-10-CM | POA: Diagnosis not present

## 2021-06-01 DIAGNOSIS — C189 Malignant neoplasm of colon, unspecified: Secondary | ICD-10-CM | POA: Diagnosis not present

## 2021-06-02 ENCOUNTER — Other Ambulatory Visit: Payer: Self-pay | Admitting: Gastroenterology

## 2021-06-02 ENCOUNTER — Other Ambulatory Visit (HOSPITAL_COMMUNITY): Payer: Self-pay | Admitting: Gastroenterology

## 2021-06-02 DIAGNOSIS — R935 Abnormal findings on diagnostic imaging of other abdominal regions, including retroperitoneum: Secondary | ICD-10-CM

## 2021-06-04 ENCOUNTER — Ambulatory Visit (HOSPITAL_COMMUNITY)
Admission: RE | Admit: 2021-06-04 | Discharge: 2021-06-04 | Disposition: A | Discharge status: Home / Self Care | Payer: Medicare HMO | Source: Ambulatory Visit | Attending: Gastroenterology | Admitting: Gastroenterology

## 2021-06-04 ENCOUNTER — Other Ambulatory Visit: Payer: Self-pay

## 2021-06-04 DIAGNOSIS — R7309 Other abnormal glucose: Secondary | ICD-10-CM | POA: Diagnosis not present

## 2021-06-04 DIAGNOSIS — R918 Other nonspecific abnormal finding of lung field: Secondary | ICD-10-CM | POA: Diagnosis not present

## 2021-06-04 DIAGNOSIS — R1900 Intra-abdominal and pelvic swelling, mass and lump, unspecified site: Secondary | ICD-10-CM | POA: Diagnosis not present

## 2021-06-04 DIAGNOSIS — I700 Atherosclerosis of aorta: Secondary | ICD-10-CM | POA: Diagnosis not present

## 2021-06-04 DIAGNOSIS — R935 Abnormal findings on diagnostic imaging of other abdominal regions, including retroperitoneum: Secondary | ICD-10-CM | POA: Insufficient documentation

## 2021-06-04 DIAGNOSIS — K6289 Other specified diseases of anus and rectum: Secondary | ICD-10-CM | POA: Diagnosis not present

## 2021-06-04 LAB — GLUCOSE, CAPILLARY: Glucose-Capillary: 113 mg/dL — ABNORMAL HIGH (ref 70–99)

## 2021-06-04 MED ORDER — FLUDEOXYGLUCOSE F - 18 (FDG) INJECTION
9.4000 | Freq: Once | INTRAVENOUS | Status: AC | PRN
  Administered 2021-06-04: 8.48 via INTRAVENOUS

## 2021-06-08 ENCOUNTER — Telehealth: Payer: Self-pay | Admitting: Hematology and Oncology

## 2021-06-08 ENCOUNTER — Encounter: Payer: Self-pay | Admitting: Surgery

## 2021-06-08 DIAGNOSIS — C19 Malignant neoplasm of rectosigmoid junction: Secondary | ICD-10-CM | POA: Insufficient documentation

## 2021-06-08 NOTE — Telephone Encounter (Signed)
Scheduled appt per 10/4 referral. Pt is aware of appt date and time.

## 2021-06-09 DIAGNOSIS — C20 Malignant neoplasm of rectum: Secondary | ICD-10-CM | POA: Diagnosis not present

## 2021-06-10 ENCOUNTER — Telehealth: Payer: Self-pay | Admitting: Radiation Oncology

## 2021-06-10 NOTE — Telephone Encounter (Signed)
Called patient to schedule consultation with Dr. Moody. No answer, LVM for return call. 

## 2021-06-11 ENCOUNTER — Inpatient Hospital Stay: Payer: Medicare HMO

## 2021-06-11 ENCOUNTER — Telehealth: Payer: Self-pay | Admitting: Oncology

## 2021-06-11 ENCOUNTER — Other Ambulatory Visit: Payer: Self-pay

## 2021-06-11 ENCOUNTER — Other Ambulatory Visit: Payer: Self-pay | Admitting: Surgery

## 2021-06-11 ENCOUNTER — Inpatient Hospital Stay: Payer: Medicare HMO | Attending: Hematology and Oncology | Admitting: Hematology and Oncology

## 2021-06-11 VITALS — BP 137/78 | HR 80 | Temp 98.1°F | Resp 17 | Ht 64.5 in | Wt 160.7 lb

## 2021-06-11 DIAGNOSIS — D375 Neoplasm of uncertain behavior of rectum: Secondary | ICD-10-CM | POA: Diagnosis not present

## 2021-06-11 DIAGNOSIS — I10 Essential (primary) hypertension: Secondary | ICD-10-CM | POA: Insufficient documentation

## 2021-06-11 DIAGNOSIS — R9389 Abnormal findings on diagnostic imaging of other specified body structures: Secondary | ICD-10-CM | POA: Insufficient documentation

## 2021-06-11 DIAGNOSIS — C480 Malignant neoplasm of retroperitoneum: Secondary | ICD-10-CM | POA: Diagnosis not present

## 2021-06-11 DIAGNOSIS — R6881 Early satiety: Secondary | ICD-10-CM | POA: Diagnosis not present

## 2021-06-11 DIAGNOSIS — R194 Change in bowel habit: Secondary | ICD-10-CM | POA: Diagnosis not present

## 2021-06-11 DIAGNOSIS — G893 Neoplasm related pain (acute) (chronic): Secondary | ICD-10-CM | POA: Insufficient documentation

## 2021-06-11 DIAGNOSIS — C20 Malignant neoplasm of rectum: Secondary | ICD-10-CM

## 2021-06-11 DIAGNOSIS — C801 Malignant (primary) neoplasm, unspecified: Secondary | ICD-10-CM | POA: Diagnosis not present

## 2021-06-11 DIAGNOSIS — R1900 Intra-abdominal and pelvic swelling, mass and lump, unspecified site: Secondary | ICD-10-CM | POA: Insufficient documentation

## 2021-06-11 DIAGNOSIS — Z780 Asymptomatic menopausal state: Secondary | ICD-10-CM | POA: Insufficient documentation

## 2021-06-11 DIAGNOSIS — F17210 Nicotine dependence, cigarettes, uncomplicated: Secondary | ICD-10-CM | POA: Diagnosis not present

## 2021-06-11 DIAGNOSIS — K6289 Other specified diseases of anus and rectum: Secondary | ICD-10-CM

## 2021-06-11 DIAGNOSIS — Z79899 Other long term (current) drug therapy: Secondary | ICD-10-CM | POA: Diagnosis not present

## 2021-06-11 LAB — CBC WITH DIFFERENTIAL (CANCER CENTER ONLY)
Abs Immature Granulocytes: 0.02 10*3/uL (ref 0.00–0.07)
Basophils Absolute: 0 10*3/uL (ref 0.0–0.1)
Basophils Relative: 1 %
Eosinophils Absolute: 0.4 10*3/uL (ref 0.0–0.5)
Eosinophils Relative: 5 %
HCT: 42.1 % (ref 36.0–46.0)
Hemoglobin: 14.2 g/dL (ref 12.0–15.0)
Immature Granulocytes: 0 %
Lymphocytes Relative: 20 %
Lymphs Abs: 1.7 10*3/uL (ref 0.7–4.0)
MCH: 30.6 pg (ref 26.0–34.0)
MCHC: 33.7 g/dL (ref 30.0–36.0)
MCV: 90.7 fL (ref 80.0–100.0)
Monocytes Absolute: 0.7 10*3/uL (ref 0.1–1.0)
Monocytes Relative: 8 %
Neutro Abs: 5.7 10*3/uL (ref 1.7–7.7)
Neutrophils Relative %: 66 %
Platelet Count: 334 10*3/uL (ref 150–400)
RBC: 4.64 MIL/uL (ref 3.87–5.11)
RDW: 14.4 % (ref 11.5–15.5)
WBC Count: 8.6 10*3/uL (ref 4.0–10.5)
nRBC: 0 % (ref 0.0–0.2)

## 2021-06-11 LAB — CMP (CANCER CENTER ONLY)
ALT: 16 U/L (ref 0–44)
AST: 17 U/L (ref 15–41)
Albumin: 3.5 g/dL (ref 3.5–5.0)
Alkaline Phosphatase: 149 U/L — ABNORMAL HIGH (ref 38–126)
Anion gap: 10 (ref 5–15)
BUN: 13 mg/dL (ref 8–23)
CO2: 27 mmol/L (ref 22–32)
Calcium: 10.1 mg/dL (ref 8.9–10.3)
Chloride: 103 mmol/L (ref 98–111)
Creatinine: 0.74 mg/dL (ref 0.44–1.00)
GFR, Estimated: >60 mL/min (ref >60)
Glucose, Bld: 107 mg/dL — ABNORMAL HIGH (ref 70–99)
Potassium: 3.8 mmol/L (ref 3.5–5.1)
Sodium: 140 mmol/L (ref 135–145)
Total Bilirubin: 0.4 mg/dL (ref 0.3–1.2)
Total Protein: 7.8 g/dL (ref 6.5–8.1)

## 2021-06-11 LAB — CEA (IN HOUSE-CHCC): CEA (CHCC-In House): 3.19 ng/mL (ref 0.00–5.00)

## 2021-06-11 LAB — LACTATE DEHYDROGENASE: LDH: 187 U/L (ref 98–192)

## 2021-06-11 MED ORDER — TRAMADOL HCL 50 MG PO TABS: 50.0000 mg | ORAL_TABLET | Freq: Three times a day (TID) | ORAL | 0 refills | Status: DC | PRN

## 2021-06-11 NOTE — Progress Notes (Signed)
I met with Mariah Schultz and her husband after her consult with Dr Lorenso Courier.  I let them know that [name] will be her Navigator as she will follow up with Dr Alvy Bimler.  I did provided my direct contact information should she have any questions or concerns until seen by Dr Alvy Bimler.  All questions were answered and they verbalized understanding.

## 2021-06-11 NOTE — Telephone Encounter (Signed)
Called [facility] and requested clarification on the staining from her biopsy on 05/24/21.  They will fax the finalized report this afternoon.

## 2021-06-11 NOTE — Progress Notes (Signed)
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 Findings showed a 7.5 x 6.9 cm heterogeneously enhancing mass is noted posteriorly in the pelvis anterior to the sacrum which is highly concerning for colorectal malignancy with possible adjacent metastatic adenopathy. 05/24/2021: colonoscopy performed by [facility]. Biopsy showed poorly differentiated high grade carcinoma, differential including tuboovarian and peritoneal. Immunophemotype not consistent with colorectal adenocarcinoma.  06/04/2021: PET CT scan performed, findings show hypermetabolic mass adjacent to the rectum, likely due to colorectal primary neoplasm and ill-defined hypermetabolic nodule is seen more inferiorly in the left perirectal fat, likely a lymph node metastasis. She was also noted to have Numerous small bilateral small solid pulmonary nodules which are below resolution for PET-CT 06/11/2021: establish care with Dr. Lorenso Courier   CHIEF COMPLAINTS/PURPOSE OF CONSULTATION:  "Poorly Differentiated High Grade Carcinoma "  HISTORY OF PRESENTING ILLNESS:  Mariah Schultz 65 y.o. female with medical history significant for hypertension who presents for evaluation of a newly diagnosed with differentiated high-grade carcinoma in the pelvis.  On review of the previous records  [name] underwent a CT abdomen pelvis on 05/12/2021 due to abdominal bloating and discomfort.  Findings showed a 7.5 x 6.9 heterogeneously enhancing mass in the posterior pelvis anterior to the sacrum highly concerning for colorectal cancer.  Colonoscopy was performed on 05/24/2021 by [facility] with biopsy showing poorly differentiated high-grade carcinoma, potentially tubo-ovarian versus peritoneal.  The immunophenotyping was not consistent with colorectal adenocarcinoma.  A PET CT scan was performed on 06/04/2021 which showed a hypermetabolic mass adjacent to the rectum and there is also noted to be numerous small bilateral pulmonary nodules.  Due to concern for these findings the patient was referred to oncology for further evaluation and management.  On exam today [name] is accompanied by her husband.  She notes that her symptoms began in August when she began experiencing pressure in the upper part of her rectum.  She also began developing changes in her bowel movements with thin stools.  She underwent blood tests and x-rays which showed no concerning abnormalities.  When symptoms worsen she underwent a CT scan which began the above work-up.  She denies having any overt blood in the stool.  On further discussion she reports her family history is remarkable for lung cancer in a maternal uncle and an aneurysm and hypertension in maternal grandmother.  Her mother and father do not have any remarkable medical conditions.  She notes that she is an active smoker smoking about 1pack/day.  She drinks alcohol about 1 glass of wine per day.  She is a retired Tax inspector.  She does endorse continuing to have abdominal discomfort and feeling of not adequately voiding.  She is having thin stools and some difficulty sleeping.  She otherwise denies any fevers, chills, sweats, nausea, vomiting or diarrhea.  Full 10 point ROS is listed below.  MEDICAL HISTORY:  Past Medical History:  Diagnosis Date   Hypertension     Papilloma of left breast  Papilloma of left breast 02/26/2019    SURGICAL HISTORY: Past Surgical History:  Procedure Laterality Date   ABDOMINAL HYSTERECTOMY     APPENDECTOMY     BREAST EXCISIONAL BIOPSY Left 02/26/2019   B9 Biopsy   BREAST LUMPECTOMY WITH RADIOACTIVE SEED LOCALIZATION Left 02/26/2019   Procedure: LEFT BREAST LUMPECTOMY WITH RADIOACTIVE SEED LOCALIZATION;  Surgeon: Fanny Skates, MD;  Location: [facility];  Service: General;  Laterality: Left;   LAPAROSCOPIC APPENDECTOMY N/A 02/25/2013   Procedure: APPENDECTOMY LAPAROSCOPIC;  Surgeon: Harl Bowie, MD;  Location: [facility];  Service: General;  Laterality: N/A;    SOCIAL HISTORY: Social History   Socioeconomic History   Marital status: Married    Spouse name: Not on file   Number of children: Not on file   Years of education: Not on file   Highest education level: Not on file  Occupational History   Not on file  Tobacco Use   Smoking status: Every Day    Packs/day: 1.00    Types: Cigarettes   Smokeless tobacco: Never  Substance and Sexual Activity   Alcohol use: Yes    Comment: occ   Drug use: No   Sexual activity: Not on file  Other Topics Concern   Not on file  Social History Narrative   Not on file   Social Determinants of Health   Financial Resource Strain: Not on file  Food Insecurity: Not on file  Transportation Needs: Not on file  Physical Activity: Not on file  Stress: Not on file  Social Connections: Not on file  Intimate Partner Violence: Not on file    FAMILY HISTORY: Family History  Problem Relation Age of Onset   Heart disease Father     ALLERGIES:  is allergic to other.  MEDICATIONS:  Current Outpatient Medications  Medication Sig Dispense Refill   traMADol (ULTRAM) 50 MG tablet Take 1 tablet (50 mg total) by mouth every 8 (eight) hours as needed. 30 tablet 0   albuterol (PROVENTIL HFA;VENTOLIN HFA) 108 (90 BASE) MCG/ACT inhaler Inhale 2 puffs into  the lungs every 6 (six) hours as needed for wheezing or shortness of breath. (Patient not taking: Reported on 06/11/2021)     aspirin 81 MG tablet Take 81 mg by mouth daily.     valsartan-hydrochlorothiazide (DIOVAN-HCT) 160-12.5 MG tablet      No current facility-administered medications for this visit.    REVIEW OF SYSTEMS:   Constitutional: ( - ) fevers, ( - )  chills , ( - ) night sweats Eyes: ( - ) blurriness of vision, ( - ) double vision, ( - ) watery eyes Ears, nose, mouth, throat, and face: ( - ) mucositis, ( - ) sore throat Respiratory: ( - ) cough, ( - ) dyspnea, ( - ) wheezes Cardiovascular: ( - ) palpitation, ( - ) chest discomfort, ( - ) lower extremity swelling Gastrointestinal:  ( - ) nausea, ( - ) heartburn, ( - ) change in bowel habits Skin: ( - ) abnormal skin rashes Lymphatics: ( - ) new lymphadenopathy, ( - ) easy bruising Neurological: ( - ) numbness, ( - ) tingling, ( - ) new weaknesses Behavioral/Psych: ( - ) mood change, ( - ) new changes  All other systems were reviewed with the patient and are negative.  PHYSICAL EXAMINATION: ECOG PERFORMANCE STATUS: 1 - Symptomatic but completely ambulatory  Vitals:   06/11/21 0907  BP: 137/78  Pulse: 80  Resp: 17  Temp: 98.1 F (  36.7 C)  SpO2: 92%   Filed Weights   06/11/21 0907  Weight: 160 lb 11.2 oz (72.9 kg)    GENERAL: well appearing middle-aged African-American female in NAD  SKIN: skin color, texture, turgor are normal, no rashes or significant lesions EYES: conjunctiva are pink and non-injected, sclera clear LUNGS: clear to auscultation and percussion with normal breathing effort HEART: regular rate & rhythm and no murmurs and no lower extremity edema Musculoskeletal: no cyanosis of digits and no clubbing  PSYCH: alert & oriented x 3, fluent speech NEURO: no focal motor/sensory deficits  LABORATORY DATA:  I have reviewed the data as listed CBC Latest Ref Rng & Units 06/11/2021 11/19/2018 09/11/2013  WBC  4.0 - 10.5 K/uL 8.6 6.9 3.8(L)  Hemoglobin 12.0 - 15.0 g/dL 14.2 16.3(H) 15.1(H)  Hematocrit 36.0 - 46.0 % 42.1 48.2(H) 44.7  Platelets 150 - 400 K/uL 334 263 256    CMP Latest Ref Rng & Units 06/11/2021 02/22/2019 11/19/2018  Glucose 70 - 99 mg/dL 107(H) 120(H) 100(H)  BUN 8 - 23 mg/dL 13 15 11   Creatinine 0.44 - 1.00 mg/dL 0.74 0.72 0.86  Sodium 135 - 145 mmol/L 140 141 140  Potassium 3.5 - 5.1 mmol/L 3.8 3.9 4.3  Chloride 98 - 111 mmol/L 103 106 105  CO2 22 - 32 mmol/L 27 25 25   Calcium 8.9 - 10.3 mg/dL 10.1 9.3 9.6  Total Protein 6.5 - 8.1 g/dL 7.8 - 7.9  Total Bilirubin 0.3 - 1.2 mg/dL 0.4 - 0.5  Alkaline Phos 38 - 126 U/L 149(H) - 119  AST 15 - 41 U/L 17 - 15  ALT 0 - 44 U/L 16 - 14     PATHOLOGY:    RADIOGRAPHIC STUDIES: I have personally reviewed the radiological images as listed and agreed with the findings in the report. NM PET Image Initial (PI) Skull Base To Thigh (F-18 FDG)  Result Date: 06/07/2021 CLINICAL DATA:  Additional treatment strategy for colorectal mass. EXAM: NUCLEAR MEDICINE PET SKULL BASE TO THIGH TECHNIQUE: 8.5 mCi F-18 FDG was injected intravenously. Full-ring PET imaging was performed from the skull base to thigh after the radiotracer. CT data was obtained and used for attenuation correction and anatomic localization. Fasting blood glucose: 113 mg/dl COMPARISON:  CT and pelvis dated May 11, 2021; CTA chest dated September 10, 2013 FINDINGS: Mediastinal blood pool activity: SUV max 2.9 Liver activity: SUV max 4.2 NECK: No hypermetabolic lymph nodes in the neck. Incidental CT findings: none CHEST: No hypermetabolic mediastinal or hilar nodes. No suspicious pulmonary nodules on the CT scan. Incidental CT findings: Calcifications the LAD and aorta solid pulmonary nodule of the right lung apex measuring 4 mm on series 4 image 50. solid left lobe measuring 5 mm on image 70. Left upper lobe measuring 4 mm on image 62. Bilateral air trapping. ABDOMEN/PELVIS:  Ill-defined mass adjacent to the rectum with an SUV max of 20, unchanged size compared to prior exam. Ill-defined nodule is seen in the left perirectal fat more inferiorly on image 171 measuring 1.4 x 1.7 cm and SUV 14.3, likely enlarged perirectal lymph node. No abnormal hypermetabolic activity within the liver, pancreas, adrenal glands, or spleen. Incidental CT findings: Diverticula of the sigmoid colon. Atherosclerotic disease of the abdominal aorta. SKELETON: No focal hypermetabolic activity to suggest skeletal metastasis. Incidental CT findings: none IMPRESSION: Hypermetabolic mass adjacent to the rectum, likely due to colorectal primary neoplasm. Ill-defined hypermetabolic nodule is seen more inferiorly in the left perirectal fat, likely a lymph node  metastasis. Numerous small bilateral small solid pulmonary nodules which are below resolution for PET-CT FDG avidity and new compared to 2015 chest CT. Recommend attention on follow-up. Electronically Signed   By: Yetta Glassman M.D.   On: 06/07/2021 08:57    ASSESSMENT & PLAN Blondine Hottel 65 y.o. female with medical history significant for hypertension who presents for evaluation of a newly diagnosed with differentiated high-grade carcinoma in the pelvis.  After review of the labs, review of the records, and discussion with the patient the patients findings are most consistent with a pelvic mass concerning for GYN malignancy.  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 Johney Maine next week.  MRI scheduled for late next week.  Agree with getting GYN surgical oncology involved as well --Patient's case to be discussed at [facility] --We will order tumor markers for starters to include CEA, CA 19-9, CA125 --If this is found to be a GYN malignancy we will transfer care to Dr. Heath Lark.  She  specializes in GYN malignancies and the patient would be best served in her care --Return to clinic following the results of the [facility] and further work-up  Orders Placed This Encounter  Procedures   CBC with Differential ([facility] Only)    Standing Status:   Future    Number of Occurrences:   1    Standing Expiration Date:   06/11/2022   CMP ([facility] only)    Standing Status:   Future    Number of Occurrences:   1    Standing Expiration Date:   06/11/2022   Lactate dehydrogenase (LDH)    Standing Status:   Future    Number of Occurrences:   1    Standing Expiration Date:   06/11/2022   CA 125    Standing Status:   Future    Number of Occurrences:   1    Standing Expiration Date:   06/11/2022   CEA (IN HOUSE-[facility])FOR [facility] ONLY    Standing Status:   Future    Number of Occurrences:   1    Standing Expiration Date:   06/11/2022   CA 19.9    Standing Status:   Future    Number of Occurrences:   1    Standing Expiration Date:   06/11/2022    All questions were answered. The patient knows to call the clinic with any problems, questions or concerns.  A total of more than 60 minutes were spent on this encounter with face-to-face time and non-face-to-face time, including preparing to see the patient, ordering tests and/or medications, counseling the patient and coordination of care as outlined above.   Ledell Peoples, MD Department of Hematology/Oncology [facility] Phone: [phone] Pager: [phone] Email: [email or web address]  06/15/2021 12:42 PM

## 2021-06-12 LAB — CA 125: Cancer Antigen (CA) 125: 9.1 U/mL (ref 0.0–38.1)

## 2021-06-12 LAB — CANCER ANTIGEN 19-9: CA 19-9: <2 U/mL (ref 0–35)

## 2021-06-14 ENCOUNTER — Other Ambulatory Visit: Payer: Self-pay | Admitting: Oncology

## 2021-06-14 ENCOUNTER — Telehealth: Payer: Self-pay | Admitting: Oncology

## 2021-06-14 ENCOUNTER — Encounter: Payer: Self-pay | Admitting: Oncology

## 2021-06-14 DIAGNOSIS — R102 Pelvic and perineal pain: Secondary | ICD-10-CM | POA: Diagnosis not present

## 2021-06-14 DIAGNOSIS — R1900 Intra-abdominal and pelvic swelling, mass and lump, unspecified site: Secondary | ICD-10-CM | POA: Diagnosis not present

## 2021-06-14 NOTE — Telephone Encounter (Signed)
Called [facility] and requested for slides to be sent to [facility].

## 2021-06-14 NOTE — Progress Notes (Signed)
Gynecologic Oncology Multi-Disciplinary Disposition Conference Note  Date of the Conference: 06/14/2021  Patient Name: Mariah Schultz  Referring Provider: Dr. Lorenso Courier   Stage/Disposition:  Poorly differentiated high grade carcinoma with consideration for a tuboovarian or peritoneal primary. Disposition is to primary surgery.   This Multidisciplinary conference took place involving physicians from [facility], [place], Radiation Oncology, Pathology, Radiology along with the Gynecologic Oncology Nurse Practitioner and RN.  Comprehensive assessment of the patient's malignancy, staging, need for surgery, chemotherapy, radiation therapy, and need for further testing were reviewed. Supportive measures, both inpatient and following discharge were also discussed. The recommended plan of care is documented. Greater than 35 minutes were spent correlating and coordinating this patient's care.

## 2021-06-14 NOTE — Progress Notes (Signed)
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 female who is accompanied by her husband. she is seen as a courtesy of Dr. Johney Maine for an opinion concerning radiation therapy as part of management for her recently diagnosed pelvic cancer. The patient presented to Dr. Delfina Redwood this past September with a 1 month history of unusual bowel habits described as small frequent stools and frequent bowel movements at night time. The patient also reported mucus in her stool, one episode of bloody stool, and lower abdominal pressure /distention. She was initially seen by her PCP in August of 2022; blood work performed at the time showed normal findings. CT of the abdomen and pelvis without contrast performed on 05/11/21 demonstrated a 7.5 x 6.9 enhancing mass located at the posterior pelvis concerning for colorectal malignancy as well as possible metastatic adenopathy  Accordingly, the patient was referred to Dr. Alessandra Bevels at [facility] on 05/20/21 for further evaluation. Recto-sigmoid biopsy performed on 05/24/21 by Dr. Alessandra Bevels revealed poorly differentiated high grade carcinoma.  The flexible sigmoidoscopy was poor due to poor bowel prep.  There was a large amount of solid stool in the rectum, rectosigmoid colon and distal sigmoid colon which interfered with visualization.  The scope could not be advanced beyond 20 to 25 cm.  A biopsy however was obtained from this procedure.  PET performed on 06/04/21 demonstrated the hypermetabolic mass adjacent to the rectum; noted as  likely due to colorectal primary neoplasm. An ill-defined hypermetabolic nodule was also seen more inferiorly in the left perirectal fat, noted as a likely lymph node metastasis. In addition, numerous new small bilateral solid pulmonary nodules were seen though were below resolution for PET/CT avidity (new compared to chest CT in 2015).  The patient was then referred to Dr. Lorenso Courier on 06/11/21 for further evaluation. The patient endorsed continued abdominal pain during this visit and some difficulty voiding. She reported continued thin stools and some difficulty sleeping as well.  Tumor markers were obtained and there was no elevation of CA125 , CEA or CA 19.9.  The patients case was discussed during  the GYn/OncTumor Board held on 06/14/21.  Pathologic findings were discussed and they were not consistent with a colorectal primary possibly cervical cancer.  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 Johney Maine yesterday with consultation notes pending at this time.  Preliminary interpretation was the patient may need preoperative radiation therapy prior to surgical resection of this mass.  Gynecologic history is significant for the patient having 3 spontaneous vaginal deliveries.   She reports undergoing "partial hysterectomy".  Her interpretation was the cervix and ovaries remained.  This hysterectomy was performed for abnormal uterine bleeding presumably fibroids.  She reports having some Pap smears after her hysterectomy and her interpretation was they were normal.   PREVIOUS RADIATION THERAPY: No  PAST MEDICAL HISTORY:  Past Medical History:  Diagnosis Date   Hypertension    Papilloma of left breast    Papilloma of left breast 02/26/2019    PAST SURGICAL HISTORY: Past Surgical History:  Procedure Laterality Date   ABDOMINAL HYSTERECTOMY  APPENDECTOMY     BREAST EXCISIONAL BIOPSY Left 02/26/2019   B9 Biopsy   BREAST  LUMPECTOMY WITH RADIOACTIVE SEED LOCALIZATION Left 02/26/2019   Procedure: LEFT BREAST LUMPECTOMY WITH RADIOACTIVE SEED LOCALIZATION;  Surgeon: Fanny Skates, MD;  Location: [place];  Service: General;  Laterality: Left;   LAPAROSCOPIC APPENDECTOMY N/A 02/25/2013   Procedure: APPENDECTOMY LAPAROSCOPIC;  Surgeon: Harl Bowie, MD;  Location: [place] [facility];  Service: General;  Laterality: N/A;    FAMILY HISTORY:  Family History  Problem Relation Age of Onset   Heart disease Father     SOCIAL HISTORY:  Social History   Tobacco Use   Smoking status: Every Day    Packs/day: 1.00    Types: Cigarettes   Smokeless tobacco: Never  Substance Use Topics   Alcohol use: Yes    Comment: occ   Drug use: No    ALLERGIES:  Allergies  Allergen Reactions   Other Swelling    pneumonia vaccine    MEDICATIONS:  Current Outpatient Medications  Medication Sig Dispense Refill   albuterol (PROVENTIL HFA;VENTOLIN HFA) 108 (90 BASE) MCG/ACT inhaler Inhale 2 puffs into the lungs every 6 (six) hours as needed for wheezing or shortness of breath.     aspirin 81 MG tablet Take 81 mg by mouth daily.     Multiple Vitamin (MULTIVITAMIN) tablet Take 1 tablet by mouth daily.     Omega-3 Fatty Acids (FISH OIL) 1000 MG CAPS Take by mouth.     traMADol (ULTRAM) 50 MG tablet Take 1 tablet (50 mg total) by mouth every 8 (eight) hours as needed. 30 tablet 0   valsartan-hydrochlorothiazide (DIOVAN-HCT) 160-12.5 MG tablet      vitamin C (ASCORBIC ACID) 500 MG tablet Take 500 mg by mouth daily.     No current facility-administered medications for this encounter.    REVIEW OF SYSTEMS:  A 10+ POINT REVIEW OF SYSTEMS WAS OBTAINED including neurology, dermatology, psychiatry, cardiac, respiratory, lymph, extremities, GI, GU, musculoskeletal, constitutional, reproductive, HEENT.  She denies any vaginal bleeding or discharge.  She has significant obstipation.  She reports significant bowel urgency but is  unable to have significant bowel movements.  She has awoken any times during the evening with the symptoms.   PHYSICAL EXAM:  height is 5' 4.5" (1.638 m) and weight is 169 lb 12.8 oz (77 kg). Her temporal temperature is 97.6 F (36.4 C). Her blood pressure is 126/68 and her pulse is 76. Her respiration is 18 and oxygen saturation is 100%.   General: Alert and oriented, in no acute distress HEENT: Head is normocephalic. Extraocular movements are intact. Oropharynx is clear. Neck: Neck is supple, no palpable cervical or supraclavicular lymphadenopathy. Heart: Regular in rate and rhythm with no murmurs, rubs, or gallops. Chest: Clear to auscultation bilaterally, with no rhonchi, wheezes, or rales. Abdomen: Soft, nontender, nondistended, with no rigidity or guarding. Extremities: No cyanosis or edema. Lymphatics: see Neck Exam Skin: No concerning lesions. Musculoskeletal: symmetric strength and muscle tone throughout. Neurologic: Cranial nerves II through XII are grossly intact. No obvious focalities. Speech is fluent. Coordination is intact. Psychiatric: Judgment and insight are intact. Affect is appropriate. A pelvic exam was performed.  The external genitalia are unremarkable.  A speculum exam was performed using a small speculum.  There were no mucosal lesions noted in the vaginal vault.  The cervical os area was quite erythematous.  On bimanual examination there was a large mass behind the cervical os which was quite firm consistent  with malignancy.  Conceivable that this mass originated in the residual cervix.  Mass appeared to be at least 3 m in size but exam was very uncomfortable for the patient.  She would not permit rectal exam in light of her rectal exam yesterday by Dr. Johney Maine.  ECOG = 1  0 - Asymptomatic (Fully active, able to carry on all predisease activities without restriction)  1 - Symptomatic but completely ambulatory (Restricted in physically strenuous activity but ambulatory  and able to carry out work of a light or sedentary nature. For example, light housework, office work)  2 - Symptomatic, <50% in bed during the day (Ambulatory and capable of all self care but unable to carry out any work activities. Up and about more than 50% of waking hours)  3 - Symptomatic, >50% in bed, but not bedbound (Capable of only limited self-care, confined to bed or chair 50% or more of waking hours)  4 - Bedbound (Completely disabled. Cannot carry on any self-care. Totally confined to bed or chair)  5 - Death   Eustace Pen MM, Creech RH, Tormey DC, et al. [phone]). "Toxicity and response criteria of the [facility]". [place] Oncol. 5 (6): 649-55  LABORATORY DATA:  Lab Results  Component Value Date   WBC 8.6 06/11/2021   HGB 14.2 06/11/2021   HCT 42.1 06/11/2021   MCV 90.7 06/11/2021   PLT 334 06/11/2021   NEUTROABS 5.7 06/11/2021   Lab Results  Component Value Date   NA 140 06/11/2021   K 3.8 06/11/2021   CL 103 06/11/2021   CO2 27 06/11/2021   GLUCOSE 107 (H) 06/11/2021   CREATININE 0.74 06/11/2021   CALCIUM 10.1 06/11/2021      RADIOGRAPHY: NM PET Image Initial (PI) Skull Base To Thigh (F-18 FDG)  Result Date: 06/07/2021 CLINICAL DATA:  Additional treatment strategy for colorectal mass. EXAM: NUCLEAR MEDICINE PET SKULL BASE TO THIGH TECHNIQUE: 8.5 mCi F-18 FDG was injected intravenously. Full-ring PET imaging was performed from the skull base to thigh after the radiotracer. CT data was obtained and used for attenuation correction and anatomic localization. Fasting blood glucose: 113 mg/dl COMPARISON:  CT and pelvis dated May 11, 2021; CTA chest dated September 10, 2013 FINDINGS: Mediastinal blood pool activity: SUV max 2.9 Liver activity: SUV max 4.2 NECK: No hypermetabolic lymph nodes in the neck. Incidental CT findings: none CHEST: No hypermetabolic mediastinal or hilar nodes. No suspicious pulmonary nodules on the CT scan. Incidental CT  findings: Calcifications the LAD and aorta solid pulmonary nodule of the right lung apex measuring 4 mm on series 4 image 50. solid left lobe measuring 5 mm on image 70. Left upper lobe measuring 4 mm on image 62. Bilateral air trapping. ABDOMEN/PELVIS: Ill-defined mass adjacent to the rectum with an SUV max of 20, unchanged size compared to prior exam. Ill-defined nodule is seen in the left perirectal fat more inferiorly on image 171 measuring 1.4 x 1.7 cm and SUV 14.3, likely enlarged perirectal lymph node. No abnormal hypermetabolic activity within the liver, pancreas, adrenal glands, or spleen. Incidental CT findings: Diverticula of the sigmoid colon. Atherosclerotic disease of the abdominal aorta. SKELETON: No focal hypermetabolic activity to suggest skeletal metastasis. Incidental CT findings: none IMPRESSION: Hypermetabolic mass adjacent to the rectum, likely due to colorectal primary neoplasm. Ill-defined hypermetabolic nodule is seen more inferiorly in the left perirectal fat, likely a lymph node metastasis. Numerous small bilateral small solid pulmonary nodules which are below resolution for PET-CT FDG  avidity and new compared to 2015 chest CT. Recommend attention on follow-up. Electronically Signed   By: Yetta Glassman M.D.   On: 06/07/2021 08:57      IMPRESSION:  poorly differentiated high-grade carcinoma presenting in the pelvis.  Exam today is suspicious for cervical origin.  Outside pathology is undergoing it additional review to determine if this could potentially be gynecologic origin.  I discussed the recent imaging studies in detail as well as biopsy results with patient and her husband.  She is having significant bowel symptoms likely resulting from extrinsic compression of the rectosigmoid area from this large pelvic mass.  Patient would benefit from radiation therapy for tumor shrinkage.  If this turns out to be a cervical primary then would recommend a combination of external beam  radiation therapy along with radiosensitizing chemotherapy and consideration for brachytherapy.   We discussed the available radiation techniques, and focused on the details of logistics and delivery.  We reviewed the anticipated acute and late sequelae associated with radiation in this setting.  The patient was encouraged to ask questions that I answered to the best of my ability.  A patient consent form was discussed and signed.  We retained a copy for our records.  The patient would like to proceed with radiation and will be scheduled for CT simulation.  PLAN: To expedite treatment the patient will be simulated today.  Treatments to begin next week.  Final interpretation of pathology is pending at this time.  We will consider gynecologic oncology referral for detailed pelvic examination and input concerning the etiology of this mass with potential additional biopsies.   60 minutes of total time was spent for this patient encounter, including preparation, face-to-face counseling with the patient and coordination of care, physical exam, and documentation of the encounter.   ------------------------------------------------  Blair Promise, PhD, MD  This document serves as a record of services personally performed by Gery Pray, MD. It was created on his behalf by Roney Mans, a trained medical scribe. The creation of this record is based on the scribe's personal observations and the provider's statements to them. This document has been checked and approved by the attending provider.

## 2021-06-15 ENCOUNTER — Ambulatory Visit
Admission: RE | Admit: 2021-06-15 | Discharge: 2021-06-15 | Disposition: A | Discharge status: Home / Self Care | Payer: Medicare HMO | Source: Ambulatory Visit | Attending: Radiation Oncology | Admitting: Radiation Oncology

## 2021-06-15 ENCOUNTER — Other Ambulatory Visit: Payer: Self-pay

## 2021-06-15 ENCOUNTER — Encounter: Payer: Self-pay | Admitting: Radiation Oncology

## 2021-06-15 ENCOUNTER — Ambulatory Visit: Admission: RE | Admit: 2021-06-15 | Payer: Medicare HMO | Source: Ambulatory Visit | Admitting: Radiation Oncology

## 2021-06-15 ENCOUNTER — Ambulatory Visit: Payer: Medicare HMO

## 2021-06-15 VITALS — BP 126/68 | HR 76 | Temp 97.6°F | Resp 18 | Ht 64.5 in | Wt 169.8 lb

## 2021-06-15 DIAGNOSIS — C539 Malignant neoplasm of cervix uteri, unspecified: Secondary | ICD-10-CM | POA: Diagnosis not present

## 2021-06-15 DIAGNOSIS — Z79899 Other long term (current) drug therapy: Secondary | ICD-10-CM | POA: Diagnosis not present

## 2021-06-15 DIAGNOSIS — R918 Other nonspecific abnormal finding of lung field: Secondary | ICD-10-CM | POA: Diagnosis not present

## 2021-06-15 DIAGNOSIS — Z7982 Long term (current) use of aspirin: Secondary | ICD-10-CM | POA: Insufficient documentation

## 2021-06-15 DIAGNOSIS — C19 Malignant neoplasm of rectosigmoid junction: Secondary | ICD-10-CM

## 2021-06-15 DIAGNOSIS — C763 Malignant neoplasm of pelvis: Secondary | ICD-10-CM | POA: Diagnosis not present

## 2021-06-15 DIAGNOSIS — R1909 Other intra-abdominal and pelvic swelling, mass and lump: Secondary | ICD-10-CM | POA: Insufficient documentation

## 2021-06-15 DIAGNOSIS — F17210 Nicotine dependence, cigarettes, uncomplicated: Secondary | ICD-10-CM | POA: Insufficient documentation

## 2021-06-15 DIAGNOSIS — R197 Diarrhea, unspecified: Secondary | ICD-10-CM | POA: Insufficient documentation

## 2021-06-15 DIAGNOSIS — R970 Elevated carcinoembryonic antigen [CEA]: Secondary | ICD-10-CM | POA: Insufficient documentation

## 2021-06-15 DIAGNOSIS — I10 Essential (primary) hypertension: Secondary | ICD-10-CM | POA: Insufficient documentation

## 2021-06-15 NOTE — Progress Notes (Signed)
Location of Tumor / Histology: posteriorly in the pelvis anterior to the sacrum    Mariah Schultz presented with symptoms of:  abdominal bloating/discomfort and small frequent night time BM.  Biopsies revealed:    Past/Anticipated interventions by Gyn/Onc surgery, if any: 05/24/2021: colonoscopy performed by [facility]. Biopsy showed poorly differentiated high grade carcinoma, differential including tuboovarian and peritoneal. Immunophemotype not consistent with colorectal adenocarcinoma.  Past/Anticipated interventions by medical oncology, if any: pending  Weight changes, if any: no  Bowel/Bladder complaints, if any: Yes.  ,   abdominal bloating/discomfort and small frequent night time BM.   Constipation. Saw blood in stool once, nocturia x 3-4  Nausea/Vomiting, if any: no  Pain issues, if any:  yes, rectal pain varying between 3-7/10  SAFETY ISSUES: Prior radiation? no Pacemaker/ICD? no Possible current pregnancy? no, hysterectomy Is the patient on methotrexate? no  Current Complaints / other details:  Constant feeling of needing to have a bowel movement. Reports constipation. Occasionally stool is liquid and clear. Reports stomach discomfort. Takes Miralax to keep stool soft in able to clear mass in rectum.  Vitals:   06/15/21 1238  BP: 126/68  Pulse: 76  Resp: 18  Temp: 97.6 F (36.4 C)  TempSrc: Temporal  SpO2: 100%  Weight: 169 lb 12.8 oz (77 kg)  Height: 5' 4.5" (1.638 m)

## 2021-06-15 NOTE — Progress Notes (Signed)
See MD note for nursing evaluation. °

## 2021-06-16 ENCOUNTER — Telehealth: Payer: Self-pay | Admitting: *Deleted

## 2021-06-16 ENCOUNTER — Encounter: Payer: Self-pay | Admitting: Gynecologic Oncology

## 2021-06-16 ENCOUNTER — Encounter: Payer: Self-pay | Admitting: *Deleted

## 2021-06-16 NOTE — Progress Notes (Signed)
[facility] CSW Distress Screen  Intern contacted patient by phone per distress screen protocol. Patient scored a 6 indicating moderate distress.    ONCBCN DISTRESS SCREENING 06/15/2021  Screening Type Initial Screening  Distress experienced in past week (1-10) 6  Emotional problem type Nervousness/Anxiety;Adjusting to illness  Physical Problem type Pain;Sleep/insomnia;Loss of appetitie;Constipation/diarrhea   Intern discussed feelings and reactions related to diagnosis, the importance of reaching out for support, and shared information about the Support Services team and services offered. Pt has no logistical needs at this time and Intern provided contact information for patient to reach out with any future needs.  Rosary Lively, BSW Intern Supervised by Gwinda Maine, LCSW

## 2021-06-16 NOTE — Telephone Encounter (Signed)
Spoke with the patient and scheduled a new patient appt on 10/14 at 9 am with Dr Berline Lopes. Patient given an arrival time of 8:30 am; along with the policy for mask and visitors. Patient given the address and phone number for the clinic

## 2021-06-17 ENCOUNTER — Other Ambulatory Visit: Payer: Self-pay | Admitting: Surgery

## 2021-06-17 ENCOUNTER — Ambulatory Visit
Admission: RE | Admit: 2021-06-17 | Discharge: 2021-06-17 | Disposition: A | Discharge status: Home / Self Care | Payer: Medicare HMO | Source: Ambulatory Visit | Attending: Surgery | Admitting: Surgery

## 2021-06-17 DIAGNOSIS — C20 Malignant neoplasm of rectum: Secondary | ICD-10-CM

## 2021-06-17 DIAGNOSIS — R590 Localized enlarged lymph nodes: Secondary | ICD-10-CM | POA: Diagnosis not present

## 2021-06-17 DIAGNOSIS — D492 Neoplasm of unspecified behavior of bone, soft tissue, and skin: Secondary | ICD-10-CM | POA: Diagnosis not present

## 2021-06-17 DIAGNOSIS — K5730 Diverticulosis of large intestine without perforation or abscess without bleeding: Secondary | ICD-10-CM | POA: Diagnosis not present

## 2021-06-17 DIAGNOSIS — C218 Malignant neoplasm of overlapping sites of rectum, anus and anal canal: Secondary | ICD-10-CM | POA: Diagnosis not present

## 2021-06-17 NOTE — Progress Notes (Signed)
GYNECOLOGIC ONCOLOGY NEW PATIENT CONSULTATION   Patient Name: Mariah Schultz  Patient Age: 65 y.o. Date of Service: 06/18/21 Referring Provider: Dr. Michael Boston  Primary Care Provider: Seward Carol, MD Consulting Provider: Jeral Pinch, MD   Assessment/Plan:  Postmenopausal patient with history of supracervical hysterectomy years ago now with rectal mass and biopsies concerning for locally advanced gyn malignancy.  I reviewed with the patient and her husband, who accompanied her today, work up this far including imaging, G.I. evaluation of her rectum and sigmoid, tumor markers, and biopsy of intraluminal rectal mass. 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 Initially this was reported as possibly being peritoneal or tubo-ovarian in origin, more recent conversation with pathologist indicates that it could also be cervical.   Patient had an MRI of the pelvis yesterday. We looked at the images together. While I am not able to see a definitive plan between the cervix and this mass within the posterior cul-de-sac involving the rectum, the radiologist notes that mass is in close proximity to the cervix but does not appear to communicate directly with it.  External cervix was normal an exam today. Biopsies and Pap smears performed, but I assume that these will come back normal. I cannot distinguish the mass as separate from the cervix on rectovaginal exam.  We discussed that from a gynecologic cancer standpoint, the treatment if this is primary peritoneal or tubal carcinoma would be very different than if it was cervical. We discussed that the mainstay for locally advanced cervix cancer is chemoradiation whereas we would use the neoadjuvant chemotherapy approach if primary surgical debulking was not feasible in the setting of ovarian, fallopian tube, and primary  peritoneal cancer. Given distribution of disease on imaging, with no peritoneal spread, I feel that primary peritoneal or tubal malignancy seems unlikely.   I do feel that more information is necessary prior to proceeding with making a definitive treatment plan. I spoke with Dr. Johney Maine today and recommended to the patient that we move forward with scheduling exploratory surgery, bowel diversion, possible biopsies, and possible tumor to bulking, although I think the chance of tumor to bulking at the time of upcoming surgery would be very unlikely. My hope is that by intraperitoneal examination, either by a minimally invasive manner or via laparotomy, we may be able to better distinguish the origin of this mass and obtain additional biopsies for pathologic diagnosis. Given size of the mass in her rectum, I fear that she is an impending bowel obstruction. Regardless of the organ of origin, I think she would benefit from stool diversion.   Will plan to have the patient return on Monday for a preoperative visit. Will coordinate surgery including bowel prep with Dr. Johney Maine' office.  A copy of this note was sent to the patient's referring provider.   80 minutes of total time was spent for this patient encounter, including preparation, face-to-face counseling with the patient and coordination of care, and documentation of the encounter.   Jeral Pinch, MD  Division of Gynecologic Oncology  Department of Obstetrics and Gynecology  [facility]  ___________________________________________  Chief Complaint: No chief complaint on file.   History of Present Illness:  Mariah Schultz is a 65 y.o. y.o. female who is seen in consultation at the request of Dr. Johney Maine for an evaluation of deep pelvic mass with rectal invasion and biopsy favoring  GYN origin.  Patient notes about a year history of change in bowel function.  She describes this as being constipated although having a  bowel movement every day without needing to strain, but describes her stool as hard and very small quantity.  She ultimately saw her PCP, Dr. Delfina Redwood, in September secondary to reports of a month of worsening change to her bowel function.  Today, she describes this as having softer but even smaller bowel movements starting in early August, often no bigger than the size of a cherry.  She then had severe diarrhea for a day and then began having elongated but very narrow stools with increased frequency (often going 3-5 times a night).  She then began having some discharge and bloody drainage from her rectum.  Given symptoms, CT of the abdomen and pelvis without contrast performed on 05/11/21 demonstrated a 7.5 x 6.9 enhancing mass located at the posterior pelvis concerning for colorectal malignancy as well as possible metastatic adenopathy.  Patient was subsequently referred to Dr. Alessandra Bevels at [facility] on 05/20/21 for further evaluation.  The flexible sigmoidoscopy was poor due to poor bowel prep.  There was a large amount of solid stool in the rectum, rectosigmoid colon and distal sigmoid colon which interfered with visualization.  The scope could not be advanced beyond 20 to 25 cm.  Recto-sigmoid biopsy performed on 05/24/21 by Dr. Alessandra Bevels revealed poorly differentiated high grade carcinoma.   A PET scan was performed at the end of September showing a hypermetabolic mass adjacent to the rectum with an ill-defined hypermetabolic nodule more inferiorly on the left within the perirectal fat thought to be a lymph node.  New small bilateral solid pulmonary nodules were also seen below the resolution for PET.  Patient saw Dr. Lorenso Courier (medical oncology) a week ago.  Tumor markers were obtained including CA-125, CEA, and CA 19.9 and were all normal.   She then saw Dr. Johney Maine (surgery) as well as Dr. Sondra Come (radiation oncology) and has undergone simulation for possible radiation.    Imaging work-up  thus far: 05/12/2021: CT abdomen performed due to abdominal bloating/discomfort and small frequent night time BM. Findings showed a 7.5 x 6.9 cm heterogeneously enhancing mass is noted posteriorly in the pelvis anterior to the sacrum which is highly concerning for colorectal malignancy with possible adjacent metastatic adenopathy. 05/24/2021: colonoscopy performed by [facility]. Biopsy showed poorly differentiated high grade carcinoma, differential including tuboovarian and peritoneal. Immunophemotype not consistent with colorectal adenocarcinoma.  06/04/2021: PET CT scan performed, findings show hypermetabolic mass adjacent to the rectum, likely due to colorectal primary neoplasm and ill-defined hypermetabolic nodule is seen more inferiorly in the left perirectal fat, likely a lymph node metastasis. She was also noted to have Numerous small bilateral small solid pulmonary nodules which are below resolution for PET-CT. 06/17/21: MRI of the pelvis reveals an 8.7 x 6.3 x 7.2 cm mass in the upper and mid rectum.  The tumor is noted to abut the posterior wall of the cervix but does not appear to arise from or definitively invade into it.  The bulk of the tumor is centrally located in the midline pelvis involving the rectum and cul-de-sac.  Patient denies any vaginal bleeding or discharge.  She had a hysterectomy in 2003 or 2004 for fibroids, heavy bleeding and pain.  She denies any postmenopausal bleeding.  Per chart review, she had a negative Pap smear in January 2018, was HPV negative at that time.  She notes appetite has been up and  down.  Overall, she eats less and thinks she has lost 4 to 5 pounds in the last year.  She notes that her abdomen is distended and endorses early satiety as well as bloating.  She denies any nausea or emesis.  She denies urinary symptoms.  Patient lives in [place] with her husband and their dog.  She endorses tobacco use with 1 pack/day and has smoked since her early teens.  She  drinks 1/2 to 1 glass of red wine most days.  PAST MEDICAL HISTORY:  Past Medical History:  Diagnosis Date   Hypertension    Papilloma of left breast    Papilloma of left breast 02/26/2019     PAST SURGICAL HISTORY:  Past Surgical History:  Procedure Laterality Date   ABDOMINAL HYSTERECTOMY     APPENDECTOMY     BREAST EXCISIONAL BIOPSY Left 02/26/2019   B9 Biopsy   BREAST LUMPECTOMY WITH RADIOACTIVE SEED LOCALIZATION Left 02/26/2019   Procedure: LEFT BREAST LUMPECTOMY WITH RADIOACTIVE SEED LOCALIZATION;  Surgeon: Fanny Skates, MD;  Location: [place];  Service: General;  Laterality: Left;   LAPAROSCOPIC APPENDECTOMY N/A 02/25/2013   Procedure: APPENDECTOMY LAPAROSCOPIC;  Surgeon: Harl Bowie, MD;  Location: [place];  Service: General;  Laterality: N/A;    OB/GYN HISTORY:  OB History  Gravida Para Term Preterm AB Living  3 3          SAB IAB Ectopic Multiple Live Births               # Outcome Date GA Lbr Len/2nd Weight Sex Delivery Anes PTL Lv  3 Para           2 Para           1 Para             No LMP recorded. Patient has had a hysterectomy.  Age at menarche: 56 Age at menopause: 39 Hx of HRT: Denies Hx of STDs: Denies Last pap: 2018 History of abnormal pap smears: Denies  SCREENING STUDIES:  Last mammogram: 20/2022  Last colonoscopy: see HPI  MEDICATIONS: Outpatient Encounter Medications as of 06/18/2021  Medication Sig   albuterol (PROVENTIL HFA;VENTOLIN HFA) 108 (90 BASE) MCG/ACT inhaler Inhale 2 puffs into the lungs every 6 (six) hours as needed for wheezing or shortness of breath.   aspirin 81 MG tablet Take 81 mg by mouth daily.   Multiple Vitamin (MULTIVITAMIN) tablet Take 1 tablet by mouth daily.   Omega-3 Fatty Acids (FISH OIL PO) Take 1 capsule by mouth daily.   traMADol (ULTRAM) 50 MG tablet Take 1 tablet (50 mg total) by mouth every 8 (eight) hours as needed. (Patient taking differently: Take 50 mg by mouth every 8 (eight)  hours as needed (pain).)   valsartan-hydrochlorothiazide (DIOVAN-HCT) 160-12.5 MG tablet Take 1 tablet by mouth daily.   vitamin C (ASCORBIC ACID) 500 MG tablet Take 500 mg by mouth daily.   No facility-administered encounter medications on file as of 06/18/2021.    ALLERGIES:  Allergies  Allergen Reactions   Other Swelling    pneumonia vaccine     FAMILY HISTORY:  Family History  Problem Relation Age of Onset   Heart disease Father    Colon cancer Neg Hx    Breast cancer Neg Hx    Ovarian cancer Neg Hx    Pancreatic cancer Neg Hx    Prostate cancer Neg Hx    Endometrial cancer Neg Hx  Patient unaware     SOCIAL HISTORY:  Social Connections: Not on file    REVIEW OF SYSTEMS:  Pertinent positives as per HPI.  Patient also endorses fatigue Denies fevers, chills, unexplained weight changes. Denies hearing loss, neck lumps or masses, mouth sores, ringing in ears or voice changes. Denies cough or wheezing.  Denies shortness of breath. Denies chest pain or palpitations. Denies leg swelling. Denies abdominal distention, pain, blood in stools, diarrhea, nausea, vomiting, or early satiety. Denies pain with intercourse, dysuria, frequency, hematuria or incontinence. Denies hot flashes, pelvic pain, vaginal bleeding or vaginal discharge.   Denies joint pain, back pain or muscle pain/cramps. Denies itching, rash, or wounds. Denies dizziness, headaches, numbness or seizures. Denies swollen lymph nodes or glands, denies easy bruising or bleeding. Denies anxiety, depression, confusion, or decreased concentration.  Physical Exam:  Vital Signs for this encounter:  Blood pressure 136/84, pulse 77, temperature 98.4 F (36.9 C), temperature source Oral, resp. rate 16, height 5\' 4"  (1.626 m), weight 170 lb (77.1 kg), SpO2 98 %. Body mass index is 29.18 kg/m. General: Alert, oriented, no acute distress.  HEENT: Normocephalic, atraumatic. Sclera anicteric.  Chest: Clear to  auscultation bilaterally. No wheezes, rhonchi, or rales. Cardiovascular: Regular rate and rhythm, no murmurs, rubs, or gallops.  Abdomen: Normoactive bowel sounds. Soft, mildly distended, nontender to palpation. No masses or hepatosplenomegaly appreciated. No palpable fluid wave.  Extremities: Grossly normal range of motion. Warm, well perfused. No edema bilaterally.  Skin: No rashes or lesions.  Lymphatics: No cervical, supraclavicular, or inguinal adenopathy.  GU:  Normal external female genitalia. No lesions. No discharge or bleeding.             Bladder/urethra:  No lesions or masses, well supported bladder             Vagina: Mildly atrophic vaginal mucosa.  No bleeding or discharge.             Cervix: Normal appearing, no lesions.  Pap test collected.             Uterus: Surgically absent.             Bimanual and rectovaginal exam: On bimanual exam, the cervix itself is normal however there is nodularity involvement of the parametria.  On rectal exam, there is a mass that can be appreciated approximately 6 cm with in the rectum.  It is difficult to distinguish this mass anteriorly as being separate from the cervix.  LABORATORY AND RADIOLOGIC DATA:  Outside medical records were reviewed to synthesize the above history, along with the history and physical obtained during the visit.   Lab Results  Component Value Date   WBC 8.6 06/11/2021   HGB 14.2 06/11/2021   HCT 42.1 06/11/2021   PLT 334 06/11/2021   GLUCOSE 107 (H) 06/11/2021   ALT 16 06/11/2021   AST 17 06/11/2021   NA 140 06/11/2021   K 3.8 06/11/2021   CL 103 06/11/2021   CREATININE 0.74 06/11/2021   BUN 13 06/11/2021   CO2 27 06/11/2021   CA 19.9: <2 CEA: 3.19 CA-125: 9.1  MRI pelvis 10/13: ADDENDUM: Pathology results show poorly differentiated carcinoma, which is NOT that of colorectal adenocarcinoma, but suspected primary GYN or peritoneal origin. Cervical carcinoma is suspected clinically.   The patient has  undergone a supracervical hysterectomy, however the cervix is well seen and the tumor abuts its posterior wall, but does not appear to arise from, or definitely invade into, the cervix. This  bulky tumor is centered squarely in the midline posterior pelvis, involving the rectum and cul-de-sac, and could be of peritoneal or tubovarian origin. This was discussed with Dr. Johney Maine by telephone on 06/18/2021.  TUMOR DESCRIPTION   Circumferential Extent: 100%   Tumor Length: Bulky tumor in the upper and mid rectum, with length of 8.9 cm; overall tumor dimensions are 8.7 x 6.3 x 7.2 cm   T - CATEGORY   Extension through Muscularis Propria: Yes = T3   Shortest Distance of any tumor/node from Mesorectal Fascia: 0 mm, tumor directly abuts the mesorectal fascia posteriorly   Extramural Vascular Invasion/Tumor Thrombus: Yes (e.g. on Image 16/9)   Invasion of Anterior Peritoneal Reflection: No   Involvement of Adjacent Organs or Pelvic Sidewall: No   Levator Ani Involvement: No   N - CATEGORY   Regional Lymph Nodes >=81mm: Total of at least 6 (see series 8) =N2a. Largest lymph node in left posterior perirectal space measures 1.7 cm.   Other:  Sigmoid diverticulosis, without evidence of diverticulitis.   IMPRESSION: Rectal adenocarcinoma T stage: T3   Rectal adenocarcinoma N stage:  N2a   Distance from tumor to the internal anal sphincter is 4.7 cm.

## 2021-06-17 NOTE — H&P (View-Only) (Signed)
GYNECOLOGIC ONCOLOGY NEW PATIENT CONSULTATION   Patient Name: Mariah Schultz  Patient Age: 65 y.o. Date of Service: 06/18/21 Referring Provider: Dr. Michael Boston  Primary Care Provider: Seward Carol, MD Consulting Provider: Jeral Pinch, MD   Assessment/Plan:  Postmenopausal patient with history of supracervical hysterectomy years ago now with rectal mass and biopsies concerning for locally advanced gyn malignancy.  I reviewed with the patient and her husband, who accompanied her today, work up this far including imaging, G.I. evaluation of her rectum and sigmoid, tumor markers, and biopsy of intraluminal rectal mass. 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 Initially this was reported as possibly being peritoneal or tubo-ovarian in origin, more recent conversation with pathologist indicates that it could also be cervical.   Patient had an MRI of the pelvis yesterday. We looked at the images together. While I am not able to see a definitive plan between the cervix and this mass within the posterior cul-de-sac involving the rectum, the radiologist notes that mass is in close proximity to the cervix but does not appear to communicate directly with it.  External cervix was normal an exam today. Biopsies and Pap smears performed, but I assume that these will come back normal. I cannot distinguish the mass as separate from the cervix on rectovaginal exam.  We discussed that from a gynecologic cancer standpoint, the treatment if this is primary peritoneal or tubal carcinoma would be very different than if it was cervical. We discussed that the mainstay for locally advanced cervix cancer is chemoradiation whereas we would use the neoadjuvant chemotherapy approach if primary surgical debulking was not feasible in the setting of ovarian, fallopian tube, and primary  peritoneal cancer. Given distribution of disease on imaging, with no peritoneal spread, I feel that primary peritoneal or tubal malignancy seems unlikely.   I do feel that more information is necessary prior to proceeding with making a definitive treatment plan. I spoke with Dr. Johney Maine today and recommended to the patient that we move forward with scheduling exploratory surgery, bowel diversion, possible biopsies, and possible tumor to bulking, although I think the chance of tumor to bulking at the time of upcoming surgery would be very unlikely. My hope is that by intraperitoneal examination, either by a minimally invasive manner or via laparotomy, we may be able to better distinguish the origin of this mass and obtain additional biopsies for pathologic diagnosis. Given size of the mass in her rectum, I fear that she is an impending bowel obstruction. Regardless of the organ of origin, I think she would benefit from stool diversion.   Will plan to have the patient return on Monday for a preoperative visit. Will coordinate surgery including bowel prep with Dr. Johney Maine' office.  A copy of this note was sent to the patient's referring provider.   80 minutes of total time was spent for this patient encounter, including preparation, face-to-face counseling with the patient and coordination of care, and documentation of the encounter.   Jeral Pinch, MD  Division of Gynecologic Oncology  Department of Obstetrics and Gynecology  [facility]  ___________________________________________  Chief Complaint: No chief complaint on file.   History of Present Illness:  Mariah Schultz is a 65 y.o. y.o. female who is seen in consultation at the request of Dr. Johney Maine for an evaluation of deep pelvic mass with rectal invasion and biopsy favoring  GYN origin.  Patient notes about a year history of change in bowel function.  She describes this as being constipated although having a  bowel movement every day without needing to strain, but describes her stool as hard and very small quantity.  She ultimately saw her PCP, Dr. Delfina Redwood, in September secondary to reports of a month of worsening change to her bowel function.  Today, she describes this as having softer but even smaller bowel movements starting in early August, often no bigger than the size of a cherry.  She then had severe diarrhea for a day and then began having elongated but very narrow stools with increased frequency (often going 3-5 times a night).  She then began having some discharge and bloody drainage from her rectum.  Given symptoms, CT of the abdomen and pelvis without contrast performed on 05/11/21 demonstrated a 7.5 x 6.9 enhancing mass located at the posterior pelvis concerning for colorectal malignancy as well as possible metastatic adenopathy.  Patient was subsequently referred to Dr. Alessandra Bevels at [facility] on 05/20/21 for further evaluation.  The flexible sigmoidoscopy was poor due to poor bowel prep.  There was a large amount of solid stool in the rectum, rectosigmoid colon and distal sigmoid colon which interfered with visualization.  The scope could not be advanced beyond 20 to 25 cm.  Recto-sigmoid biopsy performed on 05/24/21 by Dr. Alessandra Bevels revealed poorly differentiated high grade carcinoma.   A PET scan was performed at the end of September showing a hypermetabolic mass adjacent to the rectum with an ill-defined hypermetabolic nodule more inferiorly on the left within the perirectal fat thought to be a lymph node.  New small bilateral solid pulmonary nodules were also seen below the resolution for PET.  Patient saw Dr. Lorenso Courier (medical oncology) a week ago.  Tumor markers were obtained including CA-125, CEA, and CA 19.9 and were all normal.   She then saw Dr. Johney Maine (surgery) as well as Dr. Sondra Come (radiation oncology) and has undergone simulation for possible radiation.    Imaging work-up  thus far: 05/12/2021: CT abdomen performed due to abdominal bloating/discomfort and small frequent night time BM. Findings showed a 7.5 x 6.9 cm heterogeneously enhancing mass is noted posteriorly in the pelvis anterior to the sacrum which is highly concerning for colorectal malignancy with possible adjacent metastatic adenopathy. 05/24/2021: colonoscopy performed by [facility]. Biopsy showed poorly differentiated high grade carcinoma, differential including tuboovarian and peritoneal. Immunophemotype not consistent with colorectal adenocarcinoma.  06/04/2021: PET CT scan performed, findings show hypermetabolic mass adjacent to the rectum, likely due to colorectal primary neoplasm and ill-defined hypermetabolic nodule is seen more inferiorly in the left perirectal fat, likely a lymph node metastasis. She was also noted to have Numerous small bilateral small solid pulmonary nodules which are below resolution for PET-CT. 06/17/21: MRI of the pelvis reveals an 8.7 x 6.3 x 7.2 cm mass in the upper and mid rectum.  The tumor is noted to abut the posterior wall of the cervix but does not appear to arise from or definitively invade into it.  The bulk of the tumor is centrally located in the midline pelvis involving the rectum and cul-de-sac.  Patient denies any vaginal bleeding or discharge.  She had a hysterectomy in 2003 or 2004 for fibroids, heavy bleeding and pain.  She denies any postmenopausal bleeding.  Per chart review, she had a negative Pap smear in January 2018, was HPV negative at that time.  She notes appetite has been up and  down.  Overall, she eats less and thinks she has lost 4 to 5 pounds in the last year.  She notes that her abdomen is distended and endorses early satiety as well as bloating.  She denies any nausea or emesis.  She denies urinary symptoms.  Patient lives in [place] with her husband and their dog.  She endorses tobacco use with 1 pack/day and has smoked since her early teens.  She  drinks 1/2 to 1 glass of red wine most days.  PAST MEDICAL HISTORY:  Past Medical History:  Diagnosis Date   Hypertension    Papilloma of left breast    Papilloma of left breast 02/26/2019     PAST SURGICAL HISTORY:  Past Surgical History:  Procedure Laterality Date   ABDOMINAL HYSTERECTOMY     APPENDECTOMY     BREAST EXCISIONAL BIOPSY Left 02/26/2019   B9 Biopsy   BREAST LUMPECTOMY WITH RADIOACTIVE SEED LOCALIZATION Left 02/26/2019   Procedure: LEFT BREAST LUMPECTOMY WITH RADIOACTIVE SEED LOCALIZATION;  Surgeon: Fanny Skates, MD;  Location: [facility];  Service: General;  Laterality: Left;   LAPAROSCOPIC APPENDECTOMY N/A 02/25/2013   Procedure: APPENDECTOMY LAPAROSCOPIC;  Surgeon: Harl Bowie, MD;  Location: [facility];  Service: General;  Laterality: N/A;    OB/GYN HISTORY:  OB History  Gravida Para Term Preterm AB Living  3 3          SAB IAB Ectopic Multiple Live Births               # Outcome Date GA Lbr Len/2nd Weight Sex Delivery Anes PTL Lv  3 Para           2 Para           1 Para             No LMP recorded. Patient has had a hysterectomy.  Age at menarche: 50 Age at menopause: 77 Hx of HRT: Denies Hx of STDs: Denies Last pap: 2018 History of abnormal pap smears: Denies  SCREENING STUDIES:  Last mammogram: 20/2022  Last colonoscopy: see HPI  MEDICATIONS: Outpatient Encounter Medications as of 06/18/2021  Medication Sig   albuterol (PROVENTIL HFA;VENTOLIN HFA) 108 (90 BASE) MCG/ACT inhaler Inhale 2 puffs into the lungs every 6 (six) hours as needed for wheezing or shortness of breath.   aspirin 81 MG tablet Take 81 mg by mouth daily.   Multiple Vitamin (MULTIVITAMIN) tablet Take 1 tablet by mouth daily.   Omega-3 Fatty Acids (FISH OIL PO) Take 1 capsule by mouth daily.   traMADol (ULTRAM) 50 MG tablet Take 1 tablet (50 mg total) by mouth every 8 (eight) hours as needed. (Patient taking differently: Take 50 mg by mouth every 8 (eight)  hours as needed (pain).)   valsartan-hydrochlorothiazide (DIOVAN-HCT) 160-12.5 MG tablet Take 1 tablet by mouth daily.   vitamin C (ASCORBIC ACID) 500 MG tablet Take 500 mg by mouth daily.   No facility-administered encounter medications on file as of 06/18/2021.    ALLERGIES:  Allergies  Allergen Reactions   Other Swelling    pneumonia vaccine     FAMILY HISTORY:  Family History  Problem Relation Age of Onset   Heart disease Father    Colon cancer Neg Hx    Breast cancer Neg Hx    Ovarian cancer Neg Hx    Pancreatic cancer Neg Hx    Prostate cancer Neg Hx    Endometrial cancer Neg Hx  Patient unaware     SOCIAL HISTORY:  Social Connections: Not on file    REVIEW OF SYSTEMS:  Pertinent positives as per HPI.  Patient also endorses fatigue Denies fevers, chills, unexplained weight changes. Denies hearing loss, neck lumps or masses, mouth sores, ringing in ears or voice changes. Denies cough or wheezing.  Denies shortness of breath. Denies chest pain or palpitations. Denies leg swelling. Denies abdominal distention, pain, blood in stools, diarrhea, nausea, vomiting, or early satiety. Denies pain with intercourse, dysuria, frequency, hematuria or incontinence. Denies hot flashes, pelvic pain, vaginal bleeding or vaginal discharge.   Denies joint pain, back pain or muscle pain/cramps. Denies itching, rash, or wounds. Denies dizziness, headaches, numbness or seizures. Denies swollen lymph nodes or glands, denies easy bruising or bleeding. Denies anxiety, depression, confusion, or decreased concentration.  Physical Exam:  Vital Signs for this encounter:  Blood pressure 136/84, pulse 77, temperature 98.4 F (36.9 C), temperature source Oral, resp. rate 16, height 5\' 4"  (1.626 m), weight 170 lb (77.1 kg), SpO2 98 %. Body mass index is 29.18 kg/m. General: Alert, oriented, no acute distress.  HEENT: Normocephalic, atraumatic. Sclera anicteric.  Chest: Clear to  auscultation bilaterally. No wheezes, rhonchi, or rales. Cardiovascular: Regular rate and rhythm, no murmurs, rubs, or gallops.  Abdomen: Normoactive bowel sounds. Soft, mildly distended, nontender to palpation. No masses or hepatosplenomegaly appreciated. No palpable fluid wave.  Extremities: Grossly normal range of motion. Warm, well perfused. No edema bilaterally.  Skin: No rashes or lesions.  Lymphatics: No cervical, supraclavicular, or inguinal adenopathy.  GU:  Normal external female genitalia. No lesions. No discharge or bleeding.             Bladder/urethra:  No lesions or masses, well supported bladder             Vagina: Mildly atrophic vaginal mucosa.  No bleeding or discharge.             Cervix: Normal appearing, no lesions.  Pap test collected.             Uterus: Surgically absent.             Bimanual and rectovaginal exam: On bimanual exam, the cervix itself is normal however there is nodularity involvement of the parametria.  On rectal exam, there is a mass that can be appreciated approximately 6 cm with in the rectum.  It is difficult to distinguish this mass anteriorly as being separate from the cervix.  LABORATORY AND RADIOLOGIC DATA:  Outside medical records were reviewed to synthesize the above history, along with the history and physical obtained during the visit.   Lab Results  Component Value Date   WBC 8.6 06/11/2021   HGB 14.2 06/11/2021   HCT 42.1 06/11/2021   PLT 334 06/11/2021   GLUCOSE 107 (H) 06/11/2021   ALT 16 06/11/2021   AST 17 06/11/2021   NA 140 06/11/2021   K 3.8 06/11/2021   CL 103 06/11/2021   CREATININE 0.74 06/11/2021   BUN 13 06/11/2021   CO2 27 06/11/2021   CA 19.9: <2 CEA: 3.19 CA-125: 9.1  MRI pelvis 10/13: ADDENDUM: Pathology results show poorly differentiated carcinoma, which is NOT that of colorectal adenocarcinoma, but suspected primary GYN or peritoneal origin. Cervical carcinoma is suspected clinically.   The patient has  undergone a supracervical hysterectomy, however the cervix is well seen and the tumor abuts its posterior wall, but does not appear to arise from, or definitely invade into, the cervix. This  bulky tumor is centered squarely in the midline posterior pelvis, involving the rectum and cul-de-sac, and could be of peritoneal or tubovarian origin. This was discussed with Dr. Johney Maine by telephone on 06/18/2021.  TUMOR DESCRIPTION   Circumferential Extent: 100%   Tumor Length: Bulky tumor in the upper and mid rectum, with length of 8.9 cm; overall tumor dimensions are 8.7 x 6.3 x 7.2 cm   T - CATEGORY   Extension through Muscularis Propria: Yes = T3   Shortest Distance of any tumor/node from Mesorectal Fascia: 0 mm, tumor directly abuts the mesorectal fascia posteriorly   Extramural Vascular Invasion/Tumor Thrombus: Yes (e.g. on Image 16/9)   Invasion of Anterior Peritoneal Reflection: No   Involvement of Adjacent Organs or Pelvic Sidewall: No   Levator Ani Involvement: No   N - CATEGORY   Regional Lymph Nodes >=77mm: Total of at least 6 (see series 8) =N2a. Largest lymph node in left posterior perirectal space measures 1.7 cm.   Other:  Sigmoid diverticulosis, without evidence of diverticulitis.   IMPRESSION: Rectal adenocarcinoma T stage: T3   Rectal adenocarcinoma N stage:  N2a   Distance from tumor to the internal anal sphincter is 4.7 cm.

## 2021-06-18 ENCOUNTER — Other Ambulatory Visit (HOSPITAL_COMMUNITY)
Admission: RE | Admit: 2021-06-18 | Discharge: 2021-06-18 | Disposition: A | Discharge status: Home / Self Care | Payer: Medicare HMO | Source: Ambulatory Visit | Attending: Gynecologic Oncology | Admitting: Gynecologic Oncology

## 2021-06-18 ENCOUNTER — Encounter: Payer: Self-pay | Admitting: Gynecologic Oncology

## 2021-06-18 ENCOUNTER — Inpatient Hospital Stay: Payer: Medicare HMO | Admitting: Gynecologic Oncology

## 2021-06-18 ENCOUNTER — Other Ambulatory Visit: Payer: Self-pay

## 2021-06-18 ENCOUNTER — Ambulatory Visit: Payer: Self-pay | Admitting: Surgery

## 2021-06-18 VITALS — BP 136/84 | HR 77 | Temp 98.4°F | Resp 16 | Ht 64.0 in | Wt 170.0 lb

## 2021-06-18 DIAGNOSIS — R6881 Early satiety: Secondary | ICD-10-CM | POA: Diagnosis not present

## 2021-06-18 DIAGNOSIS — C480 Malignant neoplasm of retroperitoneum: Secondary | ICD-10-CM | POA: Diagnosis not present

## 2021-06-18 DIAGNOSIS — R1900 Intra-abdominal and pelvic swelling, mass and lump, unspecified site: Secondary | ICD-10-CM

## 2021-06-18 DIAGNOSIS — K6289 Other specified diseases of anus and rectum: Secondary | ICD-10-CM | POA: Diagnosis not present

## 2021-06-18 DIAGNOSIS — C801 Malignant (primary) neoplasm, unspecified: Secondary | ICD-10-CM

## 2021-06-18 DIAGNOSIS — R9389 Abnormal findings on diagnostic imaging of other specified body structures: Secondary | ICD-10-CM | POA: Diagnosis not present

## 2021-06-18 DIAGNOSIS — Z124 Encounter for screening for malignant neoplasm of cervix: Secondary | ICD-10-CM | POA: Diagnosis not present

## 2021-06-18 DIAGNOSIS — G893 Neoplasm related pain (acute) (chronic): Secondary | ICD-10-CM | POA: Diagnosis not present

## 2021-06-18 DIAGNOSIS — R194 Change in bowel habit: Secondary | ICD-10-CM | POA: Diagnosis not present

## 2021-06-18 DIAGNOSIS — R739 Hyperglycemia, unspecified: Secondary | ICD-10-CM

## 2021-06-18 DIAGNOSIS — F17210 Nicotine dependence, cigarettes, uncomplicated: Secondary | ICD-10-CM | POA: Diagnosis not present

## 2021-06-18 DIAGNOSIS — D375 Neoplasm of uncertain behavior of rectum: Secondary | ICD-10-CM | POA: Diagnosis not present

## 2021-06-18 NOTE — Progress Notes (Signed)
Patient here with her husband for a pre-operative appointment prior to her scheduled surgery on June 22, 2021. She is scheduled for  diagnostic laparoscopy, possible biopsies, possible robotic assisted vs open tumor debulking, possible exploratory laparotomy, possible bowel surgery (performed by Dr. Johney Maine), possible colostomy (performed by Dr. Johney Maine). She had her pre-admission testing appointment this am at [facility].  The surgery was discussed in detail.  See after visit summary for additional details. Visual aids used to discuss items related to surgery including the incentive spirometer, sequential compression stockings, foley catheter, IV pump, multi-modal pain regimen including tylenol, photo of the surgical robot.   Discussed post-op pain management in detail including the aspects of the enhanced recovery pathway. Her medications will be sent in at the time of discharge.    Discussed the use of heparin pre-op, SCDs, and measures to take at home to prevent DVT including frequent mobility.  Reportable signs and symptoms of DVT discussed. Post-operative instructions discussed and expectations for after surgery. Incisional care discussed as well including reportable signs and symptoms including erythema, drainage, wound separation.     10 minutes spent with the patient.  Verbalizing understanding of material discussed. No needs or concerns voiced at the end of the visit.   Advised patient and family to call for any needs. She is leaving this appointment and going to get her COVID test then home to begin her bowel prep per Dr. Johney Maine.   This appointment is included in the global surgical bundle as pre-operative teaching and has no charge.

## 2021-06-18 NOTE — Patient Instructions (Signed)
DUE TO COVID-19 ONLY ONE VISITOR IS ALLOWED TO COME WITH YOU AND STAY IN THE WAITING ROOM ONLY DURING PRE OP AND PROCEDURE DAY OF SURGERY IF YOU ARE GOING HOME AFTER SURGERY. IF YOU ARE SPENDING THE NIGHT 2 PEOPLE MAY VISIT WITH YOU IN YOUR PRIVATE ROOM AFTER SURGERY UNTIL VISITING  HOURS ARE OVER AT 8:00 PM AND 1 VISITOR CAN  SPEND THE NIGHT.   YOU NEED TO HAVE A COVID 19 TEST ON__10/17____THIS TEST MUST BE DONE BEFORE SURGERY,  COVID TESTING SITE  IS LOCATED AT [place], [place]. REMAIN IN YOUR CAR THIS IS A DRIVE UP TEST. AFTER YOUR COVID TEST PLEASE WEAR A MASK OUT IN PUBLIC AND SOCIAL DISTANCE AND [place] YOUR HANDS FREQUENTLY, ALSO ASK ALL YOUR CLOSE CONTACT PERSONS TO WEAR A MASK AND SOCIAL DISTANCE AND [place] THEIR HANDS FREQUENTLY ALSO.               Mariah Schultz     Your procedure is scheduled on: 06/22/21   Report to [facility] Main  Entrance   Report to admitting at  11:00 AM     Call this number if you have problems the morning of surgery [phone]   Follow all the instructions from the Dr.'s office  Drink plenty of fluids on the day of prep to prevent dehydration.  DRINK 2 PRESURGERY ENSURE DRINKS THE NIGHT BEFORE SURGERY AT 10:00 PM .   NO SOLIDS AFTER MIDNIGHT THE DAY PRIOR TO THE SURGERY.   NOTHING BY MOUTH EXCEPT CLEAR LIQUIDS UNTIL THREE HOURS PRIOR TO SCHEDULED SURGERY.   PLEASE FINISH PRESURGERY ENSURE DRINK PER SURGEON ORDER 3 HOURS PRIOR TO SCHEDULED SURGERY TIME WHICH NEEDS TO BE COMPLETED AT ___10:30______.    CLEAR LIQUID DIET   Foods Allowed                                                                     Foods Excluded                                                                                         liquids that you cannot  Plain Jell-O any favor except red or purple                                           see through such as: Fruit ices (not with fruit pulp)                                     milk, soups,  orange juice  Iced Popsicles  All solid food Carbonated beverages, regular and diet                                    Cranberry, grape and apple juices Sports drinks like Gatorade Lightly seasoned clear broth or consume(fat free) Sugar     BRUSH YOUR TEETH MORNING OF SURGERY AND RINSE YOUR MOUTH OUT, NO CHEWING GUM CANDY OR MINTS.     Take these medicines the morning of surgery with A SIP OF WATER: use your inhaler and bring it with you                                You may not have any metal on your body including hair pins and              piercings  Do not wear jewelry, make-up, lotions, powders or perfumes, deodorant             Do not wear nail polish on your fingernails.  Do not shave  48 hours prior to surgery.                 Do not bring valuables to the hospital. [place].  Contacts, dentures or bridgework may not be worn into surgery.  Leave suitcase in the car. After surgery it may be brought to your room.    Special Instructions: N/A              Please read over the following fact sheets you were given: _____________________________________________________________________             [facility] - Preparing for Surgery Before surgery, you can play an important role.  Because skin is not sterile, your skin needs to be as free of germs as possible.  You can reduce the number of germs on your skin by washing with CHG (chlorahexidine gluconate) soap before surgery.  CHG is an antiseptic cleaner which kills germs and bonds with the skin to continue killing germs even after washing. Please DO NOT use if you have an allergy to CHG or antibacterial soaps.  If your skin becomes reddened/irritated stop using the CHG and inform your nurse when you arrive at Short Stay. Do not shave (including legs and underarms) for at least 48 hours prior to the first CHG shower.   Please follow these  instructions carefully:  1.  Shower with CHG Soap the night before surgery and the  morning of Surgery.  2.  If you choose to wash your hair, wash your hair first as usual with your  normal  shampoo.  3.  After you shampoo, rinse your hair and body thoroughly to remove the  shampoo.                            4.  Use CHG as you would any other liquid soap.  You can apply chg directly  to the skin and wash                       Gently with a scrungie or clean washcloth.  5.  Apply the CHG Soap to your body ONLY FROM THE NECK DOWN.  Do not use on face/ open                           Wound or open sores. Avoid contact with eyes, ears mouth and genitals (private parts).                       Wash face,  Genitals (private parts) with your normal soap.             6.  Wash thoroughly, paying special attention to the area where your surgery  will be performed.  7.  Thoroughly rinse your body with warm water from the neck down.  8.  DO NOT shower/wash with your normal soap after using and rinsing off  the CHG Soap.                9.  Pat yourself dry with a clean towel.            10.  Wear clean pajamas.            11.  Place clean sheets on your bed the night of your first shower and do not  sleep with pets. Day of Surgery : Do not apply any lotions/deodorants the morning of surgery.  Please wear clean clothes to the hospital/surgery center.  FAILURE TO FOLLOW THESE INSTRUCTIONS MAY RESULT IN THE CANCELLATION OF YOUR SURGERY PATIENT SIGNATURE_________________________________  NURSE SIGNATURE__________________________________  ________________________________________________________________________   Adam Phenix  An incentive spirometer is a tool that can help keep your lungs clear and active. This tool measures how well you are filling your lungs with each breath. Taking long deep breaths may help reverse or decrease the chance of developing breathing (pulmonary) problems (especially  infection) following: A long period of time when you are unable to move or be active. BEFORE THE PROCEDURE  If the spirometer includes an indicator to show your best effort, your nurse or respiratory therapist will set it to a desired goal. If possible, sit up straight or lean slightly forward. Try not to slouch. Hold the incentive spirometer in an upright position. INSTRUCTIONS FOR USE  Sit on the edge of your bed if possible, or sit up as far as you can in bed or on a chair. Hold the incentive spirometer in an upright position. Breathe out normally. Place the mouthpiece in your mouth and seal your lips tightly around it. Breathe in slowly and as deeply as possible, raising the piston or the ball toward the top of the column. Hold your breath for 3-5 seconds or for as long as possible. Allow the piston or ball to fall to the bottom of the column. Remove the mouthpiece from your mouth and breathe out normally. Rest for a few seconds and repeat Steps 1 through 7 at least 10 times every 1-2 hours when you are awake. Take your time and take a few normal breaths between deep breaths. The spirometer may include an indicator to show your best effort. Use the indicator as a goal to work toward during each repetition. After each set of 10 deep breaths, practice coughing to be sure your lungs are clear. If you have an incision (the cut made at the time of surgery), support your incision when coughing by placing a pillow or rolled up towels firmly against it. Once you are able to get out of bed, walk around indoors and cough well. You may stop using the incentive spirometer  when instructed by your caregiver.  RISKS AND COMPLICATIONS Take your time so you do not get dizzy or light-headed. If you are in pain, you may need to take or ask for pain medication before doing incentive spirometry. It is harder to take a deep breath if you are having pain. AFTER USE Rest and breathe slowly and easily. It can be  helpful to keep track of a log of your progress. Your caregiver can provide you with a simple table to help with this. If you are using the spirometer at home, follow these instructions: [place] IF:  You are having difficultly using the spirometer. You have trouble using the spirometer as often as instructed. Your pain medication is not giving enough relief while using the spirometer. You develop fever of 100.5 F (38.1 C) or higher. SEEK IMMEDIATE MEDICAL CARE IF:  You cough up bloody sputum that had not been present before. You develop fever of 102 F (38.9 C) or greater. You develop worsening pain at or near the incision site. MAKE SURE YOU:  Understand these instructions. Will watch your condition. Will get help right away if you are not doing well or get worse. Document Released: 01/02/2007 Document Revised: 11/14/2011 Document Reviewed: 03/05/2007 [facility] Patient Information 2014 [place], [place].   ________________________________________________________________________

## 2021-06-18 NOTE — Patient Instructions (Addendum)
Preparing for your Surgery  Plan for surgery on June 22, 2021 with Dr. Jeral Pinch and Dr. Neysa Bonito with General Surgery at [place] will be scheduled for a diagnostic laparoscopy, possible biopsies, possible robotic assisted vs open tumor debulking, possible exploratory laparotomy, possible bowel surgery (performed by Dr. Johney [place]), possible colostomy (performed by Dr. Johney [place]).   Pre-operative Testing - (DONE) You will receive a phone call from presurgical testing at [facility] to arrange for a pre-operative appointment and lab work.  -Bring your insurance card, copy of an advanced directive if applicable, medication list  -At that visit, you will be asked to sign a consent for a possible blood transfusion in case a transfusion becomes necessary during surgery.  The need for a blood transfusion is rare but having consent is a necessary part of your care.     -You should not be taking blood thinners or aspirin at least ten days prior to surgery unless instructed by your surgeon.  -Do not take supplements such as fish oil (omega 3), red yeast rice, turmeric before your surgery. You want to avoid medications with aspirin in them including headache powders such as BC or Goody's), Excedrin migraine.  Day Before Surgery at [place] DR. GROSS.  Your role in recovery Your role is to become active as soon as directed by your doctor, while still giving yourself time to heal.  Rest when you feel tired. You will be asked to do the following in order to speed your recovery:  - Cough and breathe deeply. This helps to clear and expand your lungs and can prevent pneumonia after surgery.  - [place]. Do mild physical activity. Walking or moving your legs help your circulation and body functions return to normal. Do not try to get up or walk alone the first time after surgery.   -If you develop swelling on one leg or the other, pain  in the back of your leg, redness/warmth in one of your legs, please call the office or go to the Emergency Room to have a doppler to rule out a blood clot. For shortness of breath, chest pain-seek care in the Emergency Room as soon as possible. - Actively manage your pain. Managing your pain lets you move in comfort. We will ask you to rate your pain on a scale of zero to 10. It is your responsibility to tell your doctor or nurse where and how much you hurt so your pain can be treated.  Special Considerations -If you are diabetic, you may be placed on insulin after surgery to have closer control over your blood sugars to promote healing and recovery.  This does not mean that you will be discharged on insulin.  If applicable, your oral antidiabetics will be resumed when you are tolerating a solid diet.  -Your final pathology results from surgery should be available around one week after surgery and the results will be relayed to you when available.  -Dr. Lahoma Crocker is the surgeon that assists your GYN Oncologist with surgery.  If you end up staying the night, the next day after your surgery you will either see Dr. Denman George, Dr. Berline Lopes, or Dr. Lahoma Crocker.  -FMLA forms can be faxed to [phone] and please allow 5-7 business days for completion.  Pain Management After Surgery -Make sure that you have Tylenol and Ibuprofen at home to use on a regular basis after surgery for pain control.  We recommend alternating the medications every hour to six hours since they work differently and are processed in the body differently for pain relief.   Bowel Regimen -Your after surgery recommendations will be provided after surgery based on what was performed. It is important to prevent constipation and drink adequate amounts of liquids.   Risks of Surgery Risks of surgery are low but include bleeding, infection, damage to surrounding structures, re-operation, blood clots, and very rarely  death.   Blood Transfusion Information (For the consent to be signed before surgery)  We will be checking your blood type before surgery so in case of emergencies, we will know what type of blood you would need.                                            WHAT IS A BLOOD TRANSFUSION?  A transfusion is the replacement of blood or some of its parts. Blood is made up of multiple cells which provide different functions. Red blood cells carry oxygen and are used for blood loss replacement. White blood cells fight against infection. Platelets control bleeding. Plasma helps clot blood. Other blood products are available for specialized needs, such as hemophilia or other clotting disorders. BEFORE THE TRANSFUSION  Who gives blood for transfusions?  You may be able to donate blood to be used at a later date on yourself (autologous donation). Relatives can be asked to donate blood. This is generally not any safer than if you have received blood from a stranger. The same precautions are taken to ensure safety when a relative's blood is donated. Healthy volunteers who are fully evaluated to make sure their blood is safe. This is blood bank blood. Transfusion therapy is the safest it has ever been in the practice of medicine. Before blood is taken from a donor, a complete history is taken to make sure that person has no history of diseases nor engages in risky social behavior (examples are intravenous drug use or sexual activity with multiple partners). The donor's travel history is screened to minimize risk of transmitting infections, such as malaria. The donated blood is tested for signs of infectious diseases, such as HIV and hepatitis. The blood is then tested to be sure it is compatible with you in order to minimize the chance of a transfusion reaction. If you or a relative donates blood, this is often done in anticipation of surgery and is not appropriate for emergency situations. It takes many days to  process the donated blood. RISKS AND COMPLICATIONS Although transfusion therapy is very safe and saves many lives, the main dangers of transfusion include:  Getting an infectious disease. Developing a transfusion reaction. This is an allergic reaction to something in the blood you were given. Every precaution is taken to prevent this. The decision to have a blood transfusion has been considered carefully by your caregiver before blood is given. Blood is not given unless the benefits outweigh the risks.  AFTER SURGERY INSTRUCTIONS  Return to work: 6 weeks if applicable  Activity: 1. Be up and out of the bed during the day.  Take a nap if needed.  You may walk up steps but be careful and use the hand rail.  Stair climbing will tire you more than you think, you may need to stop part way and rest.   2. No lifting or straining for 6 weeks  over 10 pounds. No pushing, pulling, straining for 6 weeks.  3. No driving for 1-2 week(s).  Do not drive if you are taking narcotic pain medicine and make sure that your reaction time has returned.   4. You can shower as soon as the next day after surgery. Shower daily.  Use your regular soap and water (not directly on the incision) and pat your incision(s) dry afterwards; don't rub.  No tub baths or submerging your body in water until cleared by your surgeon. If you have the soap that was given to you by pre-surgical testing that was used before surgery, you do not need to use it afterwards because this can irritate your incisions.   5. No sexual activity and nothing in the vagina for 4 weeks. Subject to change based on procedure performed.  6. You may experience a small amount of clear drainage from your incision(s), which is normal.  If the drainage persists, increases, or changes color please call the office.  7. Do not use creams, lotions, or ointments such as neosporin on your incisions after surgery until advised by your surgeon because they can cause  removal of the dermabond glue on your incisions.    8. Take Tylenol or ibuprofen first for pain and only use narcotic pain medication for severe pain not relieved by the Tylenol or Ibuprofen.  Monitor your Tylenol intake to a max of 4,000 mg in a 24 hour period. You can alternate these medications after surgery.  Diet: 1. Low sodium Heart Healthy Diet is recommended but you are cleared to resume your normal (before surgery) diet after your procedure.  Wound Care: 1. Keep clean and dry.  Shower daily.  Reasons to call the Doctor: Fever - Oral temperature greater than 100.4 degrees Fahrenheit Foul-smelling vaginal discharge Difficulty urinating Nausea and vomiting Increased pain at the site of the incision that is unrelieved with pain medicine. Difficulty breathing with or without chest pain New calf pain especially if only on one side Sudden, continuing increased vaginal bleeding with or without clots.   Contacts: For questions or concerns you should contact:  Dr. Jeral Pinch at [phone]  Joylene John, NP at [phone]  After Hours: call [phone] and have the GYN Oncologist paged/contacted (after 5 pm or on the weekends).  Messages sent via mychart are for non-urgent matters and are not responded to after hours so for urgent needs, please call the after hours number.

## 2021-06-18 NOTE — Patient Instructions (Signed)
Based on your exam today, the cervix looks normal, but I can't feel that there is a separation between the in your rectum and the back of the cervix. I am going to talk with Dr. Johney [place] about a joint procedure that may involve additional biopsies, diversion of your colon, or removal of this large mass.

## 2021-06-21 ENCOUNTER — Encounter (HOSPITAL_COMMUNITY): Payer: Self-pay

## 2021-06-21 ENCOUNTER — Encounter (HOSPITAL_COMMUNITY)
Admission: RE | Admit: 2021-06-21 | Discharge: 2021-06-21 | Disposition: A | Discharge status: Home / Self Care | Payer: Medicare HMO | Source: Ambulatory Visit | Attending: Gynecologic Oncology | Admitting: Gynecologic Oncology

## 2021-06-21 ENCOUNTER — Other Ambulatory Visit: Payer: Self-pay | Admitting: Gynecologic Oncology

## 2021-06-21 ENCOUNTER — Telehealth: Payer: Self-pay | Admitting: Oncology

## 2021-06-21 ENCOUNTER — Other Ambulatory Visit: Payer: Self-pay

## 2021-06-21 ENCOUNTER — Inpatient Hospital Stay (HOSPITAL_BASED_OUTPATIENT_CLINIC_OR_DEPARTMENT_OTHER): Payer: Medicare HMO | Admitting: Gynecologic Oncology

## 2021-06-21 VITALS — BP 138/80 | HR 67 | Temp 98.1°F | Resp 16 | Ht 64.0 in | Wt 168.0 lb

## 2021-06-21 DIAGNOSIS — Z8249 Family history of ischemic heart disease and other diseases of the circulatory system: Secondary | ICD-10-CM | POA: Diagnosis not present

## 2021-06-21 DIAGNOSIS — R1900 Intra-abdominal and pelvic swelling, mass and lump, unspecified site: Secondary | ICD-10-CM | POA: Diagnosis not present

## 2021-06-21 DIAGNOSIS — K5730 Diverticulosis of large intestine without perforation or abscess without bleeding: Secondary | ICD-10-CM | POA: Diagnosis present

## 2021-06-21 DIAGNOSIS — C19 Malignant neoplasm of rectosigmoid junction: Secondary | ICD-10-CM | POA: Diagnosis not present

## 2021-06-21 DIAGNOSIS — C44529 Squamous cell carcinoma of skin of other part of trunk: Secondary | ICD-10-CM | POA: Diagnosis not present

## 2021-06-21 DIAGNOSIS — Z01818 Encounter for other preprocedural examination: Secondary | ICD-10-CM | POA: Insufficient documentation

## 2021-06-21 DIAGNOSIS — K6289 Other specified diseases of anus and rectum: Secondary | ICD-10-CM

## 2021-06-21 DIAGNOSIS — N736 Female pelvic peritoneal adhesions (postinfective): Secondary | ICD-10-CM | POA: Diagnosis present

## 2021-06-21 DIAGNOSIS — I10 Essential (primary) hypertension: Secondary | ICD-10-CM | POA: Diagnosis present

## 2021-06-21 DIAGNOSIS — F17210 Nicotine dependence, cigarettes, uncomplicated: Secondary | ICD-10-CM | POA: Diagnosis not present

## 2021-06-21 DIAGNOSIS — K565 Intestinal adhesions [bands], unspecified as to partial versus complete obstruction: Secondary | ICD-10-CM | POA: Diagnosis present

## 2021-06-21 DIAGNOSIS — K660 Peritoneal adhesions (postprocedural) (postinfection): Secondary | ICD-10-CM | POA: Diagnosis not present

## 2021-06-21 DIAGNOSIS — E876 Hypokalemia: Secondary | ICD-10-CM | POA: Diagnosis not present

## 2021-06-21 DIAGNOSIS — U071 COVID-19: Secondary | ICD-10-CM | POA: Diagnosis present

## 2021-06-21 DIAGNOSIS — R739 Hyperglycemia, unspecified: Secondary | ICD-10-CM

## 2021-06-21 DIAGNOSIS — D499 Neoplasm of unspecified behavior of unspecified site: Secondary | ICD-10-CM | POA: Diagnosis present

## 2021-06-21 DIAGNOSIS — C801 Malignant (primary) neoplasm, unspecified: Secondary | ICD-10-CM | POA: Diagnosis not present

## 2021-06-21 DIAGNOSIS — J189 Pneumonia, unspecified organism: Secondary | ICD-10-CM | POA: Diagnosis not present

## 2021-06-21 DIAGNOSIS — C20 Malignant neoplasm of rectum: Secondary | ICD-10-CM | POA: Diagnosis present

## 2021-06-21 DIAGNOSIS — C414 Malignant neoplasm of pelvic bones, sacrum and coccyx: Secondary | ICD-10-CM | POA: Diagnosis not present

## 2021-06-21 DIAGNOSIS — Z79899 Other long term (current) drug therapy: Secondary | ICD-10-CM | POA: Diagnosis not present

## 2021-06-21 DIAGNOSIS — C539 Malignant neoplasm of cervix uteri, unspecified: Secondary | ICD-10-CM | POA: Diagnosis present

## 2021-06-21 DIAGNOSIS — K649 Unspecified hemorrhoids: Secondary | ICD-10-CM | POA: Diagnosis present

## 2021-06-21 LAB — URINALYSIS, ROUTINE W REFLEX MICROSCOPIC
Bilirubin Urine: NEGATIVE
Glucose, UA: NEGATIVE mg/dL
Hgb urine dipstick: NEGATIVE
Ketones, ur: NEGATIVE mg/dL
Nitrite: NEGATIVE
Protein, ur: NEGATIVE mg/dL
Specific Gravity, Urine: 1.015 (ref 1.005–1.030)
pH: 5 (ref 5.0–8.0)

## 2021-06-21 LAB — BASIC METABOLIC PANEL
Anion gap: 7 (ref 5–15)
BUN: 15 mg/dL (ref 8–23)
CO2: 28 mmol/L (ref 22–32)
Calcium: 9.7 mg/dL (ref 8.9–10.3)
Chloride: 103 mmol/L (ref 98–111)
Creatinine, Ser: 0.73 mg/dL (ref 0.44–1.00)
GFR, Estimated: >60 mL/min (ref >60)
Glucose, Bld: 113 mg/dL — ABNORMAL HIGH (ref 70–99)
Potassium: 4 mmol/L (ref 3.5–5.1)
Sodium: 138 mmol/L (ref 135–145)

## 2021-06-21 LAB — SURGICAL PATHOLOGY

## 2021-06-21 LAB — CBC
HCT: 44.1 % (ref 36.0–46.0)
Hemoglobin: 14.4 g/dL (ref 12.0–15.0)
MCH: 30.4 pg (ref 26.0–34.0)
MCHC: 32.7 g/dL (ref 30.0–36.0)
MCV: 93.2 fL (ref 80.0–100.0)
Platelets: 379 10*3/uL (ref 150–400)
RBC: 4.73 MIL/uL (ref 3.87–5.11)
RDW: 14.2 % (ref 11.5–15.5)
WBC: 8.8 10*3/uL (ref 4.0–10.5)
nRBC: 0 % (ref 0.0–0.2)

## 2021-06-21 LAB — HEMOGLOBIN A1C
Hgb A1c MFr Bld: 6.5 % — ABNORMAL HIGH (ref 4.8–5.6)
Mean Plasma Glucose: 139.85 mg/dL

## 2021-06-21 NOTE — Telephone Encounter (Signed)
[facility] Pathology and spoke to Julian.  Asked that Dr. Donato Heinz call Dr. Berline Lopes regarding [phone].  She said he will be in this afternoon and she will have him call Dr. Berline Lopes.

## 2021-06-21 NOTE — Consult Note (Signed)
[place] Nurse requested for preoperative stoma site marking  Discussed surgical procedure and stoma creation with patient and husband.  Explained role of the [place] nurse team.  Provided the patient with educational booklet and provided samples of pouching options.  Answered patient's questions.   Examined patient sitting and standing in order to place the marking in the patient's visual field, away from any creases or abdominal contour issues and within the rectus muscle.  Attempted to mark below the patient's belt line, but this was not possible since there are several significant skin folds which appear lower on the abd when the patient leans forward and should be avoided if possible.   Marked for colostomy in the LLQ  _6.5___ cm to the left of the umbilicus and ___8_XQ above the umbilicus.  Marked for ileostomy in the RLQ  __6.5__cm to the right of the umbilicus and  ___1_ cm above the umbilicus.  Patient's abdomen cleansed with CHG wipes at site markings, allowed to air dry prior to marking.Covered mark with thin film transparent dressing to preserve mark until date of surgery.   [place] Nurse team will follow up with patient after surgery for continued ostomy care and teaching if she receives an ostomy. Thank-you,  Julien Girt MSN, [place], [place], [place], [place]

## 2021-06-21 NOTE — Progress Notes (Signed)
COVID test- 06/21/21   PCP - Dr. Carney Living Cardiologist - none  Chest x-ray - no EKG - 06/21/21-chart Stress Test -  ECHO -no  Cardiac Cath - no Pacemaker/ICD device last checked:NA  Sleep Study - no CPAP -   Fasting Blood Sugar - NA Checks Blood Sugar _____ times a day  Blood Thinner Instructions:ASA 81/ Dr. Delfina Redwood Aspirin Instructions:Pt  will see the surgeon after the PAT visit.  Last Dose:10/17  Anesthesia review: yes  Patient denies shortness of breath, fever, cough and chest pain at PAT appointment Pt  gets SOB with 1 flight of stairs. She also has fatigue since her Dx with pelvic mass.  Patient verbalized understanding of instructions that were given to them at the PAT appointment. Patient was also instructed that they will need to review over the PAT instructions again at home before surgery. yes

## 2021-06-22 ENCOUNTER — Ambulatory Visit: Payer: Medicare HMO | Admitting: Radiation Oncology

## 2021-06-22 ENCOUNTER — Other Ambulatory Visit: Payer: Self-pay

## 2021-06-22 ENCOUNTER — Inpatient Hospital Stay (HOSPITAL_COMMUNITY)
Admission: RE | Admit: 2021-06-22 | Discharge: 2021-06-25 | DRG: 329 | Disposition: A | Discharge status: Home Health | Payer: Medicare HMO | Source: Ambulatory Visit | Attending: Surgery | Admitting: Surgery

## 2021-06-22 ENCOUNTER — Inpatient Hospital Stay (HOSPITAL_COMMUNITY): Payer: Medicare HMO | Admitting: Certified Registered"

## 2021-06-22 ENCOUNTER — Encounter (HOSPITAL_COMMUNITY): Payer: Self-pay | Admitting: Gynecologic Oncology

## 2021-06-22 ENCOUNTER — Encounter (HOSPITAL_COMMUNITY)
Admission: RE | Disposition: A | Discharge status: Home Health | Payer: Self-pay | Source: Ambulatory Visit | Attending: Surgery

## 2021-06-22 DIAGNOSIS — Z933 Colostomy status: Secondary | ICD-10-CM

## 2021-06-22 DIAGNOSIS — N736 Female pelvic peritoneal adhesions (postinfective): Secondary | ICD-10-CM | POA: Diagnosis not present

## 2021-06-22 DIAGNOSIS — C44529 Squamous cell carcinoma of skin of other part of trunk: Secondary | ICD-10-CM | POA: Diagnosis not present

## 2021-06-22 DIAGNOSIS — E876 Hypokalemia: Secondary | ICD-10-CM

## 2021-06-22 DIAGNOSIS — K660 Peritoneal adhesions (postprocedural) (postinfection): Secondary | ICD-10-CM | POA: Diagnosis not present

## 2021-06-22 DIAGNOSIS — U071 COVID-19: Secondary | ICD-10-CM | POA: Diagnosis present

## 2021-06-22 DIAGNOSIS — J189 Pneumonia, unspecified organism: Secondary | ICD-10-CM | POA: Diagnosis not present

## 2021-06-22 DIAGNOSIS — K565 Intestinal adhesions [bands], unspecified as to partial versus complete obstruction: Secondary | ICD-10-CM | POA: Diagnosis present

## 2021-06-22 DIAGNOSIS — K56609 Unspecified intestinal obstruction, unspecified as to partial versus complete obstruction: Secondary | ICD-10-CM | POA: Diagnosis present

## 2021-06-22 DIAGNOSIS — F17210 Nicotine dependence, cigarettes, uncomplicated: Secondary | ICD-10-CM | POA: Diagnosis present

## 2021-06-22 DIAGNOSIS — Z8249 Family history of ischemic heart disease and other diseases of the circulatory system: Secondary | ICD-10-CM

## 2021-06-22 DIAGNOSIS — R1900 Intra-abdominal and pelvic swelling, mass and lump, unspecified site: Secondary | ICD-10-CM | POA: Diagnosis not present

## 2021-06-22 DIAGNOSIS — D499 Neoplasm of unspecified behavior of unspecified site: Secondary | ICD-10-CM | POA: Diagnosis present

## 2021-06-22 DIAGNOSIS — C20 Malignant neoplasm of rectum: Secondary | ICD-10-CM | POA: Diagnosis not present

## 2021-06-22 DIAGNOSIS — C801 Malignant (primary) neoplasm, unspecified: Secondary | ICD-10-CM | POA: Diagnosis not present

## 2021-06-22 DIAGNOSIS — C414 Malignant neoplasm of pelvic bones, sacrum and coccyx: Secondary | ICD-10-CM | POA: Diagnosis not present

## 2021-06-22 DIAGNOSIS — K5730 Diverticulosis of large intestine without perforation or abscess without bleeding: Secondary | ICD-10-CM | POA: Diagnosis present

## 2021-06-22 DIAGNOSIS — C19 Malignant neoplasm of rectosigmoid junction: Secondary | ICD-10-CM | POA: Diagnosis not present

## 2021-06-22 DIAGNOSIS — Z79899 Other long term (current) drug therapy: Secondary | ICD-10-CM | POA: Diagnosis not present

## 2021-06-22 DIAGNOSIS — C763 Malignant neoplasm of pelvis: Secondary | ICD-10-CM

## 2021-06-22 DIAGNOSIS — C539 Malignant neoplasm of cervix uteri, unspecified: Secondary | ICD-10-CM | POA: Diagnosis present

## 2021-06-22 DIAGNOSIS — K649 Unspecified hemorrhoids: Secondary | ICD-10-CM | POA: Diagnosis not present

## 2021-06-22 DIAGNOSIS — I10 Essential (primary) hypertension: Secondary | ICD-10-CM | POA: Diagnosis not present

## 2021-06-22 HISTORY — PX: LAPAROSCOPY: SHX197

## 2021-06-22 LAB — SARS CORONAVIRUS 2 (TAT 6-24 HRS): SARS Coronavirus 2: POSITIVE — AB

## 2021-06-22 LAB — TYPE AND SCREEN
ABO/RH(D): A POS
Antibody Screen: NEGATIVE

## 2021-06-22 LAB — ABO/RH: ABO/RH(D): A POS

## 2021-06-22 SURGERY — LAPAROSCOPY, DIAGNOSTIC
Anesthesia: General | Site: Abdomen

## 2021-06-22 MED ORDER — FENTANYL CITRATE (PF) 100 MCG/2ML IJ SOLN
INTRAMUSCULAR | Status: AC
  Filled 2021-06-22: qty 2

## 2021-06-22 MED ORDER — ASCORBIC ACID 500 MG PO TABS
500.0000 mg | ORAL_TABLET | Freq: Every day | ORAL | Status: DC
  Administered 2021-06-23 – 2021-06-25 (×3): 500 mg via ORAL
  Filled 2021-06-22 (×3): qty 1

## 2021-06-22 MED ORDER — ROCURONIUM BROMIDE 10 MG/ML (PF) SYRINGE
PREFILLED_SYRINGE | INTRAVENOUS | Status: AC
  Filled 2021-06-22: qty 10

## 2021-06-22 MED ORDER — ROCURONIUM BROMIDE 10 MG/ML (PF) SYRINGE
PREFILLED_SYRINGE | INTRAVENOUS | Status: DC | PRN
  Administered 2021-06-22: 60 mg via INTRAVENOUS
  Administered 2021-06-22: 20 mg via INTRAVENOUS

## 2021-06-22 MED ORDER — BUPIVACAINE LIPOSOME 1.3 % IJ SUSP
INTRAMUSCULAR | Status: DC | PRN
  Administered 2021-06-22: 20 mL

## 2021-06-22 MED ORDER — POLYETHYLENE GLYCOL 3350 17 GM/SCOOP PO POWD: 1.0000 | Freq: Once | ORAL | Status: DC

## 2021-06-22 MED ORDER — HYDROCHLOROTHIAZIDE 12.5 MG PO TABS
12.5000 mg | ORAL_TABLET | Freq: Every day | ORAL | Status: DC
  Administered 2021-06-23 – 2021-06-25 (×3): 12.5 mg via ORAL
  Filled 2021-06-22 (×4): qty 1

## 2021-06-22 MED ORDER — ORAL CARE MOUTH RINSE: 15.0000 mL | Freq: Once | OROMUCOSAL | Status: AC

## 2021-06-22 MED ORDER — FENTANYL CITRATE PF 50 MCG/ML IJ SOSY
25.0000 ug | PREFILLED_SYRINGE | INTRAMUSCULAR | Status: DC | PRN
  Administered 2021-06-22 (×3): 50 ug via INTRAVENOUS

## 2021-06-22 MED ORDER — POVIDONE-IODINE 10 % EX SWAB: 2.0000 "application " | Freq: Once | CUTANEOUS | Status: DC

## 2021-06-22 MED ORDER — CHLORHEXIDINE GLUCONATE CLOTH 2 % EX PADS: 6.0000 | MEDICATED_PAD | Freq: Once | CUTANEOUS | Status: DC

## 2021-06-22 MED ORDER — BUPIVACAINE LIPOSOME 1.3 % IJ SUSP
INTRAMUSCULAR | Status: AC
  Filled 2021-06-22: qty 20

## 2021-06-22 MED ORDER — MAGIC MOUTHWASH
15.0000 mL | Freq: Four times a day (QID) | ORAL | Status: DC | PRN
  Filled 2021-06-22: qty 15

## 2021-06-22 MED ORDER — PROMETHAZINE HCL 25 MG/ML IJ SOLN
INTRAMUSCULAR | Status: AC
  Filled 2021-06-22: qty 1

## 2021-06-22 MED ORDER — SODIUM CHLORIDE 0.9 % IV SOLN: 250.0000 mL | INTRAVENOUS | Status: DC | PRN

## 2021-06-22 MED ORDER — BISACODYL 5 MG PO TBEC: 20.0000 mg | DELAYED_RELEASE_TABLET | Freq: Once | ORAL | Status: DC

## 2021-06-22 MED ORDER — SODIUM CHLORIDE 0.9 % IV SOLN
2.0000 g | Freq: Two times a day (BID) | INTRAVENOUS | Status: AC
  Administered 2021-06-23: 2 g via INTRAVENOUS
  Filled 2021-06-22: qty 2

## 2021-06-22 MED ORDER — CALCIUM POLYCARBOPHIL 625 MG PO TABS
625.0000 mg | ORAL_TABLET | Freq: Two times a day (BID) | ORAL | Status: DC
  Administered 2021-06-23 – 2021-06-25 (×5): 625 mg via ORAL
  Filled 2021-06-22 (×5): qty 1

## 2021-06-22 MED ORDER — MELATONIN 3 MG PO TABS
3.0000 mg | ORAL_TABLET | Freq: Every evening | ORAL | Status: DC | PRN
  Administered 2021-06-23: 3 mg via ORAL
  Filled 2021-06-22: qty 1

## 2021-06-22 MED ORDER — BUPIVACAINE-EPINEPHRINE (PF) 0.25% -1:200000 IJ SOLN
INTRAMUSCULAR | Status: AC
  Filled 2021-06-22: qty 60

## 2021-06-22 MED ORDER — BUPIVACAINE LIPOSOME 1.3 % IJ SUSP: 20.0000 mL | Freq: Once | INTRAMUSCULAR | Status: DC

## 2021-06-22 MED ORDER — TRAMADOL HCL 50 MG PO TABS
50.0000 mg | ORAL_TABLET | Freq: Four times a day (QID) | ORAL | Status: DC | PRN
  Administered 2021-06-23 – 2021-06-25 (×5): 100 mg via ORAL
  Filled 2021-06-22 (×7): qty 2

## 2021-06-22 MED ORDER — ACETAMINOPHEN 500 MG PO TABS
1000.0000 mg | ORAL_TABLET | ORAL | Status: DC
  Filled 2021-06-22: qty 2

## 2021-06-22 MED ORDER — DIPHENHYDRAMINE HCL 12.5 MG/5ML PO ELIX
12.5000 mg | ORAL_SOLUTION | Freq: Four times a day (QID) | ORAL | Status: DC | PRN
  Administered 2021-06-24: 12.5 mg via ORAL
  Filled 2021-06-22: qty 5

## 2021-06-22 MED ORDER — HYDROMORPHONE HCL 1 MG/ML IJ SOLN
INTRAMUSCULAR | Status: AC
  Administered 2021-06-22: 1 mg
  Filled 2021-06-22: qty 1

## 2021-06-22 MED ORDER — SODIUM CHLORIDE 0.9 % IV SOLN
2.0000 g | INTRAVENOUS | Status: AC
  Administered 2021-06-22: 2 g via INTRAVENOUS
  Filled 2021-06-22: qty 2

## 2021-06-22 MED ORDER — FENTANYL CITRATE (PF) 250 MCG/5ML IJ SOLN
INTRAMUSCULAR | Status: DC | PRN
  Administered 2021-06-22: 100 ug via INTRAVENOUS
  Administered 2021-06-22 (×3): 50 ug via INTRAVENOUS

## 2021-06-22 MED ORDER — ENSURE PRE-SURGERY PO LIQD
592.0000 mL | Freq: Once | ORAL | Status: DC
  Filled 2021-06-22: qty 592

## 2021-06-22 MED ORDER — PROMETHAZINE HCL 25 MG/ML IJ SOLN: 6.2500 mg | INTRAMUSCULAR | Status: DC | PRN

## 2021-06-22 MED ORDER — ALVIMOPAN 12 MG PO CAPS
12.0000 mg | ORAL_CAPSULE | ORAL | Status: AC
  Administered 2021-06-22: 12 mg via ORAL
  Filled 2021-06-22: qty 1

## 2021-06-22 MED ORDER — METRONIDAZOLE 500 MG PO TABS: 1000.0000 mg | ORAL_TABLET | ORAL | Status: DC

## 2021-06-22 MED ORDER — ENALAPRILAT 1.25 MG/ML IV SOLN
0.6250 mg | Freq: Four times a day (QID) | INTRAVENOUS | Status: DC | PRN
  Filled 2021-06-22: qty 1

## 2021-06-22 MED ORDER — ENOXAPARIN SODIUM 40 MG/0.4ML IJ SOSY
40.0000 mg | PREFILLED_SYRINGE | Freq: Once | INTRAMUSCULAR | Status: AC
  Administered 2021-06-22: 40 mg via SUBCUTANEOUS
  Filled 2021-06-22: qty 0.4

## 2021-06-22 MED ORDER — SODIUM CHLORIDE 0.9% FLUSH: 3.0000 mL | INTRAVENOUS | Status: DC | PRN

## 2021-06-22 MED ORDER — ONDANSETRON HCL 4 MG/2ML IJ SOLN
INTRAMUSCULAR | Status: DC | PRN
  Administered 2021-06-22: 4 mg via INTRAVENOUS

## 2021-06-22 MED ORDER — DEXAMETHASONE SODIUM PHOSPHATE 10 MG/ML IJ SOLN
INTRAMUSCULAR | Status: AC
  Filled 2021-06-22: qty 1

## 2021-06-22 MED ORDER — PROCHLORPERAZINE MALEATE 10 MG PO TABS
10.0000 mg | ORAL_TABLET | Freq: Four times a day (QID) | ORAL | Status: DC | PRN
  Filled 2021-06-22: qty 1

## 2021-06-22 MED ORDER — ASPIRIN 81 MG PO CHEW
81.0000 mg | CHEWABLE_TABLET | Freq: Every day | ORAL | Status: DC
  Administered 2021-06-23 – 2021-06-25 (×3): 81 mg via ORAL
  Filled 2021-06-22 (×3): qty 1

## 2021-06-22 MED ORDER — NEOMYCIN SULFATE 500 MG PO TABS: 1000.0000 mg | ORAL_TABLET | ORAL | Status: DC

## 2021-06-22 MED ORDER — DEXAMETHASONE SODIUM PHOSPHATE 10 MG/ML IJ SOLN
INTRAMUSCULAR | Status: DC | PRN
  Administered 2021-06-22: 8 mg via INTRAVENOUS

## 2021-06-22 MED ORDER — METHOCARBAMOL 500 MG PO TABS
1000.0000 mg | ORAL_TABLET | Freq: Four times a day (QID) | ORAL | Status: DC | PRN
  Filled 2021-06-22: qty 2

## 2021-06-22 MED ORDER — GABAPENTIN 300 MG PO CAPS
300.0000 mg | ORAL_CAPSULE | ORAL | Status: AC
  Administered 2021-06-22: 300 mg via ORAL
  Filled 2021-06-22: qty 1

## 2021-06-22 MED ORDER — ALVIMOPAN 12 MG PO CAPS
12.0000 mg | ORAL_CAPSULE | Freq: Two times a day (BID) | ORAL | Status: DC
Start: 2021-06-23 — End: 2021-06-25
  Administered 2021-06-23 – 2021-06-25 (×4): 12 mg via ORAL
  Filled 2021-06-22 (×4): qty 1

## 2021-06-22 MED ORDER — LIDOCAINE HCL (PF) 2 % IJ SOLN
INTRAMUSCULAR | Status: AC
  Filled 2021-06-22: qty 15

## 2021-06-22 MED ORDER — ACETAMINOPHEN 500 MG PO TABS
1000.0000 mg | ORAL_TABLET | Freq: Four times a day (QID) | ORAL | Status: DC
  Administered 2021-06-23 – 2021-06-25 (×8): 1000 mg via ORAL
  Filled 2021-06-22 (×9): qty 2

## 2021-06-22 MED ORDER — EPHEDRINE SULFATE-NACL 50-0.9 MG/10ML-% IV SOSY
PREFILLED_SYRINGE | INTRAVENOUS | Status: DC | PRN
  Administered 2021-06-22: 5 mg via INTRAVENOUS

## 2021-06-22 MED ORDER — BUPIVACAINE-EPINEPHRINE 0.25% -1:200000 IJ SOLN
INTRAMUSCULAR | Status: DC | PRN
  Administered 2021-06-22: 60 mL

## 2021-06-22 MED ORDER — PROCHLORPERAZINE EDISYLATE 10 MG/2ML IJ SOLN: 5.0000 mg | Freq: Four times a day (QID) | INTRAMUSCULAR | Status: DC | PRN

## 2021-06-22 MED ORDER — CHLORHEXIDINE GLUCONATE 0.12 % MT SOLN
15.0000 mL | Freq: Once | OROMUCOSAL | Status: AC
  Administered 2021-06-22: 15 mL via OROMUCOSAL

## 2021-06-22 MED ORDER — ENOXAPARIN SODIUM 40 MG/0.4ML IJ SOSY
40.0000 mg | PREFILLED_SYRINGE | INTRAMUSCULAR | Status: DC
  Administered 2021-06-23 – 2021-06-25 (×3): 40 mg via SUBCUTANEOUS
  Filled 2021-06-22 (×3): qty 0.4

## 2021-06-22 MED ORDER — KETAMINE HCL 10 MG/ML IJ SOLN
INTRAMUSCULAR | Status: DC | PRN
  Administered 2021-06-22: 30 mg via INTRAVENOUS

## 2021-06-22 MED ORDER — PROPOFOL 10 MG/ML IV BOLUS
INTRAVENOUS | Status: AC
  Filled 2021-06-22: qty 20

## 2021-06-22 MED ORDER — IRBESARTAN 150 MG PO TABS
150.0000 mg | ORAL_TABLET | Freq: Every day | ORAL | Status: DC
  Administered 2021-06-23 – 2021-06-25 (×3): 150 mg via ORAL
  Filled 2021-06-22 (×3): qty 1

## 2021-06-22 MED ORDER — LIDOCAINE 2% (20 MG/ML) 5 ML SYRINGE
INTRAMUSCULAR | Status: DC | PRN
  Administered 2021-06-22: 100 mg via INTRAVENOUS

## 2021-06-22 MED ORDER — LIP MEDEX EX OINT
1.0000 "application " | TOPICAL_OINTMENT | Freq: Two times a day (BID) | CUTANEOUS | Status: DC
  Administered 2021-06-22 – 2021-06-24 (×5): 1 via TOPICAL
  Filled 2021-06-22: qty 7

## 2021-06-22 MED ORDER — PROPOFOL 10 MG/ML IV BOLUS
INTRAVENOUS | Status: DC | PRN
  Administered 2021-06-22: 150 mg via INTRAVENOUS

## 2021-06-22 MED ORDER — MIDAZOLAM HCL 2 MG/2ML IJ SOLN
INTRAMUSCULAR | Status: AC
  Filled 2021-06-22: qty 2

## 2021-06-22 MED ORDER — ONDANSETRON HCL 4 MG PO TABS: 4.0000 mg | ORAL_TABLET | Freq: Four times a day (QID) | ORAL | Status: DC | PRN

## 2021-06-22 MED ORDER — VALSARTAN-HYDROCHLOROTHIAZIDE 160-12.5 MG PO TABS: 1.0000 | ORAL_TABLET | Freq: Every day | ORAL | Status: DC

## 2021-06-22 MED ORDER — ALBUTEROL SULFATE HFA 108 (90 BASE) MCG/ACT IN AERS
2.0000 | INHALATION_SPRAY | Freq: Four times a day (QID) | RESPIRATORY_TRACT | Status: DC | PRN
  Filled 2021-06-22: qty 6.7

## 2021-06-22 MED ORDER — HYDROMORPHONE HCL 1 MG/ML IJ SOLN
0.5000 mg | INTRAMUSCULAR | Status: AC | PRN
  Administered 2021-06-22 (×2): 0.5 mg via INTRAVENOUS

## 2021-06-22 MED ORDER — LACTATED RINGERS IV BOLUS: 1000.0000 mL | Freq: Three times a day (TID) | INTRAVENOUS | Status: AC | PRN

## 2021-06-22 MED ORDER — METHOCARBAMOL 1000 MG/10ML IJ SOLN
1000.0000 mg | Freq: Four times a day (QID) | INTRAVENOUS | Status: DC | PRN
  Filled 2021-06-22: qty 10

## 2021-06-22 MED ORDER — DIPHENHYDRAMINE HCL 50 MG/ML IJ SOLN
12.5000 mg | Freq: Four times a day (QID) | INTRAMUSCULAR | Status: DC | PRN
  Administered 2021-06-23: 12.5 mg via INTRAVENOUS
  Filled 2021-06-22: qty 1

## 2021-06-22 MED ORDER — ENSURE PRE-SURGERY PO LIQD: 296.0000 mL | Freq: Once | ORAL | Status: DC

## 2021-06-22 MED ORDER — OXYCODONE HCL 5 MG PO TABS
ORAL_TABLET | ORAL | Status: AC
  Filled 2021-06-22: qty 1

## 2021-06-22 MED ORDER — LACTATED RINGERS IV SOLN: INTRAVENOUS | Status: DC

## 2021-06-22 MED ORDER — ENSURE SURGERY PO LIQD
237.0000 mL | Freq: Two times a day (BID) | ORAL | Status: DC
  Administered 2021-06-23 – 2021-06-25 (×3): 237 mL via ORAL

## 2021-06-22 MED ORDER — OXYCODONE HCL 5 MG/5ML PO SOLN: 5.0000 mg | Freq: Once | ORAL | Status: DC | PRN

## 2021-06-22 MED ORDER — LIDOCAINE 20MG/ML (2%) 15 ML SYRINGE OPTIME
INTRAMUSCULAR | Status: DC | PRN
  Administered 2021-06-22: 1.5 mg/kg/h via INTRAVENOUS

## 2021-06-22 MED ORDER — METOPROLOL TARTRATE 5 MG/5ML IV SOLN: 5.0000 mg | Freq: Four times a day (QID) | INTRAVENOUS | Status: DC | PRN

## 2021-06-22 MED ORDER — SODIUM CHLORIDE 0.9% FLUSH
3.0000 mL | Freq: Two times a day (BID) | INTRAVENOUS | Status: DC
  Administered 2021-06-22 – 2021-06-23 (×3): 3 mL via INTRAVENOUS

## 2021-06-22 MED ORDER — KETAMINE HCL 10 MG/ML IJ SOLN
INTRAMUSCULAR | Status: AC
  Filled 2021-06-22: qty 1

## 2021-06-22 MED ORDER — FENTANYL CITRATE PF 50 MCG/ML IJ SOSY
PREFILLED_SYRINGE | INTRAMUSCULAR | Status: AC
  Filled 2021-06-22: qty 3

## 2021-06-22 MED ORDER — 0.9 % SODIUM CHLORIDE (POUR BTL) OPTIME
TOPICAL | Status: DC | PRN
  Administered 2021-06-22: 1000 mL

## 2021-06-22 MED ORDER — SIMETHICONE 80 MG PO CHEW: 40.0000 mg | CHEWABLE_TABLET | Freq: Four times a day (QID) | ORAL | Status: DC | PRN

## 2021-06-22 MED ORDER — ALUM & MAG HYDROXIDE-SIMETH 200-200-20 MG/5ML PO SUSP: 30.0000 mL | Freq: Four times a day (QID) | ORAL | Status: DC | PRN

## 2021-06-22 MED ORDER — HYDROMORPHONE HCL 1 MG/ML IJ SOLN
0.5000 mg | INTRAMUSCULAR | Status: DC | PRN
  Administered 2021-06-22 – 2021-06-23 (×4): 1 mg via INTRAVENOUS
  Administered 2021-06-24: 0.5 mg via INTRAVENOUS
  Administered 2021-06-24: 1 mg via INTRAVENOUS
  Filled 2021-06-22 (×5): qty 1

## 2021-06-22 MED ORDER — OXYCODONE HCL 5 MG PO TABS
5.0000 mg | ORAL_TABLET | Freq: Once | ORAL | Status: DC | PRN
Start: 2021-06-22 — End: 2021-06-22

## 2021-06-22 MED ORDER — MIDAZOLAM HCL 2 MG/2ML IJ SOLN
INTRAMUSCULAR | Status: DC | PRN
  Administered 2021-06-22: 2 mg via INTRAVENOUS

## 2021-06-22 MED ORDER — ONDANSETRON HCL 4 MG/2ML IJ SOLN
INTRAMUSCULAR | Status: AC
  Filled 2021-06-22: qty 2

## 2021-06-22 MED ORDER — ONDANSETRON HCL 4 MG/2ML IJ SOLN: 4.0000 mg | Freq: Four times a day (QID) | INTRAMUSCULAR | Status: DC | PRN

## 2021-06-22 MED ORDER — ADULT MULTIVITAMIN W/MINERALS CH
1.0000 | ORAL_TABLET | Freq: Every day | ORAL | Status: DC
  Administered 2021-06-23 – 2021-06-25 (×3): 1 via ORAL
  Filled 2021-06-22 (×3): qty 1

## 2021-06-22 MED ORDER — SUGAMMADEX SODIUM 200 MG/2ML IV SOLN
INTRAVENOUS | Status: DC | PRN
  Administered 2021-06-22: 200 mg via INTRAVENOUS

## 2021-06-22 SURGICAL SUPPLY — 105 items
ADH SKN CLS APL DERMABOND .7 (GAUZE/BANDAGES/DRESSINGS) ×2
APL PRP STRL LF DISP 70% ISPRP (MISCELLANEOUS) ×2
APPLIER CLIP 5 13 M/L LIGAMAX5 (MISCELLANEOUS)
APPLIER CLIP ROT 10 11.4 M/L (STAPLE)
APR CLP MED LRG 11.4X10 (STAPLE)
APR CLP MED LRG 5 ANG JAW (MISCELLANEOUS)
BAG COUNTER SPONGE SURGICOUNT (BAG) IMPLANT
BAG SPEC RTRVL LRG 6X4 10 (ENDOMECHANICALS)
BAG SPNG CNTER NS LX DISP (BAG)
CABLE HIGH FREQUENCY MONO STRZ (ELECTRODE) ×3 IMPLANT
CELLS DAT CNTRL 66122 CELL SVR (MISCELLANEOUS) IMPLANT
CHLORAPREP W/TINT 26 (MISCELLANEOUS) ×3 IMPLANT
CLIP APPLIE 5 13 M/L LIGAMAX5 (MISCELLANEOUS) IMPLANT
CLIP APPLIE ROT 10 11.4 M/L (STAPLE) IMPLANT
CNTNR URN SCR LID CUP LEK RST (MISCELLANEOUS) IMPLANT
CONT SPEC 4OZ STRL OR WHT (MISCELLANEOUS)
COUNTER NEEDLE 20 DBL MAG RED (NEEDLE) ×1 IMPLANT
COVER MAYO STAND STRL (DRAPES) ×7 IMPLANT
COVER SURGICAL LIGHT HANDLE (MISCELLANEOUS) ×3 IMPLANT
DECANTER SPIKE VIAL GLASS SM (MISCELLANEOUS) ×1 IMPLANT
DERMABOND ADVANCED (GAUZE/BANDAGES/DRESSINGS) ×1
DERMABOND ADVANCED .7 DNX12 (GAUZE/BANDAGES/DRESSINGS) ×2 IMPLANT
DRAIN CHANNEL 19F RND (DRAIN) IMPLANT
DRAPE LAPAROSCOPIC ABDOMINAL (DRAPES) ×1 IMPLANT
DRAPE SHEET LG 3/4 BI-LAMINATE (DRAPES) ×4 IMPLANT
DRAPE SURG IRRIG POUCH 19X23 (DRAPES) ×3 IMPLANT
DRSG OPSITE POSTOP 4X10 (GAUZE/BANDAGES/DRESSINGS) IMPLANT
DRSG OPSITE POSTOP 4X6 (GAUZE/BANDAGES/DRESSINGS) IMPLANT
DRSG OPSITE POSTOP 4X8 (GAUZE/BANDAGES/DRESSINGS) IMPLANT
DRSG TEGADERM 2-3/8X2-3/4 SM (GAUZE/BANDAGES/DRESSINGS) ×4 IMPLANT
DRSG TEGADERM 4X4.75 (GAUZE/BANDAGES/DRESSINGS) IMPLANT
ELECT PENCIL ROCKER SW 15FT (MISCELLANEOUS) ×2 IMPLANT
ELECT REM PT RETURN 15FT ADLT (MISCELLANEOUS) ×3 IMPLANT
ENDOLOOP SUT PDS II  0 18 (SUTURE)
ENDOLOOP SUT PDS II 0 18 (SUTURE) IMPLANT
EVACUATOR SILICONE 100CC (DRAIN) IMPLANT
GAUZE SPONGE 2X2 8PLY STRL LF (GAUZE/BANDAGES/DRESSINGS) ×2 IMPLANT
GAUZE SPONGE 4X4 12PLY STRL (GAUZE/BANDAGES/DRESSINGS) ×3 IMPLANT
GLOVE SURG ENC MOIS LTX SZ6 (GLOVE) ×6 IMPLANT
GLOVE SURG ENC MOIS LTX SZ6.5 (GLOVE) ×6 IMPLANT
GLOVE SURG NEOPR MICRO LF SZ8 (GLOVE) ×6 IMPLANT
GLOVE SURG UNDER LTX SZ8 (GLOVE) ×6 IMPLANT
GOWN STRL REUS W/ TWL LRG LVL3 (GOWN DISPOSABLE) ×4 IMPLANT
GOWN STRL REUS W/TWL LRG LVL3 (GOWN DISPOSABLE) ×6
GOWN STRL REUS W/TWL XL LVL3 (GOWN DISPOSABLE) ×8 IMPLANT
HOLDER FOLEY CATH W/STRAP (MISCELLANEOUS) IMPLANT
IRRIG SUCT STRYKERFLOW 2 WTIP (MISCELLANEOUS) ×3
IRRIGATION SUCT STRKRFLW 2 WTP (MISCELLANEOUS) ×2 IMPLANT
KIT BASIN OR (CUSTOM PROCEDURE TRAY) ×3 IMPLANT
LEGGING LITHOTOMY PAIR STRL (DRAPES) IMPLANT
MANIPULATOR UTERINE 4.5 ZUMI (MISCELLANEOUS) IMPLANT
NDL BIOPSY 14X6 SOFT TISS (NEEDLE) IMPLANT
NEEDLE BIOPSY 14X6 SOFT TISS (NEEDLE) ×3 IMPLANT
PACK COLON (CUSTOM PROCEDURE TRAY) ×1 IMPLANT
PAD POSITIONING PINK XL (MISCELLANEOUS) ×3 IMPLANT
PENCIL SMOKE EVACUATOR (MISCELLANEOUS) IMPLANT
POUCH SPECIMEN RETRIEVAL 10MM (ENDOMECHANICALS) IMPLANT
PROTECTOR NERVE ULNAR (MISCELLANEOUS) ×2 IMPLANT
RETRACTOR WND ALEXIS 18 MED (MISCELLANEOUS) IMPLANT
RTRCTR WOUND ALEXIS 18CM MED (MISCELLANEOUS)
RTRCTR WOUND ALEXIS 18CM SML (INSTRUMENTS) ×3
SAVER CELL AAL HAEMONETICS (INSTRUMENTS) ×1 IMPLANT
SCISSORS LAP 5X35 DISP (ENDOMECHANICALS) ×3 IMPLANT
SCRUB EXIDINE 4% CHG 4OZ (MISCELLANEOUS) ×1 IMPLANT
SEALER TISSUE G2 CVD JAW 45CM (ENDOMECHANICALS) IMPLANT
SEALER TISSUE G2 STRG ARTC 35C (ENDOMECHANICALS) ×2 IMPLANT
SET TUBE SMOKE EVAC HIGH FLOW (TUBING) ×1 IMPLANT
SHEET LAVH (DRAPES) ×3 IMPLANT
SLEEVE ADV FIXATION 5X100MM (TROCAR) IMPLANT
SLEEVE XCEL OPT CAN 5 100 (ENDOMECHANICALS) ×5 IMPLANT
SPONGE GAUZE 2X2 STER 10/PKG (GAUZE/BANDAGES/DRESSINGS) ×1
SPONGE T-LAP 18X18 ~~LOC~~+RFID (SPONGE) IMPLANT
STAPLER VISISTAT 35W (STAPLE) IMPLANT
SURGILUBE 2OZ TUBE FLIPTOP (MISCELLANEOUS) ×2 IMPLANT
SUT MNCRL AB 4-0 PS2 18 (SUTURE) ×4 IMPLANT
SUT PDS AB 1 CTX 36 (SUTURE) ×2 IMPLANT
SUT PDS AB 1 TP1 96 (SUTURE) IMPLANT
SUT PROLENE 0 CT 2 (SUTURE) IMPLANT
SUT PROLENE 2 0 SH DA (SUTURE) IMPLANT
SUT SILK 2 0 (SUTURE)
SUT SILK 2 0 SH CR/8 (SUTURE) ×1 IMPLANT
SUT SILK 2-0 18XBRD TIE 12 (SUTURE) ×1 IMPLANT
SUT SILK 3 0 (SUTURE)
SUT SILK 3 0 SH CR/8 (SUTURE) ×1 IMPLANT
SUT SILK 3-0 18XBRD TIE 12 (SUTURE) ×1 IMPLANT
SUT VIC AB 3-0 SH 18 (SUTURE) ×2 IMPLANT
SUT VICRYL 0 UR6 27IN ABS (SUTURE) ×1 IMPLANT
SYS LAPSCP GELPORT 120MM (MISCELLANEOUS)
SYS RETRIEVAL 5MM INZII UNIV (BASKET)
SYS WOUND ALEXIS 18CM MED (MISCELLANEOUS)
SYSTEM LAPSCP GELPORT 120MM (MISCELLANEOUS) IMPLANT
SYSTEM RETRIEVL 5MM INZII UNIV (BASKET) IMPLANT
SYSTEM WOUND ALEXIS 18CM MED (MISCELLANEOUS) IMPLANT
TAPE UMBILICAL 1/8 X36 TWILL (MISCELLANEOUS) ×1 IMPLANT
TOWEL OR 17X26 10 PK STRL BLUE (TOWEL DISPOSABLE) ×3 IMPLANT
TOWEL OR NON WOVEN STRL DISP B (DISPOSABLE) ×3 IMPLANT
TRAY FOLEY MTR SLVR 16FR STAT (SET/KITS/TRAYS/PACK) ×3 IMPLANT
TRAY LAPAROSCOPIC (CUSTOM PROCEDURE TRAY) ×3 IMPLANT
TROCAR ADV FIXATION 5X100MM (TROCAR) IMPLANT
TROCAR BLADELESS OPT 5 100 (ENDOMECHANICALS) ×3 IMPLANT
TROCAR XCEL 12X100 BLDLESS (ENDOMECHANICALS) IMPLANT
TROCAR XCEL BLUNT TIP 100MML (ENDOMECHANICALS) IMPLANT
TROCAR XCEL NON-BLD 11X100MML (ENDOMECHANICALS) IMPLANT
TUBING CONNECTING 10 (TUBING) IMPLANT
YANKAUER SUCT BULB TIP 10FT TU (MISCELLANEOUS) ×2 IMPLANT

## 2021-06-22 NOTE — Op Note (Signed)
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   Anesthesia: GET  Urine Output: see I&O flowsheet  Operative Findings: On EUA, normal cervix. On rectovaginal exam, large 8-10cm mass, relatively fixed is noted to be filling the cul de sac, indistinguishable from the cervix. Mass itself palpated in the rectum, smooth.  On intra-abdominal entry, normal appearing liver edge, diaphragm and stomach. Normal omentum. Loop of small bowel adherent along the right pelvic sidewall, suspected to be adherent to presacral/cul de sac mass.. Right ovary atrophic and normal appearing. Right fallopian tube not definitively identified. Left adnexa atrophic and normal appearing, adherent to lateral aspect of sigmoid mesentery. No peritoneal disease or nodularity noted. Fixed mass within the posterior low pelvis, limited mobility of the lower sigmoid and rectum. With spongestick in the vagina, cuff examined intraperitoneal and normal in appearance, no contiguous with this mass.  Estimated Blood Loss:  Minimal for my portion   Total IV Fluids: see I&O flowsheet         Specimens: Biopsies of extraluminal rectal mass         Complications:  None apparent; patient tolerated the procedure well.         Disposition: PACU - hemodynamically stable.  Procedure Details  The patient was seen in the Holding Room. The risks, benefits, complications, treatment options, and expected outcomes were discussed with the patient.  The patient concurred  with the proposed plan, giving informed consent.  The site of surgery properly noted/marked. The patient was identified as Mariah Schultz and the procedure verified as an EUA, diagnostic laparoscopy, possible debulking.   After induction of anesthesia, the patient was draped and prepped in the usual sterile manner. Patient was placed in supine position after anesthesia and draped and prepped in the usual sterile manner as follows: Her arms were tucked to her side with all appropriate precautions.  The shoulders were stabilized with padded shoulder blocks applied to the acromium processes.  The patient was placed in the semi-lithotomy position in [place].  The perineum and vagina were prepped with Betadine. The patient was draped after the CholoraPrep had been allowed to dry for 3 minutes.  A Time Out was held and the above information confirmed.  The urethra was prepped with Betadine. Foley catheter was placed.  OG tube placement was confirmed and to suction.   Next, a 5 mm skin incision was made 1 cm below the subcostal margin in the midclavicular line.  The 5 mm Optiview port and scope was used for direct entry.  Opening pressure was under 10 mm CO2.  The abdomen was insufflated and the findings were noted as above.   The upper abdomen was examined and then the patient was placed in Trendelenburg. At this point and all points during the procedure, the patient's intra-abdominal pressure did not exceed 15 mmHg. Next, two additional 5 mm trocars were placed in the right abdomen and attention was turned to the pelvis.   The case was then turned over to Dr. Johney Maine. After lysis of adhesions of the small bowel to the anterior abdominal wall, the pelvis was further explored. While Dr.  Gross was performing transrectal biopsies, I bluntly and with sharp dissection mobilized the lateral aspect of the sigmoid colon, identifying the left adnexa.   Once biopsies had been performed, the case was again turned over to  Dr. Johney Maine for further pelvic inspection, mobilization of the sigmoid colon, and creation of a sigmoid ostomy with mucous fistula.   Jeral Pinch, MD

## 2021-06-22 NOTE — Progress Notes (Signed)
Mariah Schultz at [facility] aware of patient's covid test result.

## 2021-06-22 NOTE — Progress Notes (Signed)
Spoke with the patient this morning to let her know COVID screening test came back positive. She remains asymptomatic. Given impending bowel obstruction, I feel that her case needs to proceed more urgently and cannot wait for the 10 day delay from COVID test result. I spoke with Dr. Johney Maine who is in agreement.  Spoke with Dr. Donato Heinz yesterday regarding pathology from colon biopsy. He does feel that additional tissue would be helpful for diagnosis. While IHC was not characteristic for rectal adenocarcinoma, was not totally characteristic for ovarian/PP/tubal carcinoma (WT1 negative).   Jeral Pinch MD Gynecologic Oncology

## 2021-06-22 NOTE — Interval H&P Note (Signed)
History and Physical Interval Note:  06/22/2021 1:37 PM  Mariah Schultz  has presented today for surgery, with the diagnosis of PELVIC MASS.  The various methods of treatment have been discussed with the patient and family. After consideration of risks, benefits and other options for treatment, the patient has consented to  Procedure(s): LAPAROSCOPY DIAGNOSTIC (N/A) POSSIBLE ROBOTIC VERSUS EXPLORATORY LAPAROTOMY POSSIBLE DEBULKING, POSSIBLE BIOSPIES (N/A) as a surgical intervention.  The patient's history has been reviewed, patient examined, no change in status, stable for surgery.  I have reviewed the patient's chart and labs.  Questions were answered to the patient's satisfaction.     Mariah Schultz

## 2021-06-22 NOTE — H&P (Signed)
06/22/2021       REFERRING PHYSICIAN:  Otis Brace, MD   Patient Care Team: Seward Carol, MD as PCP - General (Internal Medicine) Johney Maine, Adrian Saran, MD as Consulting Provider (General Surgery) Otis Brace, MD (Gastroenterology) Marye Round, MD (Radiation Oncology) Narda Rutherford, MD (Hematology and Oncology)   PROVIDER:  Hollace Kinnier, MD   [facility] MRN: [id number] DOB: February 07, 1956 DATE OF ENCOUNTER: 06/14/2021   Subjective    Chief Complaint: No chief complaint on file.       History of Present Illness: Mariah Schultz is a 65 y.o. female who is seen today as an office consultation at the request of Dr. Alessandra Bevels for evaluation of Rectal cancer .   Patient who noted some lower abdominal discomfort with bloating and bowel changes.  Underwent CT scan.  Found to have a 7.5 cm pelvic mass.  Concerning for colorectal malignancy.  Consultation made with [facility] gastroenterology.  Dr. Alessandra Bevels performed endoscopy and found a bulky mass.  Biopsy consistent with poorly differentiated carcinoma.  Surgical consultation requested.  Dr. Alessandra Bevels reach out to me.  I recommended MRI pelvis as well as medical and radiation oncology consultations.  She is has seen medical oncology.  They were concerned of perhaps a GYN etiology.  CEA 3.19.  CA 19-9 less than 2.  CA125 9.1.  All in normal range Currently patient had colonoscopy last year that was difficult to do due to tortuosity.  I do not have the endoscopy reports but I have the biopsy reports.  Patient notes her stool caliber has gone down and bowels have changed.  She had a partial hysterectomy done a couple decades ago.  Had an appendectomy when she was much younger as well.  No other abdominal surgeries.  She usually can walk half hour before she has to stop.  She does smoke.  No diabetes.  No cardiac or pulmonary issues.  No family history of bowel problems and she is aware of.  She takes a baby aspirin.  No sleep apnea.            Medical History: Past Medical History      Past Medical History:  Diagnosis Date   History of cancer     Hypertension          There is no problem list on file for this patient.     Past Surgical History       Past Surgical History:  Procedure Laterality Date   APPENDECTOMY       HYSTERECTOMY            Allergies       Allergies  Allergen Reactions   Klebsiella Pneumoniae Swelling      Swelling at site              Current Outpatient Medications on File Prior to Visit  Medication Sig Dispense Refill   amLODIPine (NORVASC) 5 MG tablet Take 5 mg by mouth once daily       benzonatate (TESSALON) 100 MG capsule TAKE 1 CAPSULE 3 TIMES A DAY AS NEEDED FOR COUGH       EUA PAXLOVID 150-100 mg tablet TAKE 3 TABLET TWICE DAILY FOR 5 DAYS       hydroCHLOROthiazide (HYDRODIURIL) 25 MG tablet         traMADoL (ULTRAM) 50 mg tablet         valsartan-hydrochlorothiazide (DIOVAN-HCT) 160-12.5 mg tablet          No current facility-administered medications  on file prior to visit.      Family History  Family History  Family history unknown: Yes        Social History        Tobacco Use  Smoking Status Current Every Day Smoker   Packs/day: 1.00  Smokeless Tobacco Never Used      Social History  Social History         Socioeconomic History   Marital status: Married  Tobacco Use   Smoking status: Current Every Day Smoker      Packs/day: 1.00   Smokeless tobacco: Never Used  Substance and Sexual Activity   Alcohol use: Yes      Comment: 1 glass nighty   Drug use: Never        ############################################################   Fraility Risk:     Preoperative Risks/Screening 1. Frailty Review:              Lives independently? yes             Uses a mobility assist device (cane/walker/wheelchair)? no             History of falls within 3 months? no             Cognitive impairment/dementia? no             Age > 65? yes   2.  Nutrition Screening:             Cancer/IBD? yes             Age >65?  yes             Weight loss >10% in past 6 months? no             If yes to any of the 3 above, consider Impact AR supplemental shake   3. PONV Screening:             History of PONV? no             Female under age of 18? no             History of motion sickness? no   4. Chronic pain issues? no   5. Diabetes? no Last HgbA1c: No results found for: HGBA1C       Review of Systems: A complete review of systems (ROS) was obtained from the patient.  I have reviewed this information and discussed as appropriate with the patient.  See HPI as well for other pertinent ROS.   Constitutional:  No fevers, chills, sweats.  Weight stable Eyes:  No vision changes, No discharge HENT:  No sore throats, nasal drainage Lymph: No neck swelling, No bruising easily Pulmonary:  No cough, productive sputum CV: No orthopnea, PND  Patient walks 30 minutes for about 2 miles without difficulty.  No exertional chest/neck/shoulder/arm pain.   GI:  No personal nor family history of inflammatory bowel disease, irritable bowel syndrome, allergy such as Celiac Sprue, dietary/dairy problems, colitis, ulcers nor gastritis.  No recent sick contacts/gastroenteritis.  No travel outside the country.  No changes in diet.   Renal: No UTIs, No hematuria Genital:  No drainage, bleeding, masses Musculoskeletal: No severe joint pain.  Good ROM major joints Skin:  No sores or lesions Heme/Lymph:  No easy bleeding.  No swollen lymph nodes   Objective:       Vitals:    06/14/21 1122  BP: 122/70  Pulse: 85  Temp: 37 C (98.6 F)  SpO2:  96%  Weight: 77.3 kg (170 lb 6.4 oz)  Height: 165.1 cm (5\' 5" )      Body mass index is 28.36 kg/m.   PHYSICAL EXAM:     Constitutional: Not cachectic.  Hygeine adequate.  Vitals signs as above.   Eyes: Pupils reactive, normal extraocular movements. Sclera nonicteric Neuro: CN II-XII intact.  No major focal  sensory defects.  No major motor deficits. Lymph: No head/neck/groin lymphadenopathy Psych:  No severe agitation.  No severe anxiety.  Judgment & insight Adequate, Oriented x4, HENT: Normocephalic, Mucus membranes moist.  No thrush.   Neck: Supple, No tracheal deviation.  No obvious thyromegaly Chest: No pain to chest wall compression.  Good respiratory excursion.  No audible wheezing CV:  Pulses intact.   Regular rhythm.  No major extremity edema   Abdomen:  Obese Hernia: Not present. Diastasis recti: Mild supraumbilical midline. Soft.   Nondistended.  Nontender.  No hepatomegaly.  No splenomegaly   Gen:  Inguinal hernia: Not present.  Inguinal lymph nodes: without lymphadenopathy.  No vaginal bleeding or discharge by history or exam   Rectal: Perineal skin clear.  Normal sphincter tone.  Sensitive but tolerates exam.  At about 7 cm from the sphincter complex I can feel a bulky fixed mass.  Mucosa mostly feels smooth almost like extrinsic compression but then I do feel some mucosal circumferential narrowing and stricturing.  Concerning for malignancy.   Ext: No obvious deformity or contracture.  Edema: Not present.  No cyanosis Skin: No major subcutaneous nodules.  Warm and dry Musculoskeletal: Severe joint rigidity not present.  No obvious clubbing.  No digital petechiae.        Labs, Imaging and Diagnostic Testing:   Located in [place]' section of Epic EMR chart   PRIOR NOTES     Not applicable   SURGERY NOTES:   Not applicable   PATHOLOGY:   Located in [place]' section of Epic EMR chart    "POORLY DIFFERENTIATED HIGH GRADE CARCINOMA.  Primary tumor site differential considerations include tubo-ovarian and peritoneal.  The immunophenotype is not that of a colorectal adenocarcinoma."     Assessment and Plan:  DIAGNOSES:   Diagnoses and all orders for this visit:   Carcinoma of unknown primary (CMS-HCC)   Pelvic mass in female   Pelvic pain in  female   Other orders -     EXTERNAL PATHOLOGY RESULT -     ENDOSCOPY EXTERNAL -     EXTERNAL RADIOLOGY RESULT - OTHER       ASSESSMENT/PLAN   Worsening abdominal pain and bowel changes with 7.5 cm pelvic mass lesion involving the rectosigmoid and possible ovarian etiology given pathology showing   "POORLY DIFFERENTIATED HIGH GRADE CARCINOMA.  Primary tumor site differential considerations include tubo-ovarian and peritoneal.  The immunophenotype is not that of a colorectal adenocarcinoma."   Serum tumor markers for ovarian and colorectal overall underwhelming.   I agree with Dr. Lorenso Courier with medical oncology we need to step back and regroup on the possible etiology of this mass.  I believe he is internally referring to Dr. Simeon Craft such who tends to manage gynecological oncology issues from a chemotherapy/medical oncology standpoint   In reviewing the flexible sigmoidoscopy, looks like the stricturing of the colon may ask to be more proximal to what I feel.  I suspect that this is extrinsic compression at the distal sigmoid that is now fallen into the pelvis.  We will it could be a sigmoid cancer folding down  and filling the pelvis, the pathology argues against that.   I had recommended an MRI of the pelvis.  We called to get that scheduled this Thursday.  That might help to characterize this tumor etiology and look for involvement with other organs such as bladder, ureters, vessels, etc.   PET scan confirms hyperbolic mass adjacent to the rectum.  Some bilateral pulmonary nodules but are rather small.   Think it would be helpful to have Dr. Wyvonnia Lora with gynecological oncology be involved to see if further work-up could help sort this out.     If the etiology is indeed colorectal, would lean towards probable total neoadjuvant chemotherapy, chemoradiation, then robotic LAR resection 10 weeks later with low anastomosis and probable protective loop ileostomy vs Hartmann/colostomy.  Even  if this is ovarian, sigmoid and much of rectum will have to be removed.   Keep discussion appointment for multidisciplinary GI tumor conference next week     FOLLOWUP:  No follow-ups on file.   I had direct face-to-face contact with the patient for a total of  45 minutes and greater than 50% of that time was spent providing counseling and/or coordination of care for the patient regarding the above.  Long discussion of the plan and the need for multidisciplinary approach to look at multiple possible etiologies as it would affect treatment plans.  I noted that I am leaning more towards an ovarian/gynecological etiology as well.  She can reach out to [place] until all people are seen and discussed to figure out who will lead treatment which should hopefully happen soon.  I do not think this can wait much longer since she is having pain and bowel changes.  Questions answered.  They expressed understanding and appreciation         ########################################################     Adin Hector, MD, FACS, MASCRS Esophageal, Gastrointestinal & Colorectal Surgery Robotic and Minimally Invasive Surgery   [facility], [place]  [place], [place], [place] [address] [phone] Fax [phone] Main                      Note: Portions of this report may have been transcribed using voice recognition software. Every effort was made to ensure accuracy; however, inadvertent computerized transcription errors may be present.   Any transcriptional errors that result from this process are unintentional.

## 2021-06-22 NOTE — Op Note (Signed)
06/22/2021  5:17 PM  PATIENT:  Mariah Schultz  65 y.o. 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   PROCEDURE:   LAPAROSCOPY DIAGNOSTIC LAPAROSCOPIC LYSIS OF ADHESIONS ANORECTAL EXAMINATION UNDER ANESTHESIA TRANSRECTAL PELVIC MASS CORE BIOPSIES DIVERTING LOOP COLOSTOMY  TRANSVERSUS ABDOMINIS PLANE (TAP) BLOCK - BILATERAL   SURGEON:  Adin Hector, MD  ASSISTANT:  Jeral Pinch, MD [place]:     General  Nerve block provided with liposomal bupivacaine (Experel) mixed with 0.25% bupivacaine as a Bilateral TAP block x 8mL each side at the level of the transverse abdominis & preperitoneal spaces along the flank at the anterior axillary line, from subcostal ridge to iliac crest under laparoscopic guidance   Local field block at port sites & extraction wound  Anorectal block  EBL:  Total I/O In: -  Out: 240 [Other:150; Blood:90]  Delay start of Pharmacological VTE agent (>24hrs) due to surgical blood loss or risk of bleeding:  no  DRAINS: NONE    SPECIMEN: Core needle biopsies through the posterior mid/distal rectum into fibrotic fixed presacral/retrorectal mass.  14-gauge Tru-Cut x5  DISPOSITION OF SPECIMEN:  PATHOLOGY  COUNTS:  YES  PLAN OF CARE: Admit to inpatient   PATIENT DISPOSITION:  PACU - hemodynamically stable.  INDICATION:    Patient with lower abdominal and pelvic discomfort with bowel changes.  CAT scan noted to  have large pelvic tumor involving the rectum and possibly the cervix.  Patient had colonoscopy with biopsies.  Mass seen to be pushing into the rectum.  Initial biopsy consistent with carcinoma and not adenocarcinoma. Colorectal and gynecological oncology consultations made along with medical and radiation oncology.   Recommendation made for gynecological and colorectal examinations with possible resection.  Probable diverting colostomy.   The anatomy & physiology of the digestive tract was discussed.  The pathophysiology was discussed.  Natural history risks without surgery was discussed.   I worked to give an overview of the disease and the frequent need to have multispecialty involvement.  I feel the risks of no intervention will lead to serious problems that outweigh the operative risks; therefore, I recommended laparoscopic exploration with biopsies, colon diversion and possible resection.  Laparoscopic & open techniques were discussed.    Risks such as bleeding, infection, abscess, leak, reoperation, possible ostomy, hernia, heart attack, death, and other risks were discussed.  I noted a good likelihood this will help address the problem.   Goals of post-operative recovery were discussed as well.  We will work to minimize complications.  Educational materials on the pathology had been given in the office.  Questions were answered.    The patient expressed understanding & wished to proceed with surgery.  OR FINDINGS:   Patient had bulky fixed spherical mass filling up most of the pelvis posteriorly.  Presacral pushing into the mesorectum.  Fixed to the sacrum.  No major obvious mucosal mass.  Lowest extent pushing at around 5 cm from the anal verge.  Perianal and anal canal region with no ulcerations mass or tumors.  No areas suspicious for AIN  nor anal SCCA.   No cancer or fistula.  Decreased but intact sphincter tone.  Normal hemorrhoids.  No fissure.  No pathology at the anus or anal  canal.  14-gauge Tru-Cut core biopsies done through the posterior rectal wall about 5-6 cm proximal to the anal verge.  No obvious metastatic disease on visceral parietal peritoneum or liver.    Rectosigmoid fixed involving most of the posterior pelvis consistent with retrorectus/mesorectal/presacral mass pushing into the rectosigmoid.  Serosa rectosigmoid not inflamed nor ulcerated.  No stellate scarring or nodularity.  No abscess.  No diverticulitis.  Distal ileum in right pelvis with 2 cm adhesion to it.  No adhesions done for concern of possible involvement.  .  Bilateral atrophic ovaries and left fallopian tube underwhelming.  Cervix underwhelming transvaginally.  Able to laparoscopically mobilize the cuff off of the rectosigmoid with soft adhesions.  Highly unlikely uterine/fallopian/ovarian etiology.  Dr. Berline Lopes with gynecological oncology concurred no carcinomatosis.    Mid descending colostomy in the left supraumbilical paramedian region.  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  Majority involving the proximal end.  DESCRIPTION:   Informed consent was confirmed.  The patient underwent general anaesthesia without difficulty.  The patient was positioned appropriately.  VTE prevention in place.  The patient's abdomen was clipped, prepped, & draped in a sterile fashion.  Surgical timeout confirmed our plan.  The patient was positioned in reverse Trendelenburg.  Abdominal entry was gained by Dr. Berline Lopes using optical entry technique in the right upper abdomen.  Entry was clean.  Induced carbon dioxide insufflation.  Camera inspection revealed no injury.  Extra ports were carefully placed under direct laparoscopic visualization.  We could see small bowel adhesions to the inferior midline consistent with her prior incision.  There is no evidence of any ascites.  No carcinomatosis.  No obvious visceral parietal nodularities concern in all 4 quadrants.  Extra ports were carefully placed.  I  did laparoscopic lysed lesions and freed the small bowel off its adhesions to the infraumbilical anterior midline visceral peritoneum.  Inspection was clean.  We came down to an obvious rockhard rectosigmoid pushing up the vaginal cuff.  We did careful lyse adhesions to mobilize the proximal sigmoid colon off the left pelvic region to expose a postmenopausal atrophic appearing left adnexa ovary and fallopian tube.  I freed the vaginal cuff off the rectosigmoid mass as it had some soft cotton candy adhesions.  No hard fibrotic adhesions concerning for tumor.  Serosa of rectosigmoid underwhelming.  Came over towards the right side to find an underwhelming right ovary as well.  Did not see much of a fallopian tube.  No evidence of fallopian tube going down the pelvis.  Neither of [place] felt it would be of much utility to do salpingo-oophorectomies on underwhelming adnexa at this time.  With this ,Dr. Delsa Sale was able to confirm that the cervix was mobile transvaginally and soft and underwhelming.  Confirmed good mobility and underwhelming vaginal cuff intraperitoneal.  No abnormalities on the bladder.  Also, according to Dr. Berline Lopes & Delsa Sale the introitus and vagina was otherwise underwhelming with no mucosal abnormalities or concerning squamous lesion there is no ulceration or tumor involving that at all.  This argues against any GYN etiology for the tumor.  I focused on anorectal examination under anesthesia.  Did this with the patient in lithotomy.  Confirmed the perirectal region was clear without any ulcerations or abnormalities.  No condyloma.  Nothing concerning for a tumor or AIN.  Sphincter  tone somewhat relaxed and decreased but intact.  No abnormalities in the anorectal canal or the rectum until I could palpate a bulky fixed spherical retrorectal mass pushing the mid rectum anteriorly and into the distal rectum.  Hard and rocky.  Correlated with a cyst concerning tumor.  It was twisted upon  itself so I cannot do a rigid proctoscopy.  I could see this region well with a Sawyer retractor to confirm no abnormalities in the anal rectal region.  Because of the atypical presentation and atypical pathology showing carcinoma, Dr. Kemper Durie and I felt patient would benefit from repeat biopsies.  I used a 14-gauge Tru-Cut core needle and went through the posterior rectal wall 5-6 cm proximal to the sphincters and passed 5 cores into the hard fibrotic mass.  Got 1 partial in 4 excellent cores.  We sent 1 core specimen for frozen.  Dr. Jaquita Folds called about 20 minutes later and feels this is a squamous cell carcinoma.  No evidence of adenocarcinoma.  I held pressure and pack to the rectum with a lap pad for pressure.  I scrubbed back in and did diagnostic laparoscopy.  Confirmed there were no concerning areas in the upper abdomen.  Ran the small bowel from the ileocecal valve proximally.  Confirmed a small knuckle of ileum less than a foot from the ileocecal region that was gone down into the pelvis.  However there was no transition point.  No perforation or obstruction.  Ran the small bowel proximally to find area of lyse adhesions and confirmed no bowel injury or other concern.  No torsion or torsion.  Because of the large bulky mass with bowel changes and colon obstruction imminent, I decided to do a diverting colostomy as we had expected.  Patient had been marked already preoperatively.  I mobilized the left colon in a lateral to medial fashion off the line of Toldt up towards the splenic flexure to ensure good mobilization of the left colon to reach into the midline and left upper quadrant planned colostomy site.  Inspection confirmed hemostasis no perforation or other areas of concern.  I created an extraction incision through circular skin incision left upper quadrant the premarked site for colostomy.  Excised a disc of skin and subcutaneous tissue.  Came through the anterior to's fascia  transversely.  Split through the rectus muscle and posterior rectus fascia/peritoneum vertically.  Placed a very small wound protector for natural retraction.  Brought up the loop of mid descending colon that came up.  Confirmed orientation.  Did well last inspection confirmed good hemostasis laparoscopically.   I removed CO2 gas out through the ports.  And removed the ports.  Dr. Berline Lopes closed the 30mm port sites using Monocryl stitch and sterile Dermabond.  Sterile towels were placed.  I matured the loop colostomy using interrupted 3-0 Vicryl suture.  Created opening such that the proximal loop involve the upper 80% of the wound in the distal and was at 6:00 inferior midline is a small mucous fistula.  Did finger intubation improved proximal and distal ends with.  No twisting or torsion.  Her abdominal wall was somewhat thick and so the colostomy was rather flat and I cannot get a great Brooke.  However was viable with good mucosa.  Colostomy appliance placed.  I returned to the perianal region and removed packing.  Confirmed good hemostasis.  Anorectal block placed.  External vaginal anatomy appears normal.  I palpate no vaginal lesions.  I agree the cervix is mobile  soft and underwhelming.  No longer fixed.  Instrument and packing counts and needle counts correct.  Patient extubated & sent to recovery room in stable condition.  She is COVID-positive but asymptomatic.  COVID precautions done perioperatively and will continue on the floor.  Dr. Berline Lopes discussed with the patient's husband operative findings.  Please see her operative note for further details.     Adin Hector, MD, FACS, MASCRS Esophageal, Gastrointestinal & Colorectal Surgery Robotic and Minimally Invasive Surgery  [facility], [place] [place], [place] [address] [phone] Fax [phone] Main  CONTACT INFORMATION:  Weekday (9AM-5PM): Call  [facility] main office at [phone]  Weeknight (5PM-9AM) or Weekend/Holiday: Check [email or web address] (password " [email or web address]") for General Surgery [facility] coverage  (Please, do not use SecureChat as it is not reliable communication to operating surgeons for immediate patient care)

## 2021-06-22 NOTE — Anesthesia Preprocedure Evaluation (Addendum)
Anesthesia Evaluation  Patient identified by MRN, date of birth, ID band Patient awake    Reviewed: Allergy & Precautions, NPO status , Patient's Chart, lab work & pertinent test results  History of Anesthesia Complications Negative for: history of anesthetic complications  Airway Mallampati: II  TM Distance: >3 FB Neck ROM: Full    Dental  (+) Missing,    Pulmonary Current Smoker,  COVID+ 06/21/21   Pulmonary exam normal        Cardiovascular hypertension, Pt. on medications Normal cardiovascular exam     Neuro/Psych negative neurological ROS  negative psych ROS   GI/Hepatic negative GI ROS, Neg liver ROS,   Endo/Other  negative endocrine ROS  Renal/GU negative Renal ROS  negative genitourinary   Musculoskeletal negative musculoskeletal ROS (+)   Abdominal   Peds  Hematology negative hematology ROS (+)   Anesthesia Other Findings Day of surgery medications reviewed with patient.  Reproductive/Obstetrics Pelvic mass                            Anesthesia Physical Anesthesia Plan  ASA: 3  Anesthesia Plan: General   Post-op Pain Management:    Induction: Intravenous  PONV Risk Score and Plan: 4 or greater and Treatment may vary due to age or medical condition, Ondansetron, Dexamethasone and Midazolam  Airway Management Planned: Oral ETT  Additional Equipment: None  Intra-op Plan:   Post-operative Plan: Extubation in OR  Informed Consent: I have reviewed the patients History and Physical, chart, labs and discussed the procedure including the risks, benefits and alternatives for the proposed anesthesia with the patient or authorized representative who has indicated his/her understanding and acceptance.     Dental advisory given  Plan Discussed with: CRNA  Anesthesia Plan Comments:        Anesthesia Quick Evaluation

## 2021-06-22 NOTE — Transfer of Care (Signed)
Immediate Anesthesia Transfer of Care Note  Patient: Mariah Schultz  Procedure(s) Performed: LAPAROSCOPY DIAGNOSTIC, DIVERTING LOOP COLOSTOMY, ANORECTAL EXAM UNDER ANESTHESIA, TRANSRECTAL CORE BIOPSIES. (Abdomen)  Patient Location: PACU  Anesthesia Type:General  Level of Consciousness: awake, alert  and oriented  Airway & Oxygen Therapy: Patient Spontanous Breathing and Patient connected to face mask  Post-op Assessment: Report given to RN and Post -op Vital signs reviewed and stable  Post vital signs: Reviewed and stable  Last Vitals:  Vitals Value Taken Time  BP 151/84 06/22/21 1730  Temp    Pulse 70 06/22/21 1734  Resp 19 06/22/21 1734  SpO2 92 % 06/22/21 1734  Vitals shown include unvalidated device data.  Last Pain:  Vitals:   06/22/21 1216  TempSrc:   PainSc: 2       Patients Stated Pain Goal: 4 (82/99/37 1696)  Complications: No notable events documented.

## 2021-06-22 NOTE — Discharge Instructions (Signed)
Ostomy Support Information  You've heard that people get along just fine with only one of their eyes, or one of their lungs, or one of their kidneys. But you also know that you have only one intestine and only one bladder, and that leaves you feeling awfully empty, both physically and emotionally: You think no other people go around without part of their intestine with the ends of their intestines sticking out through their abdominal walls.   YOU ARE NOT ALONE.  There are nearly three quarters of a million people in the [place] who have an ostomy; people who have had surgery to remove all or part of their colons or bladders.   There is even a national association, the [place] [employer] [place] with over 350 local affiliated support groups that are organized by volunteers who provide peer support and counseling. [employer] has a toll free telephone num-ber, [phone] and an educational, interactive website, www.ostomy.org   An ostomy is an opening in the belly (abdominal wall) made by surgery. Ostomates are people who have had this procedure. The opening (stoma) allows the kidney or bowel to discharge waste. An external pouch covers the stoma to collect waste. Pouches are are a simple bag and are odor free. Different companies have disposable or reusable pouches to fit one's lifestyle. An ostomy can either be temporary or permanent.   THERE ARE THREE MAIN TYPES OF OSTOMIES Colostomy. A colostomy is a surgically created opening in the large intestine (colon). Ileostomy. An ileostomy is a surgically created opening in the small intestine. Urostomy. A urostomy is a surgically created opening to divert urine away from the bladder.  OSTOMY Care  The following guidelines will make care of your colostomy easier. Keep this information close by for quick reference.  Helpful DIET hints Eat a well-balanced diet including vegetables and fresh fruits. Eat on a regular schedule.  Drink at least 6 to 8  glasses of fluids daily. Eat slowly in a relaxed atmosphere. Chew your food thoroughly. Avoid chewing gum, smoking, and drinking from a straw. This will help decrease the amount of air you swallow, which may help reduce gas. Eating yogurt or drinking buttermilk may help reduce gas.  To control gas at night, do not eat after 8 p.m. This will give your bowel time to quiet down before you go to bed.  If gas is a problem, you can purchase Beano. Sprinkle Beano on the first bite of food before eating to reduce gas. It has no flavor and should not change the taste of your food. You can buy Beano over the counter at your local drugstore.  Foods like fish, onions, garlic, broccoli, asparagus, and cabbage produce odor. Although your pouch is odor-proof, if you eat these foods you may notice a stronger odor when emptying your pouch. If this is a concern, you may want to limit these foods in your diet.  If you have an ileostomy, you will have chronic diarrhea & need to drink more liquids to avoid getting dehydrated.  Consider antidiarrheal medicine like imodium (loperamide) or Lomotil to help slow down bowel movements / diarrhea into your ileostomy bag.  GETTING TO GOOD BOWEL HEALTH WITH AN ILEOSTOMY    With the colon bypassed & not in use, you will have small bowel diarrhea.   It is important to thicken & slow your bowel movements down.   The goal: 4-6 small BOWEL MOVEMENTS A DAY It is important to drink plenty of liquids to avoid getting dehydrated  CONTROLLING ILEOSTOMY DIARRHEA  TAKE A FIBER SUPPLEMENT (FiberCon or Benefiner soluble fiber) twice a day - to thicken stools by absorbing excess fluid and retrain the intestines to act more normally.  Slowly increase the dose over a few weeks.  Too much fiber too soon can backfire and cause cramping & bloating.  TAKE AN IRON SUPPLEMENT twice a day to naturally constipate your bowels.  Usually ferrous sulfate 371m twice a day)  TAKE ANTI-DIARRHEAL  MEDICINES: Loperamide (Imodium) can slow down diarrhea.  Start with two tablets (= 449m first and then try one tablet every 6 hours.  Can go up to 2 pills four times day (8 pills of 75m90max) Avoid if you are having fevers or severe pain.  If you are not better or start feeling worse, stop all medicines and call your doctor for advice LoMotil (Diphenoxylate / Atropine) is another medicine that can constipate & slow down bowel moevements Pepto Bismol (bismuth) can gently thicken bowels as well  If diarrhea is worse,: drink plenty of liquids and try simpler foods for a few days to avoid stressing your intestines further. Avoid dairy products (especially milk & ice cream) for a short time.  The intestines often can lose the ability to digest lactose when stressed. Avoid foods that cause gassiness or bloating.  Typical foods include beans and other legumes, cabbage, broccoli, and dairy foods.  Every person has some sensitivity to other foods, so listen to our body and avoid those foods that trigger problems for you.Call your doctor if you are getting worse or not better.  Sometimes further testing (cultures, endoscopy, X-ray studies, bloodwork, etc) may be needed to help diagnose and treat the cause of the diarrhea. Take extra anti-diarrheal medicines (maximum is 8 pills of 75mg59mperamide a day)   Tips for POUCHING an OSTOMY   Changing Your Pouch The best time to change your pouch is in the morning, before eating or drinking anything. Your stoma can function at any time, but it will function more after eating or drinking.   Applying the pouching system  Place all your equipment close at hand before removing your pouch.  Wash your hands.  Stand or sit in front of a mirror. Use the position that works best for you. Remember that you must keep the skin around the stoma wrinkle-free for a good seal.  Gently remove the used pouch (1-piece system) or the pouch and old wafer (2-piece system). Empty  the pouch into the toilet. Save the closure clip to use again.  Wash the stoma itself and the skin around the stoma. Your stoma may bleed a little when being washed. This is normal. Rinse and pat dry. You may use a wash cloth or soft paper towels (like Bounty), mild soap (like Dial, Safeguard, or IvorMongoliand water. Avoid soaps that contain perfumes or lotions.  For a new pouch (1-piece system) or a new wafer (2-piece system), measure your stoma using the stoma guide in each box of supplies.  Trace the shape of your stoma onto the back of the new pouch or the back of the new wafer. Cut out the opening. Remove the paper backing and set it aside.  Optional: Apply a skin barrier powder to surrounding skin if it is irritated (bare or weeping), and dust off the excess. Optional: Apply a skin-prep wipe (such as Skin Prep or All-Kare) to the skin around the stoma, and let it dry. Do not apply this solution if the skin is irritated (  red, tender, or broken) or if you have shaved around the stoma. Optional: Apply a skin barrier paste (such as Stomahesive, Coloplast, or Premium) around the opening cut in the back of the pouch or wafer. Allow it to dry for 30 to 60 seconds.  Hold the pouch (1-piece system) or wafer (2-piece system) with the sticky side toward your body. Make sure the skin around the stoma is wrinkle-free. Center the opening on the stoma, then press firmly to your abdomen (Fig. 4). Look in the mirror to check if you are placing the pouch, or wafer, in the right position. For a 2-piece system, snap the pouch onto the wafer. Make sure it snaps into place securely.  Place your hand over the stoma and the pouch or wafer for about 30 seconds. The heat from your hand can help the pouch or wafer stick to your skin.  Add deodorant (such as Super Banish or Nullo) to your pouch. Other options include food extracts such as vanilla oil and peppermint extract. Add about 10 drops of the deodorant to the pouch.  Then apply the closure clamp. Note: Do not use toxic  chemicals or commercial cleaning agents in your pouch. These substances may harm the stoma.  Optional: For extra seal, apply tape to all 4 sides around the pouch or wafer, as if you were framing a picture. You may use any brand of medical adhesive tape. Change your pouch every 5 to 7 days. Change it immediately if a leak occurs.  Wash your hands afterwards.  If you are wearing a 2-piece system, you may use 2 new pouches per week and alternate them. Rinse the pouch with mild soap and warm water and hang it to dry for the next day. Apply the fresh pouch. Alternate the 2 pouches like this for a week. After a week, change the wafer and begin with 2 new pouches. Place the old pouches in a plastic bag, and put them in the trash.   LIVING WITH AN OSTOMY  Emptying Your Pouch Empty your pouch when it is one-third full (of urine, stool, and/or gas). If you wait until your pouch is fuller than this, it will be more difficult to empty and more noticeable. When you empty your pouch, either put toilet paper in the toilet bowl first, or flush the toilet while you empty the pouch. This will reduce splashing. You can empty the pouch between your legs or to one side while sitting, or while standing or stooping. If you have a 2-piece system, you can snap off the pouch to empty it. Remember that your stoma may function during this time. If you wish to rinse your pouch after you empty it, a [place] baster can be helpful. When using a baster, squirt water up into the pouch through the opening at the bottom. With a 2-piece system, you can snap off the pouch to rinse it. After rinsing  your pouch, empty it into the toilet. When rinsing your pouch at home, put a few granules of Dreft soap in the rinse water. This helps lubricate and freshen your pouch. The inside of your pouch can be sprayed with non-stick cooking oil (Pam spray). This may help reduce stool sticking to  the inside of the pouch.  Bathing You may shower or bathe with your pouch on or off. Remember that your stoma may function during this time.  The materials you use to wash your stoma and the skin around it should be clean, but they  do not need to be sterile.  Wearing Your Pouch During hot weather, or if you perspire a lot in general, wear a cover over your pouch. This may prevent a rash on your skin under the pouch. Pouch covers are sold at ostomy supply stores. Wear the pouch inside your underwear for better support. Watch your weight. Any gain or loss of 10 to 15 pounds or more can change the way your pouch fits.  Going Away From Home A collapsible cup (like those that come in travel kits) or a soft plastic squirt bottle with a pull-up top (like a travel bottle for shampoo) can be used for rinsing your pouch when you are away from home. Tilt the opening of the pouch at an upward angle when using a cup to rinse.  Carry wet wipes or extra tissues to use in public bathrooms.  Carry an extra pouching system with you at all times.  Never keep ostomy supplies in the glove compartment of your car. Extreme heat or cold can damage the skin barriers and adhesive wafers on the pouch.  When you travel, carry your ostomy supplies with you at all times. Keep them within easy reach. Do not pack ostomy supplies in baggage that will be checked or otherwise separated from you, because your baggage might be lost. If you're traveling out of the country, it is helpful to have a letter stating that you are carrying ostomy supplies as a medical necessity.  If you need ostomy supplies while traveling, look in the yellow pages of the telephone book under "Surgical Supplies." Or call the local ostomy organization to find out where supplies are available.  Do not let your ostomy supplies get low. Always order new pouches before you use the last one.  Reducing Odor Limit foods such as broccoli, cabbage, onions,  fish, and garlic in your diet to help reduce odor. Each time you empty your pouch, carefully clean the opening of the pouch, both inside and outside, with toilet paper. Rinse your pouch 1 or 2 times daily after you empty it (see directions for emptying your pouch and going away from home). Add deodorant (such as Super Banish or Nullo) to your pouch. Use air deodorizers in your bathroom. Do not add aspirin to your pouch. Even though aspirin can help prevent odor, it could cause ulcers on your stoma.  When to call the doctor Call the doctor if you have any of the following symptoms: Purple, black, or white stoma Severe cramps lasting more than 6 hours Severe watery discharge from the stoma lasting more than 6 hours No output from the colostomy for 3 days Excessive bleeding from your stoma Swelling of your stoma to more than 1/2-inch larger than usual Pulling inward of your stoma below skin level Severe skin irritation or deep ulcers Bulging or other changes in your abdomen  When to call your ostomy nurse Call your ostomy/enterostomal therapy (WOCN) nurse if any of the following occurs: Frequent leaking of your pouching system Change in size or appearance of your stoma, causing discomfort or problems with your pouch Skin rash or rawness Weight gain or loss that causes problems with your pouch     FREQUENTLY ASKED QUESTIONS   Why haven't you met any of these folks who have an ostomy?  Well, maybe you have! You just did not recognize them because an ostomy doesn't show. It can be kept secret if you wish. Why, maybe some of your best friends, office associates or neighbors  have an ostomy ... you never can tell. People facing ostomy surgery have many quality-of-life questions like: Will you bulge? Smell? Make noises? Will you feel waste leaving your body? Will you be a captive of the toilet? Will you starve? Be a social outcast? Get/stay married? Have babies? Easily bathe, go swimming, bend  over?  OK, let's look at what you can expect:   Will you bulge?  Remember, without part of the intestine or bladder, and its contents, you should have a flatter tummy than before. You can expect to wear, with little exception, what you wore before surgery ... and this in-cludes tight clothing and bathing suits.   Will you smell?  Today, thanks to modern odor proof pouching systems, you can walk into an ostomy support group meeting and not smell anything that is foul or offensive. And, for those with an ileostomy or colostomy who are concerned about odor when emptying their pouch, there are in-pouch deodorants that can be used to eliminate any waste odors that may exist.   Will you make noises?  Everyone produces gas, especially if they are an air-swallower. But intestinal sounds that occur from time to time are no differ-ent than a gurgling tummy, and quite often your clothing will muffle any sounds.   Will you feel the waste discharges?  For those with a colostomy or ileostomy there might be a slight pressure when waste leaves your body, but understand that the intestines have no nerve endings, so there will be no unpleasant sensations. Those with a urostomy will probably be unaware of any kidney drainage.   Will you be a captive of the toilet?  Immediately post-op you will spend more time in the bathroom than you will after your body recovers from surgery. Every person is different, but on average those with an ileostomy or urostomy may empty their pouches 4 to 6 times a day; a little  less if you have a colostomy. The average wear time between pouch system changes is 3 to 5 days and the changing process should take less than 30 minutes.   Will I need to be on a special diet? Most people return to their normal diet when they have recovered from surgery. Be sure to chew your food well, eat a well-balanced diet and drink plenty of fluids. If you experience problems with a certain food, wait a  couple of weeks and try it again.  Will there be odor and noises? Pouching systems are designed to be odor-proof or odor-resistant. There are deodorants that can be used in the pouch. Medications are also available to help reduce odor. Limit gas-producing foods and carbonated beverages. You will experience less gas and fewer noises as you heal from surgery.  How much time will it take to care for my ostomy? At first, you may spend a lot of time learning about your ostomy and how to take care of it. As you become more comfortable and skilled at changing the pouching system, it will take very little time to care for it.   Will I be able to return to work? People with ostomies can perform most jobs. As soon as you have healed from surgery, you should be able to return to work. Heavy lifting (more than 10 pounds) may be discouraged.   What about intimacy? Sexual relationships and intimacy are important and fulfilling aspects of your life. They should continue after ostomy surgery. Intimacy-related concerns should be discussed openly between you and your partner.  Can I wear regular clothing? You do not need to wear special clothing. Ostomy pouches are fairly flat and barely noticeable. Elastic undergarments will not hurt the stoma or prevent the ostomy from functioning.   Can I participate in sports? An ostomy should not limit your involvement in sports. Many people with ostomies are runners, skiers, swimmers or participate in other active lifestyles. Talk with your caregiver first before doing heavy physical activity.  Will you starve?  Not if you follow doctor's orders at each stage of your post-op adjustment. There is no such thing as an "ostomy diet". Some people with an ostomy will be able to eat and tolerate anything; others may find diffi-culty with some foods. Each person is an individual and must determine, by trial, what is best for them. A good practice for all is to drink plenty of  water.   Will you be a social outcast?  Have you met anyone who has an ostomy and is a social outcast? Why should you be the first? Only your attitude and self image will effect how you are treated. No confi-dent person is an Occupational psychologist.    PROFESSIONAL HELP   Resources are available if you need help or have questions about your ostomy.   Specially trained nurses called Wound, Ostomy Continence Nurses (WOCN) are available for consultation in most major medical centers.  Consider getting an ostomy consult at one of the outpatient ostomy clinics.  Your surgeon office or wound ostomy nurse can help set it up  The [employer] Association (UOA) is a group made up of many local chapters throughout the [place]. These local groups hold meetings and provide support to prospective and existing ostomates. They sponsor educational events and have qualified visitors to make personal or telephone visits. Contact the UOA for the chapter nearest you and for other educational publications.  More detailed information can be found in Colostomy Guide, a publication of the [employer] (UOA). Contact UOA at 1-(458)596-0137 or visit their web site at https://arellano.com/. The website contains links to other sites, suppliers and resources.  Tree surgeon Start Services: Start at the website to enlist for support.  Your Wound Ostomy (WOCN) nurse may have started this process. https://www.hollister.com/en/securestart Secure Start services are designed to support people as they live their lives with an ostomy or neurogenic bladder. Enrolling is easy and at no cost to the patient. We realize that each person's needs and life journey are different. Through Secure Start services, we want to help people live their life, their way.   ######################################  SURGERY: POST OP INSTRUCTIONS (Surgery for small bowel obstruction, colon resection,  etc)   ######################################################################  EAT Gradually transition to a high fiber diet with a fiber supplement over the next few days after discharge  WALK Walk an hour a day.  Control your pain to do that.    CONTROL PAIN Control pain so that you can walk, sleep, tolerate sneezing/coughing, go up/down stairs.  HAVE A BOWEL MOVEMENT DAILY Keep your bowels regular to avoid problems.  OK to try a laxative to override constipation.  OK to use an antidairrheal to slow down diarrhea.  Call if not better after 2 tries  CALL IF YOU HAVE PROBLEMS/CONCERNS Call if you are still struggling despite following these instructions. Call if you have concerns not answered by these instructions  ######################################################################   DIET Follow a light diet the first few days at home.  Start with a bland diet such as soups, liquids,  starchy foods, low fat foods, etc.  If you feel full, bloated, or constipated, stay on a ful liquid or pureed/blenderized diet for a few days until you feel better and no longer constipated. Be sure to drink plenty of fluids every day to avoid getting dehydrated (feeling dizzy, not urinating, etc.). Gradually add a fiber supplement to your diet over the next week.  Gradually get back to a regular solid diet.  Avoid fast food or heavy meals the first week as you are more likely to get nauseated. It is expected for your digestive tract to need a few months to get back to normal.  It is common for your bowel movements and stools to be irregular.  You will have occasional bloating and cramping that should eventually fade away.  Until you are eating solid food normally, off all pain medications, and back to regular activities; your bowels will not be normal. Focus on eating a low-fat, high fiber diet the rest of your life (See Getting to [place], below).  CARE of your INCISION or WOUND It is good  for closed incision and even open wounds to be washed every day.  Shower every day.  Short baths are fine.  Wash the incisions and wounds clean with soap & water.     If you have a closed incision(s), wash the incision with soap & water every day.  You may leave closed incisions open to air if it is dry.   You may cover the incision with clean gauze & replace it after your daily shower for comfort.  It is good for closed incisions and even open wounds to be washed every day.  Shower every day.  Short baths are fine.  Wash the incisions and wounds clean with soap & water.    You may leave closed incisions open to air if it is dry.   You may cover the incision with clean gauze & replace it after your daily shower for comfort.  DERMABOND:  You have purple skin glue (Dermabond) on your incision(s).  Leave them in place, and they will fall off on their own like a scab in 2-3 weeks.  You may trim any edges that curl up with clean scissors.   If you have an open wound with a wound vac, see wound vac care instructions.     ACTIVITIES as tolerated Start light daily activities --- self-care, walking, climbing stairs-- beginning the day after surgery.  Gradually increase activities as tolerated.  Control your pain to be active.  Stop when you are tired.  Ideally, walk several times a day, eventually an hour a day.   Most people are back to most day-to-day activities in a few weeks.  It takes 4-8 weeks to get back to unrestricted, intense activity. If you can walk 30 minutes without difficulty, it is safe to try more intense activity such as jogging, treadmill, bicycling, low-impact aerobics, swimming, etc. Save the most intensive and strenuous activity for last (Usually 4-8 weeks after surgery) such as sit-ups, heavy lifting, contact sports, etc.  Refrain from any intense heavy lifting or straining until you are off narcotics for pain control.  You will have off days, but things should improve  week-by-week. DO NOT PUSH THROUGH PAIN.  Let pain be your guide: If it hurts to do something, don't do it.  Pain is your body warning you to avoid that activity for another week until the pain goes down. You may drive when you are  no longer taking narcotic prescription pain medication, you can comfortably wear a seatbelt, and you can safely make sudden turns/stops to protect yourself without hesitating due to pain. You may have sexual intercourse when it is comfortable. If it hurts to do something, stop.  MEDICATIONS Take your usually prescribed home medications unless otherwise directed.   Blood thinners:  Usually you can restart any strong blood thinners after the second postoperative day.  It is OK to take aspirin right away.     If you are on strong blood thinners (warfarin/Coumadin, Plavix, Xerelto, Eliquis, Pradaxa, etc), discuss with your surgeon, medicine PCP, and/or cardiologist for instructions on when to restart the blood thinner & if blood monitoring is needed (PT/INR blood check, etc).     PAIN CONTROL Pain after surgery or related to activity is often due to strain/injury to muscle, tendon, nerves and/or incisions.  This pain is usually short-term and will improve in a few months.  To help speed the process of healing and to get back to regular activity more quickly, DO THE FOLLOWING THINGS TOGETHER: Increase activity gradually.  DO NOT PUSH THROUGH PAIN Use Ice and/or Heat Try Gentle Massage and/or Stretching Take over the counter pain medication Take Narcotic prescription pain medication for more severe pain  Good pain control = faster recovery.  It is better to take more medicine to be more active than to stay in bed all day to avoid medications.  Increase activity gradually Avoid heavy lifting at first, then increase to lifting as tolerated over the next 6 weeks. Do not "push through" the pain.  Listen to your body and avoid positions and maneuvers than reproduce the pain.   Wait a few days before trying something more intense Walking an hour a day is encouraged to help your body recover faster and more safely.  Start slowly and stop when getting sore.  If you can walk 30 minutes without stopping or pain, you can try more intense activity (running, jogging, aerobics, cycling, swimming, treadmill, sex, sports, weightlifting, etc.) Remember: If it hurts to do it, then don't do it! Use Ice and/or Heat You will have swelling and bruising around the incisions.  This will take several weeks to resolve. Ice packs or heating pads (6-8 times a day, 30-60 minutes at a time) will help sooth soreness & bruising. Some people prefer to use ice alone, heat alone, or alternate between ice & heat.  Experiment and see what works best for you.  Consider trying ice for the first few days to help decrease swelling and bruising; then, switch to heat to help relax sore spots and speed recovery. Shower every day.  Short baths are fine.  It feels good!  Keep the incisions and wounds clean with soap & water.   Try Gentle Massage and/or Stretching Massage at the area of pain many times a day Stop if you feel pain - do not overdo it Take over the counter pain medication This helps the muscle and nerve tissues become less irritable and calm down faster Choose ONE of the following over-the-counter anti-inflammatory medications: Acetaminophen 500mg  tabs (Tylenol) 1-2 pills with every meal and just before bedtime (avoid if you have liver problems or if you have acetaminophen in you narcotic prescription) Naproxen 220mg  tabs (ex. Aleve, Naprosyn) 1-2 pills twice a day (avoid if you have kidney, stomach, IBD, or bleeding problems) Ibuprofen 200mg  tabs (ex. Advil, Motrin) 3-4 pills with every meal and just before bedtime (avoid if you have kidney, stomach,  IBD, or bleeding problems) Take with food/snack several times a day as directed for at least 2 weeks to help keep pain / soreness down & more  manageable. Take Narcotic prescription pain medication for more severe pain A prescription for strong pain control is often given to you upon discharge (for example: oxycodone/Percocet, hydrocodone/Norco/Vicodin, or tramadol/Ultram) Take your pain medication as prescribed. Be mindful that most narcotic prescriptions contain Tylenol (acetaminophen) as well - avoid taking too much Tylenol. If you are having problems/concerns with the prescription medicine (does not control pain, nausea, vomiting, rash, itching, etc.), please call us [phone] to see if we need to switch you to a different pain medicine that will work better for you and/or control your side effects better. If you need a refill on your pain medication, you must call the office before 4 pm and on weekdays only.  By federal law, prescriptions for narcotics cannot be called into a pharmacy.  They must be filled out on paper & picked up from our office by the patient or authorized caretaker.  Prescriptions cannot be filled after 4 pm nor on weekends.    WHEN TO CALL us [phone] Severe uncontrolled or worsening pain  Fever over 101 F (38.5 C) Concerns with the incision: Worsening pain, redness, rash/hives, swelling, bleeding, or drainage Reactions / problems with new medications (itching, rash, hives, nausea, etc.) Nausea and/or vomiting Difficulty urinating Difficulty breathing Worsening fatigue, dizziness, lightheadedness, blurred vision Other concerns If you are not getting better after two weeks or are noticing you are getting worse, contact our office [phone] for further advice.  We may need to adjust your medications, re-evaluate you in the office, send you to the emergency room, or see what other things we can do to help. The clinic staff is available to answer your questions during regular business hours (8:30am-5pm).  Please don't hesitate to call and ask to speak to one of our nurses for clinical concerns.    A  surgeon from [facility] is always on call at the hospitals 24 hours/day If you have a medical emergency, go to the nearest emergency room or call 911.  FOLLOW UP in our office One the day of your discharge from the hospital (or the next business weekday), please call [facility] to set up or confirm an appointment to see your surgeon in the office for a follow-up appointment.  Usually it is 2-3 weeks after your surgery.   If you have skin staples at your incision(s), let the office know so we can set up a time in the office for the nurse to remove them (usually around 10 days after surgery). Make sure that you call for appointments the day of discharge (or the next business weekday) from the hospital to ensure a convenient appointment time. IF YOU HAVE DISABILITY OR FAMILY LEAVE FORMS, BRING THEM TO THE OFFICE FOR PROCESSING.  DO NOT GIVE THEM TO YOUR DOCTOR.  [facility] [address], [place], [place], [place]  [address] ? [phone] - Main [phone] - [place],  [phone] - Fax [email or web address]  GETTING TO GOOD BOWEL HEALTH. It is expected for your digestive tract to need a few months to get back to normal.  It is common for your bowel movements and stools to be irregular.  You will have occasional bloating and cramping that should eventually fade away.  Until you are eating solid food normally, off all pain medications, and back  to regular activities; your bowels will not be normal.   Avoiding constipation The goal: ONE SOFT BOWEL MOVEMENT A DAY!    Drink plenty of fluids.  Choose water first. TAKE A FIBER SUPPLEMENT EVERY DAY THE REST OF YOUR LIFE During your first week back home, gradually add back a fiber supplement every day Experiment which form you can tolerate.   There are many forms such as powders, tablets, wafers, gummies, etc Psyllium bran (Metamucil), methylcellulose (Citrucel), Miralax or Glycolax,  Benefiber, Flax Seed.  Adjust the dose week-by-week (1/2 dose/day to 6 doses a day) until you are moving your bowels 1-2 times a day.  Cut back the dose or try a different fiber product if it is giving you problems such as diarrhea or bloating. Sometimes a laxative is needed to help jump-start bowels if constipated until the fiber supplement can help regulate your bowels.  If you are tolerating eating & you are farting, it is okay to try a gentle laxative such as double dose MiraLax, prune juice, or Milk of Magnesia.  Avoid using laxatives too often. Stool softeners can sometimes help counteract the constipating effects of narcotic pain medicines.  It can also cause diarrhea, so avoid using for too long. If you are still constipated despite taking fiber daily, eating solids, and a few doses of laxatives, call our office. Controlling diarrhea Try drinking liquids and eating bland foods for a few days to avoid stressing your intestines further. Avoid dairy products (especially milk & ice cream) for a short time.  The intestines often can lose the ability to digest lactose when stressed. Avoid foods that cause gassiness or bloating.  Typical foods include beans and other legumes, cabbage, broccoli, and dairy foods.  Avoid greasy, spicy, fast foods.  Every person has some sensitivity to other foods, so listen to your body and avoid those foods that trigger problems for you. Probiotics (such as active yogurt, Align, etc) may help repopulate the intestines and colon with normal bacteria and calm down a sensitive digestive tract Adding a fiber supplement gradually can help thicken stools by absorbing excess fluid and retrain the intestines to act more normally.  Slowly increase the dose over a few weeks.  Too much fiber too soon can backfire and cause cramping & bloating. It is okay to try and slow down diarrhea with a few doses of antidiarrheal medicines.   Bismuth subsalicylate (ex. Kayopectate, Pepto Bismol)  for a few doses can help control diarrhea.  Avoid if pregnant.   Loperamide (Imodium) can slow down diarrhea.  Start with one tablet ($RemoveBef'2mg'wsiBzXtssa$ ) first.  Avoid if you are having fevers or severe pain.  ILEOSTOMY PATIENTS WILL HAVE CHRONIC DIARRHEA since their colon is not in use.    Drink plenty of liquids.  You will need to drink even more glasses of water/liquid a day to avoid getting dehydrated. Record output from your ileostomy.  Expect to empty the bag every 3-4 hours at first.  Most people with a permanent ileostomy empty their bag 4-6 times at the least.   Use antidiarrheal medicine (especially Imodium) several times a day to avoid getting dehydrated.  Start with a dose at bedtime & breakfast.  Adjust up or down as needed.  Increase antidiarrheal medications as directed to avoid emptying the bag more than 8 times a day (every 3 hours). Work with your wound ostomy nurse to learn care for your ostomy.  See ostomy care instructions. TROUBLESHOOTING IRREGULAR BOWELS 1) Start with a soft &  bland diet. No spicy, greasy, or fried foods.  2) Avoid gluten/wheat or dairy products from diet to see if symptoms improve. 3) Miralax 17gm or flax seed mixed in [place]. water or juice-daily. May use 2-4 times a day as needed. 4) Gas-X, Phazyme, etc. as needed for gas & bloating.  5) Prilosec (omeprazole) over-the-counter as needed 6)  Consider probiotics (Align, Activa, etc) to help calm the bowels down  Call your doctor if you are getting worse or not getting better.  Sometimes further testing (cultures, endoscopy, X-ray studies, CT scans, bloodwork, etc.) may be needed to help diagnose and treat the cause of the diarrhea. [facility], [place], [place], [place], [place]  [address] [phone] - Main.    [phone]  - Toll Free.   [phone] - Fax [email or web address]

## 2021-06-22 NOTE — Anesthesia Procedure Notes (Signed)
Procedure Name: Intubation Date/Time: 06/22/2021 2:58 PM Performed by: Eben Burow, CRNA Pre-anesthesia Checklist: Patient identified, Emergency Drugs available, Suction available, Patient being monitored and Timeout performed Patient Re-evaluated:Patient Re-evaluated prior to induction Oxygen Delivery Method: Circle system utilized Preoxygenation: Pre-oxygenation with 100% oxygen Induction Type: IV induction Ventilation: Mask ventilation without difficulty Laryngoscope Size: Mac and 4 Grade View: Grade I Tube type: Oral Tube size: 7.0 mm Number of attempts: 1 Airway Equipment and Method: Stylet Placement Confirmation: ETT inserted through vocal cords under direct vision, positive ETCO2 and breath sounds checked- equal and bilateral Secured at: 21 cm Tube secured with: Tape Dental Injury: Teeth and Oropharynx as per pre-operative assessment

## 2021-06-22 NOTE — Interval H&P Note (Signed)
History and Physical Interval Note:  06/22/2021 1:19 PM  Mariah Schultz  has presented today for surgery, with the diagnosis of PELVIC MASS.  The various methods of treatment have been discussed with the patient and family. After consideration of risks, benefits and other options for treatment, the patient has consented to  Procedure(s): LAPAROSCOPY DIAGNOSTIC (N/A) POSSIBLE ROBOTIC VERSUS EXPLORATORY LAPAROTOMY POSSIBLE DEBULKING, POSSIBLE BIOSPIES (N/A) as a surgical intervention.  The patient's history has been reviewed, patient examined, no change in status, stable for surgery.  I have reviewed the patient's chart and labs.  Questions were answered to the patient's satisfaction.     I have re-reviewed the the patient's records, history, medications, and allergies.  I have re-examined the patient.  I again discussed intraoperative plans and goals of post-operative recovery.  The patient agrees to proceed.  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   Papilloma of left breast 02/26/2019   Left shoulder pain 09/11/2013   Hypertension    CAP (community acquired pneumonia) 09/10/2013   Pneumonia 09/10/2013   Perforated appendicitis 03/19/2013    Past Medical History:  Diagnosis Date   Hypertension    Papilloma of left breast    Papilloma of left breast 02/26/2019    Past Surgical History:  Procedure Laterality Date   ABDOMINAL  HYSTERECTOMY     BREAST EXCISIONAL BIOPSY Left 02/26/2019   B9 Biopsy   BREAST LUMPECTOMY WITH RADIOACTIVE SEED LOCALIZATION Left 02/26/2019   Procedure: LEFT BREAST LUMPECTOMY WITH RADIOACTIVE SEED LOCALIZATION;  Surgeon: Fanny Skates, MD;  Location: [facility];  Service: General;  Laterality: Left;   LAPAROSCOPIC APPENDECTOMY N/A 02/25/2013   Procedure: APPENDECTOMY LAPAROSCOPIC;  Surgeon: Harl Bowie, MD;  Location: [facility];  Service: General;  Laterality: N/A;    Social History   Socioeconomic History   Marital status: Married    Spouse name: Not on file   Number of children: Not on file   Years of education: Not on file   Highest education level: Not on file  Occupational History   Not on file  Tobacco Use   Smoking status: Every Day    Packs/day: 1.00    Years: 40.00    Pack years: 40.00    Types: Cigarettes   Smokeless tobacco: Never  Vaping Use   Vaping Use: Never used  Substance and Sexual Activity   Alcohol use: Yes    Comment: occ   Drug use: No   Sexual activity: Not Currently    Birth control/protection: Surgical  Other Topics Concern   Not on file  Social History Narrative   Not on file   Social Determinants of Health   Financial Resource Strain: Low Risk    Difficulty of Paying Living Expenses: Not very hard  Food Insecurity: No Food Insecurity   Worried About Running Out of Food in the Last Year: Never true   Ran Out of Food in the Last Year: Never true  Transportation Needs: No Transportation Needs   Lack of  Transportation (Medical): No   Lack of Transportation (Non-Medical): No  Physical Activity: Not on file  Stress: Not on file  Social Connections: Not on file  Intimate Partner Violence: Not on file    Family History  Problem Relation Age of Onset   Heart disease Father    Colon cancer Neg Hx    Breast cancer Neg Hx    Ovarian cancer Neg Hx    Pancreatic cancer Neg Hx    Prostate cancer Neg Hx    Endometrial  cancer Neg Hx        Patient unaware    Medications Prior to Admission  Medication Sig Dispense Refill Last Dose   acetaminophen (TYLENOL) 500 MG tablet Take 500 mg by mouth every 6 (six) hours as needed.   06/22/2021 at 0700   aspirin 81 MG tablet Take 81 mg by mouth daily.   06/21/2021   Multiple Vitamin (MULTIVITAMIN) tablet Take 1 tablet by mouth daily.   06/21/2021   Omega-3 Fatty Acids (FISH OIL PO) Take 1 capsule by mouth daily.   06/21/2021   traMADol (ULTRAM) 50 MG tablet Take 1 tablet (50 mg total) by mouth every 8 (eight) hours as needed. (Patient taking differently: Take 50 mg by mouth every 8 (eight) hours as needed (pain).) 30 tablet 0 Past Week   valsartan-hydrochlorothiazide (DIOVAN-HCT) 160-12.5 MG tablet Take 1 tablet by mouth daily.   06/21/2021   vitamin C (ASCORBIC ACID) 500 MG tablet Take 500 mg by mouth daily.   06/21/2021   albuterol (PROVENTIL HFA;VENTOLIN HFA) 108 (90 BASE) MCG/ACT inhaler Inhale 2 puffs into the lungs every 6 (six) hours as needed for wheezing or shortness of breath.   More than a month    Current Facility-Administered Medications  Medication Dose Route Frequency Provider Last Rate Last Admin   acetaminophen (TYLENOL) tablet 1,000 mg  1,000 mg Oral On Call to OR Michael Boston, MD       bupivacaine liposome (EXPAREL) 1.3 % injection 266 mg  20 mL Infiltration Once Michael Boston, MD       cefoTEtan (CEFOTAN) 2 g in sodium chloride 0.9 % 100 mL IVPB  2 g Intravenous On Call to OR Michael Boston, MD       Chlorhexidine Gluconate Cloth 2 % PADS 6 each  6 each Topical Once Michael Boston, MD       feeding supplement (ENSURE PRE-SURGERY) liquid 592 mL  592 mL Oral Once Michael Boston, MD       fentaNYL (SUBLIMAZE) injection 25-50 mcg  25-50 mcg Intravenous Q5 min PRN Brennan Bailey, MD       lactated ringers infusion   Intravenous Continuous Annye Asa, MD 10 mL/hr at 06/22/21 1302 Continued from Pre-op at 06/22/21 1302   oxyCODONE (Oxy  IR/ROXICODONE) immediate release tablet 5 mg  5 mg Oral Once PRN Brennan Bailey, MD       Or   oxyCODONE (ROXICODONE) 5 MG/5ML solution 5 mg  5 mg Oral Once PRN Brennan Bailey, MD       povidone-iodine 10 % swab 2 application  2 application Topical Once Cross, Melissa D, NP       promethazine (PHENERGAN) injection 6.25 mg  6.25 mg Intravenous Q15 min PRN Brennan Bailey, MD         Allergies  Allergen Reactions   Other Swelling    pneumonia vaccine    BP 140/84   Pulse 84   Temp 98.4 F (36.9 C) (  Oral)   Resp 16   Ht 5\' 4"  (1.626 m)   Wt 76.2 kg   SpO2 97%   BMI 28.84 kg/m   Labs: Results for orders placed or performed in visit on 06/21/21 (from the past 48 hour(s))  SARS Coronavirus 2 (TAT 6-24 hrs)     Status: Abnormal   Collection Time: 06/21/21 12:00 AM  Result Value Ref Range   SARS Coronavirus 2 RESULT: POSITIVE (A)     Comment: RESULT: POSITIVESARS-CoV-2 INTERPRETATION:A POSITIVE  test result means that SARS-CoV-2 RNA was present in the specimen above the limit of detection of this test. The presence of SARS-CoV-2 RNA is indicative of infection with SARS-CoV-2. Detection of  SARS-CoV-2 RNA may not rule-out bacterial infection or co-infection with other viruses. Positive and negative predictive values of testing are highly dependent on prevalence. 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 Cervical carcinoma is suspected clinically. The patient has undergone a  supracervical hysterectomy, however the cervix is well seen and the tumor abuts its posterior wall, but does not appear to arise from, or definitely invade into, the cervix. This bulky tumor is centered squarely in the midline posterior pelvis, involving the rectum and cul-de-sac, and could be of peritoneal or tubovarian origin. This was discussed with Dr. Johney Maine by telephone on 06/18/2021. Electronically Signed   By: Marlaine Hind M.D.   On: 06/18/2021 08:32   Result Date: 06/18/2021 CLINICAL DATA:  Rectal carcinoma. EXAM: MRI PELVIS WITHOUT CONTRAST TECHNIQUE: Multiplanar multisequence MR imaging of the pelvis was performed. No intravenous contrast was administered. Ultrasound gel was administered per rectum to optimize tumor evaluation. COMPARISON:  CT on 05/11/2021 FINDINGS: TUMOR LOCATION Tumor distance from Anal Verge/Skin Surface:  8.4 cm Tumor distance to Internal Anal Sphincter: 4.7 cm TUMOR DESCRIPTION Circumferential Extent: 100% Tumor Length: Bulky tumor in the upper and mid rectum, with length of 8.9 cm; overall tumor dimensions are 8.7 x 6.3 x 7.2 cm T - CATEGORY Extension through Muscularis Propria: Yes = T3 Shortest Distance of any tumor/node from Mesorectal Fascia: 0 mm, tumor directly abuts the mesorectal fascia posteriorly Extramural Vascular Invasion/Tumor Thrombus: Yes (e.g. on Image 16/9) Invasion of Anterior Peritoneal Reflection: No Involvement of Adjacent Organs or Pelvic Sidewall: No Levator Ani Involvement: No N - CATEGORY Regional Lymph Nodes >=10mm: Total of at least 6 (see series 8) =N2a. Largest lymph node in left posterior perirectal space measures 1.7 cm. Other:  Sigmoid diverticulosis, without evidence of diverticulitis. 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 Electronically Signed: By: Marlaine Hind M.D. On: 06/17/2021 15:17   NM PET Image Initial (PI) Skull Base To Thigh (F-18  FDG)  Result Date: 06/07/2021 CLINICAL DATA:  Additional treatment strategy for colorectal mass. EXAM: NUCLEAR MEDICINE PET SKULL BASE TO THIGH TECHNIQUE: 8.5 mCi F-18 FDG was injected intravenously. Full-ring PET imaging was performed from the skull base to thigh after the radiotracer. CT data was obtained and used for attenuation correction and anatomic localization. Fasting blood glucose: 113 mg/dl COMPARISON:  CT and pelvis  dated May 11, 2021; CTA chest dated September 10, 2013 FINDINGS: Mediastinal blood pool activity: SUV max 2.9 Liver activity: SUV max 4.2 NECK: No hypermetabolic lymph nodes in the neck. Incidental CT findings: none CHEST: No hypermetabolic mediastinal or hilar nodes. No suspicious pulmonary nodules on the CT scan. Incidental CT findings: Calcifications the LAD and aorta solid pulmonary nodule of the right lung apex measuring 4 mm on series 4 image 50. solid left lobe measuring 5 mm on image 70. Left upper lobe measuring 4 mm on image 62. Bilateral air trapping. ABDOMEN/PELVIS: Ill-defined mass adjacent to the rectum with an SUV max of 20, unchanged size compared to prior exam. Ill-defined nodule is seen in the left perirectal fat more inferiorly on image 171 measuring 1.4 x 1.7 cm and SUV 14.3, likely enlarged perirectal lymph node. No abnormal hypermetabolic activity within the liver, pancreas, adrenal glands, or spleen. Incidental CT findings: Diverticula of the sigmoid colon. Atherosclerotic disease of the abdominal aorta. SKELETON: No focal hypermetabolic activity to suggest skeletal metastasis. Incidental CT findings: none IMPRESSION: Hypermetabolic mass adjacent to the rectum, likely due to colorectal primary neoplasm. Ill-defined hypermetabolic nodule is seen more inferiorly in the left perirectal fat, likely a lymph node metastasis. Numerous small bilateral small solid pulmonary nodules which are below resolution for PET-CT FDG avidity and new compared to 2015 chest CT. Recommend  attention on follow-up. Electronically Signed   By: Yetta Glassman M.D.   On: 06/07/2021 08:57     .Adin Hector, M.D., F.A.C.S. Gastrointestinal and Minimally Invasive Surgery [facility] [address], [place],  [address] [phone] Main / Paging  06/22/2021 1:20 PM    Adin Hector

## 2021-06-23 ENCOUNTER — Encounter (HOSPITAL_COMMUNITY): Payer: Self-pay | Admitting: Gynecologic Oncology

## 2021-06-23 ENCOUNTER — Ambulatory Visit: Payer: Medicare HMO

## 2021-06-23 ENCOUNTER — Other Ambulatory Visit: Payer: Self-pay

## 2021-06-23 DIAGNOSIS — U071 COVID-19: Secondary | ICD-10-CM | POA: Diagnosis present

## 2021-06-23 DIAGNOSIS — K56609 Unspecified intestinal obstruction, unspecified as to partial versus complete obstruction: Secondary | ICD-10-CM | POA: Diagnosis present

## 2021-06-23 DIAGNOSIS — Z933 Colostomy status: Secondary | ICD-10-CM

## 2021-06-23 LAB — BASIC METABOLIC PANEL
Anion gap: 12 (ref 5–15)
BUN: 8 mg/dL (ref 8–23)
CO2: 22 mmol/L (ref 22–32)
Calcium: 8.8 mg/dL — ABNORMAL LOW (ref 8.9–10.3)
Chloride: 103 mmol/L (ref 98–111)
Creatinine, Ser: 0.73 mg/dL (ref 0.44–1.00)
GFR, Estimated: >60 mL/min (ref >60)
Glucose, Bld: 127 mg/dL — ABNORMAL HIGH (ref 70–99)
Potassium: 3.8 mmol/L (ref 3.5–5.1)
Sodium: 137 mmol/L (ref 135–145)

## 2021-06-23 LAB — CYTOLOGY - PAP
Comment: NEGATIVE
Diagnosis: NEGATIVE
High risk HPV: NEGATIVE

## 2021-06-23 LAB — CBC
HCT: 39 % (ref 36.0–46.0)
Hemoglobin: 13 g/dL (ref 12.0–15.0)
MCH: 31 pg (ref 26.0–34.0)
MCHC: 33.3 g/dL (ref 30.0–36.0)
MCV: 92.9 fL (ref 80.0–100.0)
Platelets: 309 10*3/uL (ref 150–400)
RBC: 4.2 MIL/uL (ref 3.87–5.11)
RDW: 14.1 % (ref 11.5–15.5)
WBC: 17.2 10*3/uL — ABNORMAL HIGH (ref 4.0–10.5)
nRBC: 0 % (ref 0.0–0.2)

## 2021-06-23 LAB — MAGNESIUM: Magnesium: 1.9 mg/dL (ref 1.7–2.4)

## 2021-06-23 NOTE — Consult Note (Addendum)
[place] Nurse ostomy follow up Pt is in isolation for Covid. Colostomy surgery was performed yesterday.  Husband was present at the bedside for pouch change demonstration and teaching session.  This was the first post-op day and pt states she feels groggy.  Stoma type/location: Stoma is red and viable, 1 1/2 inches, valley surrounding the stoma, which is even with skin level.   Peristomal assessment: intact skin surrounding Output: 30cc pink liquid, no stool or flatus Ostomy pouching: 2pc. Applied today; will need one piece flexible convex pouch next time.  Education provided:  Demonstrated pouch change using 2 piece pouch and barrier ring.  Pt and husband watched the procedure but did not ask questions or assist. Demonstrated opening and closing the pouch to empty.  Educational materials left in the room and ordered supplies.  Use supplies: barrier ring, [place] # [id number], convex Roselee Culver # [id number]. [place] team will perform another teaching session on Friday.  Enrolled patient in [place] Start Discharge program: Yes  Thank-you,  Julien Girt MSN, [place], [place], [place], [place]

## 2021-06-23 NOTE — Progress Notes (Signed)
The proposed treatment discussed in conference is for discussion purpose only and is not a binding recommendation.  The patients have not been physically examined, or presented with their treatment options.  Therefore, final treatment plans cannot be decided.  

## 2021-06-23 NOTE — Plan of Care (Signed)

## 2021-06-23 NOTE — Progress Notes (Signed)
Mariah Schultz [id number] 25-Feb-1956  CARE TEAM:  PCP: Seward Carol, MD  Outpatient Care Team: Patient Care Team: Seward Carol, MD as PCP - General (Internal Medicine) Orson Slick, MD as Consulting Physician (Hematology and Oncology) Otis Brace, MD as Consulting Physician (Gastroenterology) Michael Boston, MD as Consulting Physician (Colon and Rectal Surgery) Lafonda Mosses, MD as Consulting Physician (Gynecologic Oncology) Gery Pray, MD as Consulting Physician (Radiation Oncology)  Inpatient Treatment Team: Treatment Team: Attending Provider: Michael Boston, MD; [place] Nurse: Tenna Child, RN; Nurse Practitioner: Dorothyann Gibbs, NP; Technician: Towns, Narda Amber, [place]; Charge Nurse: Charlyne Petrin, RN; Consulting Physician: Michael Boston, MD; Social Worker: Lowella Curb, [place]; Utilization Review: Crutchfield, Antony Haste, RN; Consulting Physician: Orson Slick, MD; Consulting Physician: Gery Pray, MD   Problem List:   Active Problems:   Pelvic mass in female   Malignant tumor of pelvis ([place])   1 Day Post-Op  06/22/2021  POST-OPERATIVE DIAGNOSIS:   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  Liquid diet.  Given the fact she feels somewhat bloated, hold off on any solid food for now  Tylenol and gabapentin for baseline.  Requiring IV parenteral pain medication.  Follow  Ostomy care and teaching.  Would benefit from follow-up at ostomy clinic.  Already seen by Maryfrances Bunnell.  Agree patient will need a  convex pouch since the colostomy was hard to bring up.  Follow-up on pathologies.  Per Dr Berline Lopes, discussed at gynecological oncology tumor board.  I discussed at GI tumor board.  Very rare situation.  Discussed with patient and her daughter at bedside.  They asked many appropriate questions & concerns.  Usually treatment for squamous cell cancers involve a combination of chemotherapy and radiation.  We will await input from medical and radiation oncology who most likely want to see what staining on core biopsies show.  -VTE prophylaxis- SCDs, etc  -mobilize as tolerated to help recovery  Disposition:  Disposition:  The patient is from: Home  Anticipate discharge to:  Home with Home Health  Anticipated Date of Discharge is:  October 24,2022    Barriers to discharge:  Pending Clinical improvement (more likely than not)  Patient currently is NOT MEDICALLY STABLE for discharge from the hospital from a surgery standpoint.      30 minutes spent in review, evaluation, examination, counseling, and coordination of care.   I have reviewed this patient's available data, including medical history, events of note, physical examination and test results as part of my evaluation.  A significant portion of that time was spent in counseling.  Care during the described time interval was provided by me.  06/23/2021    Subjective: (Chief complaint)  Patient with pain and soreness requiring IV Dilaudid.  Tolerating liquids but not much appetite.  Daughter in room.  Patient has been up walking to bathroom.  Urinating on her own.  No walking hallways yet.  Objective:  Vital signs:  Vitals:   06/23/21 0141 06/23/21 0548 06/23/21 0935 06/23/21 1341  BP: 126/78 (!) 140/95  139/90 (!) 145/79  Pulse: 67 74 71 73  Resp: 16 16 18 18   Temp: 98 F (36.7 C) 97.7 F (36.5 C) 98 F (36.7 C) 98.3 F (36.8 C)  TempSrc: Oral Oral Oral Oral  SpO2: 95% 92% 90% 92%  Weight:  76 kg    Height:         Last BM Date:  (new colostomy this admit)  Intake/Output   Yesterday:  10/18 0701 - 10/19 0700 In: 780 [P.O.:480; I.V.:200; IV Piggyback:100] Out: 740 [Urine:500; Blood:90] This shift:  Total I/O In: 980 [P.O.:480; I.V.:500] Out: 130 [Urine:100; Stool:30]  Bowel function:  Flatus: No  BM:  No  Drain: (No drain)   Physical Exam:  General: Pt awake/alert in no acute distress Eyes: PERRL, normal EOM.  Sclera clear.  No icterus Neuro: CN II-XII intact w/o focal sensory/motor deficits. Lymph: No head/neck/groin lymphadenopathy Psych:  No delerium/psychosis/paranoia.  Oriented x 4 HENT: Normocephalic, Mucus membranes moist.  No thrush Neck: Supple, No tracheal deviation.  No obvious thyromegaly Chest: No pain to chest wall compression.  Good respiratory excursion.  No audible wheezing CV:  Pulses intact.  Regular rhythm.  No major extremity edema MS: Normal AROM mjr joints.  No obvious deformity  Abdomen: Somewhat firm.  Moderately distended.  Nontender.  Colostomy left upper quadrant pink.  No flatus or stool.  Mild edema.  No evidence of peritonitis.  No incarcerated hernias.  Ext:   No deformity.  No mjr edema.  No cyanosis Skin: No petechiae / purpurea.  No major sores.  Warm and dry    Results:   Cultures: Recent Results (from the past 720 hour(s))  SARS Coronavirus 2 (TAT 6-24 hrs)     Status: Abnormal   Collection Time: 06/21/21 12:00 AM  Result Value Ref Range Status   SARS Coronavirus 2 RESULT: POSITIVE (A)  Final    Comment: RESULT: POSITIVESARS-CoV-2 INTERPRETATION:A POSITIVE  test result means that SARS-CoV-2 RNA was present in the specimen above the limit of detection of this test. The presence of SARS-CoV-2 RNA is indicative of infection with SARS-CoV-2. Detection of  SARS-CoV-2 RNA may not rule-out bacterial infection or co-infection with other viruses. Positive and negative predictive values of testing are highly dependent on prevalence. False positive  test results are more likely when prevalence of disease is  low.The expected result is NEGATIVE.Fact Sheet for Healthcare Providers: SisterViews.hu Sheet for Patients: SwimConditioning.hu Reference Range - Negative     Labs: Results for orders placed or performed during the hospital encounter of 06/22/21 (from the past 48 hour(s))  ABO/Rh     Status: None   Collection Time: 06/22/21 12:32 PM  Result Value Ref Range   ABO/RH(D)      A POS Performed at [facility], [place] [address] [place], [place] [address]   Basic metabolic panel     Status: Abnormal   Collection Time: 06/23/21  4:21 AM  Result Value Ref Range   Sodium 137 135 - 145 mmol/L   Potassium 3.8 3.5 - 5.1 mmol/L   Chloride 103 98 - 111 mmol/L   CO2 22 22 - 32 mmol/L   Glucose, Bld 127 (H) 70 - 99 mg/dL    Comment: Glucose reference range applies only to samples taken after fasting for at least 8 hours.   BUN 8 8 - 23 mg/dL   Creatinine, Ser 0.73 0.44 - 1.00 mg/dL   Calcium 8.8 (L) 8.9 - 10.3 mg/dL   GFR, Estimated >60 >  60 mL/min    Comment: (NOTE) Calculated using the CKD-EPI Creatinine Equation (2021)    Anion gap 12 5 - 15    Comment: Performed at [facility], [place] [address], [place], [place] [address]  CBC     Status: Abnormal   Collection Time: 06/23/21  4:21 AM  Result Value Ref Range   WBC 17.2 (H) 4.0 - 10.5 K/uL   RBC 4.20 3.87 - 5.11 MIL/uL   Hemoglobin 13.0 12.0 - 15.0 g/dL   HCT 39.0 36.0 - 46.0 %   MCV 92.9 80.0 - 100.0 fL   MCH 31.0 26.0 - 34.0 pg   MCHC 33.3 30.0 - 36.0 g/dL   RDW 14.1 11.5 - 15.5 %   Platelets 309 150 - 400 K/uL   nRBC 0.0 0.0 - 0.2 %    Comment: Performed at [facility], [place] [address], [place], [place] [address]  Magnesium     Status: None   Collection Time: 06/23/21  4:21 AM  Result Value Ref Range   Magnesium 1.9 1.7 - 2.4 mg/dL    Comment: Performed at  [facility], [place] [address], [place], [place] [address]    Imaging / Studies: No results found.  Medications / Allergies: per chart  Antibiotics: Anti-infectives (From admission, onward)    Start     Dose/Rate Route Frequency Ordered Stop   06/23/21 0300  cefoTEtan (CEFOTAN) 2 g in sodium chloride 0.9 % 100 mL IVPB        2 g 200 mL/hr over 30 Minutes Intravenous Every 12 hours 06/22/21 1940 06/23/21 0357   06/22/21 1400  neomycin (MYCIFRADIN) tablet 1,000 mg  Status:  Discontinued       See Hyperspace for full Linked Orders Report.   1,000 mg Oral 3 times per day 06/22/21 1137 06/22/21 1146   06/22/21 1400  metroNIDAZOLE (FLAGYL) tablet 1,000 mg  Status:  Discontinued       See Hyperspace for full Linked Orders Report.   1,000 mg Oral 3 times per day 06/22/21 1137 06/22/21 1146   06/22/21 1145  cefoTEtan (CEFOTAN) 2 g in sodium chloride 0.9 % 100 mL IVPB        2 g 200 mL/hr over 30 Minutes Intravenous On call to O.R. 06/22/21 1137 06/22/21 1512         Note: Portions of this report may have been transcribed using voice recognition software. Every effort was made to ensure accuracy; however, inadvertent computerized transcription errors may be present.   Any transcriptional errors that result from this process are unintentional.    Adin Hector, MD, FACS, MASCRS Esophageal, Gastrointestinal & Colorectal Surgery Robotic and Minimally Invasive Surgery  [facility], [place]  [place]. [address], [place], [place] [address] [phone] Fax [phone] Main  CONTACT INFORMATION:  Weekday (9AM-5PM): Call [facility] main office at [phone]  Weeknight (5PM-9AM) or Weekend/Holiday: Check [email or web address] (password " [email or web address]") for General Surgery [facility] coverage  (Please, do not use SecureChat as it is not reliable communication to operating surgeons for immediate patient care)      06/23/2021   3:38 PM

## 2021-06-23 NOTE — Anesthesia Postprocedure Evaluation (Signed)
Anesthesia Post Note  Patient: Mariah Schultz  Procedure(s) Performed: LAPAROSCOPY DIAGNOSTIC, DIVERTING LOOP COLOSTOMY, ANORECTAL EXAM UNDER ANESTHESIA, TRANSRECTAL CORE BIOPSIES. (Abdomen)     Patient location during evaluation: PACU Anesthesia Type: General Level of consciousness: awake and alert and oriented Pain management: pain level controlled Vital Signs Assessment: post-procedure vital signs reviewed and stable Respiratory status: spontaneous breathing, nonlabored ventilation and respiratory function stable Cardiovascular status: blood pressure returned to baseline Postop Assessment: no apparent nausea or vomiting Anesthetic complications: no   No notable events documented.    Marthenia Rolling

## 2021-06-23 NOTE — Progress Notes (Signed)
1 Day Post-Op Procedure(s) (LRB): LAPAROSCOPY DIAGNOSTIC, DIVERTING LOOP COLOSTOMY, ANORECTAL EXAM UNDER ANESTHESIA, TRANSRECTAL CORE BIOPSIES. (N/A)  Subjective: Patient reports no nausea or emesis this am. She states her pain is controlled with prn medications. She is due to void with foley removed this am. Husband at the bedside. No concerns voiced and all questions answered by Dr. Berline Lopes.     Objective: Vital signs in last 24 hours: Temp:  [96.3 F (35.7 C)-98.4 F (36.9 C)] 97.7 F (36.5 C) (10/19 0548) Pulse Rate:  [67-84] 74 (10/19 0548) Resp:  [12-25] 16 (10/19 0548) BP: (126-157)/(76-95) 140/95 (10/19 0548) SpO2:  [90 %-100 %] 92 % (10/19 0548) Weight:  [167 lb 8.8 oz (76 kg)-168 lb (76.2 kg)] 167 lb 8.8 oz (76 kg) (10/19 0548) Last BM Date:  (new colostomy this admit)  Intake/Output from previous day: 10/18 0701 - 10/19 0700 In: 780 [P.O.:480; I.V.:200; IV Piggyback:100] Out: 740 [Urine:500; Blood:90]  Physical Examination: General: alert, cooperative, and no distress Resp: clear to auscultation bilaterally Cardio: regular rate and rhythm, S1, S2 normal, no murmur, click, rub or gallop GI: incision: lap sites to the abdomen with dermabond intact with no drainage noted and colostomy appliance intact with minimal serosanguinous drainage in the bag. Stoma pink, viable Extremities: extremities normal, atraumatic, no cyanosis or edema  Labs: WBC/Hgb/Hct/Plts:  17.2/13.0/39.0/309 (10/19 0421) BUN/Cr/glu/ALT/AST/amyl/lip:  8/0.73/--/--/--/--/-- (10/19 0421)  Assessment: 65 y.o. s/p Procedure(s): LAPAROSCOPY DIAGNOSTIC, DIVERTING LOOP COLOSTOMY, ANORECTAL EXAM UNDER ANESTHESIA, TRANSRECTAL CORE BIOPSIES.: stable Pain:  Pain is well-controlled on PRN medications.  Heme: Hgb 13.0 and Hct 39.0 this am-appropriate compared with pre-op values and surgical losses.  ID: WBC 17.2 this am. Cefotetan administered during the surgery. Given dexamethasone intra-op.   CV: BP and HR  stable- monitored with routine vital signs. Avapro and HCTZ ordered along with vasotec PRN.  GI:  Tolerating po: Yes, sips of liquids. S/P loop colostomy.  GU: Due to void this am. Creatinine 0.73 this am.     FEN: No critical values noted.  Prophylaxis: SCDs on and lovenox ordered.  Plan: Diet advancement and plan of care per Dr. Johney Maine Encourage IS use, increasing mobility Ostomy teaching   LOS: 1 day    Mariah Schultz 06/23/2021, 9:27 AM

## 2021-06-24 ENCOUNTER — Encounter: Payer: Self-pay | Admitting: Oncology

## 2021-06-24 ENCOUNTER — Ambulatory Visit: Payer: Medicare HMO

## 2021-06-24 MED ORDER — SIMETHICONE 80 MG PO CHEW
80.0000 mg | CHEWABLE_TABLET | Freq: Four times a day (QID) | ORAL | Status: DC
  Administered 2021-06-24 – 2021-06-25 (×5): 80 mg via ORAL
  Filled 2021-06-24 (×5): qty 1

## 2021-06-24 NOTE — Progress Notes (Signed)
Requested immunos (P16) and stains on accession [id number] with [facility] via email.

## 2021-06-24 NOTE — Progress Notes (Signed)
Mariah Schultz [id number] 1956-03-30  CARE TEAM:  PCP: Seward Carol, MD  Outpatient Care Team: Patient Care Team: Seward Carol, MD as PCP - General (Internal Medicine) Orson Slick, MD as Consulting Physician (Hematology and Oncology) Otis Brace, MD as Consulting Physician (Gastroenterology) Michael Boston, MD as Consulting Physician (Colon and Rectal Surgery) Lafonda Mosses, MD as Consulting Physician (Gynecologic Oncology) Gery Pray, MD as Consulting Physician (Radiation Oncology)  Inpatient Treatment Team: Treatment Team: Attending Provider: Michael Boston, MD; [place] Nurse: Tenna Child, RN; Nurse Practitioner: Dorothyann Gibbs, NP; Consulting Physician: Michael Boston, MD; Consulting Physician: Orson Slick, MD; Consulting Physician: Gery Pray, MD; Technician: Samara Snide, NT; Charge Nurse: Arminda Resides, RN; Charge Nurse: Jennye Boroughs, RN; Pharmacist: Randa Spike, [facility]   Problem List:   Principal Problem:   Squamous Cell Carcinoma of presacral region  Active Problems:   Hypertension   Pelvic mass in female   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 06/22/2021   Colostomy in place [facility])   COVID-19   2 Days Post-Op  06/22/2021  POST-OPERATIVE DIAGNOSIS:   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  Liquid diet.  Given the fact she feels somewhat bloated, hold off on any solid food for now  Tylenol and gabapentin for baseline.  Requiring IV  parenteral pain medication.  Follow  Ostomy care and teaching.  Would benefit from follow-up at ostomy clinic.  Already seen by Maryfrances Bunnell.  Agree patient will need a convex pouch since the colostomy was hard to bring up.  Follow-up on pathologies.  Per Dr Berline Lopes, discussed at gynecological oncology tumor board.  I discussed at GI tumor board.  Very rare situation.  Discussed with patient and her daughter at bedside.  They asked many appropriate questions & concerns.  Usually treatment for squamous cell cancers involve a combination of chemotherapy and radiation.  We will await input from medical and radiation oncology who most likely want to see what staining on core biopsies show.  -VTE prophylaxis- SCDs, etc  -mobilize as tolerated to help recovery  Disposition:  Disposition:  The patient is from: Home  Anticipate discharge to:  Home with Home Health  Anticipated Date of Discharge is:  October 24,2022    Barriers to discharge:  Pending Clinical improvement (more likely than not)  Patient currently is NOT MEDICALLY STABLE for discharge from the hospital from a surgery standpoint.      30 minutes spent in review, evaluation, examination, counseling, and coordination of care.   I have reviewed this patient's available data, including medical history, events of note, physical examination and test results as part of my evaluation.  A significant portion of that time was spent in counseling.  Care during the described time interval was provided by me.  I discussed operative findings, updated the patient's status, discussed probable steps to recovery, and gave postoperative recommendations to the patient and nurse.  Recommendations were made.  Questions were answered.  They expressed understanding & appreciation.   06/24/2021  Subjective: (Chief complaint)  Patient with pain and soreness requiring IV Dilaudid.  Tolerating liquids but occasionally feels bloated  Has been  sleeping in room  Patient up walking more.  Notes some rectal drainage  Objective:  Vital signs:  Vitals:   06/23/21 1341 06/23/21 1732 06/23/21 2145 06/24/21 0507  BP: (!) 145/79 137/65 119/60 134/77  Pulse: 73 79 70 67  Resp: 18 18 16 16   Temp: 98.3 F (36.8 C) 98.5 F (36.9 C) 98.3 F (36.8 C) 98.6 F (37 C)  TempSrc: Oral Oral Oral Oral  SpO2: 92% 92% 93% (!) 88%  Weight:    78.5 kg  Height:        Last BM Date:  (new colostomy this admit)  Intake/Output   Yesterday:  10/19 0701 - 10/20 0700 In: 1903 [P.O.:1200; I.V.:703] Out: 230 [Urine:200; Stool:30] This shift:  No intake/output data recorded.  Bowel function:  Flatus: YES  BM:  No  Drain: (No drain)   Physical Exam:  General: Pt awake/alert in no acute distress.  Tired but not toxic/sickly Eyes: PERRL, normal EOM.  Sclera clear.  No icterus Neuro: CN II-XII intact w/o focal sensory/motor deficits. Lymph: No head/neck/groin lymphadenopathy Psych:  No delerium/psychosis/paranoia.  Oriented x 4 HENT: Normocephalic, Mucus membranes moist.  No thrush Neck: Supple, No tracheal deviation.  No obvious thyromegaly Chest: No pain to chest wall compression.  Good respiratory excursion.  No audible wheezing CV:  Pulses intact.  Regular rhythm.  No major extremity edema MS: Normal AROM mjr joints.  No obvious deformity  Abdomen: Soft.  Moderately distended.  Nontender.  Colostomy left upper quadrant pink.  No flatus or stool.  Mild edema.  No evidence of peritonitis to call for bed shake or percussion..  No incarcerated hernias.  Ext:   No deformity.  No mjr edema.  No cyanosis Skin: No petechiae / purpurea.  No major sores.  Warm and dry    Results:   Cultures: Recent Results (from the past 720 hour(s))  SARS Coronavirus 2 (TAT 6-24 hrs)     Status: Abnormal   Collection Time: 06/21/21 12:00 AM  Result Value Ref Range Status   SARS Coronavirus 2 RESULT: POSITIVE (A)  Final    Comment: RESULT:  POSITIVESARS-CoV-2 INTERPRETATION:A POSITIVE  test result means that SARS-CoV-2 RNA was present in the specimen above the limit of detection of this test. The presence of SARS-CoV-2 RNA is indicative of infection with SARS-CoV-2. Detection of  SARS-CoV-2 RNA may not rule-out bacterial infection or co-infection with other viruses. Positive and negative predictive values of testing are highly dependent on prevalence. False positive test results are more likely when prevalence of disease is  low.The expected result is NEGATIVE.Fact Sheet for Healthcare Providers: SisterViews.hu Sheet for Patients: SwimConditioning.hu Reference Range - Negative     Labs: Results for orders placed or performed during the hospital encounter of 06/22/21 (from the past 48 hour(s))  ABO/Rh     Status: None   Collection Time: 06/22/21 12:32 PM  Result Value Ref Range   ABO/RH(D)      A POS Performed at [facility], [place] [address] [place], [place] [address]   Basic metabolic panel     Status: Abnormal   Collection Time: 06/23/21  4:21 AM  Result Value Ref Range   Sodium 137 135 - 145 mmol/L   Potassium 3.8 3.5 - 5.1 mmol/L   Chloride 103 98 - 111 mmol/L   CO2 22 22 - 32 mmol/L  Glucose, Bld 127 (H) 70 - 99 mg/dL    Comment: Glucose reference range applies only to samples taken after fasting for at least 8 hours.   BUN 8 8 - 23 mg/dL   Creatinine, Ser 0.73 0.44 - 1.00 mg/dL   Calcium 8.8 (L) 8.9 - 10.3 mg/dL   GFR, Estimated >60 >60 mL/min    Comment: (NOTE) Calculated using the CKD-EPI Creatinine Equation (2021)    Anion gap 12 5 - 15    Comment: Performed at [facility], [place] [address] [place], [place] [address]  CBC     Status: Abnormal   Collection Time: 06/23/21  4:21 AM  Result Value Ref Range   WBC 17.2 (H) 4.0 - 10.5 K/uL   RBC 4.20 3.87 - 5.11 MIL/uL   Hemoglobin 13.0 12.0 - 15.0 g/dL   HCT 39.0  36.0 - 46.0 %   MCV 92.9 80.0 - 100.0 fL   MCH 31.0 26.0 - 34.0 pg   MCHC 33.3 30.0 - 36.0 g/dL   RDW 14.1 11.5 - 15.5 %   Platelets 309 150 - 400 K/uL   nRBC 0.0 0.0 - 0.2 %    Comment: Performed at [facility], [place] [address], [place], [place] [address]  Magnesium     Status: None   Collection Time: 06/23/21  4:21 AM  Result Value Ref Range   Magnesium 1.9 1.7 - 2.4 mg/dL    Comment: Performed at [facility], [place] [address], [place], [place] [address]    Imaging / Studies: No results found.  Medications / Allergies: per chart  Antibiotics: Anti-infectives (From admission, onward)    Start     Dose/Rate Route Frequency Ordered Stop   06/23/21 0300  cefoTEtan (CEFOTAN) 2 g in sodium chloride 0.9 % 100 mL IVPB        2 g 200 mL/hr over 30 Minutes Intravenous Every 12 hours 06/22/21 1940 06/23/21 0357   06/22/21 1400  neomycin (MYCIFRADIN) tablet 1,000 mg  Status:  Discontinued       See Hyperspace for full Linked Orders Report.   1,000 mg Oral 3 times per day 06/22/21 1137 06/22/21 1146   06/22/21 1400  metroNIDAZOLE (FLAGYL) tablet 1,000 mg  Status:  Discontinued       See Hyperspace for full Linked Orders Report.   1,000 mg Oral 3 times per day 06/22/21 1137 06/22/21 1146   06/22/21 1145  cefoTEtan (CEFOTAN) 2 g in sodium chloride 0.9 % 100 mL IVPB        2 g 200 mL/hr over 30 Minutes Intravenous On call to O.R. 06/22/21 1137 06/22/21 1512         Note: Portions of this report may have been transcribed using voice recognition software. Every effort was made to ensure accuracy; however, inadvertent computerized transcription errors may be present.   Any transcriptional errors that result from this process are unintentional.    Adin Hector, MD, FACS, MASCRS Esophageal, Gastrointestinal & Colorectal Surgery Robotic and Minimally Invasive Surgery  [facility], [place]  [place] [place], [place] [address] [phone] Fax [phone] [address]  CONTACT INFORMATION:  Weekday (9AM-5PM): Call [facility] main office at [phone]  Weeknight (5PM-9AM) or Weekend/Holiday: Check [email or web address] (password " [email or web address]") for General Surgery [facility] coverage  (Please, do not use SecureChat as it is not reliable communication to operating surgeons for immediate patient care)  06/24/2021  7:28 AM

## 2021-06-24 NOTE — Progress Notes (Signed)
2 Days Post-Op Procedure(s) (LRB): LAPAROSCOPY DIAGNOSTIC, DIVERTING LOOP COLOSTOMY, ANORECTAL EXAM UNDER ANESTHESIA, TRANSRECTAL CORE BIOPSIES. (N/A)  Subjective: Patient reports tolerating liquids with no nausea or emesis. She reports abdominal soreness that is controlled with prn medications. Voiding without difficulty. Husband at the bedside. Has had flatus in the ostomy bag along with output. She has ambulated in the hall. Final path discussed with patient and husband by Dr. Berline Lopes.     Objective: Vital signs in last 24 hours: Temp:  [97.8 F (36.6 C)-98.6 F (37 C)] 97.8 F (36.6 C) (10/20 1344) Pulse Rate:  [64-79] 64 (10/20 1344) Resp:  [16-18] 16 (10/20 1344) BP: (119-148)/(60-77) 148/75 (10/20 1344) SpO2:  [88 %-93 %] 93 % (10/20 1344) Weight:  [173 lb 1 oz (78.5 kg)] 173 lb 1 oz (78.5 kg) (10/20 0507) Last BM Date:  (new colostomy this admit)  Intake/Output from previous day: 10/19 0701 - 10/20 0700 In: 1903 [P.O.:1200; I.V.:703] Out: 230 [Urine:200; Stool:30]  Physical Examination (performed by Dr. Berline Lopes): General: alert, cooperative, and no distress Resp: clear to auscultation bilaterally Cardio: regular rate and rhythm, S1, S2 normal, no murmur, click, rub or gallop GI: incision: lap sites to the abdomen with dermabond intact with no drainage noted and colostomy appliance intact with liquid, brown stool in the bag. Stoma pink, viable. Abdomen distended with hyperactive bowel sounds Extremities: extremities normal, atraumatic, no cyanosis or edema O2 sat on RA 96-97%  Labs: Labs from 06/23/2021  Assessment: 65 y.o. s/p Procedure(s): LAPAROSCOPY DIAGNOSTIC, DIVERTING LOOP COLOSTOMY, ANORECTAL EXAM UNDER ANESTHESIA, TRANSRECTAL CORE BIOPSIES for a presacral/retrorectal pelvic mass. 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  Pain:  Pain is well-controlled on PRN medications.  Heme: Hgb 13.0 and Hct 39.0 06/23/21 am-appropriate compared  with pre-op values and surgical losses.  ID: WBC 17.2 06/23/2021 am. Cefotetan administered during the surgery. Given dexamethasone intra-op.   CV: BP and HR stable- monitored with routine vital signs. Avapro and HCTZ ordered along with vasotec PRN.  GI:  Tolerating po: Yes, liquids. S/P loop colostomy now with output and flatus.  GU: Voiding adequate amounts. Creatinine 0.73 06/23/21 am.     FEN: No critical values noted on 06/23/2021 lab work.  Prophylaxis: SCDs on and lovenox ordered.  Plan: Diet advancement and plan of care per Dr. Johney Maine Encourage IS use, increasing mobility Ostomy teaching   LOS: 2 days    Mariah Schultz 06/24/2021, 3:05 PM

## 2021-06-24 NOTE — Progress Notes (Signed)
Mobility Specialist - Progress Note    06/24/21 1121  Mobility  Activity Ambulated in hall  Level of Assistance Standby assist, set-up cues, supervision of patient - no hands on  Assistive Device Front wheel walker  Distance Ambulated (ft) 300 ft  Mobility Ambulated with assistance in hallway  Mobility Response Tolerated well  Mobility performed by Mobility specialist;Family member  $Mobility charge 1 Mobility   Upon entry pt requested assistance to use bathroom. Once finished, pt was agreeable to ambulate in hall and used RW to walk 300 ft in hallway. Pt was accompanied by family member during ambulation. Pt did not report feeling pain or dizziness during session and was returned to recliner once finished. Pt requested for pain medication once seated in recliner and was left with family in room and call bell at side. RN informed of session and pt's request for pain medication.   [place] Specialist Acute Rehabilitation Services Phone: [phone] 06/24/21, 11:23 AM

## 2021-06-25 ENCOUNTER — Ambulatory Visit: Payer: Medicare HMO

## 2021-06-25 DIAGNOSIS — E876 Hypokalemia: Secondary | ICD-10-CM

## 2021-06-25 LAB — CBC
HCT: 34.8 % — ABNORMAL LOW (ref 36.0–46.0)
Hemoglobin: 11.7 g/dL — ABNORMAL LOW (ref 12.0–15.0)
MCH: 31 pg (ref 26.0–34.0)
MCHC: 33.6 g/dL (ref 30.0–36.0)
MCV: 92.1 fL (ref 80.0–100.0)
Platelets: 265 10*3/uL (ref 150–400)
RBC: 3.78 MIL/uL — ABNORMAL LOW (ref 3.87–5.11)
RDW: 13.8 % (ref 11.5–15.5)
WBC: 8.3 10*3/uL (ref 4.0–10.5)
nRBC: 0 % (ref 0.0–0.2)

## 2021-06-25 LAB — SURGICAL PATHOLOGY

## 2021-06-25 LAB — CREATININE, SERUM
Creatinine, Ser: 0.61 mg/dL (ref 0.44–1.00)
GFR, Estimated: >60 mL/min (ref >60)

## 2021-06-25 LAB — POTASSIUM: Potassium: 3 mmol/L — ABNORMAL LOW (ref 3.5–5.1)

## 2021-06-25 LAB — MAGNESIUM: Magnesium: 2 mg/dL (ref 1.7–2.4)

## 2021-06-25 MED ORDER — OXYCODONE HCL 5 MG PO TABS: 5.0000 mg | ORAL_TABLET | Freq: Four times a day (QID) | ORAL | 0 refills | Status: DC | PRN

## 2021-06-25 MED ORDER — ONDANSETRON HCL 4 MG PO TABS: 4.0000 mg | ORAL_TABLET | Freq: Three times a day (TID) | ORAL | 5 refills | Status: DC | PRN

## 2021-06-25 MED ORDER — POTASSIUM CHLORIDE 10 MEQ/100ML IV SOLN
10.0000 meq | INTRAVENOUS | Status: AC
  Filled 2021-06-25: qty 100

## 2021-06-25 MED ORDER — POTASSIUM CHLORIDE CRYS ER 20 MEQ PO TBCR
40.0000 meq | EXTENDED_RELEASE_TABLET | Freq: Every day | ORAL | Status: DC
Start: 2021-06-25 — End: 2021-06-25
  Administered 2021-06-25: 40 meq via ORAL
  Filled 2021-06-25: qty 2

## 2021-06-25 MED ORDER — OXYCODONE HCL 5 MG PO TABS
5.0000 mg | ORAL_TABLET | ORAL | Status: DC | PRN
  Administered 2021-06-25 (×2): 5 mg via ORAL
  Filled 2021-06-25 (×2): qty 1

## 2021-06-25 NOTE — Progress Notes (Signed)
   06/25/21 1430  Mobility  Activity Ambulated in hall  Level of Assistance Standby assist, set-up cues, supervision of patient - no hands on  Assistive Device Front wheel walker  Distance Ambulated (ft) 350 ft  Mobility Ambulated with assistance in hallway  Mobility Response Tolerated well  Mobility performed by Mobility specialist  $Mobility charge 1 Mobility   Pt agreeable to mobilizing this afternoon. Ambulated about 383ft in hall with RW, tolerated well. No complaints. Left pt in chair with lunch, call bell at side. RN notified of session.    [place] Specialist Acute Rehab Services Office: [phone]

## 2021-06-25 NOTE — TOC Transition Note (Signed)
Transition of Care [facility]) - CM/SW Discharge Note   Patient Details  Name: Mariah Schultz MRN: [id number] Date of Birth: 01-08-1956  Transition of [facility]) CM/SW Contact:  Lennart Pall, LCSW Phone Number: 06/25/2021, 12:35 PM   Clinical Narrative:    Have spoken with pt about orders placed for [facility].  Pt is agreeable and has no agency preference.  Referral placed with [facility].  No further TOC needs.   Final next level of care: [place] Barriers to Discharge: Continued Medical Work up   Patient Goals and CMS Choice Patient states their goals for this hospitalization and ongoing recovery are:: return home      Discharge Placement                       Discharge Plan and Services                DME Arranged: Ostomy supplies         HH Arranged: RN [place] Agency: [place] Date [place]: 06/25/21 Time HH Agency Contacted: 1100 Representative spoke with at [place]: [place] ([place]) Interventions     Readmission Risk Interventions No flowsheet data found.

## 2021-06-25 NOTE — Care Management Important Message (Signed)
Important Message  Patient Details IM Letter given to the Patient. Name: Mariah Schultz MRN: [id number] Date of Birth: November 14, 1955   Medicare Important Message Given:  Yes     Kerin Salen 06/25/2021, 12:51 PM

## 2021-06-25 NOTE — Progress Notes (Signed)
GYN Oncology Follow Up  3 Days Post-Op Procedure(s) (LRB): LAPAROSCOPY DIAGNOSTIC, DIVERTING LOOP COLOSTOMY, ANORECTAL EXAM UNDER ANESTHESIA, TRANSRECTAL CORE BIOPSIES. (N/A)  Subjective: Patient reports tolerating soft foods with no nausea or emesis. She continues to report abdominal soreness that is controlled with prn medications. Voiding without difficulty. Husband at the bedside. Has had flatus in the ostomy bag along with output. She has ambulated in the hall. No concerns voiced.     Objective: Vital signs in last 24 hours: Temp:  [98 F (36.7 C)-98.6 F (37 C)] 98.1 F (36.7 C) (10/21 1401) Pulse Rate:  [61-66] 61 (10/21 1401) Resp:  [17-18] 17 (10/21 1401) BP: (138-162)/(79-82) 138/82 (10/21 1401) SpO2:  [93 %-97 %] 97 % (10/21 1401) Last BM Date:  (new colostomy this admit)  Intake/Output from previous day: 10/20 0701 - 10/21 0700 In: 1200 [P.O.:1200] Out: 2400 [Urine:2150; Stool:250]  Physical Examination (performed by Dr. Berline Lopes): General: alert, cooperative, and no distress GI: incision: lap sites to the abdomen with dermabond intact with no drainage noted and colostomy appliance intact with flatus in the bag. Stoma pink, viable. Abdomen less distended and soft Extremities: extremities normal, atraumatic, no cyanosis or edema  Labs: Labs from 06/25/21  Assessment: 65 y.o. s/p Procedure(s): LAPAROSCOPY DIAGNOSTIC, DIVERTING LOOP COLOSTOMY, ANORECTAL EXAM UNDER ANESTHESIA, TRANSRECTAL CORE BIOPSIES for a presacral/retrorectal pelvic mass. 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  Pain:  Pain is well-controlled on PRN medications.  Heme: Hgb 11.7 and Hct 34.8 this am-appropriate compared with pre-op values and surgical losses.  ID: WBC 8.3 this am. 17.2 on 06/23/2021 am. Cefotetan administered during the surgery. Given dexamethasone intra-op.   CV: BP and HR stable- monitored with routine vital signs. Avapro and HCTZ ordered along with  vasotec PRN.  GI:  Tolerating po: Yes, soft diet. S/P loop colostomy now with output and flatus.  GU: Voiding adequate amounts. Creatinine 0.61 this am.     FEN: No critical values noted on lab work but potassium 3.0-supplementation ordered per Dr. Johney Maine.  Prophylaxis: SCDs on and lovenox ordered.  Plan: Continue plan of care per Dr. Johney Maine   LOS: 3 days    Dorothyann Gibbs 06/25/2021, 2:51 PM

## 2021-06-25 NOTE — Progress Notes (Signed)
Pt alert and oriented, tolerating diet. D/C instructions given, Pt d/cd to home.

## 2021-06-25 NOTE — Consult Note (Addendum)
[place] Nurse ostomy follow up Pt is in isolation for Covid. Husband and daughter were present at the bedside for pouch change demonstration and teaching session.  Stoma type/location: Stoma is red and viable, 1 1/2 inches, peristomal valley surrounding the stoma, which is even with skin level.   Peristomal assessment: intact skin surrounding Output: 50cc brown liquid stool Ostomy pouching: 1pc flexible convex. Education provided:  Pt assisted with pouch change procedure using a hand-held mirror.  Applied barrier ring and one piece flexible convex pouch.  She was able to open and close velcro to empty.  Asked appropriate questions.  Reviewed pouching routines and ordering supplies. Educational materials left in the room with 5 sets of barrier rings and flexible convex pouches. Use supplies: barrier ring, [place] # [id number], convex Roselee Culver # [id number]. Pt could benefit from home health assistance after discharge.  [place] team will see again Mon if patient is still here at that time. Enrolled patient in [place] Start Discharge program: Yes, previously Thank-you,  Julien Girt MSN, [place], [place], [place], [place]

## 2021-06-25 NOTE — Progress Notes (Signed)
Mariah Schultz [id number] 02-12-1956  CARE TEAM:  PCP: Seward Carol, MD  Outpatient Care Team: Patient Care Team: Seward Carol, MD as PCP - General (Internal Medicine) Orson Slick, MD as Consulting Physician (Hematology and Oncology) Otis Brace, MD as Consulting Physician (Gastroenterology) Michael Boston, MD as Consulting Physician (Colon and Rectal Surgery) Lafonda Mosses, MD as Consulting Physician (Gynecologic Oncology) Gery Pray, MD as Consulting Physician (Radiation Oncology)  Inpatient Treatment Team: Treatment Team: Attending Provider: Michael Boston, MD; [place] Nurse: Tenna Child, RN; Nurse Practitioner: Dorothyann Gibbs, NP; Consulting Physician: Michael Boston, MD; Consulting Physician: Orson Slick, MD; Consulting Physician: Gery Pray, MD; Mobility Specialist: Viviann Spare Charge Nurse: Arminda Resides, RN; Pharmacist: Lynelle Doctor, [facility]; Technician: Karna Christmas, NT; Charge Nurse: Orlena Sheldon, RN   Problem List:   Principal Problem:   Squamous Cell Carcinoma of presacral region  Active Problems:   Hypertension   Pelvic mass in female   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 06/22/2021   Colostomy in place [facility])   COVID-19   Hypokalemia   3 Days Post-Op  06/22/2021  POST-OPERATIVE DIAGNOSIS:   PRESACRAL/RETRORECTAL PELVIC MASS - SQUAMOUS CELL CARCINOMA    PROCEDURE:   LAPAROSCOPY DIAGNOSTIC LAPAROSCOPIC LYSIS OF ADHESIONS ANORECTAL EXAMINATION UNDER ANESTHESIA TRANSRECTAL PELVIC MASS CORE BIOPSIES DIVERTING LOOP COLOSTOMY  TRANSVERSUS ABDOMINIS PLANE (TAP) BLOCK - BILATERAL   SURGEON:  Adin Hector, MD   ASSISTANT:  Jeral Pinch, MD [place] much better now  [facility] Stay = 3 days)  Plan:  ERAS - Enhanced recovery pathway.  Hypokalemia.  Corrected.  Patient does not want IV and prefers p.o. only.  Advance to solid  diet.  Tylenol and gabapentin for baseline.  Transitioning to oral pain control.   Ostomy care and teaching.  Would benefit from follow-up at ostomy clinic.  Already seen by wound ostomy care nurses including Dawn Engel.  Agree patient will need a convex pouch since the colostomy was hard to bring up.  Follow-up on pathologies.  Extra tumor markers being checked  Per Dr Berline Lopes, discussed at gynecological oncology tumor board.  I discussed at GI tumor board.  Very rare situation.  Discussed with patient and her daughter at bedside.  They asked many appropriate questions & concerns.  Usually treatment for squamous cell cancers involve a combination of chemotherapy and radiation.  We will await input from medical and radiation oncology who most likely want to see what staining on core biopsies show.  -VTE prophylaxis- SCDs, etc  -mobilize as tolerated to help recovery  Disposition:  Disposition:  The patient is from: Home  Anticipate discharge to:  Home with Home Health  Anticipated Date of Discharge is:  October 21,2022    Barriers to discharge:  Pending Clinical improvement (more likely than not)  Patient currently is NOT MEDICALLY STABLE for discharge from the hospital from a surgery standpoint.      30 minutes spent in review, evaluation, examination, counseling, and coordination of care.   I have reviewed this patient's available data, including medical history, events of note, physical examination and test results as part of my evaluation.  A significant portion of that time was spent in counseling.  Care during the described time interval was provided by me.  I discussed operative findings, updated the patient's status, discussed probable steps to recovery, and gave postoperative recommendations to the patient  and nurse.  Recommendations were made.  Questions were answered.  They expressed understanding & appreciation.   06/25/2021    Subjective: (Chief  complaint)  Patient passing gas and having bowel movements.  Feels much better.  Mary tolerated liquids.  Has noted passing a few small clots transanally.  No major bleeding.  No leakage.  Walking hallways.  Daughter at bedside.  Nursing just outside room.  Objective:  Vital signs:  Vitals:   06/24/21 0507 06/24/21 1344 06/24/21 2159 06/25/21 0504  BP: 134/77 (!) 148/75 (!) 157/79 (!) 162/81  Pulse: 67 64 66 63  Resp: 16 16 18 18   Temp: 98.6 F (37 C) 97.8 F (36.6 C) 98.6 F (37 C) 98 F (36.7 C)  TempSrc: Oral Oral Oral Oral  SpO2: (!) 88% 93% 93% 93%  Weight: 78.5 kg     Height:        Last BM Date:  (new colostomy this admit)  Intake/Output   Yesterday:  10/20 0701 - 10/21 0700 In: 1200 [P.O.:1200] Out: 2400 [Urine:2150; Stool:250] This shift:  No intake/output data recorded.  Bowel function:  Flatus: YES  BM:  No  Drain: (No drain)   Physical Exam:  General: Pt awake/alert in no acute distress.  Tired but not toxic/sickly Eyes: PERRL, normal EOM.  Sclera clear.  No icterus Neuro: CN II-XII intact w/o focal sensory/motor deficits. Lymph: No head/neck/groin lymphadenopathy Psych:  No delerium/psychosis/paranoia.  Oriented x 4 HENT: Normocephalic, Mucus membranes moist.  No thrush Neck: Supple, No tracheal deviation.  No obvious thyromegaly Chest: No pain to chest wall compression.  Good respiratory excursion.  No audible wheezing CV:  Pulses intact.  Regular rhythm.  No major extremity edema MS: Normal AROM mjr joints.  No obvious deformity  Abdomen: Soft.  Mildy distended.  Nontender.  Colostomy left upper quadrant pink.  Flatus and liquid stool in bag.  Less edema.  No evidence of peritonitis to call for bed shake or percussion..  No incarcerated hernias.  Ext:   No deformity.  No mjr edema.  No cyanosis Skin: No petechiae / purpurea.  No major sores.  Warm and dry    Results:   Cultures: Recent Results (from the past 720 hour(s))  SARS  Coronavirus 2 (TAT 6-24 hrs)     Status: Abnormal   Collection Time: 06/21/21 12:00 AM  Result Value Ref Range Status   SARS Coronavirus 2 RESULT: POSITIVE (A)  Final    Comment: RESULT: POSITIVESARS-CoV-2 INTERPRETATION:A POSITIVE  test result means that SARS-CoV-2 RNA was present in the specimen above the limit of detection of this test. The presence of SARS-CoV-2 RNA is indicative of infection with SARS-CoV-2. Detection of  SARS-CoV-2 RNA may not rule-out bacterial infection or co-infection with other viruses. Positive and negative predictive values of testing are highly dependent on prevalence. False positive test results are more likely when prevalence of disease is  low.The expected result is NEGATIVE.Fact Sheet for Healthcare Providers: SisterViews.hu Sheet for Patients: SwimConditioning.hu Reference Range - Negative     Labs: Results for orders placed or performed during the hospital encounter of 06/22/21 (from the past 48 hour(s))  CBC     Status: Abnormal   Collection Time: 06/25/21  5:44 AM  Result Value Ref Range   WBC 8.3 4.0 - 10.5 K/uL   RBC 3.78 (L) 3.87 - 5.11 MIL/uL   Hemoglobin 11.7 (L) 12.0 - 15.0 g/dL   HCT 34.8 (L) 36.0 - 46.0 %   MCV 92.1 80.0 -  100.0 fL   MCH 31.0 26.0 - 34.0 pg   MCHC 33.6 30.0 - 36.0 g/dL   RDW 13.8 11.5 - 15.5 %   Platelets 265 150 - 400 K/uL   nRBC 0.0 0.0 - 0.2 %    Comment: Performed at [facility], [place] [address], [place], [place] [address]  Potassium     Status: Abnormal   Collection Time: 06/25/21  5:44 AM  Result Value Ref Range   Potassium 3.0 (L) 3.5 - 5.1 mmol/L    Comment: Performed at [facility], [place] [address] [place], [place] [address]  Magnesium     Status: None   Collection Time: 06/25/21  5:44 AM  Result Value Ref Range   Magnesium 2.0 1.7 - 2.4 mg/dL    Comment: Performed at [facility], [place]  [address] [place], [place] [address]  Creatinine, serum     Status: None   Collection Time: 06/25/21  5:44 AM  Result Value Ref Range   Creatinine, Ser 0.61 0.44 - 1.00 mg/dL   GFR, Estimated >60 >60 mL/min    Comment: (NOTE) Calculated using the CKD-EPI Creatinine Equation (2021) Performed at [facility], [place] [address], [place], [place] [address]     Imaging / Studies: No results found.  Medications / Allergies: per chart  Antibiotics: Anti-infectives (From admission, onward)    Start     Dose/Rate Route Frequency Ordered Stop   06/23/21 0300  cefoTEtan (CEFOTAN) 2 g in sodium chloride 0.9 % 100 mL IVPB        2 g 200 mL/hr over 30 Minutes Intravenous Every 12 hours 06/22/21 1940 06/23/21 0357   06/22/21 1400  neomycin (MYCIFRADIN) tablet 1,000 mg  Status:  Discontinued       See Hyperspace for full Linked Orders Report.   1,000 mg Oral 3 times per day 06/22/21 1137 06/22/21 1146   06/22/21 1400  metroNIDAZOLE (FLAGYL) tablet 1,000 mg  Status:  Discontinued       See Hyperspace for full Linked Orders Report.   1,000 mg Oral 3 times per day 06/22/21 1137 06/22/21 1146   06/22/21 1145  cefoTEtan (CEFOTAN) 2 g in sodium chloride 0.9 % 100 mL IVPB        2 g 200 mL/hr over 30 Minutes Intravenous On call to O.R. 06/22/21 1137 06/22/21 1512         Note: Portions of this report may have been transcribed using voice recognition software. Every effort was made to ensure accuracy; however, inadvertent computerized transcription errors may be present.   Any transcriptional errors that result from this process are unintentional.    Adin Hector, MD, FACS, MASCRS Esophageal, Gastrointestinal & Colorectal Surgery Robotic and Minimally Invasive Surgery  [facility], [place]  [place]. [address] [place],  [address] [phone] Fax [phone] Main  CONTACT INFORMATION:  Weekday  (9AM-5PM): Call [facility] main office at [phone]  Weeknight (5PM-9AM) or Weekend/Holiday: Check [email or web address] (password " [email or web address]") for General Surgery [facility] coverage  (Please, do not use SecureChat as it is not reliable communication to operating surgeons for immediate patient care)      06/25/2021  7:47 AM

## 2021-06-25 NOTE — Discharge Summary (Signed)
Physician Discharge Summary    Patient ID: Mariah Schultz MRN: [id number] DOB/AGE: [id number]  65 y.o.  Patient Care Team: Seward Carol, MD as PCP - General (Internal Medicine) Orson Slick, MD as Consulting Physician (Hematology and Oncology) Otis Brace, MD as Consulting Physician (Gastroenterology) Michael Boston, MD as Consulting Physician (Colon and Rectal Surgery) Lafonda Mosses, MD as Consulting Physician (Gynecologic Oncology) Gery Pray, MD as Consulting Physician (Radiation Oncology)  Admit date: 06/22/2021  Discharge date: 06/25/2021  Hospital Stay = 3 days    Discharge Diagnoses:  Principal Problem:   Squamous Cell Carcinoma of presacral region  Active Problems:   Hypertension   Pelvic mass in female   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 06/22/2021   Colostomy in place [facility])   COVID-19   Hypokalemia   3 Days Post-Op  06/22/2021  POST-OPERATIVE DIAGNOSIS:   PRESACRAL/RETRORECTAL PELVIC MASS - SQUAMOUS CELL CARCINOMA    PROCEDURE:   LAPAROSCOPY DIAGNOSTIC LAPAROSCOPIC LYSIS OF ADHESIONS ANORECTAL EXAMINATION UNDER ANESTHESIA TRANSRECTAL PELVIC MASS CORE BIOPSIES DIVERTING LOOP COLOSTOMY  TRANSVERSUS ABDOMINIS PLANE (TAP) BLOCK - BILATERAL     SURGEON:  Adin Hector, MD   ASSISTANT:  Jeral Pinch, MD Chrissie Noa - [place]     Pre-operative Diagnosis: Pelvic mass noted within rectum on colonoscopy, pathology concerning for possible GYN malignancy (high-grade carcinoma)   Post-operative Diagnosis: same, normal appearing adnexa and cervical stump   Operation: EUA, diagnostic laparoscopy   Surgeon: Jeral Pinch MD   Assistant Surgeon: Lahoma Crocker MD (an MD assistant was necessary for tissue manipulation, management of robotic instrumentation, retraction and positioning due to the complexity of the case and hospital policies).   Consults: Gy Oncology, WOCN wound ostomy  nursing  Hospital Course:   The patient underwent the surgery above.  Postoperatively, the patient gradually mobilized and advanced to a solid diet.  Pain and other symptoms were treated aggressively.    By the time of discharge, the patient was walking well the hallways, eating food, having flatus.  Pain was well-controlled on an oral medications.  Ostomy training done with WOCN numerous times.  Based on meeting discharge criteria and continuing to recover, I felt it was safe for the patient to be discharged from the hospital to further recover with close followup. Postoperative recommendations were discussed in detail.  They are written as well.  Discharged Condition: good  Discharge Exam: Blood pressure 138/82, pulse 61, temperature 98.1 F (36.7 C), temperature source Oral, resp. rate 17, height 5\' 4"  (1.626 m), weight 78.5 kg, SpO2 97 %.  General: Pt awake/alert/oriented x4 in No acute distress Eyes: PERRL, normal EOM.  Sclera clear.  No icterus Neuro: CN II-XII intact w/o focal sensory/motor deficits. Lymph: No head/neck/groin lymphadenopathy Psych:  No delerium/psychosis/paranoia HENT: Normocephalic, Mucus membranes moist.  No thrush Neck: Supple, No tracheal deviation Chest: No chest wall pain w good excursion CV:  Pulses intact.  Regular rhythm MS: Normal AROM mjr joints.  No obvious deformity Abdomen: Soft.  Mildly distended.  Mildly tender at incisions only.  Ostomy pink & functioning w flatus & stool.  No evidence of peritonitis.  No incarcerated hernias. Ext:  SCDs BLE.  No mjr edema.  No cyanosis Skin: No petechiae / purpura   Disposition:    Follow-up Information     Michael Boston, MD Follow up in 3 week(s).   Specialties: General Surgery, Colon and Rectal Surgery Contact information: [address] [place] [address] [phone]  Care, [facility] Follow up.   Specialty: Home Health Services Why: to provide home health nursing  visits Contact information: [place] [address] [phone]                 Discharge disposition: 01-Home or Self Care       Discharge Instructions     Call MD for:   Complete by: As directed    FEVER > 101.5 F  (temperatures < 101.5 F are not significant)   Call MD for:  extreme fatigue   Complete by: As directed    Call MD for:  persistant dizziness or light-headedness   Complete by: As directed    Call MD for:  persistant nausea and vomiting   Complete by: As directed    Call MD for:  redness, tenderness, or signs of infection (pain, swelling, redness, odor or green/yellow discharge around incision site)   Complete by: As directed    Call MD for:  severe uncontrolled pain   Complete by: As directed    Diet - low sodium heart healthy   Complete by: As directed    Start with a bland diet such as soups, liquids, starchy foods, low fat foods, etc. the first few days at home. Gradually advance to a solid, low-fat, high fiber diet by the end of the first week at home.   Add a fiber supplement to your diet (Metamucil, etc) If you feel full, bloated, or constipated, stay on a full liquid or pureed/blenderized diet for a few days until you feel better and are no longer constipated.   Discharge instructions   Complete by: As directed    See Discharge Instructions If you are not getting better after two weeks or are noticing you are getting worse, contact our office [phone] for further advice.  We may need to adjust your medications, re-evaluate you in the office, send you to the emergency room, or see what other things we can do to help. The clinic staff is available to answer your questions during regular business hours (8:30am-5pm).  Please don't hesitate to call and ask to speak to one of our nurses for clinical concerns.    A surgeon from [facility] is always on call at the hospitals 24 hours/day If you have a medical  emergency, go to the nearest emergency room or call 911.   Discharge wound care:   Complete by: As directed    SEE OSTOMY CARE INSTRUCTIONS  It is good for closed incisions and even open wounds to be washed every day.  Shower every day.  Short baths are fine.  Wash the incisions and wounds clean with soap & water.    You may leave closed incisions open to air if it is dry.   You may cover the incision with clean gauze & replace it after your daily shower for comfort.  TEGADERM:  You have clear gauze band-aid dressings over your closed incision(s).  Remove the dressings 3 days after surgery.   Driving Restrictions   Complete by: As directed    You may drive when: - you are no longer taking narcotic prescription pain medication - you can comfortably wear a seatbelt - you can safely make sudden turns/stops without pain.   Increase activity slowly   Complete by: As directed    Start light daily activities --- self-care, walking, climbing stairs- beginning the day after surgery.  Gradually increase activities as tolerated.  Control your pain to be active.  Stop when you are tired.  Ideally, walk several times a day, eventually an hour a day.   Most people are back to most day-to-day activities in a few weeks.  It takes 4-6 weeks to get back to unrestricted, intense activity. If you can walk 30 minutes without difficulty, it is safe to try more intense activity such as jogging, treadmill, bicycling, low-impact aerobics, swimming, etc. Save the most intensive and strenuous activity for last (Usually 4-8 weeks after surgery) such as sit-ups, heavy lifting, contact sports, etc.  Refrain from any intense heavy lifting or straining until you are off narcotics for pain control.  You will have off days, but things should improve week-by-week. DO NOT PUSH THROUGH PAIN.  Let pain be your guide: If it hurts to do something, don't do it.   Lifting restrictions   Complete by: As directed    If you can walk 30  minutes without difficulty, it is safe to try more intense activity such as jogging, treadmill, bicycling, low-impact aerobics, swimming, etc. Save the most intensive and strenuous activity for last (Usually 4-8 weeks after surgery) such as sit-ups, heavy lifting, contact sports, etc.   Refrain from any intense heavy lifting or straining until you are off narcotics for pain control.  You will have off days, but things should improve week-by-week. DO NOT PUSH THROUGH PAIN.  Let pain be your guide: If it hurts to do something, don't do it.  Pain is your body warning you to avoid that activity for another week until the pain goes down.   May shower / Bathe   Complete by: As directed    May walk up steps   Complete by: As directed    Sexual Activity Restrictions   Complete by: As directed    You may have sexual intercourse when it is comfortable. If it hurts to do something, stop.       Allergies as of 06/25/2021       Reactions   Other Swelling   pneumonia vaccine        Medication List     STOP taking these medications    traMADol 50 MG tablet Commonly known as: ULTRAM       TAKE these medications    acetaminophen 500 MG tablet Commonly known as: TYLENOL Take 500 mg by mouth every 6 (six) hours as needed.   albuterol 108 (90 Base) MCG/ACT inhaler Commonly known as: VENTOLIN HFA Inhale 2 puffs into the lungs every 6 (six) hours as needed for wheezing or shortness of breath.   aspirin 81 MG tablet Take 81 mg by mouth daily.   FISH OIL PO Take 1 capsule by mouth daily.   multivitamin tablet Take 1 tablet by mouth daily.   ondansetron 4 MG tablet Commonly known as: ZOFRAN Take 1 tablet (4 mg total) by mouth every 8 (eight) hours as needed for nausea.   oxyCODONE 5 MG immediate release tablet Commonly known as: Oxy IR/ROXICODONE Take 1-2 tablets (5-10 mg total) by mouth every 6 (six) hours as needed for moderate pain, severe pain or breakthrough pain.    valsartan-hydrochlorothiazide 160-12.5 MG tablet Commonly known as: DIOVAN-HCT Take 1 tablet by mouth daily.   vitamin C 500 MG tablet Commonly known as: ASCORBIC ACID Take 500 mg by mouth daily.               Discharge Care Instructions  (From admission, onward)  Start     Ordered   06/25/21 0000  Discharge wound care:       Comments: SEE OSTOMY CARE INSTRUCTIONS  It is good for closed incisions and even open wounds to be washed every day.  Shower every day.  Short baths are fine.  Wash the incisions and wounds clean with soap & water.    You may leave closed incisions open to air if it is dry.   You may cover the incision with clean gauze & replace it after your daily shower for comfort.  TEGADERM:  You have clear gauze band-aid dressings over your closed incision(s).  Remove the dressings 3 days after surgery.   06/25/21 1439            Significant Diagnostic Studies:  Results for orders placed or performed during the hospital encounter of 06/22/21 (from the past 72 hour(s))  Basic metabolic panel     Status: Abnormal   Collection Time: 06/23/21  4:21 AM  Result Value Ref Range   Sodium 137 135 - 145 mmol/L   Potassium 3.8 3.5 - 5.1 mmol/L   Chloride 103 98 - 111 mmol/L   CO2 22 22 - 32 mmol/L   Glucose, Bld 127 (H) 70 - 99 mg/dL    Comment: Glucose reference range applies only to samples taken after fasting for at least 8 hours.   BUN 8 8 - 23 mg/dL   Creatinine, Ser 0.73 0.44 - 1.00 mg/dL   Calcium 8.8 (L) 8.9 - 10.3 mg/dL   GFR, Estimated >60 >60 mL/min    Comment: (NOTE) Calculated using the CKD-EPI Creatinine Equation (2021)    Anion gap 12 5 - 15    Comment: Performed at [facility], [place] [address] [place], [place] [address]  CBC     Status: Abnormal   Collection Time: 06/23/21  4:21 AM  Result Value Ref Range   WBC 17.2 (H) 4.0 - 10.5 K/uL   RBC 4.20 3.87 - 5.11 MIL/uL   Hemoglobin 13.0 12.0 - 15.0 g/dL   HCT  39.0 36.0 - 46.0 %   MCV 92.9 80.0 - 100.0 fL   MCH 31.0 26.0 - 34.0 pg   MCHC 33.3 30.0 - 36.0 g/dL   RDW 14.1 11.5 - 15.5 %   Platelets 309 150 - 400 K/uL   nRBC 0.0 0.0 - 0.2 %    Comment: Performed at [facility], [place] [address], [place], [place] [address]  Magnesium     Status: None   Collection Time: 06/23/21  4:21 AM  Result Value Ref Range   Magnesium 1.9 1.7 - 2.4 mg/dL    Comment: Performed at [facility], [place] [address], [place], [place] [address]  CBC     Status: Abnormal   Collection Time: 06/25/21  5:44 AM  Result Value Ref Range   WBC 8.3 4.0 - 10.5 K/uL   RBC 3.78 (L) 3.87 - 5.11 MIL/uL   Hemoglobin 11.7 (L) 12.0 - 15.0 g/dL   HCT 34.8 (L) 36.0 - 46.0 %   MCV 92.1 80.0 - 100.0 fL   MCH 31.0 26.0 - 34.0 pg   MCHC 33.6 30.0 - 36.0 g/dL   RDW 13.8 11.5 - 15.5 %   Platelets 265 150 - 400 K/uL   nRBC 0.0 0.0 - 0.2 %    Comment: Performed at [facility], [place] [address] [place], [place] [address]  Potassium  Status: Abnormal   Collection Time: 06/25/21  5:44 AM  Result Value Ref Range   Potassium 3.0 (L) 3.5 - 5.1 mmol/L    Comment: Performed at [facility], [place] [address] [place], [place] [address]  Magnesium     Status: None   Collection Time: 06/25/21  5:44 AM  Result Value Ref Range   Magnesium 2.0 1.7 - 2.4 mg/dL    Comment: Performed at [facility], [place] [address], [place], [place] [address]  Creatinine, serum     Status: None   Collection Time: 06/25/21  5:44 AM  Result Value Ref Range   Creatinine, Ser 0.61 0.44 - 1.00 mg/dL   GFR, Estimated >60 >60 mL/min    Comment: (NOTE) Calculated using the CKD-EPI Creatinine Equation (2021) Performed at [facility], [place] [address], [place], [place] [address]     No results found.  Past Medical History:  Diagnosis Date   CAP (community acquired pneumonia) 09/10/2013   Hypertension     Papilloma of left breast    Papilloma of left breast 02/26/2019   Perforated appendicitis 03/19/2013    Past Surgical History:  Procedure Laterality Date   ABDOMINAL HYSTERECTOMY     BREAST EXCISIONAL BIOPSY Left 02/26/2019   B9 Biopsy   BREAST LUMPECTOMY WITH RADIOACTIVE SEED LOCALIZATION Left 02/26/2019   Procedure: LEFT BREAST LUMPECTOMY WITH RADIOACTIVE SEED LOCALIZATION;  Surgeon: Fanny Skates, MD;  Location: [place];  Service: General;  Laterality: Left;   LAPAROSCOPIC APPENDECTOMY N/A 02/25/2013   Procedure: APPENDECTOMY LAPAROSCOPIC;  Surgeon: Harl Bowie, MD;  Location: [place];  Service: General;  Laterality: N/A;   LAPAROSCOPY N/A 06/22/2021   Procedure: LAPAROSCOPY DIAGNOSTIC, DIVERTING LOOP COLOSTOMY, ANORECTAL EXAM UNDER ANESTHESIA, TRANSRECTAL CORE BIOPSIES.;  Surgeon: Lafonda Mosses, MD;  Location: [facility];  Service: Gynecology;  Laterality: N/A;    Social History   Socioeconomic History   Marital status: Married    Spouse name: Not on file   Number of children: Not on file   Years of education: Not on file   Highest education level: Not on file  Occupational History   Not on file  Tobacco Use   Smoking status: Every Day    Packs/day: 1.00    Years: 40.00    Pack years: 40.00    Types: Cigarettes   Smokeless tobacco: Never  Vaping Use   Vaping Use: Never used  Substance and Sexual Activity   Alcohol use: Yes    Comment: occ   Drug use: No   Sexual activity: Not Currently    Birth control/protection: Surgical  Other Topics Concern   Not on file  Social History Narrative   Not on file   Social Determinants of Health   Financial Resource Strain: Low Risk    Difficulty of Paying Living Expenses: Not very hard  Food Insecurity: No Food Insecurity   Worried About Running Out of Food in the Last Year: Never true   Ran Out of Food in the Last Year: Never true  Transportation Needs: No Transportation Needs   Lack of  Transportation (Medical): No   Lack of Transportation (Non-Medical): No  Physical Activity: Not on file  Stress: Not on file  Social Connections: Not on file  Intimate Partner Violence: Not on file    Family History  Problem Relation Age of Onset   Heart disease Father    Colon cancer Neg Hx    Breast cancer Neg  Hx    Ovarian cancer Neg Hx    Pancreatic cancer Neg Hx    Prostate cancer Neg Hx    Endometrial cancer Neg Hx        Patient unaware    Current Facility-Administered Medications  Medication Dose Route Frequency Provider Last Rate Last Admin   0.9 %  sodium chloride infusion  250 mL Intravenous PRN Michael Boston, MD       acetaminophen (TYLENOL) tablet 1,000 mg  1,000 mg Oral Lajuana Ripple, MD   1,000 mg at 06/25/21 1159   albuterol (VENTOLIN HFA) 108 (90 Base) MCG/ACT inhaler 2 puff  2 puff Inhalation Q6H PRN Michael Boston, MD       alum & mag hydroxide-simeth (MAALOX/MYLANTA) 200-200-20 MG/5ML suspension 30 mL  30 mL Oral Q6H PRN Michael Boston, MD       alvimopan (ENTEREG) capsule 12 mg  12 mg Oral BID Michael Boston, MD   12 mg at 06/25/21 1014   ascorbic acid (VITAMIN C) tablet 500 mg  500 mg Oral Daily Michael Boston, MD   500 mg at 06/25/21 1014   aspirin chewable tablet 81 mg  81 mg Oral Daily Michael Boston, MD   81 mg at 06/25/21 1014   Chlorhexidine Gluconate Cloth 2 % PADS 6 each  6 each Topical Once Michael Boston, MD       diphenhydrAMINE (BENADRYL) 12.5 MG/5ML elixir 12.5 mg  12.5 mg Oral Q6H PRN Michael Boston, MD   12.5 mg at 06/24/21 2105   Or   diphenhydrAMINE (BENADRYL) injection 12.5 mg  12.5 mg Intravenous Q6H PRN Michael Boston, MD   12.5 mg at 06/23/21 0107   enalaprilat (VASOTEC) injection 0.625-1.25 mg  0.625-1.25 mg Intravenous Q6H PRN Michael Boston, MD       enoxaparin (LOVENOX) injection 40 mg  40 mg Subcutaneous Q24H Michael Boston, MD   40 mg at 06/25/21 6283   feeding supplement (ENSURE SURGERY) liquid 237 mL  237 mL Oral BID BM Michael Boston, MD    237 mL at 06/25/21 1530   irbesartan (AVAPRO) tablet 150 mg  150 mg Oral Daily Michael Boston, MD   150 mg at 06/25/21 1014   And   hydrochlorothiazide (HYDRODIURIL) tablet 12.5 mg  12.5 mg Oral Daily Michael Boston, MD   12.5 mg at 06/25/21 1014   HYDROmorphone (DILAUDID) injection 0.5-2 mg  0.5-2 mg Intravenous Q4H PRN Michael Boston, MD   0.5 mg at 06/24/21 1437   lip balm (CARMEX) ointment 1 application  1 application Topical BID Michael Boston, MD   1 application at 15/17/61 2059   magic mouthwash  15 mL Oral QID PRN Michael Boston, MD       melatonin tablet 3 mg  3 mg Oral QHS PRN Michael Boston, MD   3 mg at 06/23/21 2159   methocarbamol (ROBAXIN) 1,000 mg in dextrose 5 % 100 mL IVPB  1,000 mg Intravenous Q6H PRN Michael Boston, MD       methocarbamol (ROBAXIN) tablet 1,000 mg  1,000 mg Oral Q6H PRN Michael Boston, MD       metoprolol tartrate (LOPRESSOR) injection 5 mg  5 mg Intravenous Q6H PRN Michael Boston, MD       multivitamin with minerals tablet 1 tablet  1 tablet Oral Daily Michael Boston, MD   1 tablet at 06/25/21 1014   ondansetron (ZOFRAN) tablet 4 mg  4 mg Oral Q6H PRN Michael Boston, MD       Or  ondansetron (ZOFRAN) injection 4 mg  4 mg Intravenous Q6H PRN Michael Boston, MD       oxyCODONE (Oxy IR/ROXICODONE) immediate release tablet 5-10 mg  5-10 mg Oral Q4H PRN Michael Boston, MD   5 mg at 06/25/21 1556   polycarbophil (FIBERCON) tablet 625 mg  625 mg Oral BID Michael Boston, MD   625 mg at 06/25/21 1014   potassium chloride SA (KLOR-CON) CR tablet 40 mEq  40 mEq Oral Daily Michael Boston, MD   40 mEq at 06/25/21 1014   prochlorperazine (COMPAZINE) tablet 10 mg  10 mg Oral Q6H PRN Michael Boston, MD       Or   prochlorperazine (COMPAZINE) injection 5-10 mg  5-10 mg Intravenous Q6H PRN Michael Boston, MD       simethicone (MYLICON) chewable tablet 40 mg  40 mg Oral Q6H PRN Michael Boston, MD       simethicone [facility]) chewable tablet 80 mg  80 mg Oral QID Michael Boston, MD   80 mg at  06/25/21 1529   sodium chloride flush (NS) 0.9 % injection 3 mL  3 mL Intravenous Gorden Harms, MD   3 mL at 06/23/21 2022   sodium chloride flush (NS) 0.9 % injection 3 mL  3 mL Intravenous PRN Michael Boston, MD       traMADol Veatrice Bourbon) tablet 50-100 mg  50-100 mg Oral Q6H PRN Michael Boston, MD   100 mg at 06/25/21 1014   Current Outpatient Medications  Medication Sig Dispense Refill   acetaminophen (TYLENOL) 500 MG tablet Take 500 mg by mouth every 6 (six) hours as needed.     aspirin 81 MG tablet Take 81 mg by mouth daily.     Multiple Vitamin (MULTIVITAMIN) tablet Take 1 tablet by mouth daily.     Omega-3 Fatty Acids (FISH OIL PO) Take 1 capsule by mouth daily.     ondansetron (ZOFRAN) 4 MG tablet Take 1 tablet (4 mg total) by mouth every 8 (eight) hours as needed for nausea. 8 tablet 5   valsartan-hydrochlorothiazide (DIOVAN-HCT) 160-12.5 MG tablet Take 1 tablet by mouth daily.     vitamin C (ASCORBIC ACID) 500 MG tablet Take 500 mg by mouth daily.     albuterol (PROVENTIL HFA;VENTOLIN HFA) 108 (90 BASE) MCG/ACT inhaler Inhale 2 puffs into the lungs every 6 (six) hours as needed for wheezing or shortness of breath.     oxyCODONE (OXY IR/ROXICODONE) 5 MG immediate release tablet Take 1-2 tablets (5-10 mg total) by mouth every 6 (six) hours as needed for moderate pain, severe pain or breakthrough pain. 30 tablet 0     Allergies  Allergen Reactions   Other Swelling    pneumonia vaccine    Signed: Morton Peters, MD, FACS, MASCRS Esophageal, Gastrointestinal & Colorectal Surgery Robotic and Minimally Invasive Surgery  [facility], [place]. [address] [place], [place] [address] [phone] Fax [phone] Main  CONTACT INFORMATION:  Weekday (9AM-5PM): Call [facility] main office at [phone]  Weeknight (5PM-9AM) or Weekend/Holiday: Check [email or web address] (password " [email or web address]") for General  Surgery [facility] coverage  (Please, do not use SecureChat as it is not reliable communication to operating surgeons for immediate patient care)      06/25/2021, 5:06 PM

## 2021-06-28 ENCOUNTER — Inpatient Hospital Stay (HOSPITAL_BASED_OUTPATIENT_CLINIC_OR_DEPARTMENT_OTHER): Payer: Medicare HMO | Admitting: Gynecologic Oncology

## 2021-06-28 ENCOUNTER — Encounter: Payer: Self-pay | Admitting: Gynecologic Oncology

## 2021-06-28 ENCOUNTER — Telehealth: Payer: Self-pay

## 2021-06-28 ENCOUNTER — Other Ambulatory Visit: Payer: Self-pay | Admitting: Oncology

## 2021-06-28 ENCOUNTER — Ambulatory Visit: Payer: Medicare HMO

## 2021-06-28 DIAGNOSIS — E876 Hypokalemia: Secondary | ICD-10-CM | POA: Diagnosis not present

## 2021-06-28 DIAGNOSIS — R918 Other nonspecific abnormal finding of lung field: Secondary | ICD-10-CM | POA: Diagnosis not present

## 2021-06-28 DIAGNOSIS — C801 Malignant (primary) neoplasm, unspecified: Secondary | ICD-10-CM

## 2021-06-28 DIAGNOSIS — C763 Malignant neoplasm of pelvis: Secondary | ICD-10-CM

## 2021-06-28 DIAGNOSIS — C20 Malignant neoplasm of rectum: Secondary | ICD-10-CM | POA: Diagnosis not present

## 2021-06-28 DIAGNOSIS — Z483 Aftercare following surgery for neoplasm: Secondary | ICD-10-CM | POA: Diagnosis not present

## 2021-06-28 DIAGNOSIS — U071 COVID-19: Secondary | ICD-10-CM | POA: Diagnosis not present

## 2021-06-28 DIAGNOSIS — Z433 Encounter for attention to colostomy: Secondary | ICD-10-CM | POA: Diagnosis not present

## 2021-06-28 DIAGNOSIS — Z9889 Other specified postprocedural states: Secondary | ICD-10-CM

## 2021-06-28 DIAGNOSIS — K56600 Partial intestinal obstruction, unspecified as to cause: Secondary | ICD-10-CM | POA: Diagnosis not present

## 2021-06-28 DIAGNOSIS — F17210 Nicotine dependence, cigarettes, uncomplicated: Secondary | ICD-10-CM | POA: Diagnosis not present

## 2021-06-28 DIAGNOSIS — I10 Essential (primary) hypertension: Secondary | ICD-10-CM | POA: Diagnosis not present

## 2021-06-28 NOTE — Telephone Encounter (Signed)
Spoke with Ms. Stewart this morning. She reports a reduced appetite but is eating, drinking and urinating well. She  is passing gas and stool through her stoma without difficulty. She denies fever or chills. Incisions are dry and intact. Patient states her pain is manageable and is controlled with alternating ibuprofen and tylenol with oxycodone prn.   Instructed to call office with any fever, chills, purulent drainage, uncontrolled pain or any other questions or concerns. Patient verbalizes understanding.   Pt aware of post op appointments as well as the office number [phone] and after hours number [phone] to call if she has any questions or concerns

## 2021-06-28 NOTE — Progress Notes (Signed)
Gynecologic Oncology Telehealth Consult Note: Gyn-Onc  I connected with Mariah Schultz on 06/28/21 at  4:00 PM EDT by telephone and verified that I am speaking with the correct person using two identifiers.  I discussed the limitations, risks, security and privacy concerns of performing an evaluation and management service by telemedicine and the availability of in-person appointments. I also discussed with the patient that there may be a patient responsible charge related to this service. The patient expressed understanding and agreed to proceed.  Other persons participating in the visit and their role in the encounter: None.  Patient's location: Home Provider's location: [facility]  Reason for Visit: Follow-up after recent surgery  Treatment History: 06/22/2021: Patient underwent exam under anesthesia, diagnostic laparoscopy, transrectal biopsies of presacral mass, and diverting loop colostomy.  Interval History: Patient reports overall doing well since leaving the hospital.  Appetite is still decreased but she is tolerating p.o. intake without nausea or emesis.  Continuing to note stool and flatus in her bag.  Stool has become more pasty.  Home health nurses coming for the first time this afternoon.  Denies any urinary symptoms.  Continues to have rectal pain, unchanged.  Past Medical/Surgical History: Past Medical History:  Diagnosis Date   CAP (community acquired pneumonia) 09/10/2013   Hypertension    Papilloma of left breast    Papilloma of left breast 02/26/2019   Perforated appendicitis 03/19/2013    Past Surgical History:  Procedure Laterality Date   ABDOMINAL HYSTERECTOMY     BREAST EXCISIONAL BIOPSY Left 02/26/2019   B9 Biopsy   BREAST LUMPECTOMY WITH RADIOACTIVE SEED LOCALIZATION Left 02/26/2019   Procedure: LEFT BREAST LUMPECTOMY WITH RADIOACTIVE SEED LOCALIZATION;  Surgeon: Fanny Skates, MD;  Location: [place];  Service: General;   Laterality: Left;   LAPAROSCOPIC APPENDECTOMY N/A 02/25/2013   Procedure: APPENDECTOMY LAPAROSCOPIC;  Surgeon: Harl Bowie, MD;  Location: [place];  Service: General;  Laterality: N/A;   LAPAROSCOPY N/A 06/22/2021   Procedure: LAPAROSCOPY DIAGNOSTIC, DIVERTING LOOP COLOSTOMY, ANORECTAL EXAM UNDER ANESTHESIA, TRANSRECTAL CORE BIOPSIES.;  Surgeon: Lafonda Mosses, MD;  Location: [facility];  Service: Gynecology;  Laterality: N/A;    Family History  Problem Relation Age of Onset   Heart disease Father    Colon cancer Neg Hx    Breast cancer Neg Hx    Ovarian cancer Neg Hx    Pancreatic cancer Neg Hx    Prostate cancer Neg Hx    Endometrial cancer Neg Hx        Patient unaware    Social History   Socioeconomic History   Marital status: Married    Spouse name: Not on file   Number of children: Not on file   Years of education: Not on file   Highest education level: Not on file  Occupational History   Not on file  Tobacco Use   Smoking status: Every Day    Packs/day: 1.00    Years: 40.00    Pack years: 40.00    Types: Cigarettes   Smokeless tobacco: Never  Vaping Use   Vaping Use: Never used  Substance and Sexual Activity   Alcohol use: Yes    Comment: occ   Drug use: No   Sexual activity: Not Currently    Birth control/protection: Surgical  Other Topics Concern   Not on file  Social History Narrative   Not on file   Social Determinants of Health   Financial Resource Strain: Low Risk  Difficulty of Paying Living Expenses: Not very hard  Food Insecurity: No Food Insecurity   Worried About Running Out of Food in the Last Year: Never true   Ran Out of Food in the Last Year: Never true  Transportation Needs: No Transportation Needs   Lack of Transportation (Medical): No   Lack of Transportation (Non-Medical): No  Physical Activity: Not on file  Stress: Not on file  Social Connections: Not on file    Current Medications:  Current Outpatient Medications:     acetaminophen (TYLENOL) 500 MG tablet, Take 500 mg by mouth every 6 (six) hours as needed., Disp: , Rfl:    albuterol (PROVENTIL HFA;VENTOLIN HFA) 108 (90 BASE) MCG/ACT inhaler, Inhale 2 puffs into the lungs every 6 (six) hours as needed for wheezing or shortness of breath., Disp: , Rfl:    aspirin 81 MG tablet, Take 81 mg by mouth daily., Disp: , Rfl:    Multiple Vitamin (MULTIVITAMIN) tablet, Take 1 tablet by mouth daily., Disp: , Rfl:    Omega-3 Fatty Acids (FISH OIL PO), Take 1 capsule by mouth daily., Disp: , Rfl:    ondansetron (ZOFRAN) 4 MG tablet, Take 1 tablet (4 mg total) by mouth every 8 (eight) hours as needed for nausea., Disp: 8 tablet, Rfl: 5   oxyCODONE (OXY IR/ROXICODONE) 5 MG immediate release tablet, Take 1-2 tablets (5-10 mg total) by mouth every 6 (six) hours as needed for moderate pain, severe pain or breakthrough pain., Disp: 30 tablet, Rfl: 0   valsartan-hydrochlorothiazide (DIOVAN-HCT) 160-12.5 MG tablet, Take 1 tablet by mouth daily., Disp: , Rfl:    vitamin C (ASCORBIC ACID) 500 MG tablet, Take 500 mg by mouth daily., Disp: , Rfl:   Review of Symptoms: Pertinent positives as per HPI.  Physical Exam: There were no vitals taken for this visit. Deferred given limitations of phone visit.  Laboratory & Radiologic Studies: A. PRESACRAL RETRORECTAL MASS, BIOPSY:  - Squamous cell carcinoma.   B. PRESACRAL RETRORECTAL MASS, BIOPSY:  - Squamous cell carcinoma.  Assessment & Plan: Mariah Schultz is a 65 y.o. 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  Patient is overall doing well from a postoperative standpoint.  We discussed continued expectations as well as limitations.  She is scheduled to see me in several weeks as well as Dr. Johney Maine for follow-up in the office.  From a cancer standpoint, I reviewed discussion at our GYN oncology tumor board this morning.  Given cell type as  well as location (both Dr. Johney Maine and I agreed that this would be unlikely to be resectable), our group recommends proceeding with definitive chemoradiation.  Treatment can start once the patient has healed from surgery.  I sent a note with our discussion to the patient's medical oncologist as well as treatment team.  I discussed the assessment and treatment plan with the patient. The patient was provided with an opportunity to ask questions and all were answered. The patient agreed with the plan and demonstrated an understanding of the instructions.   The patient was advised to call back or see an in-person evaluation if the symptoms worsen or if the condition fails to improve as anticipated.   26 minutes of total time was spent for this patient encounter, including preparation, face-to-face counseling with the patient and coordination of care, and documentation of the encounter.   Jeral Pinch, MD  Division of Gynecologic Oncology  Department of Obstetrics and Gynecology  [employer]

## 2021-06-28 NOTE — Progress Notes (Addendum)
Gynecologic Oncology Multi-Disciplinary Disposition Conference Note  Date of the Conference: 06/28/2021  Patient Name: Mariah Schultz  Primary GYN Oncologist: Dr. 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 Disposition is to primary chemotherapy with cisplatin and radiation.  PD-L1 testing has been requested. Follow up imaging will be ordered to monitor small pulmonary nodules.  This Multidisciplinary conference took place involving physicians from [place], [place], Radiation Oncology, Pathology, Radiology along with the Gynecologic Oncology Nurse Practitioner and RN.  Comprehensive assessment of the patient's malignancy, staging, need for surgery, chemotherapy, radiation therapy, and need for further testing were reviewed. Supportive measures, both inpatient and following discharge were also discussed. The recommended plan of care is documented. Greater than 35 minutes were spent correlating and coordinating this patient's care.

## 2021-06-29 ENCOUNTER — Ambulatory Visit: Payer: Medicare HMO

## 2021-06-30 ENCOUNTER — Ambulatory Visit: Payer: Medicare HMO

## 2021-07-01 ENCOUNTER — Telehealth: Payer: Self-pay | Admitting: Hematology and Oncology

## 2021-07-01 ENCOUNTER — Ambulatory Visit: Payer: Medicare HMO

## 2021-07-01 DIAGNOSIS — C20 Malignant neoplasm of rectum: Secondary | ICD-10-CM | POA: Diagnosis not present

## 2021-07-01 DIAGNOSIS — U071 COVID-19: Secondary | ICD-10-CM | POA: Diagnosis not present

## 2021-07-01 DIAGNOSIS — E876 Hypokalemia: Secondary | ICD-10-CM | POA: Diagnosis not present

## 2021-07-01 DIAGNOSIS — Z483 Aftercare following surgery for neoplasm: Secondary | ICD-10-CM | POA: Diagnosis not present

## 2021-07-01 DIAGNOSIS — F17210 Nicotine dependence, cigarettes, uncomplicated: Secondary | ICD-10-CM | POA: Diagnosis not present

## 2021-07-01 DIAGNOSIS — R918 Other nonspecific abnormal finding of lung field: Secondary | ICD-10-CM | POA: Diagnosis not present

## 2021-07-01 DIAGNOSIS — K56600 Partial intestinal obstruction, unspecified as to cause: Secondary | ICD-10-CM | POA: Diagnosis not present

## 2021-07-01 DIAGNOSIS — Z433 Encounter for attention to colostomy: Secondary | ICD-10-CM | POA: Diagnosis not present

## 2021-07-01 DIAGNOSIS — I10 Essential (primary) hypertension: Secondary | ICD-10-CM | POA: Diagnosis not present

## 2021-07-01 NOTE — Telephone Encounter (Signed)
Scheduled per 10/17 secure chat, patient has been called and voicemail was left.

## 2021-07-02 ENCOUNTER — Telehealth: Payer: Self-pay | Admitting: Oncology

## 2021-07-02 ENCOUNTER — Ambulatory Visit: Payer: Medicare HMO

## 2021-07-02 NOTE — Telephone Encounter (Signed)
Called Mariah Schultz and advised her of [facility] appointment on 07/08/21 at 1:30.  She verbalized understanding and agreement.

## 2021-07-05 ENCOUNTER — Encounter (HOSPITAL_COMMUNITY)
Admission: RE | Admit: 2021-07-05 | Discharge: 2021-07-05 | Disposition: A | Discharge status: Home / Self Care | Payer: Medicare HMO | Source: Ambulatory Visit | Attending: Surgery | Admitting: Surgery

## 2021-07-05 ENCOUNTER — Other Ambulatory Visit: Payer: Self-pay

## 2021-07-05 ENCOUNTER — Ambulatory Visit: Payer: Medicare HMO

## 2021-07-05 DIAGNOSIS — U071 COVID-19: Secondary | ICD-10-CM | POA: Diagnosis not present

## 2021-07-05 DIAGNOSIS — E876 Hypokalemia: Secondary | ICD-10-CM | POA: Diagnosis not present

## 2021-07-05 DIAGNOSIS — K56600 Partial intestinal obstruction, unspecified as to cause: Secondary | ICD-10-CM | POA: Diagnosis not present

## 2021-07-05 DIAGNOSIS — Z483 Aftercare following surgery for neoplasm: Secondary | ICD-10-CM | POA: Diagnosis not present

## 2021-07-05 DIAGNOSIS — K9400 Colostomy complication, unspecified: Secondary | ICD-10-CM

## 2021-07-05 DIAGNOSIS — I10 Essential (primary) hypertension: Secondary | ICD-10-CM | POA: Diagnosis not present

## 2021-07-05 DIAGNOSIS — F17210 Nicotine dependence, cigarettes, uncomplicated: Secondary | ICD-10-CM | POA: Diagnosis not present

## 2021-07-05 DIAGNOSIS — Z433 Encounter for attention to colostomy: Secondary | ICD-10-CM | POA: Diagnosis not present

## 2021-07-05 DIAGNOSIS — R918 Other nonspecific abnormal finding of lung field: Secondary | ICD-10-CM | POA: Diagnosis not present

## 2021-07-05 DIAGNOSIS — C20 Malignant neoplasm of rectum: Secondary | ICD-10-CM | POA: Diagnosis not present

## 2021-07-05 NOTE — Progress Notes (Signed)
[facility]   Reason for visit:  LUQ colostomy HPI:  Partial colonic obstruction with diverting loop colostomy ROS  Review of Systems  Gastrointestinal:        LUQ colostomy with soft brown stool  All other systems reviewed and are negative. Vital signs:  BP 138/89 (BP Location: Right Arm)   Pulse 90   Temp 97.9 F (36.6 C) (Oral)   Resp 18   Ht 5\' 4"  (1.626 m)   Wt 74.4 kg   SpO2 99%   BMI 28.15 kg/m  Exam:  Physical Exam Skin:         Comments: Abdominal skin creating a valley around stoma.  Needs barrier ring    Stoma type/location:  LUQ colostomy located in a dip of skin circumferentially. HAs been working with [facility].  Did not like 1 piece pouch and opted for 2 piece system. Stomal assessment/size:  1 3/8", flush, pink patent and producing soft brown stool.   Peristomal assessment:  stoma is flush and abdominal skin creating a valley around stoma.  Treatment options for stomal/peristomal skin: Barrier ring to flatten skin and promote seal.  2 piece pouch and barrier strips for added security and support at edges.  Output: soft brown stool, thicker at times and has had pancaking.  (Stool sticking around stoma and upper bag and not falling to the bottom)  Recommend adding miralax if she feels constipated, increasing fluids and adding adapt lubricating deodorant to make the pouch more slick.  Ostomy pouching: 2pc. 2 1/4" pouch with barrier ring and barrier strips.   Education provided:  POuch change performed.  Applied barrier strips, explained rationale and sent home with samples. She is going to ask her [place] about medical supply company and let me know if she needs further assistance getting supplies.     Impression/dx  Colostomy complication, Discussion  Implement barrier ring to improve wear time and adapt lubrication to prevent pancaking of stool.  Plan  Call clinic for supplies or issues as needed.     Visit time: 50 minutes.   Domenic Moras  FNP-BC

## 2021-07-06 ENCOUNTER — Ambulatory Visit: Payer: Medicare HMO

## 2021-07-06 NOTE — Discharge Instructions (Signed)
Adding barrier ring and barrier strips.  Notify clinic if I can aid in ordering supplies or any issues arise.

## 2021-07-07 ENCOUNTER — Ambulatory Visit: Payer: Medicare HMO

## 2021-07-08 ENCOUNTER — Inpatient Hospital Stay (HOSPITAL_BASED_OUTPATIENT_CLINIC_OR_DEPARTMENT_OTHER): Payer: Medicare HMO | Admitting: Hematology and Oncology

## 2021-07-08 ENCOUNTER — Other Ambulatory Visit: Payer: Self-pay

## 2021-07-08 ENCOUNTER — Other Ambulatory Visit: Payer: Self-pay | Admitting: Hematology and Oncology

## 2021-07-08 ENCOUNTER — Inpatient Hospital Stay: Payer: Medicare HMO

## 2021-07-08 ENCOUNTER — Ambulatory Visit: Admission: RE | Admit: 2021-07-08 | Payer: Medicare HMO | Source: Ambulatory Visit | Admitting: Radiation Oncology

## 2021-07-08 ENCOUNTER — Ambulatory Visit: Payer: Medicare HMO

## 2021-07-08 ENCOUNTER — Ambulatory Visit
Admission: RE | Admit: 2021-07-08 | Discharge: 2021-07-08 | Disposition: A | Discharge status: Home / Self Care | Payer: Medicare HMO | Source: Ambulatory Visit | Attending: Radiation Oncology | Admitting: Radiation Oncology

## 2021-07-08 VITALS — BP 133/80 | HR 77 | Temp 98.3°F | Resp 17 | Wt 164.2 lb

## 2021-07-08 DIAGNOSIS — Z5111 Encounter for antineoplastic chemotherapy: Secondary | ICD-10-CM | POA: Insufficient documentation

## 2021-07-08 DIAGNOSIS — Z79899 Other long term (current) drug therapy: Secondary | ICD-10-CM | POA: Insufficient documentation

## 2021-07-08 DIAGNOSIS — U071 COVID-19: Secondary | ICD-10-CM | POA: Diagnosis not present

## 2021-07-08 DIAGNOSIS — R1900 Intra-abdominal and pelvic swelling, mass and lump, unspecified site: Secondary | ICD-10-CM | POA: Insufficient documentation

## 2021-07-08 DIAGNOSIS — Z433 Encounter for attention to colostomy: Secondary | ICD-10-CM | POA: Diagnosis not present

## 2021-07-08 DIAGNOSIS — C763 Malignant neoplasm of pelvis: Secondary | ICD-10-CM

## 2021-07-08 DIAGNOSIS — Z8249 Family history of ischemic heart disease and other diseases of the circulatory system: Secondary | ICD-10-CM | POA: Insufficient documentation

## 2021-07-08 DIAGNOSIS — I10 Essential (primary) hypertension: Secondary | ICD-10-CM | POA: Diagnosis not present

## 2021-07-08 DIAGNOSIS — E876 Hypokalemia: Secondary | ICD-10-CM | POA: Diagnosis not present

## 2021-07-08 DIAGNOSIS — F17210 Nicotine dependence, cigarettes, uncomplicated: Secondary | ICD-10-CM | POA: Insufficient documentation

## 2021-07-08 DIAGNOSIS — Z483 Aftercare following surgery for neoplasm: Secondary | ICD-10-CM | POA: Diagnosis not present

## 2021-07-08 DIAGNOSIS — K6289 Other specified diseases of anus and rectum: Secondary | ICD-10-CM | POA: Diagnosis not present

## 2021-07-08 DIAGNOSIS — C20 Malignant neoplasm of rectum: Secondary | ICD-10-CM | POA: Insufficient documentation

## 2021-07-08 DIAGNOSIS — R911 Solitary pulmonary nodule: Secondary | ICD-10-CM | POA: Insufficient documentation

## 2021-07-08 DIAGNOSIS — K56600 Partial intestinal obstruction, unspecified as to cause: Secondary | ICD-10-CM | POA: Diagnosis not present

## 2021-07-08 DIAGNOSIS — G893 Neoplasm related pain (acute) (chronic): Secondary | ICD-10-CM | POA: Insufficient documentation

## 2021-07-08 DIAGNOSIS — R918 Other nonspecific abnormal finding of lung field: Secondary | ICD-10-CM | POA: Diagnosis not present

## 2021-07-08 LAB — CMP (CANCER CENTER ONLY)
ALT: 15 U/L (ref 0–44)
AST: 19 U/L (ref 15–41)
Albumin: 3.4 g/dL — ABNORMAL LOW (ref 3.5–5.0)
Alkaline Phosphatase: 130 U/L — ABNORMAL HIGH (ref 38–126)
Anion gap: 10 (ref 5–15)
BUN: 16 mg/dL (ref 8–23)
CO2: 26 mmol/L (ref 22–32)
Calcium: 9.7 mg/dL (ref 8.9–10.3)
Chloride: 103 mmol/L (ref 98–111)
Creatinine: 0.71 mg/dL (ref 0.44–1.00)
GFR, Estimated: >60 mL/min (ref >60)
Glucose, Bld: 134 mg/dL — ABNORMAL HIGH (ref 70–99)
Potassium: 3.5 mmol/L (ref 3.5–5.1)
Sodium: 139 mmol/L (ref 135–145)
Total Bilirubin: 0.3 mg/dL (ref 0.3–1.2)
Total Protein: 7.7 g/dL (ref 6.5–8.1)

## 2021-07-08 LAB — CBC WITH DIFFERENTIAL (CANCER CENTER ONLY)
Abs Immature Granulocytes: 0.02 10*3/uL (ref 0.00–0.07)
Basophils Absolute: 0 10*3/uL (ref 0.0–0.1)
Basophils Relative: 1 %
Eosinophils Absolute: 0.6 10*3/uL — ABNORMAL HIGH (ref 0.0–0.5)
Eosinophils Relative: 7 %
HCT: 38 % (ref 36.0–46.0)
Hemoglobin: 12.7 g/dL (ref 12.0–15.0)
Immature Granulocytes: 0 %
Lymphocytes Relative: 19 %
Lymphs Abs: 1.7 10*3/uL (ref 0.7–4.0)
MCH: 30.2 pg (ref 26.0–34.0)
MCHC: 33.4 g/dL (ref 30.0–36.0)
MCV: 90.3 fL (ref 80.0–100.0)
Monocytes Absolute: 0.7 10*3/uL (ref 0.1–1.0)
Monocytes Relative: 8 %
Neutro Abs: 5.8 10*3/uL (ref 1.7–7.7)
Neutrophils Relative %: 65 %
Platelet Count: 426 10*3/uL — ABNORMAL HIGH (ref 150–400)
RBC: 4.21 MIL/uL (ref 3.87–5.11)
RDW: 13.8 % (ref 11.5–15.5)
WBC Count: 8.8 10*3/uL (ref 4.0–10.5)
nRBC: 0 % (ref 0.0–0.2)

## 2021-07-08 MED ORDER — ONDANSETRON HCL 8 MG PO TABS: 8.0000 mg | ORAL_TABLET | Freq: Three times a day (TID) | ORAL | 0 refills | Status: DC | PRN

## 2021-07-08 MED ORDER — PROCHLORPERAZINE MALEATE 10 MG PO TABS: 10.0000 mg | ORAL_TABLET | Freq: Four times a day (QID) | ORAL | 0 refills | Status: DC | PRN

## 2021-07-08 MED ORDER — LIDOCAINE-PRILOCAINE 2.5-2.5 % EX CREA: 1.0000 "application " | TOPICAL_CREAM | CUTANEOUS | 0 refills | Status: AC | PRN

## 2021-07-08 MED ORDER — OXYCODONE HCL 5 MG PO TABS: 5.0000 mg | ORAL_TABLET | Freq: Four times a day (QID) | ORAL | 0 refills | Status: DC | PRN

## 2021-07-08 NOTE — Progress Notes (Signed)
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 I discussed with the patient that a referral to gyn-oncology would be beneficial for detailed pelvic examination and input concerning the etiology of the mass.   Since my initial consultation patient was seen by Dr. Berline Lopes.  Her impression was this was not a primary cervical cancer.  Biopsy of the cervix revealed squamous mucosa with reactive changes and no evidence of dysplasia or malignancy.  October 18 the patient was taken to the operating room by Dr. Berline Lopes and Dr. Johney Maine  Patient was found to have a large 8 x 10 cm mass relatively fixed noted to be filling the cul-de-sac and ending the distinguishable from the cervix.  The mass was fixed to the posterior lower pelvis limiting mobility of the lower sigmoid and rectum.  The mass was deemed unresectable.  Patient had placement of a diverting loop colostomy by Dr. Johney Maine.  The patient followed up with Dr. Berline Lopes on 06/28/21 and was noted to be doing well from a postoperative standpoint. From a cancer standpoint, Dr. Berline Lopes reviewed the patients case at the Gyn-oncology tumor board. Following discussion, and given the cell type and location of tumor, her mass was determined to not likely be resectable. Given this, the patient was recommended to proceed with definitive chemoradiation.   Today, the patient followed up with Dr. Lorenso Courier. Following discussion of pathology findings and treatment options, the patient agreed to Dr. Libby Maw plan of treatment consisting of  cisplatin 40 mg per metered squared in combination with radiation therapy. Expected start date is 07/20/21.  Pertinent imaging since the patient was last seen includes:  --MRI of the pelvis on 06/17/21 which demonstrated T stage disease as T3, and N stage of disease as N2a. Distance from tumor to the internal anal sphincter was seen to measure 4.7 cm. Of note: addendum report on MRI reiterated recent pathology results to show poorly differentiated carcinoma, which is not that of colorectal adenocarcinoma, but suspected primary gynecologic or peritoneal origin. Overall, cervical carcinoma is suspected clinically.    HISTORY OF PRESENT ILLNESS:: The patient presented to Dr. Delfina Redwood this past September with a 1 month history of unusual bowel habits described as small frequent stools and frequent bowel movements at night time. The patient also reported mucus in her stool, one episode of bloody stool, and lower abdominal pressure /distention. She was initially seen by her PCP in August of 2022; blood work performed at the time showed normal findings. CT of the abdomen and pelvis without contrast performed on 05/11/21 demonstrated a 7.5 x 6.9 enhancing mass located at the posterior pelvis concerning for colorectal malignancy as well as possible metastatic adenopathy  Accordingly, the patient was referred to Dr. Alessandra Bevels at [facility] on 05/20/21 for further evaluation. Recto-sigmoid biopsy performed on 05/24/21 by Dr. Alessandra Bevels revealed poorly differentiated high grade carcinoma.  The flexible sigmoidoscopy was poor due to poor bowel prep.  There was a large amount of solid stool in the rectum, rectosigmoid colon and distal sigmoid colon which interfered with visualization.  The scope could not be advanced beyond 20 to  25 cm.  A biopsy however was obtained from this procedure.  PET performed on 06/04/21 demonstrated the hypermetabolic mass adjacent to the rectum; noted as likely due to colorectal  primary neoplasm. An ill-defined hypermetabolic nodule was also seen more inferiorly in the left perirectal fat, noted as a likely lymph node metastasis. In addition, numerous new small bilateral solid pulmonary nodules were seen though were below resolution for PET/CT avidity (new compared to chest CT in 2015).  The patient was then referred to Dr. Lorenso Courier on 06/11/21 for further evaluation. The patient endorsed continued abdominal pain during this visit and some difficulty voiding. She reported continued thin stools and some difficulty sleeping as well.  Tumor markers were obtained and there was no elevation of CA125 , CEA or CA 19.9.  The patients case was discussed during  the GYn/OncTumor Board held on 06/14/21.  Pathologic findings were discussed and they were not consistent with a colorectal primary possibly cervical cancer.  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 Johney Maine yesterday with consultation notes pending at this time.  Preliminary interpretation was the patient may need preoperative radiation therapy prior to surgical resection of this mass.  Gynecologic history is significant for the patient having 3 spontaneous vaginal deliveries.   She reports undergoing "partial hysterectomy".  Her interpretation was the cervix and ovaries remained.  This hysterectomy was performed for abnormal uterine bleeding presumably fibroids.  She reports having some Pap smears after her hysterectomy and her interpretation was they were normal.   PREVIOUS RADIATION THERAPY: No  PAST MEDICAL HISTORY:  Past Medical History:  Diagnosis Date   CAP (community acquired pneumonia) 09/10/2013   Hypertension    Papilloma of left breast    Papilloma of left breast 02/26/2019   Perforated appendicitis 03/19/2013    PAST SURGICAL HISTORY: Past Surgical History:  Procedure Laterality Date   ABDOMINAL HYSTERECTOMY     BREAST EXCISIONAL BIOPSY Left  02/26/2019   B9 Biopsy   BREAST LUMPECTOMY WITH RADIOACTIVE SEED LOCALIZATION Left 02/26/2019   Procedure: LEFT BREAST LUMPECTOMY WITH RADIOACTIVE SEED LOCALIZATION;  Surgeon: Fanny Skates, MD;  Location: [place];  Service: General;  Laterality: Left;   LAPAROSCOPIC APPENDECTOMY N/A 02/25/2013   Procedure: APPENDECTOMY LAPAROSCOPIC;  Surgeon: Harl Bowie, MD;  Location: [place];  Service: General;  Laterality: N/A;   LAPAROSCOPY N/A 06/22/2021   Procedure: LAPAROSCOPY DIAGNOSTIC, DIVERTING LOOP COLOSTOMY, ANORECTAL EXAM UNDER ANESTHESIA, TRANSRECTAL CORE BIOPSIES.;  Surgeon: Lafonda Mosses, MD;  Location: [facility];  Service: Gynecology;  Laterality: N/A;    FAMILY HISTORY:  Family History  Problem Relation Age of Onset   Heart disease Father    Colon cancer Neg Hx    Breast cancer Neg Hx    Ovarian cancer Neg Hx    Pancreatic cancer Neg Hx    Prostate cancer Neg Hx    Endometrial cancer Neg Hx        Patient unaware    SOCIAL HISTORY:  Social History   Tobacco Use   Smoking status: Every Day    Packs/day: 1.00    Years: 40.00    Pack years: 40.00    Types: Cigarettes   Smokeless tobacco: Never  Vaping Use   Vaping Use: Never used  Substance Use Topics   Alcohol use: Yes    Comment: occ   Drug use: No    ALLERGIES:  Allergies  Allergen Reactions   Other Swelling  pneumonia vaccine   Pneumococcal Vac Polyvalent Other (See Comments)    MEDICATIONS:  Current Outpatient Medications  Medication Sig Dispense Refill   acetaminophen (TYLENOL) 500 MG tablet Take 500 mg by mouth every 6 (six) hours as needed.     albuterol (PROVENTIL HFA;VENTOLIN HFA) 108 (90 BASE) MCG/ACT inhaler Inhale 2 puffs into the lungs every 6 (six) hours as needed for wheezing or shortness of breath.     aspirin 81 MG tablet Take 81 mg by mouth daily.     lidocaine-prilocaine (EMLA) cream Apply 1 application topically as needed. 30 g 0   Multiple Vitamin  (MULTIVITAMIN) tablet Take 1 tablet by mouth daily.     Omega-3 Fatty Acids (FISH OIL PO) Take 1 capsule by mouth daily.     ondansetron (ZOFRAN) 8 MG tablet Take 1 tablet (8 mg total) by mouth every 8 (eight) hours as needed. 30 tablet 0   oxyCODONE (OXY IR/ROXICODONE) 5 MG immediate release tablet Take 1-2 tablets (5-10 mg total) by mouth every 6 (six) hours as needed for moderate pain, severe pain or breakthrough pain. 30 tablet 0   prochlorperazine (COMPAZINE) 10 MG tablet Take 1 tablet (10 mg total) by mouth every 6 (six) hours as needed for nausea or vomiting. 30 tablet 0   valsartan-hydrochlorothiazide (DIOVAN-HCT) 160-12.5 MG tablet Take 1 tablet by mouth daily.     vitamin C (ASCORBIC ACID) 500 MG tablet Take 500 mg by mouth daily.     No current facility-administered medications for this encounter.    REVIEW OF SYSTEMS: She reports that her pelvic pain has improved since placement of a  diverting loop colostomy.  She does report mucus drainage from her anal region since her surgery.  She denies any blood in this drainage.  PHYSICAL EXAM:  General: Alert and oriented, in no acute distress, accompanied by her husband.  Abdomen: Soft, nontender, nondistended, with no rigidity or guarding.  Colostomy in place no signs of erythema or surrounding the colostomy to suggest infection.   ECOG = 1  0 - Asymptomatic (Fully active, able to carry on all predisease activities without restriction)  1 - Symptomatic but completely ambulatory (Restricted in physically strenuous activity but ambulatory and able to carry out work of a light or sedentary nature. For example, light housework, office work)  2 - Symptomatic, <50% in bed during the day (Ambulatory and capable of all self care but unable to carry out any work activities. Up and about more than 50% of waking hours)  3 - Symptomatic, >50% in bed, but not bedbound (Capable of only limited self-care, confined to bed or chair 50% or more of  waking hours)  4 - Bedbound (Completely disabled. Cannot carry on any self-care. Totally confined to bed or chair)  5 - Death   Eustace Pen MM, Creech RH, Tormey DC, et al. [phone]). "Toxicity and response criteria of the [facility]". [place] Oncol. 5 (6): 649-55  LABORATORY DATA:  Lab Results  Component Value Date   WBC 8.8 07/08/2021   HGB 12.7 07/08/2021   HCT 38.0 07/08/2021   MCV 90.3 07/08/2021   PLT 426 (H) 07/08/2021   NEUTROABS 5.8 07/08/2021   Lab Results  Component Value Date   NA 139 07/08/2021   K 3.5 07/08/2021   CL 103 07/08/2021   CO2 26 07/08/2021   GLUCOSE 134 (H) 07/08/2021   CREATININE 0.71 07/08/2021   CALCIUM 9.7 07/08/2021      RADIOGRAPHY: MR PELVIS  WO CONTRAST  Addendum Date: 06/18/2021   ADDENDUM REPORT: 06/18/2021 08:32 ADDENDUM: Pathology results show poorly differentiated carcinoma, which is NOT that of colorectal adenocarcinoma, but suspected primary GYN or peritoneal origin. Cervical carcinoma is suspected clinically. The patient has undergone a supracervical hysterectomy, however the cervix is well seen and the tumor abuts its posterior wall, but does not appear to arise from, or definitely invade into, the cervix. This bulky tumor is centered squarely in the midline posterior pelvis, involving the rectum and cul-de-sac, and could be of peritoneal or tubovarian origin. This was discussed with Dr. Johney Maine by telephone on 06/18/2021. Electronically Signed   By: Marlaine Hind M.D.   On: 06/18/2021 08:32   Result Date: 06/18/2021 CLINICAL DATA:  Rectal carcinoma. EXAM: MRI PELVIS WITHOUT CONTRAST TECHNIQUE: Multiplanar multisequence MR imaging of the pelvis was performed. No intravenous contrast was administered. Ultrasound gel was administered per rectum to optimize tumor evaluation. COMPARISON:  CT on 05/11/2021 FINDINGS: TUMOR LOCATION Tumor distance from Anal Verge/Skin Surface:  8.4 cm Tumor distance to Internal Anal Sphincter: 4.7  cm TUMOR DESCRIPTION Circumferential Extent: 100% Tumor Length: Bulky tumor in the upper and mid rectum, with length of 8.9 cm; overall tumor dimensions are 8.7 x 6.3 x 7.2 cm T - CATEGORY Extension through Muscularis Propria: Yes = T3 Shortest Distance of any tumor/node from Mesorectal Fascia: 0 mm, tumor directly abuts the mesorectal fascia posteriorly Extramural Vascular Invasion/Tumor Thrombus: Yes (e.g. on Image 16/9) Invasion of Anterior Peritoneal Reflection: No Involvement of Adjacent Organs or Pelvic Sidewall: No Levator Ani Involvement: No N - CATEGORY Regional Lymph Nodes >=31m: Total of at least 6 (see series 8) =N2a. Largest lymph node in left posterior perirectal space measures 1.7 cm. Other:  Sigmoid diverticulosis, without evidence of diverticulitis. 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 Electronically Signed: By: JMarlaine HindM.D. 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  Her colostomy appears to be above anticipated radiation treatment area and therefore we can get started with radiation treatment in the near future.    PLAN:   Patient will receive approximately 6 weeks of radiation therapy directed at the pelvic mass.  She will also receive radiosensitizing cisplatin based chemotherapy.  She will meet with Dr. DLorenso Courierlater today to discuss this aspect of her treatment.  She will start her radiation therapy on July 19, 2021.   20 minutes of total time was spent for this patient encounter, including preparation, face-to-face counseling with the patient and coordination of care, physical exam, and documentation of the encounter.    ------------------------------------------------  JBlair Promise PhD, MD  This document serves as a record of services personally performed by JGery Pray MD. It was created on his behalf by ERoney Mans a trained medical scribe. The creation of this record is based on the scribe's personal observations and the provider's statements to them. This document has been checked and approved by the attending provider.

## 2021-07-08 NOTE — Progress Notes (Signed)
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 Findings showed a 7.5 x 6.9 cm heterogeneously enhancing mass is noted posteriorly in the pelvis anterior to the sacrum which is highly concerning for colorectal malignancy with possible adjacent metastatic adenopathy. 05/24/2021: colonoscopy performed by [facility]. Biopsy showed poorly differentiated high grade carcinoma, differential including tuboovarian and peritoneal. Immunophemotype not consistent with colorectal adenocarcinoma.  06/04/2021: PET CT scan performed, findings show hypermetabolic mass adjacent to the rectum, likely due to colorectal primary neoplasm and ill-defined hypermetabolic nodule is seen more inferiorly in the left perirectal fat, likely a lymph node metastasis. She was also noted to have Numerous small bilateral small solid pulmonary nodules which are below resolution for PET-CT 06/11/2021: establish care with Dr. Lorenso Courier  06/22/2021: Laparoscopic diagnostic procedure with diverting loop colostomy and anorectal exam under anesthesia.  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  Interval History:  Mariah Schultz 65 y.o. female with medical history significant for squamous cell cancer of unclear etiology who presents for  a follow up visit. The patient's last visit was on 06/11/2021. In the interim since the last visit she underwent a diverting colostomy with biopsy of the mass showing squamous cell carcinoma of unclear etiology.  On exam today Mariah Schultz is accompanied by her husband.  She reports that she does have some residual pain from the surgery but overall the ostomy is in place and is producing soft formed stools.  She notes he is not having any issues with nausea, vomiting, or diarrhea.  She does take oxycodone approximately 1 per night in order to try to help herself with pain.  She also been alternating this with Tylenol and ibuprofen.  Overall she is stable but discouraged by the fact that she has an ostomy that may not be reversed.  She currently denies any fevers, chills, sweats, nausea, ming or diarrhea.  Full 10 point ROS is listed below.  Both were discussion focused on the patient's diagnosis of squamous cell carcinoma of unclear etiology and treatment options moving forward.  The details of this conversation are listed below.  MEDICAL HISTORY:  Past Medical History:  Diagnosis Date   CAP (community acquired pneumonia) 09/10/2013   Hypertension    Papilloma of left breast    Papilloma of left breast 02/26/2019   Perforated appendicitis 03/19/2013    SURGICAL HISTORY: Past Surgical History:  Procedure Laterality Date   ABDOMINAL HYSTERECTOMY     BREAST EXCISIONAL BIOPSY Left 02/26/2019   B9 Biopsy   BREAST LUMPECTOMY WITH RADIOACTIVE SEED LOCALIZATION Left 02/26/2019   Procedure: LEFT BREAST LUMPECTOMY WITH RADIOACTIVE SEED LOCALIZATION;  Surgeon: Fanny Skates, MD;  Location: [facility];  Service: General;  Laterality: Left;   LAPAROSCOPIC APPENDECTOMY N/A 02/25/2013   Procedure: APPENDECTOMY LAPAROSCOPIC;  Surgeon: Harl Bowie, MD;  Location: [facility];  Service: General;  Laterality: N/A;   LAPAROSCOPY N/A 06/22/2021   Procedure: LAPAROSCOPY DIAGNOSTIC, DIVERTING LOOP  COLOSTOMY,  ANORECTAL EXAM UNDER ANESTHESIA, TRANSRECTAL CORE BIOPSIES.;  Surgeon: Lafonda Mosses, MD;  Location: [facility];  Service: Gynecology;  Laterality: N/A;    SOCIAL HISTORY: Social History   Socioeconomic History   Marital status: Married    Spouse name: Not on file   Number of children: Not on file   Years of education: Not on file   Highest education level: Not on file  Occupational History   Not on file  Tobacco Use   Smoking status: Every Day    Packs/day: 1.00    Years: 40.00    Pack years: 40.00    Types: Cigarettes   Smokeless tobacco: Never  Vaping Use   Vaping Use: Never used  Substance and Sexual Activity   Alcohol use: Yes    Comment: occ   Drug use: No   Sexual activity: Not Currently    Birth control/protection: Surgical  Other Topics Concern   Not on file  Social History Narrative   Not on file   Social Determinants of Health   Financial Resource Strain: Low Risk    Difficulty of Paying Living Expenses: Not very hard  Food Insecurity: No Food Insecurity   Worried About Running Out of Food in the Last Year: Never true   Ran Out of Food in the Last Year: Never true  Transportation Needs: No Transportation Needs   Lack of Transportation (Medical): No   Lack of Transportation (Non-Medical): No  Physical Activity: Not on file  Stress: Not on file  Social Connections: Not on file  Intimate Partner Violence: Not on file    FAMILY HISTORY: Family History  Problem Relation Age of Onset   Heart disease Father    Colon cancer Neg Hx    Breast cancer Neg Hx    Ovarian cancer Neg Hx    Pancreatic cancer Neg Hx    Prostate cancer Neg Hx    Endometrial cancer Neg Hx        Patient unaware    ALLERGIES:  is allergic to other and pneumococcal vac polyvalent.  MEDICATIONS:  Current Outpatient Medications  Medication Sig Dispense Refill   lidocaine-prilocaine (EMLA) cream Apply 1 application topically as needed. 30 g 0   ondansetron (ZOFRAN)  8 MG tablet Take 1 tablet (8 mg total) by mouth every 8 (eight) hours as needed. 30 tablet 0   prochlorperazine (COMPAZINE) 10 MG tablet Take 1 tablet (10 mg total) by mouth every 6 (six) hours as needed for nausea or vomiting. 30 tablet 0   acetaminophen (TYLENOL) 500 MG tablet Take 500 mg by mouth every 6 (six) hours as needed.     albuterol (PROVENTIL HFA;VENTOLIN HFA) 108 (90 BASE) MCG/ACT inhaler Inhale 2 puffs into the lungs every 6 (six) hours as needed for wheezing or shortness of breath.     aspirin 81 MG tablet Take 81 mg by mouth daily.     Multiple Vitamin (MULTIVITAMIN) tablet Take 1 tablet by mouth daily.     Omega-3 Fatty Acids (FISH OIL PO) Take 1 capsule by mouth daily.     oxyCODONE (OXY IR/ROXICODONE) 5 MG immediate release tablet Take 1-2 tablets (5-10 mg total) by mouth every 6 (six) hours as needed for moderate pain, severe pain or breakthrough pain. 30 tablet 0   valsartan-hydrochlorothiazide (DIOVAN-HCT) 160-12.5 MG tablet Take 1 tablet by mouth daily.     vitamin C (ASCORBIC ACID) 500 MG tablet Take 500 mg by mouth daily.     No current facility-administered  medications for this visit.    REVIEW OF SYSTEMS:   Constitutional: ( - ) fevers, ( - )  chills , ( - ) night sweats Eyes: ( - ) blurriness of vision, ( - ) double vision, ( - ) watery eyes Ears, nose, mouth, throat, and face: ( - ) mucositis, ( - ) sore throat Respiratory: ( - ) cough, ( - ) dyspnea, ( - ) wheezes Cardiovascular: ( - ) palpitation, ( - ) chest discomfort, ( - ) lower extremity swelling Gastrointestinal:  ( - ) nausea, ( - ) heartburn, ( - ) change in bowel habits Skin: ( - ) abnormal skin rashes Lymphatics: ( - ) new lymphadenopathy, ( - ) easy bruising Neurological: ( - ) numbness, ( - ) tingling, ( - ) new weaknesses Behavioral/Psych: ( - ) mood change, ( - ) new changes  All other systems were reviewed with the patient and are negative.  PHYSICAL EXAMINATION: ECOG PERFORMANCE STATUS: 1 -  Symptomatic but completely ambulatory  Vitals:   07/08/21 1437  BP: 133/80  Pulse: 77  Resp: 17  Temp: 98.3 F (36.8 C)  SpO2: 98%   Filed Weights   07/08/21 1437  Weight: 164 lb 3.2 oz (74.5 kg)    GENERAL: Well-appearing middle-aged African-American female, alert, no distress and comfortable SKIN: skin color, texture, turgor are normal, no rashes or significant lesions EYES: conjunctiva are pink and non-injected, sclera clear LUNGS: clear to auscultation and percussion with normal breathing effort HEART: regular rate & rhythm and no murmurs and no lower extremity edema ABDOMEN: Ostomy in place producing soft stool.  Soft, non-tender, non-distended, normal bowel sounds Musculoskeletal: no cyanosis of digits and no clubbing  PSYCH: alert & oriented x 3, fluent speech NEURO: no focal motor/sensory deficits  LABORATORY DATA:  I have reviewed the data as listed CBC Latest Ref Rng & Units 07/08/2021 06/25/2021 06/23/2021  WBC 4.0 - 10.5 K/uL 8.8 8.3 17.2(H)  Hemoglobin 12.0 - 15.0 g/dL 12.7 11.7(L) 13.0  Hematocrit 36.0 - 46.0 % 38.0 34.8(L) 39.0  Platelets 150 - 400 K/uL 426(H) 265 309    CMP Latest Ref Rng & Units 07/08/2021 06/25/2021 06/23/2021  Glucose 70 - 99 mg/dL 134(H) - 127(H)  BUN 8 - 23 mg/dL 16 - 8  Creatinine 0.44 - 1.00 mg/dL 0.71 0.61 0.73  Sodium 135 - 145 mmol/L 139 - 137  Potassium 3.5 - 5.1 mmol/L 3.5 3.0(L) 3.8  Chloride 98 - 111 mmol/L 103 - 103  CO2 22 - 32 mmol/L 26 - 22  Calcium 8.9 - 10.3 mg/dL 9.7 - 8.8(L)  Total Protein 6.5 - 8.1 g/dL 7.7 - -  Total Bilirubin 0.3 - 1.2 mg/dL 0.3 - -  Alkaline Phos 38 - 126 U/L 130(H) - -  AST 15 - 41 U/L 19 - -  ALT 0 - 44 U/L 15 - -    RADIOGRAPHIC STUDIES: MR PELVIS WO CONTRAST  Addendum Date: 06/18/2021   ADDENDUM REPORT: 06/18/2021 08:32 ADDENDUM: Pathology results show poorly differentiated carcinoma, which is NOT that of colorectal adenocarcinoma, but suspected primary GYN or peritoneal origin.  Cervical carcinoma is suspected clinically. The patient has undergone a supracervical hysterectomy, however the cervix is well seen and the tumor abuts its posterior wall, but does not appear to arise from, or definitely invade into, the cervix. This bulky tumor is centered squarely in the midline posterior pelvis, involving the rectum and cul-de-sac, and could be of peritoneal or tubovarian origin. This was discussed  with Dr. Johney Maine by telephone on 06/18/2021. Electronically Signed   By: Marlaine Hind M.D.   On: 06/18/2021 08:32   Result Date: 06/18/2021 CLINICAL DATA:  Rectal carcinoma. EXAM: MRI PELVIS WITHOUT CONTRAST TECHNIQUE: Multiplanar multisequence MR imaging of the pelvis was performed. No intravenous contrast was administered. Ultrasound gel was administered per rectum to optimize tumor evaluation. COMPARISON:  CT on 05/11/2021 FINDINGS: TUMOR LOCATION Tumor distance from Anal Verge/Skin Surface:  8.4 cm Tumor distance to Internal Anal Sphincter: 4.7 cm TUMOR DESCRIPTION Circumferential Extent: 100% Tumor Length: Bulky tumor in the upper and mid rectum, with length of 8.9 cm; overall tumor dimensions are 8.7 x 6.3 x 7.2 cm T - CATEGORY Extension through Muscularis Propria: Yes = T3 Shortest Distance of any tumor/node from Mesorectal Fascia: 0 mm, tumor directly abuts the mesorectal fascia posteriorly Extramural Vascular Invasion/Tumor Thrombus: Yes (e.g. on Image 16/9) Invasion of Anterior Peritoneal Reflection: No Involvement of Adjacent Organs or Pelvic Sidewall: No Levator Ani Involvement: No N - CATEGORY Regional Lymph Nodes >=49mm: Total of at least 6 (see series 8) =N2a. Largest lymph node in left posterior perirectal space measures 1.7 cm. Other:  Sigmoid diverticulosis, without evidence of diverticulitis. 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 Electronically Signed: By: Marlaine Hind M.D. On:  06/17/2021 15:17    ASSESSMENT & PLAN Mariah Schultz 65 y.o. female with medical history significant for squamous cell cancer of unclear etiology who presents for a follow up visit.   Today we discussed the findings on pathology and the steps moving forward.  At this time this appears to be a squamous cell carcinoma of unclear etiology.  Based on the radiographic review and the surgical procedure there is no clear etiology for this patient's cancer.  The patient underwent a loop colostomy with biopsy of the lesion.  At this time I recommend that we pursue chemoradiation with cisplatin 40 mg per metered squared and combination with radiation therapy.  We will continue cisplatin for the duration of radiation.  The patient voiced understanding of this plan moving forward.  We will plan to start on 07/20/2021 with concurrent radiation.  # Poorly Differentiated High Grade Carcinoma-squamous cell carcinoma of unclear etiology --Unrevealing tumor markers show no elevations in CEA, CA 19-9, CA125 -- Pathology consistent with a squamous cell carcinoma of unclear etiology --Case has been discussed extensively at GYN multidisciplinary conferences -- Recommend pursuing combination chemotherapy and radiation --Recommend using cisplatin 40 mg per metered squared weekly while receiving radiation as potentiating agent --Patient is currently scheduled for simulation scan on 07/19/2021 with start of radiation therapy on 07/20/2021.  We will plan to start chemotherapy at that time.  #Supportive Care -- chemotherapy education to be scheduled  -- port placement to be scheduled.  -- zofran 8mg  q8H PRN and compazine 10mg  PO q6H for nausea -- EMLA cream for port -- pain medication as above   Orders Placed This Encounter  Procedures   IR IMAGING GUIDED PORT INSERTION    Requesting port placement prior to 07/19/2021.    Standing Status:   Future    Standing Expiration Date:   07/08/2022    Order Specific  Question:   Reason for Exam (SYMPTOM  OR DIAGNOSIS REQUIRED)    Answer:   patient starting chemotherapy, has poor veins.    Order Specific Question:   Preferred Imaging Location?    Answer:   [place]  [facility]    Order Specific Question:   Release to patient    Answer:   Immediate    All questions were answered. The patient knows to call the clinic with any problems, questions or concerns.  A total of more than 30 minutes were spent on this encounter with face-to-face time and non-face-to-face time, including preparing to see the patient, ordering tests and/or medications, counseling the patient and coordination of care as outlined above.   Ledell Peoples, MD Department of Hematology/Oncology [place] at [facility] Phone: [phone] Pager: [phone] Email: [email or web address]  07/08/2021 4:17 PM

## 2021-07-09 ENCOUNTER — Ambulatory Visit: Payer: Medicare HMO

## 2021-07-12 ENCOUNTER — Ambulatory Visit: Payer: Medicare HMO

## 2021-07-12 ENCOUNTER — Inpatient Hospital Stay (HOSPITAL_BASED_OUTPATIENT_CLINIC_OR_DEPARTMENT_OTHER): Payer: Medicare HMO | Admitting: Gynecologic Oncology

## 2021-07-12 ENCOUNTER — Encounter: Payer: Self-pay | Admitting: Gynecologic Oncology

## 2021-07-12 ENCOUNTER — Other Ambulatory Visit: Payer: Self-pay

## 2021-07-12 VITALS — BP 118/76 | HR 86 | Temp 98.7°F | Resp 16 | Ht 64.0 in | Wt 163.0 lb

## 2021-07-12 DIAGNOSIS — C763 Malignant neoplasm of pelvis: Secondary | ICD-10-CM

## 2021-07-12 DIAGNOSIS — F17210 Nicotine dependence, cigarettes, uncomplicated: Secondary | ICD-10-CM | POA: Diagnosis not present

## 2021-07-12 DIAGNOSIS — Z9889 Other specified postprocedural states: Secondary | ICD-10-CM

## 2021-07-12 DIAGNOSIS — U071 COVID-19: Secondary | ICD-10-CM | POA: Diagnosis not present

## 2021-07-12 DIAGNOSIS — Z483 Aftercare following surgery for neoplasm: Secondary | ICD-10-CM | POA: Diagnosis not present

## 2021-07-12 DIAGNOSIS — Z433 Encounter for attention to colostomy: Secondary | ICD-10-CM | POA: Diagnosis not present

## 2021-07-12 DIAGNOSIS — I10 Essential (primary) hypertension: Secondary | ICD-10-CM | POA: Diagnosis not present

## 2021-07-12 DIAGNOSIS — R918 Other nonspecific abnormal finding of lung field: Secondary | ICD-10-CM | POA: Diagnosis not present

## 2021-07-12 DIAGNOSIS — C20 Malignant neoplasm of rectum: Secondary | ICD-10-CM | POA: Diagnosis not present

## 2021-07-12 DIAGNOSIS — K56600 Partial intestinal obstruction, unspecified as to cause: Secondary | ICD-10-CM | POA: Diagnosis not present

## 2021-07-12 DIAGNOSIS — E876 Hypokalemia: Secondary | ICD-10-CM | POA: Diagnosis not present

## 2021-07-12 NOTE — Patient Instructions (Signed)
You are healing well from surgery! Please don't hesitate to reach out if you need anything

## 2021-07-12 NOTE — Progress Notes (Signed)
Gynecologic Oncology Return Clinic Visit  07/12/21  Reason for Visit: 07/12/2021  Treatment History: 06/22/2021: Patient underwent exam under anesthesia, diagnostic laparoscopy, transrectal biopsies of presacral mass, and diverting loop colostomy.  Interval History: Patient presents today for follow-up after recent combo surgery with Dr. Johney Maine.  She notes doing well.  She feels that she has had some more pain in her deep pelvis related to her tumor.  Her appetite has been so-so.  She has some nausea if she tries to eat too much.  She has been doing smaller meals as well as drinking nutritional supplements such as Ensure and boost shakes.  She denies any emesis.  She is having increased bowel function from her ostomy and having to empty her ostomy bag 3 times a day.  She also notes some ongoing mucus drainage from her rectum.  She denies any urinary symptoms.  Past Medical/Surgical History: Past Medical History:  Diagnosis Date   CAP (community acquired pneumonia) 09/10/2013   Hypertension    Papilloma of left breast    Papilloma of left breast 02/26/2019   Perforated appendicitis 03/19/2013    Past Surgical History:  Procedure Laterality Date   ABDOMINAL HYSTERECTOMY     BREAST EXCISIONAL BIOPSY Left 02/26/2019   B9 Biopsy   BREAST LUMPECTOMY WITH RADIOACTIVE SEED LOCALIZATION Left 02/26/2019   Procedure: LEFT BREAST LUMPECTOMY WITH RADIOACTIVE SEED LOCALIZATION;  Surgeon: Fanny Skates, MD;  Location: [facility];  Service: General;  Laterality: Left;   LAPAROSCOPIC APPENDECTOMY N/A 02/25/2013   Procedure: APPENDECTOMY LAPAROSCOPIC;  Surgeon: Harl Bowie, MD;  Location: [facility];  Service: General;  Laterality: N/A;   LAPAROSCOPY N/A 06/22/2021   Procedure: LAPAROSCOPY DIAGNOSTIC, DIVERTING LOOP COLOSTOMY, ANORECTAL EXAM UNDER ANESTHESIA, TRANSRECTAL CORE BIOPSIES.;  Surgeon: Lafonda Mosses, MD;  Location: [facility];  Service: Gynecology;  Laterality: N/A;     Family History  Problem Relation Age of Onset   Heart disease Father    Colon cancer Neg Hx    Breast cancer Neg Hx    Ovarian cancer Neg Hx    Pancreatic cancer Neg Hx    Prostate cancer Neg Hx    Endometrial cancer Neg Hx        Patient unaware    Social History   Socioeconomic History   Marital status: Married    Spouse name: Not on file   Number of children: Not on file   Years of education: Not on file   Highest education level: Not on file  Occupational History   Not on file  Tobacco Use   Smoking status: Every Day    Packs/day: 1.00    Years: 40.00    Pack years: 40.00    Types: Cigarettes   Smokeless tobacco: Never  Vaping Use   Vaping Use: Never used  Substance and Sexual Activity   Alcohol use: Yes    Comment: occ   Drug use: No   Sexual activity: Not Currently    Birth control/protection: Surgical  Other Topics Concern   Not on file  Social History Narrative   Not on file   Social Determinants of Health   Financial Resource Strain: Low Risk    Difficulty of Paying Living Expenses: Not very hard  Food Insecurity: No Food Insecurity   Worried About Running Out of Food in the Last Year: Never true   Ran Out of Food in the Last Year: Never true  Transportation Needs: No Transportation Needs   Lack of Transportation (  Medical): No   Lack of Transportation (Non-Medical): No  Physical Activity: Not on file  Stress: Not on file  Social Connections: Not on file    Current Medications:  Current Outpatient Medications:    acetaminophen (TYLENOL) 500 MG tablet, Take 500 mg by mouth every 6 (six) hours as needed., Disp: , Rfl:    aspirin 81 MG tablet, Take 81 mg by mouth daily., Disp: , Rfl:    Multiple Vitamin (MULTIVITAMIN) tablet, Take 1 tablet by mouth daily., Disp: , Rfl:    Omega-3 Fatty Acids (FISH OIL PO), Take 1 capsule by mouth daily., Disp: , Rfl:    oxyCODONE (OXY IR/ROXICODONE) 5 MG immediate release tablet, Take 1-2 tablets (5-10 mg  total) by mouth every 6 (six) hours as needed for moderate pain, severe pain or breakthrough pain., Disp: 30 tablet, Rfl: 0   valsartan-hydrochlorothiazide (DIOVAN-HCT) 160-12.5 MG tablet, Take 1 tablet by mouth daily., Disp: , Rfl:    vitamin C (ASCORBIC ACID) 500 MG tablet, Take 500 mg by mouth daily., Disp: , Rfl:    albuterol (PROVENTIL HFA;VENTOLIN HFA) 108 (90 BASE) MCG/ACT inhaler, Inhale 2 puffs into the lungs every 6 (six) hours as needed for wheezing or shortness of breath. (Patient not taking: Reported on 07/09/2021), Disp: , Rfl:    lidocaine-prilocaine (EMLA) cream, Apply 1 application topically as needed. (Patient not taking: Reported on 07/09/2021), Disp: 30 g, Rfl: 0   ondansetron (ZOFRAN) 8 MG tablet, Take 1 tablet (8 mg total) by mouth every 8 (eight) hours as needed. (Patient not taking: Reported on 07/09/2021), Disp: 30 tablet, Rfl: 0   prochlorperazine (COMPAZINE) 10 MG tablet, Take 1 tablet (10 mg total) by mouth every 6 (six) hours as needed for nausea or vomiting., Disp: 30 tablet, Rfl: 0  Review of Systems: Denies appetite changes, fevers, chills, fatigue, unexplained weight changes. Denies hearing loss, neck lumps or masses, mouth sores, ringing in ears or voice changes. Denies cough or wheezing.  Denies shortness of breath. Denies chest pain or palpitations. Denies leg swelling. Denies abdominal distention, pain, blood in stools, constipation, diarrhea, nausea, vomiting, or early satiety. Denies pain with intercourse, dysuria, frequency, hematuria or incontinence. Denies hot flashes, pelvic pain, vaginal bleeding or vaginal discharge.   Denies joint pain, back pain or muscle pain/cramps. Denies itching, rash, or wounds. Denies dizziness, headaches, numbness or seizures. Denies swollen lymph nodes or glands, denies easy bruising or bleeding. Denies anxiety, depression, confusion, or decreased concentration.  Physical Exam: BP 118/76 (BP Location: Left Arm, Patient  Position: Sitting)   Pulse 86   Temp 98.7 F (37.1 C) (Oral)   Resp 16   Ht 5\' 4"  (1.626 m)   Wt 163 lb (73.9 kg)   SpO2 100%   BMI 27.98 kg/m  General: Alert, oriented, no acute distress. HEENT: Normocephalic, atraumatic, sclera anicteric. Chest: Clear to auscultation bilaterally.  Unlabored breathing on room air. Abdomen: soft, mildly distended, nontender.  Normoactive bowel sounds.  No masses or hepatosplenomegaly appreciated.  Well healed lsc incisions. Ostomy with bag in place, pink and viable. Extremities: Grossly normal range of motion.  Warm, well perfused.  No edema bilaterally.  Laboratory & Radiologic Studies: None new  Assessment & Plan: Mariah Schultz is a 65 y.o. 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  The patient is doing well from a postoperative standpoint. She is struggling  some with skin breakdown from her ostomy. She has worked with home health to minimize irritation to surrounding skin. She sees Dr. Johney Maine on Monday.  Given multiple multidisciplinary reviews, plan is to proceed with primary chemoradiation. She gets her port and starts radiation next week.   28 minutes of total time was spent for this patient encounter, including preparation, face-to-face counseling with the patient and coordination of care, and documentation of the encounter.  Jeral Pinch, MD  Division of Gynecologic Oncology  Department of Obstetrics and Gynecology  [facility]

## 2021-07-13 ENCOUNTER — Ambulatory Visit: Payer: Medicare HMO

## 2021-07-13 ENCOUNTER — Other Ambulatory Visit: Payer: Self-pay | Admitting: Hematology and Oncology

## 2021-07-13 NOTE — Progress Notes (Signed)
START OFF PATHWAY REGIMEN - Other   [id number]:Cisplatin 40 mg/m2 IV D1 q7 Days + RT:   A cycle is every 7 days:     Cisplatin   **Always confirm dose/schedule in your pharmacy ordering system**  Patient Characteristics: Intent of Therapy: Curative Intent, Discussed with Patient 

## 2021-07-14 ENCOUNTER — Ambulatory Visit: Payer: Medicare HMO

## 2021-07-14 DIAGNOSIS — C763 Malignant neoplasm of pelvis: Secondary | ICD-10-CM | POA: Diagnosis not present

## 2021-07-15 ENCOUNTER — Ambulatory Visit: Payer: Medicare HMO

## 2021-07-15 DIAGNOSIS — Z433 Encounter for attention to colostomy: Secondary | ICD-10-CM | POA: Diagnosis not present

## 2021-07-15 DIAGNOSIS — K56600 Partial intestinal obstruction, unspecified as to cause: Secondary | ICD-10-CM | POA: Diagnosis not present

## 2021-07-15 DIAGNOSIS — U071 COVID-19: Secondary | ICD-10-CM | POA: Diagnosis not present

## 2021-07-15 DIAGNOSIS — I10 Essential (primary) hypertension: Secondary | ICD-10-CM | POA: Diagnosis not present

## 2021-07-15 DIAGNOSIS — F17210 Nicotine dependence, cigarettes, uncomplicated: Secondary | ICD-10-CM | POA: Diagnosis not present

## 2021-07-15 DIAGNOSIS — R918 Other nonspecific abnormal finding of lung field: Secondary | ICD-10-CM | POA: Diagnosis not present

## 2021-07-15 DIAGNOSIS — Z483 Aftercare following surgery for neoplasm: Secondary | ICD-10-CM | POA: Diagnosis not present

## 2021-07-15 DIAGNOSIS — C20 Malignant neoplasm of rectum: Secondary | ICD-10-CM | POA: Diagnosis not present

## 2021-07-15 DIAGNOSIS — E876 Hypokalemia: Secondary | ICD-10-CM | POA: Diagnosis not present

## 2021-07-16 ENCOUNTER — Inpatient Hospital Stay: Payer: Medicare HMO

## 2021-07-16 ENCOUNTER — Ambulatory Visit: Payer: Medicare HMO

## 2021-07-16 ENCOUNTER — Other Ambulatory Visit: Payer: Self-pay

## 2021-07-19 ENCOUNTER — Ambulatory Visit: Payer: Medicare HMO

## 2021-07-19 ENCOUNTER — Inpatient Hospital Stay: Payer: Medicare HMO | Admitting: Hematology and Oncology

## 2021-07-19 ENCOUNTER — Inpatient Hospital Stay: Payer: Medicare HMO

## 2021-07-19 ENCOUNTER — Ambulatory Visit
Admission: RE | Admit: 2021-07-19 | Discharge: 2021-07-19 | Disposition: A | Discharge status: Home / Self Care | Payer: Medicare HMO | Source: Ambulatory Visit | Attending: Radiation Oncology | Admitting: Radiation Oncology

## 2021-07-19 ENCOUNTER — Other Ambulatory Visit: Payer: Self-pay

## 2021-07-19 ENCOUNTER — Other Ambulatory Visit: Payer: Self-pay | Admitting: Hematology and Oncology

## 2021-07-19 VITALS — BP 136/93 | HR 95 | Temp 98.3°F | Resp 18 | Wt 160.9 lb

## 2021-07-19 DIAGNOSIS — K6289 Other specified diseases of anus and rectum: Secondary | ICD-10-CM | POA: Diagnosis not present

## 2021-07-19 DIAGNOSIS — C763 Malignant neoplasm of pelvis: Secondary | ICD-10-CM | POA: Diagnosis not present

## 2021-07-19 LAB — CBC WITH DIFFERENTIAL (CANCER CENTER ONLY)
Abs Immature Granulocytes: 0.02 10*3/uL (ref 0.00–0.07)
Basophils Absolute: 0 10*3/uL (ref 0.0–0.1)
Basophils Relative: 0 %
Eosinophils Absolute: 0.5 10*3/uL (ref 0.0–0.5)
Eosinophils Relative: 6 %
HCT: 39.3 % (ref 36.0–46.0)
Hemoglobin: 13.5 g/dL (ref 12.0–15.0)
Immature Granulocytes: 0 %
Lymphocytes Relative: 14 %
Lymphs Abs: 1.4 10*3/uL (ref 0.7–4.0)
MCH: 30.3 pg (ref 26.0–34.0)
MCHC: 34.4 g/dL (ref 30.0–36.0)
MCV: 88.3 fL (ref 80.0–100.0)
Monocytes Absolute: 0.7 10*3/uL (ref 0.1–1.0)
Monocytes Relative: 7 %
Neutro Abs: 7.2 10*3/uL (ref 1.7–7.7)
Neutrophils Relative %: 73 %
Platelet Count: 410 10*3/uL — ABNORMAL HIGH (ref 150–400)
RBC: 4.45 MIL/uL (ref 3.87–5.11)
RDW: 13.6 % (ref 11.5–15.5)
WBC Count: 9.9 10*3/uL (ref 4.0–10.5)
nRBC: 0 % (ref 0.0–0.2)

## 2021-07-19 LAB — CMP (CANCER CENTER ONLY)
ALT: 13 U/L (ref 0–44)
AST: 19 U/L (ref 15–41)
Albumin: 3.6 g/dL (ref 3.5–5.0)
Alkaline Phosphatase: 128 U/L — ABNORMAL HIGH (ref 38–126)
Anion gap: 12 (ref 5–15)
BUN: 13 mg/dL (ref 8–23)
CO2: 25 mmol/L (ref 22–32)
Calcium: 10 mg/dL (ref 8.9–10.3)
Chloride: 102 mmol/L (ref 98–111)
Creatinine: 0.75 mg/dL (ref 0.44–1.00)
GFR, Estimated: >60 mL/min (ref >60)
Glucose, Bld: 138 mg/dL — ABNORMAL HIGH (ref 70–99)
Potassium: 3.8 mmol/L (ref 3.5–5.1)
Sodium: 139 mmol/L (ref 135–145)
Total Bilirubin: 0.3 mg/dL (ref 0.3–1.2)
Total Protein: 8.2 g/dL — ABNORMAL HIGH (ref 6.5–8.1)

## 2021-07-19 MED ORDER — OXYCODONE HCL 5 MG PO TABS: 5.0000 mg | ORAL_TABLET | Freq: Four times a day (QID) | ORAL | 0 refills | Status: DC | PRN

## 2021-07-19 NOTE — Progress Notes (Signed)
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 Findings showed a 7.5 x 6.9 cm heterogeneously enhancing mass is noted posteriorly in the pelvis anterior to the sacrum which is highly concerning for colorectal malignancy with possible adjacent metastatic adenopathy. 05/24/2021: colonoscopy performed by [facility]. Biopsy showed poorly differentiated high grade carcinoma, differential including tuboovarian and peritoneal. Immunophemotype not consistent with colorectal adenocarcinoma.  06/04/2021: PET CT scan performed, findings show hypermetabolic mass adjacent to the rectum, likely due to colorectal primary neoplasm and ill-defined hypermetabolic nodule is seen more inferiorly in the left perirectal fat, likely a lymph node metastasis. She was also noted to have Numerous small bilateral small solid pulmonary nodules which are below resolution for PET-CT 06/11/2021: establish care with Dr. Lorenso Courier  06/22/2021: Laparoscopic diagnostic procedure with diverting loop colostomy and anorectal exam under anesthesia.  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 07/19/2021: Start of Radiation therapy.  07/20/2021: Week 1 of Cisplatin 40mg /m2  Interval History:  Mariah Schultz 65 y.o. female with  medical history significant for squamous cell cancer of unclear etiology who presents for a follow up visit. The patient's last visit was on 07/13/2021. In the interim since the last visit she completed chemotherapy education and started radiation therapy this morning.  On exam today Mariah Schultz is accompanied by her husband.  She reports she is having pain in her back and her bottom.  She believes that this may be due to pressure from her mass, though this is a chronic issue.  She unfortunately has continued to lose weight and is down to 160 pounds, down 4 pounds from the beginning of the month.  She is not currently having any issues with p.o. intake.  She currently denies any fevers, chills, sweats, nausea, vomiting or diarrhea.  Full 10 point ROS is listed below.  Today we reviewed any last-minute questions she may have regarding starting chemotherapy treatment.  She is scheduled to start tomorrow at [facility] with her first dose of concurrent cisplatin.   MEDICAL HISTORY:  Past Medical History:  Diagnosis Date   CAP (community acquired pneumonia) 09/10/2013   Hypertension    Papilloma of left breast    Papilloma of left breast 02/26/2019   Perforated appendicitis 03/19/2013    SURGICAL HISTORY: Past Surgical History:  Procedure Laterality Date   ABDOMINAL HYSTERECTOMY     BREAST EXCISIONAL BIOPSY Left 02/26/2019   B9 Biopsy   BREAST LUMPECTOMY WITH RADIOACTIVE SEED LOCALIZATION Left 02/26/2019   Procedure: LEFT BREAST LUMPECTOMY WITH RADIOACTIVE SEED LOCALIZATION;  Surgeon: Fanny Skates, MD;  Location: [facility];  Service: General;  Laterality: Left;   LAPAROSCOPIC APPENDECTOMY N/A 02/25/2013   Procedure: APPENDECTOMY LAPAROSCOPIC;  Surgeon: Harl Bowie, MD;  Location: [facility];  Service: General;  Laterality: N/A;   LAPAROSCOPY N/A 06/22/2021   Procedure: LAPAROSCOPY DIAGNOSTIC, DIVERTING LOOP COLOSTOMY, ANORECTAL EXAM UNDER ANESTHESIA, TRANSRECTAL CORE  BIOPSIES.;  Surgeon: Lafonda Mosses, MD;  Location:  [facility];  Service: Gynecology;  Laterality: N/A;    SOCIAL HISTORY: Social History   Socioeconomic History   Marital status: Married    Spouse name: Not on file   Number of children: Not on file   Years of education: Not on file   Highest education level: Not on file  Occupational History   Not on file  Tobacco Use   Smoking status: Every Day    Packs/day: 1.00    Years: 40.00    Pack years: 40.00    Types: Cigarettes   Smokeless tobacco: Never  Vaping Use   Vaping Use: Never used  Substance and Sexual Activity   Alcohol use: Yes    Comment: occ   Drug use: No   Sexual activity: Not Currently    Birth control/protection: Surgical  Other Topics Concern   Not on file  Social History Narrative   Not on file   Social Determinants of Health   Financial Resource Strain: Low Risk    Difficulty of Paying Living Expenses: Not very hard  Food Insecurity: No Food Insecurity   Worried About Running Out of Food in the Last Year: Never true   Ran Out of Food in the Last Year: Never true  Transportation Needs: No Transportation Needs   Lack of Transportation (Medical): No   Lack of Transportation (Non-Medical): No  Physical Activity: Not on file  Stress: Not on file  Social Connections: Not on file  Intimate Partner Violence: Not on file    FAMILY HISTORY: Family History  Problem Relation Age of Onset   Heart disease Father    Colon cancer Neg Hx    Breast cancer Neg Hx    Ovarian cancer Neg Hx    Pancreatic cancer Neg Hx    Prostate cancer Neg Hx    Endometrial cancer Neg Hx        Patient unaware    ALLERGIES:  is allergic to other and pneumococcal vac polyvalent.  MEDICATIONS:  Current Outpatient Medications  Medication Sig Dispense Refill   acetaminophen (TYLENOL) 500 MG tablet Take 500 mg by mouth every 6 (six) hours as needed.     albuterol (PROVENTIL HFA;VENTOLIN HFA) 108 (90 BASE) MCG/ACT inhaler  Inhale 2 puffs into the lungs every 6 (six) hours as needed for wheezing or shortness of breath.     aspirin 81 MG tablet Take 81 mg by mouth daily.     lidocaine-prilocaine (EMLA) cream Apply 1 application topically as needed. 30 g 0   Multiple Vitamin (MULTIVITAMIN) tablet Take 1 tablet by mouth daily.     Omega-3 Fatty Acids (FISH OIL PO) Take 1 capsule by mouth daily.     ondansetron (ZOFRAN) 8 MG tablet Take 1 tablet (8 mg total) by mouth every 8 (eight) hours as needed. 30 tablet 0   prochlorperazine (COMPAZINE) 10 MG tablet Take 1 tablet (10 mg total) by mouth every 6 (six) hours as needed for nausea or vomiting. 30 tablet 0   valsartan-hydrochlorothiazide (DIOVAN-HCT) 160-12.5 MG tablet Take 1 tablet by mouth daily.     vitamin C (ASCORBIC ACID) 500 MG tablet Take 500 mg by mouth daily.     oxyCODONE (OXY IR/ROXICODONE) 5 MG immediate release tablet Take 1-2 tablets (5-10 mg total) by mouth every 6 (six) hours as needed for moderate pain, severe pain or breakthrough pain. 30 tablet 0   No current facility-administered medications for this visit.    REVIEW OF SYSTEMS:   Constitutional: ( - )  fevers, ( - )  chills , ( - ) night sweats Eyes: ( - ) blurriness of vision, ( - ) double vision, ( - ) watery eyes Ears, nose, mouth, throat, and face: ( - ) mucositis, ( - ) sore throat Respiratory: ( - ) cough, ( - ) dyspnea, ( - ) wheezes Cardiovascular: ( - ) palpitation, ( - ) chest discomfort, ( - ) lower extremity swelling Gastrointestinal:  ( - ) nausea, ( - ) heartburn, ( - ) change in bowel habits Skin: ( - ) abnormal skin rashes Lymphatics: ( - ) new lymphadenopathy, ( - ) easy bruising Neurological: ( - ) numbness, ( - ) tingling, ( - ) new weaknesses Behavioral/Psych: ( - ) mood change, ( - ) new changes  All other systems were reviewed with the patient and are negative.  PHYSICAL EXAMINATION: ECOG PERFORMANCE STATUS: 1 - Symptomatic but completely ambulatory  Vitals:    07/19/21 0925  BP: (!) 136/93  Pulse: 95  Resp: 18  Temp: 98.3 F (36.8 C)  SpO2: 98%    Filed Weights   07/19/21 0925  Weight: 160 lb 14.4 oz (73 kg)     GENERAL: Well-appearing middle-aged African-American female, alert, no distress and comfortable SKIN: skin color, texture, turgor are normal, no rashes or significant lesions EYES: conjunctiva are pink and non-injected, sclera clear LUNGS: clear to auscultation and percussion with normal breathing effort HEART: regular rate & rhythm and no murmurs and no lower extremity edema ABDOMEN: Ostomy in place producing soft stool.  Soft, non-tender, non-distended, normal bowel sounds Musculoskeletal: no cyanosis of digits and no clubbing  PSYCH: alert & oriented x 3, fluent speech NEURO: no focal motor/sensory deficits  LABORATORY DATA:  I have reviewed the data as listed CBC Latest Ref Rng & Units 07/19/2021 07/08/2021 06/25/2021  WBC 4.0 - 10.5 K/uL 9.9 8.8 8.3  Hemoglobin 12.0 - 15.0 g/dL 13.5 12.7 11.7(L)  Hematocrit 36.0 - 46.0 % 39.3 38.0 34.8(L)  Platelets 150 - 400 K/uL 410(H) 426(H) 265    CMP Latest Ref Rng & Units 07/19/2021 07/08/2021 06/25/2021  Glucose 70 - 99 mg/dL 138(H) 134(H) -  BUN 8 - 23 mg/dL 13 16 -  Creatinine 0.44 - 1.00 mg/dL 0.75 0.71 0.61  Sodium 135 - 145 mmol/L 139 139 -  Potassium 3.5 - 5.1 mmol/L 3.8 3.5 3.0(L)  Chloride 98 - 111 mmol/L 102 103 -  CO2 22 - 32 mmol/L 25 26 -  Calcium 8.9 - 10.3 mg/dL 10.0 9.7 -  Total Protein 6.5 - 8.1 g/dL 8.2(H) 7.7 -  Total Bilirubin 0.3 - 1.2 mg/dL 0.3 0.3 -  Alkaline Phos 38 - 126 U/L 128(H) 130(H) -  AST 15 - 41 U/L 19 19 -  ALT 0 - 44 U/L 13 15 -    RADIOGRAPHIC STUDIES: No results found.  ASSESSMENT & PLAN Mariah Schultz 65 y.o. female with medical history significant for squamous cell cancer of unclear etiology who presents for a follow up visit.   Today we discussed the findings on pathology and the steps moving forward.  At this time this  appears to be a squamous cell carcinoma of unclear etiology.  Based on the radiographic review and the surgical procedure there is no clear etiology for this patient's cancer.  The patient underwent a loop colostomy with biopsy of the lesion.  At this time I recommend that we pursue chemoradiation with cisplatin 40 mg per metered squared and combination with radiation therapy.  We will continue cisplatin for the duration of radiation.  The patient voiced understanding of this plan moving forward.  We will plan to start on 07/20/2021 with concurrent radiation.  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 --RTC in 2 weeks with interval weekly cisplatin treatments  #Supportive Care -- chemotherapy education complete -- port placement later this week.   -- zofran 8mg  q8H PRN and compazine 10mg  PO q6H for nausea -- EMLA cream for port -- pain medication as above   No orders of the defined types were placed in this encounter.   All questions were answered. The patient knows to call the clinic with any problems, questions or concerns.  A total of more than 30 minutes were spent on this encounter with face-to-face time and non-face-to-face time, including preparing to see the patient, ordering tests and/or medications, counseling the patient and coordination of care as outlined above.   Ledell Peoples, MD Department of Hematology/Oncology [place] at [facility] Phone: [phone] Pager: [phone] Email: [email or web address]  07/19/2021 12:07 PM

## 2021-07-20 ENCOUNTER — Inpatient Hospital Stay: Payer: Medicare HMO

## 2021-07-20 ENCOUNTER — Ambulatory Visit
Admission: RE | Admit: 2021-07-20 | Discharge: 2021-07-20 | Disposition: A | Discharge status: Home / Self Care | Payer: Medicare HMO | Source: Ambulatory Visit | Attending: Radiation Oncology | Admitting: Radiation Oncology

## 2021-07-20 ENCOUNTER — Other Ambulatory Visit: Payer: Self-pay | Admitting: Internal Medicine

## 2021-07-20 ENCOUNTER — Ambulatory Visit: Payer: Medicare HMO

## 2021-07-20 ENCOUNTER — Telehealth: Payer: Self-pay | Admitting: Hematology and Oncology

## 2021-07-20 ENCOUNTER — Other Ambulatory Visit: Payer: Self-pay | Admitting: Radiology

## 2021-07-20 VITALS — BP 122/86 | HR 90 | Temp 97.5°F | Resp 18

## 2021-07-20 DIAGNOSIS — C763 Malignant neoplasm of pelvis: Secondary | ICD-10-CM | POA: Diagnosis not present

## 2021-07-20 MED ORDER — DEXAMETHASONE SODIUM PHOSPHATE 100 MG/10ML IJ SOLN
10.0000 mg | Freq: Once | INTRAMUSCULAR | Status: AC
  Administered 2021-07-20: 10 mg via INTRAVENOUS
  Filled 2021-07-20: qty 1

## 2021-07-20 MED ORDER — SODIUM CHLORIDE 0.9 % IV SOLN
150.0000 mg | Freq: Once | INTRAVENOUS | Status: AC
  Administered 2021-07-20: 150 mg via INTRAVENOUS
  Filled 2021-07-20: qty 5

## 2021-07-20 MED ORDER — SODIUM CHLORIDE 0.9 % IV SOLN
40.0000 mg/m2 | Freq: Once | INTRAVENOUS | Status: AC
  Administered 2021-07-20: 73 mg via INTRAVENOUS
  Filled 2021-07-20: qty 73

## 2021-07-20 MED ORDER — MAGNESIUM SULFATE 2 GM/50ML IV SOLN
2.0000 g | Freq: Once | INTRAVENOUS | Status: AC
  Administered 2021-07-20: 2 g via INTRAVENOUS
  Filled 2021-07-20: qty 50

## 2021-07-20 MED ORDER — OXYCODONE HCL 5 MG PO TABS
5.0000 mg | ORAL_TABLET | Freq: Once | ORAL | Status: AC
  Administered 2021-07-20: 5 mg via ORAL
  Filled 2021-07-20: qty 1

## 2021-07-20 MED ORDER — POTASSIUM CHLORIDE IN NACL 20-0.9 MEQ/L-% IV SOLN
Freq: Once | INTRAVENOUS | Status: AC
  Filled 2021-07-20: qty 1000

## 2021-07-20 MED ORDER — PALONOSETRON HCL INJECTION 0.25 MG/5ML
0.2500 mg | Freq: Once | INTRAVENOUS | Status: AC
Start: 2021-07-20 — End: 2021-07-20
  Administered 2021-07-20: 0.25 mg via INTRAVENOUS
  Filled 2021-07-20: qty 5

## 2021-07-20 MED ORDER — SODIUM CHLORIDE 0.9 % IV SOLN: Freq: Once | INTRAVENOUS | Status: AC

## 2021-07-20 NOTE — Patient Instructions (Addendum)
[facility]  Discharge Instructions: Thank you for choosing [facility] to provide your oncology and hematology care.   If you have a lab appointment with the [facility], please go directly to the [facility] and check in at the registration area.   Wear comfortable clothing and clothing appropriate for easy access to any Portacath or PICC line.   We strive to give you quality time with your provider. You may need to reschedule your appointment if you arrive late (15 or more minutes).  Arriving late affects you and other patients whose appointments are after yours.  Also, if you miss three or more appointments without notifying the office, you may be dismissed from the clinic at the provider's discretion.      For prescription refill requests, have your pharmacy contact our office and allow 72 hours for refills to be completed.    Today you received the following chemotherapy and/or immunotherapy agents: cisplatin      To help prevent nausea and vomiting after your treatment, we encourage you to take your nausea medication as directed.  BELOW ARE SYMPTOMS THAT SHOULD BE REPORTED IMMEDIATELY: *FEVER GREATER THAN 100.4 F (38 C) OR HIGHER *CHILLS OR SWEATING *NAUSEA AND VOMITING THAT IS NOT CONTROLLED WITH YOUR NAUSEA MEDICATION *UNUSUAL SHORTNESS OF BREATH *UNUSUAL BRUISING OR BLEEDING *URINARY PROBLEMS (pain or burning when urinating, or frequent urination) *BOWEL PROBLEMS (unusual diarrhea, constipation, pain near the anus) TENDERNESS IN MOUTH AND THROAT WITH OR WITHOUT PRESENCE OF ULCERS (sore throat, sores in mouth, or a toothache) UNUSUAL RASH, SWELLING OR PAIN  UNUSUAL VAGINAL DISCHARGE OR ITCHING   Items with * indicate a potential emergency and should be followed up as soon as possible or go to the Emergency Department if any problems should occur.  Please show the CHEMOTHERAPY ALERT CARD or IMMUNOTHERAPY ALERT CARD at check-in to the  Emergency Department and triage nurse.  Should you have questions after your visit or need to cancel or reschedule your appointment, please contact [facility]  Dept: [phone]  and follow the prompts.  Office hours are 8:00 a.m. to 4:30 p.m. Monday - Friday. Please note that voicemails left after 4:00 p.m. may not be returned until the following business day.  We are closed weekends and major holidays. You have access to a nurse at all times for urgent questions. Please call the main number to the clinic Dept: [phone] and follow the prompts.   For any non-urgent questions, you may also contact your provider using MyChart. We now offer e-Visits for anyone 13 and older to request care online for non-urgent symptoms. For details visit [email or web address].   Also download the MyChart app! Go to the app store, search "MyChart", open the app, select [facility], and log in with your MyChart username and password.  Due to Covid, a mask is required upon entering the hospital/clinic. If you do not have a mask, one will be given to you upon arrival. For doctor visits, patients may have 1 support person aged 22 or older with them. For treatment visits, patients cannot have anyone with them due to current Covid guidelines and our immunocompromised population.   Cisplatin injection What is this medication? CISPLATIN (SIS pla tin) is a chemotherapy drug. It targets fast dividing cells, like cancer cells, and causes these cells to die. This medicine is used to treat many types of cancer like bladder, ovarian, and testicular cancers. This medicine may be used  for other purposes; ask your health care provider or pharmacist if you have questions. COMMON BRAND NAME(S): Platinol, Platinol -AQ What should I tell my care team before I take this medication? They need to know if you have any of these conditions: eye disease, vision problems hearing problems kidney disease low  blood counts, like white cells, platelets, or red blood cells tingling of the fingers or toes, or other nerve disorder an unusual or allergic reaction to cisplatin, carboplatin, oxaliplatin, other medicines, foods, dyes, or preservatives pregnant or trying to get pregnant breast-feeding How should I use this medication? This drug is given as an infusion into a vein. It is administered in a hospital or clinic by a specially trained health care professional. Talk to your pediatrician regarding the use of this medicine in children. Special care may be needed. Overdosage: If you think you have taken too much of this medicine contact a poison control center or emergency room at once. NOTE: This medicine is only for you. Do not share this medicine with others. What if I miss a dose? It is important not to miss a dose. Call your doctor or health care professional if you are unable to keep an appointment. What may interact with this medication? This medicine may interact with the following medications: foscarnet certain antibiotics like amikacin, gentamicin, neomycin, polymyxin B, streptomycin, tobramycin, vancomycin This list may not describe all possible interactions. Give your health care provider a list of all the medicines, herbs, non-prescription drugs, or dietary supplements you use. Also tell them if you smoke, drink alcohol, or use illegal drugs. Some items may interact with your medicine. What should I watch for while using this medication? Your condition will be monitored carefully while you are receiving this medicine. You will need important blood work done while you are taking this medicine. This drug may make you feel generally unwell. This is not uncommon, as chemotherapy can affect healthy cells as well as cancer cells. Report any side effects. Continue your course of treatment even though you feel ill unless your doctor tells you to stop. This medicine may increase your risk of getting  an infection. Call your healthcare professional for advice if you get a fever, chills, or sore throat, or other symptoms of a cold or flu. Do not treat yourself. Try to avoid being around people who are sick. Avoid taking medicines that contain aspirin, acetaminophen, ibuprofen, naproxen, or ketoprofen unless instructed by your healthcare professional. These medicines may hide a fever. This medicine may increase your risk to bruise or bleed. Call your doctor or health care professional if you notice any unusual bleeding. Be careful brushing and flossing your teeth or using a toothpick because you may get an infection or bleed more easily. If you have any dental work done, tell your dentist you are receiving this medicine. Do not become pregnant while taking this medicine or for 14 months after stopping it. Women should inform their healthcare professional if they wish to become pregnant or think they might be pregnant. Men should not father a child while taking this medicine and for 11 months after stopping it. There is potential for serious side effects to an unborn child. Talk to your healthcare professional for more information. Do not breast-feed an infant while taking this medicine. This medicine has caused ovarian failure in some women. This medicine may make it more difficult to get pregnant. Talk to your healthcare professional if you are concerned about your fertility. This medicine has caused  decreased sperm counts in some men. This may make it more difficult to father a child. Talk to your healthcare professional if you are concerned about your fertility. Drink fluids as directed while you are taking this medicine. This will help protect your kidneys. Call your doctor or health care professional if you get diarrhea. Do not treat yourself. What side effects may I notice from receiving this medication? Side effects that you should report to your doctor or health care professional as soon as  possible: allergic reactions like skin rash, itching or hives, swelling of the face, lips, or tongue blurred vision changes in vision decreased hearing or ringing of the ears nausea, vomiting pain, redness, or irritation at site where injected pain, tingling, numbness in the hands or feet signs and symptoms of bleeding such as bloody or black, tarry stools; red or dark brown urine; spitting up blood or brown material that looks like coffee grounds; red spots on the skin; unusual bruising or bleeding from the eyes, gums, or nose signs and symptoms of infection like fever; chills; cough; sore throat; pain or trouble passing urine signs and symptoms of kidney injury like trouble passing urine or change in the amount of urine signs and symptoms of low red blood cells or anemia such as unusually weak or tired; feeling faint or lightheaded; falls; breathing problems Side effects that usually do not require medical attention (report to your doctor or health care professional if they continue or are bothersome): loss of appetite mouth sores muscle cramps This list may not describe all possible side effects. Call your doctor for medical advice about side effects. You may report side effects to FDA at 1-800-FDA-1088. Where should I keep my medication? This drug is given in a hospital or clinic and will not be stored at home. NOTE: This sheet is a summary. It may not cover all possible information. If you have questions about this medicine, talk to your doctor, pharmacist, or health care provider.  2022 Elsevier/Gold Standard (2021-05-11 00:00:00)

## 2021-07-20 NOTE — Progress Notes (Signed)
Patient presents for treatment. RN assessment completed along with the following:  Labs/vitals reviewed - Yes, and within treatment parameters.   Weight within 10% of previous measurement - Yes Oncology Treatment Attestation completed for current therapy- Yes, on date 07/13/21 Informed consent completed and reflects current therapy/intent - Yes, on date 07/20/21             Provider progress note reviewed - Patient not seen by provider today. Most recent note dated 07/19/21 reviewed. Treatment/Antibody/Supportive plan reviewed - Yes and per Dr. Lorenso Courier, ok to run post fluids with cisplatin.  S&H and other orders reviewed - Yes, and there are no additional orders identified. Previous treatment date reviewed - Yes, and the appropriate amount of time has elapsed between treatments.  Patient to proceed with treatment.

## 2021-07-20 NOTE — Telephone Encounter (Signed)
Sch per 11/14 los, called and left message.

## 2021-07-20 NOTE — Progress Notes (Signed)
Pt here for patient teaching.    Pt given Radiation and You booklet and skin care instructions.    Reviewed areas of pertinence such as diarrhea, fatigue, hair loss, nausea and vomiting, sexual and fertility changes, skin changes, and urinary and bladder changes .   Pt able to give teach back of to pat skin, use unscented/gentle soap, use baby wipes, have Imodium on hand, drink plenty of water, and sitz bath,avoid applying anything to skin within 4 hours of treatment.   Pt verbalizes understanding of information given and will contact nursing with any questions or concerns.    Http://rtanswers.org/treatmentinformation/whattoexpect/index             

## 2021-07-21 ENCOUNTER — Ambulatory Visit (HOSPITAL_COMMUNITY)
Admission: RE | Admit: 2021-07-21 | Discharge: 2021-07-21 | Disposition: A | Discharge status: Home / Self Care | Payer: Medicare HMO | Source: Ambulatory Visit | Attending: Hematology and Oncology | Admitting: Hematology and Oncology

## 2021-07-21 ENCOUNTER — Ambulatory Visit: Payer: Medicare HMO

## 2021-07-21 ENCOUNTER — Ambulatory Visit
Admission: RE | Admit: 2021-07-21 | Discharge: 2021-07-21 | Disposition: A | Discharge status: Home / Self Care | Payer: Medicare HMO | Source: Ambulatory Visit | Attending: Radiation Oncology | Admitting: Radiation Oncology

## 2021-07-21 ENCOUNTER — Encounter (HOSPITAL_COMMUNITY): Payer: Self-pay

## 2021-07-21 ENCOUNTER — Other Ambulatory Visit: Payer: Self-pay

## 2021-07-21 DIAGNOSIS — K6289 Other specified diseases of anus and rectum: Secondary | ICD-10-CM

## 2021-07-21 DIAGNOSIS — C763 Malignant neoplasm of pelvis: Secondary | ICD-10-CM | POA: Diagnosis not present

## 2021-07-21 DIAGNOSIS — Z452 Encounter for adjustment and management of vascular access device: Secondary | ICD-10-CM | POA: Diagnosis not present

## 2021-07-21 DIAGNOSIS — Z8616 Personal history of COVID-19: Secondary | ICD-10-CM | POA: Diagnosis not present

## 2021-07-21 DIAGNOSIS — I10 Essential (primary) hypertension: Secondary | ICD-10-CM | POA: Diagnosis not present

## 2021-07-21 DIAGNOSIS — U071 COVID-19: Secondary | ICD-10-CM | POA: Diagnosis not present

## 2021-07-21 DIAGNOSIS — C20 Malignant neoplasm of rectum: Secondary | ICD-10-CM | POA: Diagnosis not present

## 2021-07-21 DIAGNOSIS — C801 Malignant (primary) neoplasm, unspecified: Secondary | ICD-10-CM | POA: Diagnosis not present

## 2021-07-21 DIAGNOSIS — E876 Hypokalemia: Secondary | ICD-10-CM | POA: Diagnosis not present

## 2021-07-21 DIAGNOSIS — Z483 Aftercare following surgery for neoplasm: Secondary | ICD-10-CM | POA: Diagnosis not present

## 2021-07-21 DIAGNOSIS — R918 Other nonspecific abnormal finding of lung field: Secondary | ICD-10-CM | POA: Diagnosis not present

## 2021-07-21 DIAGNOSIS — F17210 Nicotine dependence, cigarettes, uncomplicated: Secondary | ICD-10-CM | POA: Diagnosis not present

## 2021-07-21 DIAGNOSIS — K56600 Partial intestinal obstruction, unspecified as to cause: Secondary | ICD-10-CM | POA: Diagnosis not present

## 2021-07-21 DIAGNOSIS — Z433 Encounter for attention to colostomy: Secondary | ICD-10-CM | POA: Diagnosis not present

## 2021-07-21 HISTORY — PX: IR IMAGING GUIDED PORT INSERTION: IMG5740

## 2021-07-21 MED ORDER — LIDOCAINE-EPINEPHRINE (PF) 2 %-1:200000 IJ SOLN
INTRAMUSCULAR | Status: AC | PRN
  Administered 2021-07-21: 10 mL via INTRADERMAL

## 2021-07-21 MED ORDER — MIDAZOLAM HCL 2 MG/2ML IJ SOLN
INTRAMUSCULAR | Status: AC
  Filled 2021-07-21: qty 4

## 2021-07-21 MED ORDER — FENTANYL CITRATE (PF) 100 MCG/2ML IJ SOLN
INTRAMUSCULAR | Status: AC | PRN
  Administered 2021-07-21: 50 ug via INTRAVENOUS

## 2021-07-21 MED ORDER — HYDROMORPHONE HCL 1 MG/ML IJ SOLN
1.0000 mg | INTRAMUSCULAR | Status: AC
  Administered 2021-07-21: 1 mg via INTRAVENOUS

## 2021-07-21 MED ORDER — SODIUM CHLORIDE 0.9 % IV SOLN: INTRAVENOUS | Status: DC

## 2021-07-21 MED ORDER — MIDAZOLAM HCL 2 MG/2ML IJ SOLN
INTRAMUSCULAR | Status: AC | PRN
  Administered 2021-07-21: 1 mg via INTRAVENOUS

## 2021-07-21 MED ORDER — HYDROMORPHONE HCL 1 MG/ML IJ SOLN
INTRAMUSCULAR | Status: AC
  Filled 2021-07-21: qty 1

## 2021-07-21 MED ORDER — HEPARIN SOD (PORK) LOCK FLUSH 100 UNIT/ML IV SOLN
INTRAVENOUS | Status: AC | PRN
  Administered 2021-07-21: 500 [IU] via INTRAVENOUS

## 2021-07-21 MED ORDER — LIDOCAINE-EPINEPHRINE (PF) 2 %-1:200000 IJ SOLN
INTRAMUSCULAR | Status: AC
  Filled 2021-07-21: qty 20

## 2021-07-21 MED ORDER — FENTANYL CITRATE (PF) 100 MCG/2ML IJ SOLN
INTRAMUSCULAR | Status: AC
  Filled 2021-07-21: qty 2

## 2021-07-21 MED ORDER — FENTANYL CITRATE (PF) 100 MCG/2ML IJ SOLN: 25.0000 ug | Freq: Once | INTRAMUSCULAR | Status: DC

## 2021-07-21 MED ORDER — HEPARIN SOD (PORK) LOCK FLUSH 100 UNIT/ML IV SOLN
INTRAVENOUS | Status: AC
  Filled 2021-07-21: qty 5

## 2021-07-21 MED ORDER — FENTANYL CITRATE PF 50 MCG/ML IJ SOSY
PREFILLED_SYRINGE | INTRAMUSCULAR | Status: AC
  Administered 2021-07-21: 25 ug
  Filled 2021-07-21: qty 1

## 2021-07-21 NOTE — Sedation Documentation (Signed)
Patient is resting comfortably, snoring lightly, in NAD. 

## 2021-07-21 NOTE — Procedures (Signed)
Interventional Radiology Procedure Note  Procedure: RT IJ POWER PORT    Complications: None  Estimated Blood Loss:  MIN  Findings: TIP SVCRA    M. TREVOR Ottis Sarnowski, MD    

## 2021-07-21 NOTE — Progress Notes (Signed)
Pt states she has not had output from colostomy bag in 48 hours, also having bloody / mucous discharge on peri pad.  Discharged home after Port placement and verbalizes understanding to follow up with primary provider regarding previous.

## 2021-07-21 NOTE — Consult Note (Signed)
Chief Complaint: Patient was seen in consultation today for port a cath placement  Referring Physician(s): Dorsey,John T IV  Supervising Physician: Daryll Brod  Patient Status: [facility] - Out-pt  History of Present Illness: Mariah Schultz is a 65 y.o. 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  She presents today for Port-A-Cath placement to assist with treatment.  Past Medical History:  Diagnosis Date   CAP (community acquired pneumonia) 09/10/2013   Hypertension    Papilloma of left breast    Papilloma of left breast 02/26/2019   Perforated appendicitis 03/19/2013    Past Surgical History:  Procedure Laterality Date   ABDOMINAL HYSTERECTOMY     BREAST EXCISIONAL BIOPSY Left 02/26/2019   B9 Biopsy   BREAST LUMPECTOMY WITH RADIOACTIVE SEED LOCALIZATION Left 02/26/2019   Procedure: LEFT BREAST LUMPECTOMY WITH RADIOACTIVE SEED LOCALIZATION;  Surgeon: Fanny Skates, MD;  Location: [place];  Service: General;  Laterality: Left;   LAPAROSCOPIC APPENDECTOMY N/A 02/25/2013   Procedure: APPENDECTOMY LAPAROSCOPIC;  Surgeon: Harl Bowie, MD;  Location: [place];  Service: General;  Laterality: N/A;   LAPAROSCOPY N/A 06/22/2021   Procedure: LAPAROSCOPY DIAGNOSTIC, DIVERTING LOOP COLOSTOMY, ANORECTAL EXAM UNDER ANESTHESIA, TRANSRECTAL CORE BIOPSIES.;  Surgeon: Lafonda Mosses, MD;  Location: [facility];  Service: Gynecology;  Laterality: N/A;    Allergies: Other and Pneumococcal vac polyvalent  Medications: Prior to Admission medications   Medication Sig Start Date End Date Taking? Authorizing Provider  acetaminophen (TYLENOL) 500 MG tablet Take 500 mg by mouth every 6 (six) hours as needed.   Yes [provider]  amLODipine (NORVASC) 5 MG tablet Take by mouth. 12/26/20  Yes [provider]  aspirin 81 MG tablet Take 81 mg by  mouth daily.   Yes [provider]  Multiple Vitamin (MULTIVITAMIN) tablet Take 1 tablet by mouth daily.   Yes [provider]  neomycin (MYCIFRADIN) 500 MG tablet Take 1,000 mg by mouth 3 (three) times daily. 06/18/21  Yes [provider]  Omega-3 Fatty Acids (FISH OIL PO) Take 1 capsule by mouth daily.   Yes [provider]  oxyCODONE (OXY IR/ROXICODONE) 5 MG immediate release tablet Take 1-2 tablets (5-10 mg total) by mouth every 6 (six) hours as needed for moderate pain, severe pain or breakthrough pain. 07/19/21  Yes Orson Slick, MD  valsartan-hydrochlorothiazide (DIOVAN-HCT) 160-12.5 MG tablet Take 1 tablet by mouth daily. 04/07/21  Yes [provider]  vitamin C (ASCORBIC ACID) 500 MG tablet Take 500 mg by mouth daily.   Yes [provider]  albuterol (PROVENTIL HFA;VENTOLIN HFA) 108 (90 BASE) MCG/ACT inhaler Inhale 2 puffs into the lungs every 6 (six) hours as needed for wheezing or shortness of breath.    [provider]  benzonatate (TESSALON) 100 MG capsule TAKE 1 CAPSULE 3 TIMES A DAY AS NEEDED FOR COUGH 02/22/21   [provider]  hydrochlorothiazide (HYDRODIURIL) 25 MG tablet  12/31/20   [provider]  lidocaine-prilocaine (EMLA) cream Apply 1 application topically as needed. 07/08/21   Orson Slick, MD  metroNIDAZOLE (FLAGYL) 500 MG tablet Take by mouth. 06/18/21   [provider]  nirmatrelvir & ritonavir (PAXLOVID) 10 x 150 MG & 10 x 100MG  TBPK Take 3 tablets by mouth 2 (two) times daily. 02/22/21   [provider]  ondansetron (ZOFRAN) 8 MG tablet Take 1 tablet (8 mg total)  by mouth every 8 (eight) hours as needed. 07/08/21   Orson Slick, MD  prochlorperazine (COMPAZINE) 10 MG tablet Take 1 tablet (10 mg total) by mouth every 6 (six) hours as needed for nausea or vomiting. 07/08/21   Orson Slick, MD  traMADol Veatrice Bourbon) 50 MG tablet  06/11/21   [provider]      Family History  Problem Relation Age of Onset   Heart disease Father    Colon cancer Neg Hx    Breast cancer Neg Hx    Ovarian cancer Neg Hx    Pancreatic cancer Neg Hx    Prostate cancer Neg Hx    Endometrial cancer Neg Hx        Patient unaware    Social History   Socioeconomic History   Marital status: Married    Spouse name: Not on file   Number of children: Not on file   Years of education: Not on file   Highest education level: Not on file  Occupational History   Not on file  Tobacco Use   Smoking status: Every Day    Packs/day: 1.00    Years: 40.00    Pack years: 40.00    Types: Cigarettes   Smokeless tobacco: Never  Vaping Use   Vaping Use: Never used  Substance and Sexual Activity   Alcohol use: Yes    Comment: occ   Drug use: No   Sexual activity: Not Currently    Birth control/protection: Surgical  Other Topics Concern   Not on file  Social History Narrative   Not on file   Social Determinants of Health   Financial Resource Strain: Low Risk    Difficulty of Paying Living Expenses: Not very hard  Food Insecurity: No Food Insecurity   Worried About Running Out of Food in the Last Year: Never true   Ran Out of Food in the Last Year: Never true  Transportation Needs: No Transportation Needs   Lack of Transportation (Medical): No   Lack of Transportation (Non-Medical): No  Physical Activity: Not on file  Stress: Not on file  Social Connections: Not on file      Review of Systems currently denies fever, headache, chest pain, dyspnea, cough, abdominal pain, nausea, vomiting.  She does have low back and rectal discomfort  Vital Signs: BP 134/89   Pulse 71   Temp 98.5 F (36.9 C) (Oral)   Resp 18   Ht 5\' 4"  (1.626 m)   Wt 161 lb (73 kg)   SpO2 93%   BMI 27.64 kg/m   Physical Exam awake, alert.  Chest clear to auscultation bilaterally.  Heart with regular rate and rhythm.  Abdomen soft, ostomy intact, few bowel sounds, currently nontender.   No lower extremity edema.  Imaging: No results found.  Labs:  CBC: Recent Labs    06/23/21 0421 06/25/21 0544 07/08/21 1405 07/19/21 0833  WBC 17.2* 8.3 8.8 9.9  HGB 13.0 11.7* 12.7 13.5  HCT 39.0 34.8* 38.0 39.3  PLT 309 265 426* 410*    COAGS: No results for input(s): INR, APTT in the last 8760 hours.  BMP: Recent Labs    06/21/21 0848 06/23/21 0421 06/25/21 0544 07/08/21 1405 07/19/21 0833  NA 138 137  --  139 139  K 4.0 3.8 3.0* 3.5 3.8  CL 103 103  --  103 102  CO2 28 22  --  26 25  GLUCOSE 113* 127*  --  134* 138*  BUN 15 8  --  16 13  CALCIUM 9.7 8.8*  --  9.7 10.0  CREATININE 0.73 0.73 0.61 0.71 0.75  GFRNONAA >60 >60 >60 >60 >60    LIVER FUNCTION TESTS: Recent Labs    06/11/21 1049 07/08/21 1405 07/19/21 0833  BILITOT 0.4 0.3 0.3  AST 17 19 19   ALT 16 15 13   ALKPHOS 149* 130* 128*  PROT 7.8 7.7 8.2*  ALBUMIN 3.5 3.4* 3.6    TUMOR MARKERS: No results for input(s): AFPTM, CEA, CA199, CHROMGRNA in the last 8760 hours.  Assessment and Plan: 66 y.o. 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  She presents today for Port-A-Cath placement to assist with treatment.Risks and benefits of image guided port-a-catheter placement was discussed with the patient including, but not limited to bleeding, infection, pneumothorax, or fibrin sheath development and need for additional procedures.  All of the patient's questions were answered, patient is agreeable to proceed. Consent signed and in chart.    Thank you for this interesting consult.  I greatly enjoyed meeting Mariah Schultz and look forward to participating in their care.  A copy of this report was sent to the requesting provider on this date.  Electronically Signed: D. Rowe Robert, PA-C 07/21/2021, 12:46 PM   I spent a total of  25 minutes   in face to face in clinical  consultation, greater than 50% of which was counseling/coordinating care for Port-A-Cath placement

## 2021-07-21 NOTE — Sedation Documentation (Signed)
Patient is resting comfortably with eyes closed, in NAD. 

## 2021-07-22 ENCOUNTER — Ambulatory Visit
Admission: RE | Admit: 2021-07-22 | Discharge: 2021-07-22 | Disposition: A | Discharge status: Home / Self Care | Payer: Medicare HMO | Source: Ambulatory Visit | Attending: Radiation Oncology | Admitting: Radiation Oncology

## 2021-07-22 ENCOUNTER — Ambulatory Visit: Payer: Medicare HMO

## 2021-07-22 DIAGNOSIS — C763 Malignant neoplasm of pelvis: Secondary | ICD-10-CM | POA: Diagnosis not present

## 2021-07-23 ENCOUNTER — Other Ambulatory Visit: Payer: Self-pay

## 2021-07-23 ENCOUNTER — Ambulatory Visit: Payer: Medicare HMO

## 2021-07-23 ENCOUNTER — Ambulatory Visit
Admission: RE | Admit: 2021-07-23 | Discharge: 2021-07-23 | Disposition: A | Discharge status: Home / Self Care | Payer: Medicare HMO | Source: Ambulatory Visit | Attending: Radiation Oncology | Admitting: Radiation Oncology

## 2021-07-23 DIAGNOSIS — C763 Malignant neoplasm of pelvis: Secondary | ICD-10-CM | POA: Diagnosis not present

## 2021-07-25 ENCOUNTER — Ambulatory Visit
Admission: RE | Admit: 2021-07-25 | Discharge: 2021-07-25 | Disposition: A | Discharge status: Home / Self Care | Payer: Medicare HMO | Source: Ambulatory Visit | Attending: Radiation Oncology | Admitting: Radiation Oncology

## 2021-07-25 DIAGNOSIS — C763 Malignant neoplasm of pelvis: Secondary | ICD-10-CM | POA: Diagnosis not present

## 2021-07-26 ENCOUNTER — Other Ambulatory Visit: Payer: Self-pay | Admitting: Hematology and Oncology

## 2021-07-26 ENCOUNTER — Ambulatory Visit: Payer: Medicare HMO

## 2021-07-26 ENCOUNTER — Inpatient Hospital Stay: Payer: Medicare HMO

## 2021-07-26 ENCOUNTER — Other Ambulatory Visit: Payer: Self-pay

## 2021-07-26 VITALS — BP 116/67 | HR 71 | Temp 98.3°F | Resp 18 | Wt 165.5 lb

## 2021-07-26 DIAGNOSIS — C763 Malignant neoplasm of pelvis: Secondary | ICD-10-CM | POA: Diagnosis not present

## 2021-07-26 LAB — CBC WITH DIFFERENTIAL/PLATELET
Abs Immature Granulocytes: 0.1 10*3/uL — ABNORMAL HIGH (ref 0.00–0.07)
Basophils Absolute: 0 10*3/uL (ref 0.0–0.1)
Basophils Relative: 0 %
Eosinophils Absolute: 0.5 10*3/uL (ref 0.0–0.5)
Eosinophils Relative: 5 %
HCT: 31.9 % — ABNORMAL LOW (ref 36.0–46.0)
Hemoglobin: 11 g/dL — ABNORMAL LOW (ref 12.0–15.0)
Immature Granulocytes: 1 %
Lymphocytes Relative: 4 %
Lymphs Abs: 0.5 10*3/uL — ABNORMAL LOW (ref 0.7–4.0)
MCH: 30.6 pg (ref 26.0–34.0)
MCHC: 34.5 g/dL (ref 30.0–36.0)
MCV: 88.6 fL (ref 80.0–100.0)
Monocytes Absolute: 0.6 10*3/uL (ref 0.1–1.0)
Monocytes Relative: 6 %
Neutro Abs: 9.1 10*3/uL — ABNORMAL HIGH (ref 1.7–7.7)
Neutrophils Relative %: 84 %
Platelets: 328 10*3/uL (ref 150–400)
RBC: 3.6 MIL/uL — ABNORMAL LOW (ref 3.87–5.11)
RDW: 13.3 % (ref 11.5–15.5)
WBC: 10.8 10*3/uL — ABNORMAL HIGH (ref 4.0–10.5)
nRBC: 0 % (ref 0.0–0.2)

## 2021-07-26 LAB — CMP (CANCER CENTER ONLY)
ALT: 117 U/L — ABNORMAL HIGH (ref 0–44)
AST: 69 U/L — ABNORMAL HIGH (ref 15–41)
Albumin: 3.3 g/dL — ABNORMAL LOW (ref 3.5–5.0)
Alkaline Phosphatase: 175 U/L — ABNORMAL HIGH (ref 38–126)
Anion gap: 9 (ref 5–15)
BUN: 19 mg/dL (ref 8–23)
CO2: 28 mmol/L (ref 22–32)
Calcium: 9.1 mg/dL (ref 8.9–10.3)
Chloride: 97 mmol/L — ABNORMAL LOW (ref 98–111)
Creatinine: 0.54 mg/dL (ref 0.44–1.00)
GFR, Estimated: >60 mL/min (ref >60)
Glucose, Bld: 133 mg/dL — ABNORMAL HIGH (ref 70–99)
Potassium: 3.2 mmol/L — ABNORMAL LOW (ref 3.5–5.1)
Sodium: 134 mmol/L — ABNORMAL LOW (ref 135–145)
Total Bilirubin: 0.5 mg/dL (ref 0.3–1.2)
Total Protein: 7.2 g/dL (ref 6.5–8.1)

## 2021-07-26 MED ORDER — MAGNESIUM SULFATE 2 GM/50ML IV SOLN
2.0000 g | Freq: Once | INTRAVENOUS | Status: AC
  Administered 2021-07-26: 2 g via INTRAVENOUS
  Filled 2021-07-26: qty 50

## 2021-07-26 MED ORDER — HEPARIN SOD (PORK) LOCK FLUSH 100 UNIT/ML IV SOLN
500.0000 [IU] | Freq: Once | INTRAVENOUS | Status: AC | PRN
  Administered 2021-07-26: 500 [IU]

## 2021-07-26 MED ORDER — SODIUM CHLORIDE 0.9 % IV SOLN
150.0000 mg | Freq: Once | INTRAVENOUS | Status: AC
  Administered 2021-07-26: 150 mg via INTRAVENOUS
  Filled 2021-07-26: qty 150

## 2021-07-26 MED ORDER — POTASSIUM CHLORIDE IN NACL 20-0.9 MEQ/L-% IV SOLN
Freq: Once | INTRAVENOUS | Status: AC
  Filled 2021-07-26: qty 1000

## 2021-07-26 MED ORDER — SODIUM CHLORIDE 0.9 % IV SOLN
40.0000 mg/m2 | Freq: Once | INTRAVENOUS | Status: AC
  Administered 2021-07-26: 73 mg via INTRAVENOUS
  Filled 2021-07-26: qty 73

## 2021-07-26 MED ORDER — SODIUM CHLORIDE 0.9 % IV SOLN
10.0000 mg | Freq: Once | INTRAVENOUS | Status: AC
  Administered 2021-07-26: 10 mg via INTRAVENOUS
  Filled 2021-07-26: qty 10

## 2021-07-26 MED ORDER — SODIUM CHLORIDE 0.9 % IV SOLN: Freq: Once | INTRAVENOUS | Status: AC

## 2021-07-26 MED ORDER — OXYCODONE HCL 5 MG PO TABS: 5.0000 mg | ORAL_TABLET | Freq: Four times a day (QID) | ORAL | 0 refills | Status: DC | PRN

## 2021-07-26 MED ORDER — PALONOSETRON HCL INJECTION 0.25 MG/5ML
0.2500 mg | Freq: Once | INTRAVENOUS | Status: AC
  Administered 2021-07-26: 0.25 mg via INTRAVENOUS
  Filled 2021-07-26: qty 5

## 2021-07-26 MED ORDER — SODIUM CHLORIDE 0.9% FLUSH
10.0000 mL | INTRAVENOUS | Status: DC | PRN
  Administered 2021-07-26: 10 mL

## 2021-07-26 NOTE — Progress Notes (Signed)
OK to trt w/ elevated ALT of 117 per MD

## 2021-07-26 NOTE — Patient Instructions (Signed)
[facility]COL[facility]  Discharge Instructions: Thank you for choosing [facility] to provide your oncology and hematology care.   If you have a lab appointment with the Cancer Center, please go directly to the Cancer Center and check in at the registration area.   Wear comfortable clothing and clothing appropriate for easy access to any Portacath or PICC line.   We strive to give you quality time with your provider. You may need to reschedule your appointment if you arrive late (15 or more minutes).  Arriving late affects you and other patients whose appointments are after yours.  Also, if you miss three or more appointments without notifying the office, you may be dismissed from the clinic at the provider's discretion.      For prescription refill requests, have your pharmacy contact our office and allow 72 hours for refills to be completed.    Today you received the following chemotherapy and/or immunotherapy agents : Cisplatin    To help prevent nausea and vomiting after your treatment, we encourage you to take your nausea medication as directed.  BELOW ARE SYMPTOMS THAT SHOULD BE REPORTED IMMEDIATELY: *FEVER GREATER THAN 100.4 F (38 C) OR HIGHER *CHILLS OR SWEATING *NAUSEA AND VOMITING THAT IS NOT CONTROLLED WITH YOUR NAUSEA MEDICATION *UNUSUAL SHORTNESS OF BREATH *UNUSUAL BRUISING OR BLEEDING *URINARY PROBLEMS (pain or burning when urinating, or frequent urination) *BOWEL PROBLEMS (unusual diarrhea, constipation, pain near the anus) TENDERNESS IN MOUTH AND THROAT WITH OR WITHOUT PRESENCE OF ULCERS (sore throat, sores in mouth, or a toothache) UNUSUAL RASH, SWELLING OR PAIN  UNUSUAL VAGINAL DISCHARGE OR ITCHING   Items with * indicate a potential emergency and should be followed up as soon as possible or go to the Emergency Department if any problems should occur.  Please show the CHEMOTHERAPY ALERT CARD or IMMUNOTHERAPY ALERT CARD at check-in to  the Emergency Department and triage nurse.  Should you have questions after your visit or need to cancel or reschedule your appointment, please contact [facility]COL[facility]  Dept: [phone]  and follow the prompts.  Office hours are 8:00 a.m. to 4:30 p.m. Monday - Friday. Please note that voicemails left after 4:00 p.m. may not be returned until the following business day.  We are closed weekends and major holidays. You have access to a nurse at all times for urgent questions. Please call the main number to the clinic Dept: [phone] and follow the prompts.   For any non-urgent questions, you may also contact your provider using MyChart. We now offer e-Visits for anyone 18 and older to request care online for non-urgent symptoms. For details visit [email or web address].   Also download the MyChart app! Go to the app store, search "MyChart", open the app, select [facility], and log in with your MyChart username and password.  Due to Covid, a mask is required upon entering the hospital/clinic. If you do not have a mask, one will be given to you upon arrival. For doctor visits, patients may have 1 support person aged 18 or older with them. For treatment visits, patients cannot have anyone with them due to current Covid guidelines and our immunocompromised population.   

## 2021-07-26 NOTE — Progress Notes (Signed)
Per MD OK to infuse post hydration with Cisplatin

## 2021-07-26 NOTE — Progress Notes (Signed)
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  S/p diverting loop colostomy on 10/18.  Patient started radiation on 11/14 and cisplatin on 11/15.   Met with patient during infusion.  Patient reports that appetite is lousy and has been for the last few weeks.  Having abdominal pain during visit.  Says that she has taking medication.  Reports that abdominal pain effects intake.  Also has issues with nausea, randomly.  Has not been taking medication.  Reports was able to eat french toast recently.  Drinking ensure complete 2 a day and eating [employer] bars.  Says that she eats these during the night sometimes.  Also able to eat peanut butter crackers.     Medications: zofran, compazine, MVI, omega 3, Vit C  Labs: glucose 133, Na 134, K 3.2  Anthropometrics:   Height: 64 inches Weight: 165 lb 8 oz on 11/21 170 lb on 9/30 BMI: 27  3% weight loss in the last 2 months   Estimated Energy Needs  Kcals: 4332-9518 Protein: 94-112 g Fluid: 1.8 L  NUTRITION DIAGNOSIS: Inadequate oral intake related to cancer, abdominal pain as evidenced by 3% weight loss and poor po intake   INTERVENTION:  Encouraged increasing ensure complete if able to 3 shakes per day. Coupons given. Encouraged taking nausea medications. Contact information given to patient    MONITORING, EVALUATION, GOAL: weight trends, intake   NEXT VISIT: Monday, Dec 5 during infusion  Damani Rando B. Zenia Resides, [place], [place] Registered Dietitian [phone] (mobile)

## 2021-07-27 ENCOUNTER — Ambulatory Visit: Payer: Medicare HMO

## 2021-07-27 ENCOUNTER — Ambulatory Visit
Admission: RE | Admit: 2021-07-27 | Discharge: 2021-07-27 | Disposition: A | Discharge status: Home / Self Care | Payer: Medicare HMO | Source: Ambulatory Visit | Attending: Radiation Oncology | Admitting: Radiation Oncology

## 2021-07-27 DIAGNOSIS — C763 Malignant neoplasm of pelvis: Secondary | ICD-10-CM | POA: Diagnosis not present

## 2021-07-28 ENCOUNTER — Ambulatory Visit: Payer: Medicare HMO

## 2021-07-28 ENCOUNTER — Other Ambulatory Visit: Payer: Self-pay

## 2021-07-28 ENCOUNTER — Ambulatory Visit
Admission: RE | Admit: 2021-07-28 | Discharge: 2021-07-28 | Disposition: A | Discharge status: Home / Self Care | Payer: Medicare HMO | Source: Ambulatory Visit | Attending: Radiation Oncology | Admitting: Radiation Oncology

## 2021-07-28 DIAGNOSIS — R918 Other nonspecific abnormal finding of lung field: Secondary | ICD-10-CM | POA: Diagnosis not present

## 2021-07-28 DIAGNOSIS — K56600 Partial intestinal obstruction, unspecified as to cause: Secondary | ICD-10-CM | POA: Diagnosis not present

## 2021-07-28 DIAGNOSIS — U071 COVID-19: Secondary | ICD-10-CM | POA: Diagnosis not present

## 2021-07-28 DIAGNOSIS — C20 Malignant neoplasm of rectum: Secondary | ICD-10-CM | POA: Diagnosis not present

## 2021-07-28 DIAGNOSIS — E876 Hypokalemia: Secondary | ICD-10-CM | POA: Diagnosis not present

## 2021-07-28 DIAGNOSIS — F17210 Nicotine dependence, cigarettes, uncomplicated: Secondary | ICD-10-CM | POA: Diagnosis not present

## 2021-07-28 DIAGNOSIS — Z433 Encounter for attention to colostomy: Secondary | ICD-10-CM | POA: Diagnosis not present

## 2021-07-28 DIAGNOSIS — Z483 Aftercare following surgery for neoplasm: Secondary | ICD-10-CM | POA: Diagnosis not present

## 2021-07-28 DIAGNOSIS — C763 Malignant neoplasm of pelvis: Secondary | ICD-10-CM | POA: Diagnosis not present

## 2021-07-28 DIAGNOSIS — I10 Essential (primary) hypertension: Secondary | ICD-10-CM | POA: Diagnosis not present

## 2021-07-30 ENCOUNTER — Other Ambulatory Visit: Payer: Self-pay | Admitting: Hematology and Oncology

## 2021-07-30 ENCOUNTER — Telehealth: Payer: Self-pay | Admitting: *Deleted

## 2021-07-30 MED ORDER — OXYCODONE HCL 5 MG PO TABS: 5.0000 mg | ORAL_TABLET | Freq: Four times a day (QID) | ORAL | 0 refills | Status: DC | PRN

## 2021-07-30 MED FILL — Fosaprepitant Dimeglumine For IV Infusion 150 MG (Base Eq): INTRAVENOUS | Qty: 5 | Status: AC

## 2021-07-30 MED FILL — Dexamethasone Sodium Phosphate Inj 100 MG/10ML: INTRAMUSCULAR | Qty: 1 | Status: AC

## 2021-07-30 NOTE — Telephone Encounter (Signed)
Received call from patient. She is requesting refill of her Oxycodone 5 mg tabs. Last refill was 07/26/21 for # 60. Pt states her pain is increasing and at times takes 3 tabs at a time.  She also states she is passing blood clots per rectum. Denies dizzyness, change in fatigue level, SOB  She is scheduled to see Dr. Lorenso Courier on 08/02/21

## 2021-08-02 ENCOUNTER — Inpatient Hospital Stay: Payer: Medicare HMO

## 2021-08-02 ENCOUNTER — Ambulatory Visit
Admission: RE | Admit: 2021-08-02 | Discharge: 2021-08-02 | Disposition: A | Discharge status: Home / Self Care | Payer: Medicare HMO | Source: Ambulatory Visit | Attending: Radiation Oncology | Admitting: Radiation Oncology

## 2021-08-02 ENCOUNTER — Other Ambulatory Visit: Payer: Self-pay | Admitting: Hematology and Oncology

## 2021-08-02 ENCOUNTER — Other Ambulatory Visit: Payer: Self-pay

## 2021-08-02 ENCOUNTER — Inpatient Hospital Stay: Payer: Medicare HMO | Admitting: Hematology and Oncology

## 2021-08-02 ENCOUNTER — Ambulatory Visit: Payer: Medicare HMO

## 2021-08-02 VITALS — BP 118/65 | HR 80 | Temp 97.5°F | Resp 18 | Wt 162.8 lb

## 2021-08-02 DIAGNOSIS — K6289 Other specified diseases of anus and rectum: Secondary | ICD-10-CM

## 2021-08-02 DIAGNOSIS — C763 Malignant neoplasm of pelvis: Secondary | ICD-10-CM

## 2021-08-02 LAB — CMP (CANCER CENTER ONLY)
ALT: 100 U/L — ABNORMAL HIGH (ref 0–44)
AST: 29 U/L (ref 15–41)
Albumin: 2.6 g/dL — ABNORMAL LOW (ref 3.5–5.0)
Alkaline Phosphatase: 289 U/L — ABNORMAL HIGH (ref 38–126)
Anion gap: 14 (ref 5–15)
BUN: 20 mg/dL (ref 8–23)
CO2: 26 mmol/L (ref 22–32)
Calcium: 9.4 mg/dL (ref 8.9–10.3)
Chloride: 94 mmol/L — ABNORMAL LOW (ref 98–111)
Creatinine: 0.86 mg/dL (ref 0.44–1.00)
GFR, Estimated: >60 mL/min (ref >60)
Glucose, Bld: 142 mg/dL — ABNORMAL HIGH (ref 70–99)
Potassium: 3.2 mmol/L — ABNORMAL LOW (ref 3.5–5.1)
Sodium: 134 mmol/L — ABNORMAL LOW (ref 135–145)
Total Bilirubin: 0.4 mg/dL (ref 0.3–1.2)
Total Protein: 7.5 g/dL (ref 6.5–8.1)

## 2021-08-02 LAB — CBC WITH DIFFERENTIAL (CANCER CENTER ONLY)
Abs Immature Granulocytes: 0.09 10*3/uL — ABNORMAL HIGH (ref 0.00–0.07)
Basophils Absolute: 0 10*3/uL (ref 0.0–0.1)
Basophils Relative: 0 %
Eosinophils Absolute: 0 10*3/uL (ref 0.0–0.5)
Eosinophils Relative: 0 %
HCT: 30.2 % — ABNORMAL LOW (ref 36.0–46.0)
Hemoglobin: 10.4 g/dL — ABNORMAL LOW (ref 12.0–15.0)
Immature Granulocytes: 1 %
Lymphocytes Relative: 1 %
Lymphs Abs: 0.2 10*3/uL — ABNORMAL LOW (ref 0.7–4.0)
MCH: 30.2 pg (ref 26.0–34.0)
MCHC: 34.4 g/dL (ref 30.0–36.0)
MCV: 87.8 fL (ref 80.0–100.0)
Monocytes Absolute: 0.8 10*3/uL (ref 0.1–1.0)
Monocytes Relative: 5 %
Neutro Abs: 14.1 10*3/uL — ABNORMAL HIGH (ref 1.7–7.7)
Neutrophils Relative %: 93 %
Platelet Count: 360 10*3/uL (ref 150–400)
RBC: 3.44 MIL/uL — ABNORMAL LOW (ref 3.87–5.11)
RDW: 13.2 % (ref 11.5–15.5)
WBC Count: 15.3 10*3/uL — ABNORMAL HIGH (ref 4.0–10.5)
nRBC: 0 % (ref 0.0–0.2)

## 2021-08-02 MED ORDER — XTAMPZA ER 13.5 MG PO C12A: 13.5000 mg | EXTENDED_RELEASE_CAPSULE | Freq: Two times a day (BID) | ORAL | 0 refills | Status: AC

## 2021-08-02 MED ORDER — HYDROMORPHONE HCL 1 MG/ML IJ SOLN
2.0000 mg | Freq: Once | INTRAMUSCULAR | Status: AC
  Administered 2021-08-02: 14:00:00 2 mg via INTRAVENOUS
  Filled 2021-08-02: qty 2

## 2021-08-02 MED ORDER — HYDROMORPHONE HCL 1 MG/ML IJ SOLN
2.0000 mg | Freq: Once | INTRAMUSCULAR | Status: AC
  Administered 2021-08-02: 10:00:00 2 mg via INTRAVENOUS

## 2021-08-02 MED ORDER — HYDROMORPHONE HCL 2 MG PO TABS: 2.0000 mg | ORAL_TABLET | Freq: Four times a day (QID) | ORAL | 0 refills | Status: DC | PRN

## 2021-08-02 MED ORDER — MAGNESIUM SULFATE 2 GM/50ML IV SOLN
2.0000 g | Freq: Once | INTRAVENOUS | Status: AC
  Administered 2021-08-02: 10:00:00 2 g via INTRAVENOUS
  Filled 2021-08-02: qty 50

## 2021-08-02 MED ORDER — HYDROMORPHONE HCL 1 MG/ML IJ SOLN
2.0000 mg | Freq: Once | INTRAMUSCULAR | Status: AC
  Filled 2021-08-02: qty 2

## 2021-08-02 MED ORDER — SODIUM CHLORIDE 0.9 % IV SOLN
150.0000 mg | Freq: Once | INTRAVENOUS | Status: AC
  Administered 2021-08-02: 12:00:00 150 mg via INTRAVENOUS
  Filled 2021-08-02: qty 150

## 2021-08-02 MED ORDER — PALONOSETRON HCL INJECTION 0.25 MG/5ML
0.2500 mg | Freq: Once | INTRAVENOUS | Status: AC
  Administered 2021-08-02: 12:00:00 0.25 mg via INTRAVENOUS
  Filled 2021-08-02: qty 5

## 2021-08-02 MED ORDER — SODIUM CHLORIDE 0.9 % IV SOLN
10.0000 mg | Freq: Once | INTRAVENOUS | Status: AC
  Administered 2021-08-02: 12:00:00 10 mg via INTRAVENOUS
  Filled 2021-08-02: qty 10

## 2021-08-02 MED ORDER — HEPARIN SOD (PORK) LOCK FLUSH 100 UNIT/ML IV SOLN
500.0000 [IU] | Freq: Once | INTRAVENOUS | Status: AC | PRN
  Administered 2021-08-02: 16:00:00 500 [IU]

## 2021-08-02 MED ORDER — SODIUM CHLORIDE 0.9% FLUSH
10.0000 mL | INTRAVENOUS | Status: DC | PRN
  Administered 2021-08-02: 16:00:00 10 mL

## 2021-08-02 MED ORDER — POTASSIUM CHLORIDE IN NACL 20-0.9 MEQ/L-% IV SOLN
Freq: Once | INTRAVENOUS | Status: AC
  Filled 2021-08-02: qty 1000

## 2021-08-02 MED ORDER — SODIUM CHLORIDE 0.9 % IV SOLN: Freq: Once | INTRAVENOUS | Status: AC

## 2021-08-02 MED ORDER — SODIUM CHLORIDE 0.9 % IV SOLN
40.0000 mg/m2 | Freq: Once | INTRAVENOUS | Status: AC
  Administered 2021-08-02: 13:00:00 73 mg via INTRAVENOUS
  Filled 2021-08-02: qty 73

## 2021-08-02 NOTE — Patient Instructions (Signed)
[facility]COL[facility]  Discharge Instructions: Thank you for choosing [facility] to provide your oncology and hematology care.   If you have a lab appointment with the Cancer Center, please go directly to the Cancer Center and check in at the registration area.   Wear comfortable clothing and clothing appropriate for easy access to any Portacath or PICC line.   We strive to give you quality time with your provider. You may need to reschedule your appointment if you arrive late (15 or more minutes).  Arriving late affects you and other patients whose appointments are after yours.  Also, if you miss three or more appointments without notifying the office, you may be dismissed from the clinic at the provider's discretion.      For prescription refill requests, have your pharmacy contact our office and allow 72 hours for refills to be completed.    Today you received the following chemotherapy and/or immunotherapy agents : Cisplatin    To help prevent nausea and vomiting after your treatment, we encourage you to take your nausea medication as directed.  BELOW ARE SYMPTOMS THAT SHOULD BE REPORTED IMMEDIATELY: *FEVER GREATER THAN 100.4 F (38 C) OR HIGHER *CHILLS OR SWEATING *NAUSEA AND VOMITING THAT IS NOT CONTROLLED WITH YOUR NAUSEA MEDICATION *UNUSUAL SHORTNESS OF BREATH *UNUSUAL BRUISING OR BLEEDING *URINARY PROBLEMS (pain or burning when urinating, or frequent urination) *BOWEL PROBLEMS (unusual diarrhea, constipation, pain near the anus) TENDERNESS IN MOUTH AND THROAT WITH OR WITHOUT PRESENCE OF ULCERS (sore throat, sores in mouth, or a toothache) UNUSUAL RASH, SWELLING OR PAIN  UNUSUAL VAGINAL DISCHARGE OR ITCHING   Items with * indicate a potential emergency and should be followed up as soon as possible or go to the Emergency Department if any problems should occur.  Please show the CHEMOTHERAPY ALERT CARD or IMMUNOTHERAPY ALERT CARD at check-in to  the Emergency Department and triage nurse.  Should you have questions after your visit or need to cancel or reschedule your appointment, please contact [facility]  Dept: [phone]  and follow the prompts.  Office hours are 8:00 a.m. to 4:30 p.m. Monday - Friday. Please note that voicemails left after 4:00 p.m. may not be returned until the following business day.  We are closed weekends and major holidays. You have access to a nurse at all times for urgent questions. Please call the main number to the clinic Dept: [phone] and follow the prompts.   For any non-urgent questions, you may also contact your provider using MyChart. We now offer e-Visits for anyone 18 and older to request care online for non-urgent symptoms. For details visit [email or web address].   Also download the MyChart app! Go to the app store, search "MyChart", open the app, select [facility], and log in with your MyChart username and password.  Due to Covid, a mask is required upon entering the hospital/clinic. If you do not have a mask, one will be given to you upon arrival. For doctor visits, patients may have 1 support person aged 18 or older with them. For treatment visits, patients cannot have anyone with them due to current Covid guidelines and our immunocompromised population.   

## 2021-08-02 NOTE — Progress Notes (Signed)
Per Dr. Lorenso Courier, ok to treat with elevated ALT.  Patient reported pain was relieved by dilaudid for about 2.5 hours and then began to return. MD notified, additional 2mg  dilaudid given.

## 2021-08-02 NOTE — Progress Notes (Signed)
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 Findings showed a 7.5 x 6.9 cm heterogeneously enhancing mass is noted posteriorly in the pelvis anterior to the sacrum which is highly concerning for colorectal malignancy with possible adjacent metastatic adenopathy. 05/24/2021: colonoscopy performed by [facility]. Biopsy showed poorly differentiated high grade carcinoma, differential including tuboovarian and peritoneal. Immunophemotype not consistent with colorectal adenocarcinoma.  06/04/2021: PET CT scan performed, findings show hypermetabolic mass adjacent to the rectum, likely due to colorectal primary neoplasm and ill-defined hypermetabolic nodule is seen more inferiorly in the left perirectal fat, likely a lymph node metastasis. She was also noted to have Numerous small bilateral small solid pulmonary nodules which are below resolution for PET-CT 06/11/2021: establish care with Dr. Lorenso Courier  06/22/2021: Laparoscopic diagnostic procedure with diverting loop colostomy and anorectal exam under anesthesia.  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 07/19/2021: Start of Radiation therapy.  07/20/2021: Week 1 of Cisplatin 40mg /m2 07/27/2021: Week 2 of Cisplatin 40mg /m2 08/02/2021: Week 3 of  Cisplatin 40mg /m2  Interval History:  Mariah Schultz 65 y.o. female with medical history significant for squamous cell cancer of unclear etiology who presents for a follow up visit. The patient's last visit was on 07/19/2021. In the interim since the last visit she continued chemoradiation.  On exam today [name] reports that she has been having continuing pain.  She is taking oxycodone but it is "not working".  She notes that she is taking more than her prescribed dose and it is not helping with the pain.  She describes the pain is typically 10 out of 10 in severity.  She also notes that she feels swollen in the abdomen but is been taking MiraLAX and that is helped the bowels moved and help relieve the symptoms.  Her appetite has been poor and she is having rectal pain.  Fortunately she denies any fevers, chills, sweats, nausea, vomiting or diarrhea.  A full 10 point ROS is listed below.   MEDICAL HISTORY:  Past Medical History:  Diagnosis Date   CAP (community acquired pneumonia) 09/10/2013   Hypertension    Papilloma of left breast    Papilloma of left breast 02/26/2019   Perforated appendicitis 03/19/2013    SURGICAL HISTORY: Past Surgical History:  Procedure Laterality Date   ABDOMINAL HYSTERECTOMY     BREAST EXCISIONAL BIOPSY Left 02/26/2019   B9 Biopsy   BREAST LUMPECTOMY WITH RADIOACTIVE SEED LOCALIZATION Left 02/26/2019   Procedure: LEFT BREAST LUMPECTOMY WITH RADIOACTIVE SEED LOCALIZATION;  Surgeon: Fanny Skates, MD;  Location: [place];  Service: General;  Laterality: Left;   IR IMAGING GUIDED PORT INSERTION  07/21/2021   LAPAROSCOPIC APPENDECTOMY N/A 02/25/2013   Procedure: APPENDECTOMY LAPAROSCOPIC;  Surgeon: Harl Bowie, MD;  Location: [place];  Service: General;  Laterality: N/A;   LAPAROSCOPY N/A 06/22/2021   Procedure: LAPAROSCOPY DIAGNOSTIC, DIVERTING LOOP COLOSTOMY, ANORECTAL EXAM UNDER ANESTHESIA, TRANSRECTAL CORE BIOPSIES.;  Surgeon:  Jeral Pinch  R, MD;  Location: [facility];  Service: Gynecology;  Laterality: N/A;    SOCIAL HISTORY: Social History   Socioeconomic History   Marital status: Married    Spouse name: Not on file   Number of children: Not on file   Years of education: Not on file   Highest education level: Not on file  Occupational History   Not on file  Tobacco Use   Smoking status: Every Day    Packs/day: 1.00    Years: 40.00    Pack years: 40.00    Types: Cigarettes   Smokeless tobacco: Never  Vaping Use   Vaping Use: Never used  Substance and Sexual Activity   Alcohol use: Yes    Comment: occ   Drug use: No   Sexual activity: Not Currently    Birth control/protection: Surgical  Other Topics Concern   Not on file  Social History Narrative   Not on file   Social Determinants of Health   Financial Resource Strain: Low Risk    Difficulty of Paying Living Expenses: Not very hard  Food Insecurity: No Food Insecurity   Worried About Running Out of Food in the Last Year: Never true   Ran Out of Food in the Last Year: Never true  Transportation Needs: No Transportation Needs   Lack of Transportation (Medical): No   Lack of Transportation (Non-Medical): No  Physical Activity: Not on file  Stress: Not on file  Social Connections: Not on file  Intimate Partner Violence: Not on file    FAMILY HISTORY: Family History  Problem Relation Age of Onset   Heart disease Father    Colon cancer Neg Hx    Breast cancer Neg Hx    Ovarian cancer Neg Hx    Pancreatic cancer Neg Hx    Prostate cancer Neg Hx    Endometrial cancer Neg Hx        Patient unaware    ALLERGIES:  is allergic to other and pneumococcal vac polyvalent.  MEDICATIONS:  Current Outpatient Medications  Medication Sig Dispense Refill   HYDROmorphone (DILAUDID) 2 MG tablet Take 1 tablet (2 mg total) by mouth every 6 (six) hours as needed for severe pain. 30 tablet 0   oxyCODONE ER (XTAMPZA ER) 13.5 MG C12A Take 13.5 mg  by mouth every 12 (twelve) hours for 14 days. 28 capsule 0   acetaminophen (TYLENOL) 500 MG tablet Take 500 mg by mouth every 6 (six) hours as needed.     albuterol (PROVENTIL HFA;VENTOLIN HFA) 108 (90 BASE) MCG/ACT inhaler Inhale 2 puffs into the lungs every 6 (six) hours as needed for wheezing or shortness of breath.     amLODipine (NORVASC) 5 MG tablet Take by mouth.     aspirin 81 MG tablet Take 81 mg by mouth daily.     benzonatate (TESSALON) 100 MG capsule TAKE 1 CAPSULE 3 TIMES A DAY AS NEEDED FOR COUGH     lidocaine-prilocaine (EMLA) cream Apply 1 application topically as needed. 30 g 0   Multiple Vitamin (MULTIVITAMIN) tablet Take 1 tablet by mouth daily.     neomycin (MYCIFRADIN) 500 MG tablet Take 1,000 mg by mouth 3 (three) times daily.     Omega-3 Fatty Acids (FISH OIL PO) Take 1 capsule by mouth daily.     ondansetron (ZOFRAN) 8 MG tablet Take 1 tablet (8 mg total) by mouth every 8 (eight) hours as needed. 30 tablet 0   oxyCODONE (OXY IR/ROXICODONE) 5 MG immediate release tablet  Take 1-2 tablets (5-10 mg total) by mouth every 6 (six) hours as needed for moderate pain, severe pain or breakthrough pain. 60 tablet 0   prochlorperazine (COMPAZINE) 10 MG tablet Take 1 tablet (10 mg total) by mouth every 6 (six) hours as needed for nausea or vomiting. 30 tablet 0   traMADol (ULTRAM) 50 MG tablet      valsartan-hydrochlorothiazide (DIOVAN-HCT) 160-12.5 MG tablet Take 1 tablet by mouth daily.     vitamin C (ASCORBIC ACID) 500 MG tablet Take 500 mg by mouth daily.     No current facility-administered medications for this visit.    REVIEW OF SYSTEMS:   Constitutional: ( - ) fevers, ( - )  chills , ( - ) night sweats Eyes: ( - ) blurriness of vision, ( - ) double vision, ( - ) watery eyes Ears, nose, mouth, throat, and face: ( - ) mucositis, ( - ) sore throat Respiratory: ( - ) cough, ( - ) dyspnea, ( - ) wheezes Cardiovascular: ( - ) palpitation, ( - ) chest discomfort, ( - ) lower  extremity swelling Gastrointestinal:  ( - ) nausea, ( - ) heartburn, ( - ) change in bowel habits Skin: ( - ) abnormal skin rashes Lymphatics: ( - ) new lymphadenopathy, ( - ) easy bruising Neurological: ( - ) numbness, ( - ) tingling, ( - ) new weaknesses Behavioral/Psych: ( - ) mood change, ( - ) new changes  All other systems were reviewed with the patient and are negative.  PHYSICAL EXAMINATION: ECOG PERFORMANCE STATUS: 1 - Symptomatic but completely ambulatory  Vitals:   08/02/21 0846  BP: 118/65  Pulse: 80  Resp: 18  Temp: (!) 97.5 F (36.4 C)  SpO2: 99%    Filed Weights   08/02/21 0846  Weight: 162 lb 12.8 oz (73.8 kg)   GENERAL: Well-appearing middle-aged African-American female, alert, no distress and comfortable SKIN: skin color, texture, turgor are normal, no rashes or significant lesions EYES: conjunctiva are pink and non-injected, sclera clear LUNGS: clear to auscultation and percussion with normal breathing effort HEART: regular rate & rhythm and no murmurs and no lower extremity edema ABDOMEN: Ostomy in place producing soft stool.  Soft, non-tender, non-distended, normal bowel sounds Musculoskeletal: no cyanosis of digits and no clubbing  PSYCH: alert & oriented x 3, fluent speech NEURO: no focal motor/sensory deficits  LABORATORY DATA:  I have reviewed the data as listed CBC Latest Ref Rng & Units 08/02/2021 07/26/2021 07/19/2021  WBC 4.0 - 10.5 K/uL 15.3(H) 10.8(H) 9.9  Hemoglobin 12.0 - 15.0 g/dL 10.4(L) 11.0(L) 13.5  Hematocrit 36.0 - 46.0 % 30.2(L) 31.9(L) 39.3  Platelets 150 - 400 K/uL 360 328 410(H)    CMP Latest Ref Rng & Units 08/02/2021 07/26/2021 07/19/2021  Glucose 70 - 99 mg/dL 142(H) 133(H) 138(H)  BUN 8 - 23 mg/dL 20 19 13   Creatinine 0.44 - 1.00 mg/dL 0.86 0.54 0.75  Sodium 135 - 145 mmol/L 134(L) 134(L) 139  Potassium 3.5 - 5.1 mmol/L 3.2(L) 3.2(L) 3.8  Chloride 98 - 111 mmol/L 94(L) 97(L) 102  CO2 22 - 32 mmol/L 26 28 25   Calcium  8.9 - 10.3 mg/dL 9.4 9.1 10.0  Total Protein 6.5 - 8.1 g/dL 7.5 7.2 8.2(H)  Total Bilirubin 0.3 - 1.2 mg/dL 0.4 0.5 0.3  Alkaline Phos 38 - 126 U/L 289(H) 175(H) 128(H)  AST 15 - 41 U/L 29 69(H) 19  ALT 0 - 44 U/L 100(H) 117(H) 13    RADIOGRAPHIC  STUDIES: IR IMAGING GUIDED PORT INSERTION  Result Date: 07/21/2021 CLINICAL DATA:  Rectal cancer, access for chemotherapy EXAM: RIGHT INTERNAL JUGULAR SINGLE LUMEN POWER PORT CATHETER INSERTION Date:  07/21/2021 07/21/2021 5:20 pm Radiologist:  Jerilynn Mages. Daryll Brod, MD Guidance:  Ultrasound and fluoroscopic MEDICATIONS: 1% lidocaine local ANESTHESIA/SEDATION: Versed 2.0 mg IV; Fentanyl 100 mcg IV; Moderate Sedation Time:  29 minutes The patient was continuously monitored during the procedure by the interventional radiology nurse under my direct supervision. FLUOROSCOPY TIME:  0 minutes, 42 seconds (2.5 mGy) COMPLICATIONS: None immediate. CONTRAST:  None. PROCEDURE: Informed consent was obtained from the patient following explanation of the procedure, risks, benefits and alternatives. The patient understands, agrees and consents for the procedure. All questions were addressed. A time out was performed. Maximal barrier sterile technique utilized including caps, mask, sterile gowns, sterile gloves, large sterile drape, hand hygiene, and 2% chlorhexidine scrub. Under sterile conditions and local anesthesia, right internal jugular micropuncture venous access was performed. Access was performed with ultrasound. Images were obtained for documentation of the patent right internal jugular vein. A guide wire was inserted followed by a transitional dilator. This allowed insertion of a guide wire and catheter into the IVC. Measurements were obtained from the SVC / RA junction back to the right IJ venotomy site. In the right infraclavicular chest, a subcutaneous pocket was created over the second anterior rib. This was done under sterile conditions and local anesthesia. 1%  lidocaine with epinephrine was utilized for this. A 2.5 cm incision was made in the skin. Blunt dissection was performed to create a subcutaneous pocket over the right pectoralis major muscle. The pocket was flushed with saline vigorously. There was adequate hemostasis. The port catheter was assembled and checked for leakage. The port catheter was secured in the pocket with two retention sutures. The tubing was tunneled subcutaneously to the right venotomy site and inserted into the SVC/RA junction through a valved peel-away sheath. Position was confirmed with fluoroscopy. Images were obtained for documentation. The patient tolerated the procedure well. No immediate complications. Incisions were closed in a two layer fashion with 4 - 0 Vicryl suture. Dermabond was applied to the skin. The port catheter was accessed, blood was aspirated followed by saline and heparin flushes. Needle was removed. A dry sterile dressing was applied. IMPRESSION: Ultrasound and fluoroscopically guided right internal jugular single lumen power port catheter insertion. Tip in the SVC/RA junction. Catheter ready for use. Electronically Signed   By: Jerilynn Mages.  Shick M.D.   On: 07/21/2021 17:25    ASSESSMENT & PLAN Mariah Schultz 65 y.o. female with medical history significant for squamous cell cancer of unclear etiology who presents for a follow up visit.   Today we discussed the findings on pathology and the steps moving forward.  At this time this appears to be a squamous cell carcinoma of unclear etiology.  Based on the radiographic review and the surgical procedure there is no clear etiology for this patient's cancer.  The patient underwent a loop colostomy with biopsy of the lesion.  At this time I recommend that we pursue chemoradiation with cisplatin 40 mg per metered squared and combination with radiation therapy.  We will continue cisplatin for the duration of radiation.  The patient voiced understanding of this plan moving  forward.  We started chemotherapy on 07/20/2021 with concurrent radiation.  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 --RTC in 2 weeks with interval weekly cisplatin treatments  #Pain Control --starting Xtampza 13.5mg  q12H  for long act pain control --continue oxycodone 5-10mg  q6H as prescribed --dilaudid 2 mg q6H PRN for breakthrough  #Supportive Care -- chemotherapy education complete -- port placed -- zofran 8mg  q8H PRN and compazine 10mg  PO q6H for nausea -- EMLA cream for port -- pain medication as above  No orders of the defined types were placed in this encounter.  All questions were answered. The patient knows to call the clinic with any problems, questions or concerns.  A total of more than 30 minutes were spent on this encounter with face-to-face time and non-face-to-face time, including preparing to see the patient, ordering tests and/or medications, counseling the patient and coordination of care as outlined above.   Ledell Peoples, MD Department of Hematology/Oncology [place] at [facility] Phone: [phone] Pager: [phone] Email: [email or web address]  08/08/2021 6:10 PM

## 2021-08-03 ENCOUNTER — Ambulatory Visit: Payer: Medicare HMO

## 2021-08-03 ENCOUNTER — Ambulatory Visit
Admission: RE | Admit: 2021-08-03 | Discharge: 2021-08-03 | Disposition: A | Discharge status: Home / Self Care | Payer: Medicare HMO | Source: Ambulatory Visit | Attending: Radiation Oncology | Admitting: Radiation Oncology

## 2021-08-03 DIAGNOSIS — C763 Malignant neoplasm of pelvis: Secondary | ICD-10-CM | POA: Diagnosis not present

## 2021-08-04 ENCOUNTER — Other Ambulatory Visit: Payer: Self-pay

## 2021-08-04 ENCOUNTER — Ambulatory Visit: Payer: Medicare HMO

## 2021-08-04 ENCOUNTER — Ambulatory Visit
Admission: RE | Admit: 2021-08-04 | Discharge: 2021-08-04 | Disposition: A | Discharge status: Home / Self Care | Payer: Medicare HMO | Source: Ambulatory Visit | Attending: Radiation Oncology | Admitting: Radiation Oncology

## 2021-08-04 DIAGNOSIS — C763 Malignant neoplasm of pelvis: Secondary | ICD-10-CM | POA: Diagnosis not present

## 2021-08-05 ENCOUNTER — Ambulatory Visit: Payer: Medicare HMO

## 2021-08-05 ENCOUNTER — Other Ambulatory Visit: Payer: Self-pay

## 2021-08-05 ENCOUNTER — Ambulatory Visit
Admission: RE | Admit: 2021-08-05 | Discharge: 2021-08-05 | Disposition: A | Discharge status: Home / Self Care | Payer: Medicare HMO | Source: Ambulatory Visit | Attending: Radiation Oncology | Admitting: Radiation Oncology

## 2021-08-05 DIAGNOSIS — Z79899 Other long term (current) drug therapy: Secondary | ICD-10-CM | POA: Diagnosis not present

## 2021-08-05 DIAGNOSIS — C763 Malignant neoplasm of pelvis: Secondary | ICD-10-CM | POA: Insufficient documentation

## 2021-08-05 DIAGNOSIS — F17210 Nicotine dependence, cigarettes, uncomplicated: Secondary | ICD-10-CM | POA: Insufficient documentation

## 2021-08-05 DIAGNOSIS — C19 Malignant neoplasm of rectosigmoid junction: Secondary | ICD-10-CM | POA: Diagnosis not present

## 2021-08-05 DIAGNOSIS — R140 Abdominal distension (gaseous): Secondary | ICD-10-CM | POA: Insufficient documentation

## 2021-08-05 DIAGNOSIS — R918 Other nonspecific abnormal finding of lung field: Secondary | ICD-10-CM | POA: Diagnosis not present

## 2021-08-05 DIAGNOSIS — Z933 Colostomy status: Secondary | ICD-10-CM | POA: Diagnosis not present

## 2021-08-05 DIAGNOSIS — Z5111 Encounter for antineoplastic chemotherapy: Secondary | ICD-10-CM | POA: Diagnosis not present

## 2021-08-05 DIAGNOSIS — Z8249 Family history of ischemic heart disease and other diseases of the circulatory system: Secondary | ICD-10-CM | POA: Insufficient documentation

## 2021-08-05 DIAGNOSIS — R5383 Other fatigue: Secondary | ICD-10-CM | POA: Diagnosis not present

## 2021-08-05 DIAGNOSIS — R6881 Early satiety: Secondary | ICD-10-CM | POA: Diagnosis not present

## 2021-08-06 ENCOUNTER — Ambulatory Visit
Admission: RE | Admit: 2021-08-06 | Discharge: 2021-08-06 | Disposition: A | Discharge status: Home / Self Care | Payer: Medicare HMO | Source: Ambulatory Visit | Attending: Radiation Oncology | Admitting: Radiation Oncology

## 2021-08-06 ENCOUNTER — Ambulatory Visit: Payer: Medicare HMO

## 2021-08-06 DIAGNOSIS — R6881 Early satiety: Secondary | ICD-10-CM | POA: Diagnosis not present

## 2021-08-06 DIAGNOSIS — Z5111 Encounter for antineoplastic chemotherapy: Secondary | ICD-10-CM | POA: Diagnosis not present

## 2021-08-06 DIAGNOSIS — C19 Malignant neoplasm of rectosigmoid junction: Secondary | ICD-10-CM | POA: Diagnosis not present

## 2021-08-06 DIAGNOSIS — R5383 Other fatigue: Secondary | ICD-10-CM | POA: Diagnosis not present

## 2021-08-06 DIAGNOSIS — Z79899 Other long term (current) drug therapy: Secondary | ICD-10-CM | POA: Diagnosis not present

## 2021-08-06 DIAGNOSIS — R918 Other nonspecific abnormal finding of lung field: Secondary | ICD-10-CM | POA: Diagnosis not present

## 2021-08-06 DIAGNOSIS — C763 Malignant neoplasm of pelvis: Secondary | ICD-10-CM | POA: Diagnosis not present

## 2021-08-06 DIAGNOSIS — F17210 Nicotine dependence, cigarettes, uncomplicated: Secondary | ICD-10-CM | POA: Diagnosis not present

## 2021-08-06 DIAGNOSIS — R140 Abdominal distension (gaseous): Secondary | ICD-10-CM | POA: Diagnosis not present

## 2021-08-06 MED FILL — Dexamethasone Sodium Phosphate Inj 100 MG/10ML: INTRAMUSCULAR | Qty: 1 | Status: AC

## 2021-08-06 MED FILL — Fosaprepitant Dimeglumine For IV Infusion 150 MG (Base Eq): INTRAVENOUS | Qty: 5 | Status: AC

## 2021-08-08 ENCOUNTER — Encounter: Payer: Self-pay | Admitting: Hematology and Oncology

## 2021-08-08 DIAGNOSIS — I10 Essential (primary) hypertension: Secondary | ICD-10-CM | POA: Diagnosis not present

## 2021-08-08 DIAGNOSIS — C20 Malignant neoplasm of rectum: Secondary | ICD-10-CM | POA: Diagnosis not present

## 2021-08-08 DIAGNOSIS — F17210 Nicotine dependence, cigarettes, uncomplicated: Secondary | ICD-10-CM | POA: Diagnosis not present

## 2021-08-08 DIAGNOSIS — Z433 Encounter for attention to colostomy: Secondary | ICD-10-CM | POA: Diagnosis not present

## 2021-08-08 DIAGNOSIS — U071 COVID-19: Secondary | ICD-10-CM | POA: Diagnosis not present

## 2021-08-08 DIAGNOSIS — Z483 Aftercare following surgery for neoplasm: Secondary | ICD-10-CM | POA: Diagnosis not present

## 2021-08-08 DIAGNOSIS — R918 Other nonspecific abnormal finding of lung field: Secondary | ICD-10-CM | POA: Diagnosis not present

## 2021-08-08 DIAGNOSIS — E876 Hypokalemia: Secondary | ICD-10-CM | POA: Diagnosis not present

## 2021-08-08 DIAGNOSIS — K56600 Partial intestinal obstruction, unspecified as to cause: Secondary | ICD-10-CM | POA: Diagnosis not present

## 2021-08-09 ENCOUNTER — Ambulatory Visit
Admission: RE | Admit: 2021-08-09 | Discharge: 2021-08-09 | Disposition: A | Discharge status: Home / Self Care | Payer: Medicare HMO | Source: Ambulatory Visit | Attending: Radiation Oncology | Admitting: Radiation Oncology

## 2021-08-09 ENCOUNTER — Inpatient Hospital Stay: Payer: Medicare HMO

## 2021-08-09 ENCOUNTER — Other Ambulatory Visit: Payer: Medicare HMO

## 2021-08-09 ENCOUNTER — Other Ambulatory Visit: Payer: Self-pay

## 2021-08-09 ENCOUNTER — Inpatient Hospital Stay: Payer: Medicare HMO | Attending: Hematology and Oncology

## 2021-08-09 VITALS — BP 121/67 | HR 71 | Temp 99.0°F

## 2021-08-09 DIAGNOSIS — F17210 Nicotine dependence, cigarettes, uncomplicated: Secondary | ICD-10-CM | POA: Insufficient documentation

## 2021-08-09 DIAGNOSIS — Z5111 Encounter for antineoplastic chemotherapy: Secondary | ICD-10-CM | POA: Diagnosis not present

## 2021-08-09 DIAGNOSIS — R918 Other nonspecific abnormal finding of lung field: Secondary | ICD-10-CM | POA: Diagnosis not present

## 2021-08-09 DIAGNOSIS — C763 Malignant neoplasm of pelvis: Secondary | ICD-10-CM

## 2021-08-09 DIAGNOSIS — R5383 Other fatigue: Secondary | ICD-10-CM | POA: Insufficient documentation

## 2021-08-09 DIAGNOSIS — R140 Abdominal distension (gaseous): Secondary | ICD-10-CM | POA: Insufficient documentation

## 2021-08-09 DIAGNOSIS — C19 Malignant neoplasm of rectosigmoid junction: Secondary | ICD-10-CM | POA: Insufficient documentation

## 2021-08-09 DIAGNOSIS — Z79899 Other long term (current) drug therapy: Secondary | ICD-10-CM | POA: Diagnosis not present

## 2021-08-09 DIAGNOSIS — R6881 Early satiety: Secondary | ICD-10-CM | POA: Diagnosis not present

## 2021-08-09 DIAGNOSIS — Z95828 Presence of other vascular implants and grafts: Secondary | ICD-10-CM | POA: Insufficient documentation

## 2021-08-09 DIAGNOSIS — Z8249 Family history of ischemic heart disease and other diseases of the circulatory system: Secondary | ICD-10-CM | POA: Insufficient documentation

## 2021-08-09 DIAGNOSIS — Z933 Colostomy status: Secondary | ICD-10-CM | POA: Insufficient documentation

## 2021-08-09 LAB — CMP (CANCER CENTER ONLY)
ALT: 37 U/L (ref 0–44)
AST: 19 U/L (ref 15–41)
Albumin: 2.4 g/dL — ABNORMAL LOW (ref 3.5–5.0)
Alkaline Phosphatase: 205 U/L — ABNORMAL HIGH (ref 38–126)
Anion gap: 11 (ref 5–15)
BUN: 23 mg/dL (ref 8–23)
CO2: 29 mmol/L (ref 22–32)
Calcium: 8.9 mg/dL (ref 8.9–10.3)
Chloride: 96 mmol/L — ABNORMAL LOW (ref 98–111)
Creatinine: 0.81 mg/dL (ref 0.44–1.00)
GFR, Estimated: >60 mL/min (ref >60)
Glucose, Bld: 161 mg/dL — ABNORMAL HIGH (ref 70–99)
Potassium: 3.1 mmol/L — ABNORMAL LOW (ref 3.5–5.1)
Sodium: 136 mmol/L (ref 135–145)
Total Bilirubin: 0.3 mg/dL (ref 0.3–1.2)
Total Protein: 6.9 g/dL (ref 6.5–8.1)

## 2021-08-09 LAB — CBC WITH DIFFERENTIAL (CANCER CENTER ONLY)
Abs Immature Granulocytes: 0.04 10*3/uL (ref 0.00–0.07)
Basophils Absolute: 0 10*3/uL (ref 0.0–0.1)
Basophils Relative: 0 %
Eosinophils Absolute: 0 10*3/uL (ref 0.0–0.5)
Eosinophils Relative: 0 %
HCT: 26.6 % — ABNORMAL LOW (ref 36.0–46.0)
Hemoglobin: 9.2 g/dL — ABNORMAL LOW (ref 12.0–15.0)
Immature Granulocytes: 1 %
Lymphocytes Relative: 2 %
Lymphs Abs: 0.2 10*3/uL — ABNORMAL LOW (ref 0.7–4.0)
MCH: 30.7 pg (ref 26.0–34.0)
MCHC: 34.6 g/dL (ref 30.0–36.0)
MCV: 88.7 fL (ref 80.0–100.0)
Monocytes Absolute: 0.5 10*3/uL (ref 0.1–1.0)
Monocytes Relative: 7 %
Neutro Abs: 7 10*3/uL (ref 1.7–7.7)
Neutrophils Relative %: 90 %
Platelet Count: 360 10*3/uL (ref 150–400)
RBC: 3 MIL/uL — ABNORMAL LOW (ref 3.87–5.11)
RDW: 13.9 % (ref 11.5–15.5)
WBC Count: 7.7 10*3/uL (ref 4.0–10.5)
nRBC: 0 % (ref 0.0–0.2)

## 2021-08-09 LAB — MAGNESIUM: Magnesium: 1.7 mg/dL (ref 1.7–2.4)

## 2021-08-09 MED ORDER — SODIUM CHLORIDE 0.9 % IV SOLN
40.0000 mg/m2 | Freq: Once | INTRAVENOUS | Status: AC
  Administered 2021-08-09: 73 mg via INTRAVENOUS
  Filled 2021-08-09: qty 73

## 2021-08-09 MED ORDER — SODIUM CHLORIDE 0.9 % IV SOLN: Freq: Once | INTRAVENOUS | Status: AC

## 2021-08-09 MED ORDER — HEPARIN SOD (PORK) LOCK FLUSH 100 UNIT/ML IV SOLN: 500.0000 [IU] | Freq: Once | INTRAVENOUS | Status: DC | PRN

## 2021-08-09 MED ORDER — MAGNESIUM SULFATE 2 GM/50ML IV SOLN
2.0000 g | Freq: Once | INTRAVENOUS | Status: AC
  Administered 2021-08-09: 2 g via INTRAVENOUS
  Filled 2021-08-09: qty 50

## 2021-08-09 MED ORDER — SODIUM CHLORIDE 0.9% FLUSH: 10.0000 mL | INTRAVENOUS | Status: DC | PRN

## 2021-08-09 MED ORDER — SODIUM CHLORIDE 0.9 % IV SOLN
150.0000 mg | Freq: Once | INTRAVENOUS | Status: AC
  Administered 2021-08-09: 150 mg via INTRAVENOUS
  Filled 2021-08-09: qty 150

## 2021-08-09 MED ORDER — PALONOSETRON HCL INJECTION 0.25 MG/5ML
0.2500 mg | Freq: Once | INTRAVENOUS | Status: AC
  Administered 2021-08-09: 0.25 mg via INTRAVENOUS
  Filled 2021-08-09: qty 5

## 2021-08-09 MED ORDER — POTASSIUM CHLORIDE IN NACL 20-0.9 MEQ/L-% IV SOLN
Freq: Once | INTRAVENOUS | Status: AC
  Filled 2021-08-09: qty 1000

## 2021-08-09 MED ORDER — SODIUM CHLORIDE 0.9% FLUSH
10.0000 mL | Freq: Once | INTRAVENOUS | Status: AC
  Administered 2021-08-09: 10 mL

## 2021-08-09 MED ORDER — SODIUM CHLORIDE 0.9 % IV SOLN
10.0000 mg | Freq: Once | INTRAVENOUS | Status: AC
  Administered 2021-08-09: 10 mg via INTRAVENOUS
  Filled 2021-08-09: qty 10

## 2021-08-09 NOTE — Progress Notes (Signed)
Nutrition Follow-up:  Patient with SCC of unclear etiology, questionable colorectal malignancy, tuboovarian and peritoneal.  S/p diverting loop colostomy 10/18.  Patient on radiation and chemotherapy.  Met with patient during infusion.  She reports that appetite is a little bit better. Pain is also better.  Pain medication has been adjusted.  Reports that her food taste change.  Currently foods that are working well are shrimp/seafood pastas, pizza, trail mix, blueberry yogurt.  Also drinking 2-3 ensure complete shakes per day.     Medications: reviewed  Labs: reviewed  Anthropometrics:   Weight 162 lb 12.8 oz on 11/28  165 lb 8 oz on 11/21 170 lb on 9/30   NUTRITION DIAGNOSIS: Inadequate oral intake slight improvement   INTERVENTION:  Continue oral nutrition supplement at least 2-3 times per day, 350 calorie shake Continue eating good sources of protein Maximize intake on days when appetite is good.     MONITORING, EVALUATION, GOAL: weight trends, intake   NEXT VISIT: Monday, Dec 19 during infusion  Nancie Bocanegra B. Zenia Resides, [place], [place] Registered Dietitian [phone] (mobile)

## 2021-08-09 NOTE — Progress Notes (Signed)
Patient to rad onc at 0913.  Treatment given per orders. Patient tolerated it well without problems. Vitals stable and discharged home from clinic ambulatory. Follow up as scheduled.

## 2021-08-09 NOTE — Patient Instructions (Signed)
[facility] MEDICAL ONCOLOGY  Discharge Instructions: Thank you for choosing [facility] to provide your oncology and hematology care.   If you have a lab appointment with the Cancer Center, please go directly to the Cancer Center and check in at the registration area.   Wear comfortable clothing and clothing appropriate for easy access to any Portacath or PICC line.   We strive to give you quality time with your provider. You may need to reschedule your appointment if you arrive late (15 or more minutes).  Arriving late affects you and other patients whose appointments are after yours.  Also, if you miss three or more appointments without notifying the office, you may be dismissed from the clinic at the provider's discretion.      For prescription refill requests, have your pharmacy contact our office and allow 72 hours for refills to be completed.    Today you received the following chemotherapy and/or immunotherapy agents : Cisplatin    To help prevent nausea and vomiting after your treatment, we encourage you to take your nausea medication as directed.  BELOW ARE SYMPTOMS THAT SHOULD BE REPORTED IMMEDIATELY: *FEVER GREATER THAN 100.4 F (38 C) OR HIGHER *CHILLS OR SWEATING *NAUSEA AND VOMITING THAT IS NOT CONTROLLED WITH YOUR NAUSEA MEDICATION *UNUSUAL SHORTNESS OF BREATH *UNUSUAL BRUISING OR BLEEDING *URINARY PROBLEMS (pain or burning when urinating, or frequent urination) *BOWEL PROBLEMS (unusual diarrhea, constipation, pain near the anus) TENDERNESS IN MOUTH AND THROAT WITH OR WITHOUT PRESENCE OF ULCERS (sore throat, sores in mouth, or a toothache) UNUSUAL RASH, SWELLING OR PAIN  UNUSUAL VAGINAL DISCHARGE OR ITCHING   Items with * indicate a potential emergency and should be followed up as soon as possible or go to the Emergency Department if any problems should occur.  Please show the CHEMOTHERAPY ALERT CARD or IMMUNOTHERAPY ALERT CARD at check-in to  the Emergency Department and triage nurse.  Should you have questions after your visit or need to cancel or reschedule your appointment, please contact [facility]ICAL [facility]COLOG[facility]  Dept: [phone]  and follow the prompts.  Office hours are 8:00 a.m. to 4:30 p.m. Monday - Friday. Please note that voicemails left after 4:00 p.m. may not be returned until the following business day.  We are closed weekends and major holidays. You have access to a nurse at all times for urgent questions. Please call the main number to the clinic Dept: [phone] and follow the prompts.   For any non-urgent questions, you may also contact your provider using MyChart. We now offer e-Visits for anyone 18 and older to request care online for non-urgent symptoms. For details visit [email or web address].   Also download the MyChart app! Go to the app store, search "MyChart", open the app, select [facility], and log in with your MyChart username and password.  Due to Covid, a mask is required upon entering the hospital/clinic. If you do not have a mask, one will be given to you upon arrival. For doctor visits, patients may have 1 support person aged 18 or older with them. For treatment visits, patients cannot have anyone with them due to current Covid guidelines and our immunocompromised population.   

## 2021-08-10 ENCOUNTER — Ambulatory Visit
Admission: RE | Admit: 2021-08-10 | Discharge: 2021-08-10 | Disposition: A | Discharge status: Home / Self Care | Payer: Medicare HMO | Source: Ambulatory Visit | Attending: Radiation Oncology | Admitting: Radiation Oncology

## 2021-08-10 ENCOUNTER — Other Ambulatory Visit: Payer: Self-pay | Admitting: Hematology and Oncology

## 2021-08-10 DIAGNOSIS — R5383 Other fatigue: Secondary | ICD-10-CM | POA: Diagnosis not present

## 2021-08-10 DIAGNOSIS — Z79899 Other long term (current) drug therapy: Secondary | ICD-10-CM | POA: Diagnosis not present

## 2021-08-10 DIAGNOSIS — C19 Malignant neoplasm of rectosigmoid junction: Secondary | ICD-10-CM | POA: Diagnosis not present

## 2021-08-10 DIAGNOSIS — C763 Malignant neoplasm of pelvis: Secondary | ICD-10-CM | POA: Diagnosis not present

## 2021-08-10 DIAGNOSIS — F17210 Nicotine dependence, cigarettes, uncomplicated: Secondary | ICD-10-CM | POA: Diagnosis not present

## 2021-08-10 DIAGNOSIS — R6881 Early satiety: Secondary | ICD-10-CM | POA: Diagnosis not present

## 2021-08-10 DIAGNOSIS — Z5111 Encounter for antineoplastic chemotherapy: Secondary | ICD-10-CM | POA: Diagnosis not present

## 2021-08-10 DIAGNOSIS — R140 Abdominal distension (gaseous): Secondary | ICD-10-CM | POA: Diagnosis not present

## 2021-08-10 DIAGNOSIS — R918 Other nonspecific abnormal finding of lung field: Secondary | ICD-10-CM | POA: Diagnosis not present

## 2021-08-10 MED ORDER — HYDROMORPHONE HCL 2 MG PO TABS: 2.0000 mg | ORAL_TABLET | Freq: Four times a day (QID) | ORAL | 0 refills | Status: DC | PRN

## 2021-08-11 ENCOUNTER — Other Ambulatory Visit: Payer: Self-pay

## 2021-08-11 ENCOUNTER — Ambulatory Visit
Admission: RE | Admit: 2021-08-11 | Discharge: 2021-08-11 | Disposition: A | Discharge status: Home / Self Care | Payer: Medicare HMO | Source: Ambulatory Visit | Attending: Radiation Oncology | Admitting: Radiation Oncology

## 2021-08-11 DIAGNOSIS — C19 Malignant neoplasm of rectosigmoid junction: Secondary | ICD-10-CM | POA: Diagnosis not present

## 2021-08-11 DIAGNOSIS — Z79899 Other long term (current) drug therapy: Secondary | ICD-10-CM | POA: Diagnosis not present

## 2021-08-11 DIAGNOSIS — R918 Other nonspecific abnormal finding of lung field: Secondary | ICD-10-CM | POA: Diagnosis not present

## 2021-08-11 DIAGNOSIS — C763 Malignant neoplasm of pelvis: Secondary | ICD-10-CM | POA: Diagnosis not present

## 2021-08-11 DIAGNOSIS — Z5111 Encounter for antineoplastic chemotherapy: Secondary | ICD-10-CM | POA: Diagnosis not present

## 2021-08-11 DIAGNOSIS — R6881 Early satiety: Secondary | ICD-10-CM | POA: Diagnosis not present

## 2021-08-11 DIAGNOSIS — R5383 Other fatigue: Secondary | ICD-10-CM | POA: Diagnosis not present

## 2021-08-11 DIAGNOSIS — R140 Abdominal distension (gaseous): Secondary | ICD-10-CM | POA: Diagnosis not present

## 2021-08-11 DIAGNOSIS — F17210 Nicotine dependence, cigarettes, uncomplicated: Secondary | ICD-10-CM | POA: Diagnosis not present

## 2021-08-12 ENCOUNTER — Ambulatory Visit
Admission: RE | Admit: 2021-08-12 | Discharge: 2021-08-12 | Disposition: A | Discharge status: Home / Self Care | Payer: Medicare HMO | Source: Ambulatory Visit | Attending: Radiation Oncology | Admitting: Radiation Oncology

## 2021-08-12 ENCOUNTER — Other Ambulatory Visit: Payer: Self-pay | Admitting: Hematology and Oncology

## 2021-08-12 DIAGNOSIS — Z5111 Encounter for antineoplastic chemotherapy: Secondary | ICD-10-CM | POA: Diagnosis not present

## 2021-08-12 DIAGNOSIS — R5383 Other fatigue: Secondary | ICD-10-CM | POA: Diagnosis not present

## 2021-08-12 DIAGNOSIS — F17210 Nicotine dependence, cigarettes, uncomplicated: Secondary | ICD-10-CM | POA: Diagnosis not present

## 2021-08-12 DIAGNOSIS — Z79899 Other long term (current) drug therapy: Secondary | ICD-10-CM | POA: Diagnosis not present

## 2021-08-12 DIAGNOSIS — C763 Malignant neoplasm of pelvis: Secondary | ICD-10-CM | POA: Diagnosis not present

## 2021-08-12 DIAGNOSIS — R140 Abdominal distension (gaseous): Secondary | ICD-10-CM | POA: Diagnosis not present

## 2021-08-12 DIAGNOSIS — R918 Other nonspecific abnormal finding of lung field: Secondary | ICD-10-CM | POA: Diagnosis not present

## 2021-08-12 DIAGNOSIS — C19 Malignant neoplasm of rectosigmoid junction: Secondary | ICD-10-CM | POA: Diagnosis not present

## 2021-08-12 DIAGNOSIS — R6881 Early satiety: Secondary | ICD-10-CM | POA: Diagnosis not present

## 2021-08-13 ENCOUNTER — Other Ambulatory Visit: Payer: Self-pay

## 2021-08-13 ENCOUNTER — Ambulatory Visit
Admission: RE | Admit: 2021-08-13 | Discharge: 2021-08-13 | Disposition: A | Discharge status: Home / Self Care | Payer: Medicare HMO | Source: Ambulatory Visit | Attending: Radiation Oncology | Admitting: Radiation Oncology

## 2021-08-13 DIAGNOSIS — R140 Abdominal distension (gaseous): Secondary | ICD-10-CM | POA: Diagnosis not present

## 2021-08-13 DIAGNOSIS — C763 Malignant neoplasm of pelvis: Secondary | ICD-10-CM | POA: Diagnosis not present

## 2021-08-13 DIAGNOSIS — R918 Other nonspecific abnormal finding of lung field: Secondary | ICD-10-CM | POA: Diagnosis not present

## 2021-08-13 DIAGNOSIS — F17210 Nicotine dependence, cigarettes, uncomplicated: Secondary | ICD-10-CM | POA: Diagnosis not present

## 2021-08-13 DIAGNOSIS — R5383 Other fatigue: Secondary | ICD-10-CM | POA: Diagnosis not present

## 2021-08-13 DIAGNOSIS — Z5111 Encounter for antineoplastic chemotherapy: Secondary | ICD-10-CM | POA: Diagnosis not present

## 2021-08-13 DIAGNOSIS — R6881 Early satiety: Secondary | ICD-10-CM | POA: Diagnosis not present

## 2021-08-13 DIAGNOSIS — C19 Malignant neoplasm of rectosigmoid junction: Secondary | ICD-10-CM | POA: Diagnosis not present

## 2021-08-13 DIAGNOSIS — Z79899 Other long term (current) drug therapy: Secondary | ICD-10-CM | POA: Diagnosis not present

## 2021-08-16 ENCOUNTER — Ambulatory Visit
Admission: RE | Admit: 2021-08-16 | Discharge: 2021-08-16 | Disposition: A | Discharge status: Home / Self Care | Payer: Medicare HMO | Source: Ambulatory Visit | Attending: Radiation Oncology | Admitting: Radiation Oncology

## 2021-08-16 ENCOUNTER — Other Ambulatory Visit: Payer: Self-pay

## 2021-08-16 DIAGNOSIS — R918 Other nonspecific abnormal finding of lung field: Secondary | ICD-10-CM | POA: Diagnosis not present

## 2021-08-16 DIAGNOSIS — C763 Malignant neoplasm of pelvis: Secondary | ICD-10-CM | POA: Diagnosis not present

## 2021-08-16 DIAGNOSIS — Z5111 Encounter for antineoplastic chemotherapy: Secondary | ICD-10-CM | POA: Diagnosis not present

## 2021-08-16 DIAGNOSIS — Z79899 Other long term (current) drug therapy: Secondary | ICD-10-CM | POA: Diagnosis not present

## 2021-08-16 DIAGNOSIS — R140 Abdominal distension (gaseous): Secondary | ICD-10-CM | POA: Diagnosis not present

## 2021-08-16 DIAGNOSIS — R5383 Other fatigue: Secondary | ICD-10-CM | POA: Diagnosis not present

## 2021-08-16 DIAGNOSIS — C19 Malignant neoplasm of rectosigmoid junction: Secondary | ICD-10-CM | POA: Diagnosis not present

## 2021-08-16 DIAGNOSIS — R6881 Early satiety: Secondary | ICD-10-CM | POA: Diagnosis not present

## 2021-08-16 DIAGNOSIS — F17210 Nicotine dependence, cigarettes, uncomplicated: Secondary | ICD-10-CM | POA: Diagnosis not present

## 2021-08-16 MED FILL — Fosaprepitant Dimeglumine For IV Infusion 150 MG (Base Eq): INTRAVENOUS | Qty: 5 | Status: AC

## 2021-08-16 MED FILL — Dexamethasone Sodium Phosphate Inj 100 MG/10ML: INTRAMUSCULAR | Qty: 1 | Status: AC

## 2021-08-17 ENCOUNTER — Inpatient Hospital Stay: Payer: Medicare HMO

## 2021-08-17 ENCOUNTER — Ambulatory Visit
Admission: RE | Admit: 2021-08-17 | Discharge: 2021-08-17 | Disposition: A | Discharge status: Home / Self Care | Payer: Medicare HMO | Source: Ambulatory Visit | Attending: Radiation Oncology | Admitting: Radiation Oncology

## 2021-08-17 ENCOUNTER — Inpatient Hospital Stay: Payer: Medicare HMO | Admitting: Physician Assistant

## 2021-08-17 ENCOUNTER — Other Ambulatory Visit: Payer: Medicare HMO

## 2021-08-17 ENCOUNTER — Inpatient Hospital Stay (HOSPITAL_BASED_OUTPATIENT_CLINIC_OR_DEPARTMENT_OTHER): Payer: Medicare HMO

## 2021-08-17 VITALS — BP 119/80 | HR 81 | Temp 97.4°F | Resp 17 | Wt 158.1 lb

## 2021-08-17 DIAGNOSIS — Z95828 Presence of other vascular implants and grafts: Secondary | ICD-10-CM

## 2021-08-17 DIAGNOSIS — R140 Abdominal distension (gaseous): Secondary | ICD-10-CM | POA: Diagnosis not present

## 2021-08-17 DIAGNOSIS — C763 Malignant neoplasm of pelvis: Secondary | ICD-10-CM | POA: Diagnosis not present

## 2021-08-17 DIAGNOSIS — R5383 Other fatigue: Secondary | ICD-10-CM | POA: Diagnosis not present

## 2021-08-17 DIAGNOSIS — R918 Other nonspecific abnormal finding of lung field: Secondary | ICD-10-CM | POA: Diagnosis not present

## 2021-08-17 DIAGNOSIS — C19 Malignant neoplasm of rectosigmoid junction: Secondary | ICD-10-CM | POA: Diagnosis not present

## 2021-08-17 DIAGNOSIS — Z5111 Encounter for antineoplastic chemotherapy: Secondary | ICD-10-CM | POA: Diagnosis not present

## 2021-08-17 DIAGNOSIS — F17210 Nicotine dependence, cigarettes, uncomplicated: Secondary | ICD-10-CM | POA: Diagnosis not present

## 2021-08-17 DIAGNOSIS — R6881 Early satiety: Secondary | ICD-10-CM | POA: Diagnosis not present

## 2021-08-17 DIAGNOSIS — Z79899 Other long term (current) drug therapy: Secondary | ICD-10-CM | POA: Diagnosis not present

## 2021-08-17 LAB — CBC WITH DIFFERENTIAL (CANCER CENTER ONLY)
Abs Immature Granulocytes: 0.01 10*3/uL (ref 0.00–0.07)
Basophils Absolute: 0 10*3/uL (ref 0.0–0.1)
Basophils Relative: 0 %
Eosinophils Absolute: 0 10*3/uL (ref 0.0–0.5)
Eosinophils Relative: 1 %
HCT: 29 % — ABNORMAL LOW (ref 36.0–46.0)
Hemoglobin: 9.8 g/dL — ABNORMAL LOW (ref 12.0–15.0)
Immature Granulocytes: 0 %
Lymphocytes Relative: 5 %
Lymphs Abs: 0.2 10*3/uL — ABNORMAL LOW (ref 0.7–4.0)
MCH: 30.5 pg (ref 26.0–34.0)
MCHC: 33.8 g/dL (ref 30.0–36.0)
MCV: 90.3 fL (ref 80.0–100.0)
Monocytes Absolute: 0.3 10*3/uL (ref 0.1–1.0)
Monocytes Relative: 8 %
Neutro Abs: 3.3 10*3/uL (ref 1.7–7.7)
Neutrophils Relative %: 86 %
Platelet Count: 276 10*3/uL (ref 150–400)
RBC: 3.21 MIL/uL — ABNORMAL LOW (ref 3.87–5.11)
RDW: 14.3 % (ref 11.5–15.5)
WBC Count: 3.8 10*3/uL — ABNORMAL LOW (ref 4.0–10.5)
nRBC: 0 % (ref 0.0–0.2)

## 2021-08-17 LAB — CMP (CANCER CENTER ONLY)
ALT: 20 U/L (ref 0–44)
AST: 17 U/L (ref 15–41)
Albumin: 2.8 g/dL — ABNORMAL LOW (ref 3.5–5.0)
Alkaline Phosphatase: 179 U/L — ABNORMAL HIGH (ref 38–126)
Anion gap: 12 (ref 5–15)
BUN: 28 mg/dL — ABNORMAL HIGH (ref 8–23)
CO2: 26 mmol/L (ref 22–32)
Calcium: 9.2 mg/dL (ref 8.9–10.3)
Chloride: 100 mmol/L (ref 98–111)
Creatinine: 0.83 mg/dL (ref 0.44–1.00)
GFR, Estimated: >60 mL/min (ref >60)
Glucose, Bld: 122 mg/dL — ABNORMAL HIGH (ref 70–99)
Potassium: 3.5 mmol/L (ref 3.5–5.1)
Sodium: 138 mmol/L (ref 135–145)
Total Bilirubin: 0.2 mg/dL — ABNORMAL LOW (ref 0.3–1.2)
Total Protein: 7.2 g/dL (ref 6.5–8.1)

## 2021-08-17 MED ORDER — SODIUM CHLORIDE 0.9 % IV SOLN
150.0000 mg | Freq: Once | INTRAVENOUS | Status: AC
  Administered 2021-08-17: 150 mg via INTRAVENOUS
  Filled 2021-08-17: qty 150

## 2021-08-17 MED ORDER — SODIUM CHLORIDE 0.9% FLUSH
10.0000 mL | Freq: Once | INTRAVENOUS | Status: AC
  Administered 2021-08-17: 10 mL

## 2021-08-17 MED ORDER — SODIUM CHLORIDE 0.9 % IV SOLN
40.0000 mg/m2 | Freq: Once | INTRAVENOUS | Status: AC
  Administered 2021-08-17: 73 mg via INTRAVENOUS
  Filled 2021-08-17: qty 73

## 2021-08-17 MED ORDER — SODIUM CHLORIDE 0.9% FLUSH
10.0000 mL | INTRAVENOUS | Status: DC | PRN
  Administered 2021-08-17: 10 mL

## 2021-08-17 MED ORDER — PALONOSETRON HCL INJECTION 0.25 MG/5ML
0.2500 mg | Freq: Once | INTRAVENOUS | Status: AC
  Administered 2021-08-17: 0.25 mg via INTRAVENOUS
  Filled 2021-08-17: qty 5

## 2021-08-17 MED ORDER — MAGNESIUM SULFATE 2 GM/50ML IV SOLN
2.0000 g | Freq: Once | INTRAVENOUS | Status: AC
  Administered 2021-08-17: 2 g via INTRAVENOUS
  Filled 2021-08-17: qty 50

## 2021-08-17 MED ORDER — SODIUM CHLORIDE 0.9 % IV SOLN: Freq: Once | INTRAVENOUS | Status: AC

## 2021-08-17 MED ORDER — OXYCODONE HCL 5 MG PO TABS
5.0000 mg | ORAL_TABLET | Freq: Four times a day (QID) | ORAL | 0 refills | Status: DC | PRN
Start: 2021-08-17 — End: 2021-10-21

## 2021-08-17 MED ORDER — POTASSIUM CHLORIDE IN NACL 20-0.9 MEQ/L-% IV SOLN
Freq: Once | INTRAVENOUS | Status: AC
  Filled 2021-08-17: qty 1000

## 2021-08-17 MED ORDER — SODIUM CHLORIDE 0.9 % IV SOLN
10.0000 mg | Freq: Once | INTRAVENOUS | Status: AC
  Administered 2021-08-17: 10 mg via INTRAVENOUS
  Filled 2021-08-17: qty 10

## 2021-08-17 MED ORDER — HEPARIN SOD (PORK) LOCK FLUSH 100 UNIT/ML IV SOLN
500.0000 [IU] | Freq: Once | INTRAVENOUS | Status: AC | PRN
  Administered 2021-08-17: 500 [IU]

## 2021-08-17 NOTE — Progress Notes (Signed)
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 Findings showed a 7.5 x 6.9 cm heterogeneously enhancing mass is noted posteriorly in the pelvis anterior to the sacrum which is highly concerning for colorectal malignancy with possible adjacent metastatic adenopathy. 05/24/2021: colonoscopy performed by [facility]. Biopsy showed poorly differentiated high grade carcinoma, differential including tuboovarian and peritoneal. Immunophemotype not consistent with colorectal adenocarcinoma.  06/04/2021: PET CT scan performed, findings show hypermetabolic mass adjacent to the rectum, likely due to colorectal primary neoplasm and ill-defined hypermetabolic nodule is seen more inferiorly in the left perirectal fat, likely a lymph node metastasis. She was also noted to have Numerous small bilateral small solid pulmonary nodules which are below resolution for PET-CT 06/11/2021: establish care with Dr. Lorenso Courier  06/22/2021: Laparoscopic diagnostic procedure with diverting loop colostomy and anorectal exam under anesthesia.  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 07/19/2021: Start of Radiation therapy.  07/20/2021: Week 1 of Cisplatin 40mg /m2 07/27/2021: Week 2 of Cisplatin 40mg /m2 08/02/2021: Week 3 of  Cisplatin 40mg /m2 08/09/2021: Week 4 of Cisplatin 40mg /m2 08/17/2021: Week 5 of Cisplatin 40mg /m2  Interval History:  Mariah Schultz 65 y.o. female with medical history significant for squamous cell cancer of unclear etiology who presents for a follow up visit. The patient's last visit was on 08/02/2021. In the interim since the last visit she continues receiving chemoradiation.  On exam today Mariah Schultz reports stable fatigue but continues to complete her daily activities on her own. She tries to take her dog for a walk 2-3 times per day. Her appetite has improved but has early satiety. She tries to eat small, frequent meals. She denies nausea, vomiting or abdominal pain.She has good output from her ostomy but notices mucus in her stools recently. She denies blood in her stool. She reports her rectal pain has improved and rates it as 4 out of 10 on a pain scale. She adds that she doesn't take her oxycodone as often as she used to for pain. She denies fevers, chills, night sweats, shortness of breath, chest pain, cough, peripheral edema or neuropathy. She has no other complaints.  A full 10 point ROS is listed below.   MEDICAL HISTORY:  Past Medical History:  Diagnosis Date   CAP (community acquired pneumonia) 09/10/2013   Hypertension    Papilloma of left breast    Papilloma of left breast 02/26/2019   Perforated appendicitis 03/19/2013    SURGICAL HISTORY: Past Surgical History:  Procedure Laterality Date   ABDOMINAL HYSTERECTOMY     BREAST EXCISIONAL BIOPSY Left 02/26/2019   B9 Biopsy   BREAST LUMPECTOMY WITH RADIOACTIVE SEED LOCALIZATION Left 02/26/2019   Procedure: LEFT BREAST LUMPECTOMY WITH RADIOACTIVE SEED LOCALIZATION;  Surgeon: Fanny Skates, MD;  Location: [facility];  Service: General;  Laterality: Left;   IR IMAGING GUIDED PORT INSERTION  07/21/2021   LAPAROSCOPIC APPENDECTOMY N/A 02/25/2013   Procedure: APPENDECTOMY LAPAROSCOPIC;  Surgeon: Harl Bowie, MD;  Location: [facility];  Service: General;  Laterality: N/A;   LAPAROSCOPY N/A 06/22/2021   Procedure: LAPAROSCOPY DIAGNOSTIC, DIVERTING LOOP COLOSTOMY, ANORECTAL EXAM UNDER ANESTHESIA, TRANSRECTAL CORE BIOPSIES.;  Surgeon: Lafonda Mosses, MD;  Location: [facility];  Service: Gynecology;  Laterality: N/A;    SOCIAL HISTORY: Social History   Socioeconomic History   Marital status: Married    Spouse name: Not on file   Number of children: Not on file   Years of education: Not on file   Highest education level: Not on file  Occupational History   Not on file  Tobacco Use   Smoking status: Every Day    Packs/day: 1.00    Years: 40.00    Pack years: 40.00    Types: Cigarettes   Smokeless tobacco: Never  Vaping Use   Vaping Use: Never used  Substance and Sexual Activity   Alcohol use: Yes    Comment: occ   Drug use: No   Sexual activity: Not Currently    Birth control/protection: Surgical  Other Topics Concern   Not on file  Social History Narrative   Not on file   Social Determinants of Health   Financial Resource Strain: Low Risk    Difficulty of Paying Living Expenses: Not very hard  Food Insecurity: No Food Insecurity   Worried About Running Out of Food in the Last Year: Never true   Ran Out of Food in the Last Year: Never true  Transportation Needs: No Transportation Needs   Lack of Transportation (Medical): No   Lack of Transportation (Non-Medical): No  Physical Activity: Not on file  Stress: Not on file  Social Connections: Not on file  Intimate Partner Violence: Not on file    FAMILY HISTORY: Family History  Problem Relation Age of Onset   Heart disease Father    Colon cancer Neg Hx    Breast cancer Neg Hx    Ovarian cancer Neg Hx    Pancreatic cancer Neg Hx    Prostate cancer Neg Hx    Endometrial cancer Neg Hx        Patient unaware    ALLERGIES:  is allergic to other and pneumococcal vac polyvalent.  MEDICATIONS:  Current Outpatient  Medications  Medication Sig Dispense Refill   acetaminophen (TYLENOL) 500 MG tablet Take 500 mg by mouth every 6 (six) hours as needed.     aspirin 81 MG tablet Take 81 mg by mouth daily.     HYDROmorphone (DILAUDID) 2 MG tablet Take 1 tablet (2 mg total) by mouth every 6 (six) hours as needed for severe pain. 30 tablet 0   lidocaine-prilocaine (EMLA) cream Apply 1 application topically as needed. 30 g 0   Multiple Vitamin (MULTIVITAMIN) tablet Take 1 tablet by mouth daily.     Omega-3 Fatty Acids (FISH OIL PO) Take 1 capsule by mouth daily.     ondansetron (ZOFRAN) 8 MG tablet Take 1 tablet (8 mg total) by mouth every 8 (eight) hours as needed. 30 tablet 0   oxyCODONE (OXY IR/ROXICODONE) 5 MG immediate release tablet Take 1-2 tablets (5-10 mg total) by mouth every 6 (six) hours as needed for moderate pain, severe pain or breakthrough pain. 60 tablet 0   prochlorperazine (COMPAZINE) 10 MG tablet Take 1 tablet (10 mg total) by mouth every 6 (six) hours as needed for nausea or vomiting. 30 tablet 0   valsartan-hydrochlorothiazide (DIOVAN-HCT) 160-12.5 MG tablet Take 1 tablet by mouth daily.     vitamin C (  ASCORBIC ACID) 500 MG tablet Take 500 mg by mouth daily.     albuterol (PROVENTIL HFA;VENTOLIN HFA) 108 (90 BASE) MCG/ACT inhaler Inhale 2 puffs into the lungs every 6 (six) hours as needed for wheezing or shortness of breath.     benzonatate (TESSALON) 100 MG capsule TAKE 1 CAPSULE 3 TIMES A DAY AS NEEDED FOR COUGH (Patient not taking: Reported on 08/17/2021)     neomycin (MYCIFRADIN) 500 MG tablet Take 1,000 mg by mouth 3 (three) times daily. (Patient not taking: Reported on 08/17/2021)     traMADol (ULTRAM) 50 MG tablet  (Patient not taking: Reported on 08/17/2021)     No current facility-administered medications for this visit.   Facility-Administered Medications Ordered in Other Visits  Medication Dose Route Frequency Provider Last Rate Last Admin   CISplatin (PLATINOL) 73 mg in sodium  chloride 0.9 % 250 mL chemo infusion  40 mg/m2 (Treatment Plan Recorded) Intravenous Once Orson Slick, MD       dexamethasone (DECADRON) 10 mg in sodium chloride 0.9 % 50 mL IVPB  10 mg Intravenous Once Orson Slick, MD       fosaprepitant (EMEND) 150 mg in sodium chloride 0.9 % 145 mL IVPB  150 mg Intravenous Once Ledell Peoples IV, MD       heparin lock flush 100 unit/mL  500 Units Intracatheter Once PRN Orson Slick, MD       palonosetron (ALOXI) injection 0.25 mg  0.25 mg Intravenous Once Ledell Peoples IV, MD       sodium chloride flush (NS) 0.9 % injection 10 mL  10 mL Intracatheter PRN Orson Slick, MD        REVIEW OF SYSTEMS:   Constitutional: ( - ) fevers, ( - )  chills , ( - ) night sweats Eyes: ( - ) blurriness of vision, ( - ) double vision, ( - ) watery eyes Ears, nose, mouth, throat, and face: ( - ) mucositis, ( - ) sore throat Respiratory: ( - ) cough, ( - ) dyspnea, ( - ) wheezes Cardiovascular: ( - ) palpitation, ( - ) chest discomfort, ( - ) lower extremity swelling Gastrointestinal:  ( - ) nausea, ( - ) heartburn, ( - ) change in bowel habits Skin: ( - ) abnormal skin rashes Lymphatics: ( - ) new lymphadenopathy, ( - ) easy bruising Neurological: ( - ) numbness, ( - ) tingling, ( - ) new weaknesses Behavioral/Psych: ( - ) mood change, ( - ) new changes  All other systems were reviewed with the patient and are negative.  PHYSICAL EXAMINATION: ECOG PERFORMANCE STATUS: 1 - Symptomatic but completely ambulatory  Vitals:   08/17/21 0938  BP: 119/80  Pulse: 81  Resp: 17  Temp: (!) 97.4 F (36.3 C)  SpO2: 100%     Filed Weights   08/17/21 0938  Weight: 158 lb 2 oz (71.7 kg)    GENERAL: Well-appearing middle-aged African-American female, alert, no distress and comfortable SKIN: skin color, texture, turgor are normal, no rashes or significant lesions EYES: conjunctiva are pink and non-injected, sclera clear LUNGS: clear to auscultation and  percussion with normal breathing effort HEART: regular rate & rhythm and no murmurs and no lower extremity edema ABDOMEN: Ostomy in place producing soft stool.  Soft, non-tender, non-distended, normal bowel sounds Musculoskeletal: no cyanosis of digits and no clubbing  PSYCH: alert & oriented x 3, fluent speech NEURO: no focal motor/sensory deficits  LABORATORY DATA:  I have reviewed the data as listed CBC Latest Ref Rng & Units 08/17/2021 08/09/2021 08/02/2021  WBC 4.0 - 10.5 K/uL 3.8(L) 7.7 15.3(H)  Hemoglobin 12.0 - 15.0 g/dL 9.8(L) 9.2(L) 10.4(L)  Hematocrit 36.0 - 46.0 % 29.0(L) 26.6(L) 30.2(L)  Platelets 150 - 400 K/uL 276 360 360    CMP Latest Ref Rng & Units 08/17/2021 08/09/2021 08/02/2021  Glucose 70 - 99 mg/dL 122(H) 161(H) 142(H)  BUN 8 - 23 mg/dL 28(H) 23 20  Creatinine 0.44 - 1.00 mg/dL 0.83 0.81 0.86  Sodium 135 - 145 mmol/L 138 136 134(L)  Potassium 3.5 - 5.1 mmol/L 3.5 3.1(L) 3.2(L)  Chloride 98 - 111 mmol/L 100 96(L) 94(L)  CO2 22 - 32 mmol/L 26 29 26   Calcium 8.9 - 10.3 mg/dL 9.2 8.9 9.4  Total Protein 6.5 - 8.1 g/dL 7.2 6.9 7.5  Total Bilirubin 0.3 - 1.2 mg/dL 0.2(L) 0.3 0.4  Alkaline Phos 38 - 126 U/L 179(H) 205(H) 289(H)  AST 15 - 41 U/L 17 19 29   ALT 0 - 44 U/L 20 37 100(H)    RADIOGRAPHIC STUDIES: IR IMAGING GUIDED PORT INSERTION  Result Date: 07/21/2021 CLINICAL DATA:  Rectal cancer, access for chemotherapy EXAM: RIGHT INTERNAL JUGULAR SINGLE LUMEN POWER PORT CATHETER INSERTION Date:  07/21/2021 07/21/2021 5:20 pm Radiologist:  Jerilynn Mages. Daryll Brod, MD Guidance:  Ultrasound and fluoroscopic MEDICATIONS: 1% lidocaine local ANESTHESIA/SEDATION: Versed 2.0 mg IV; Fentanyl 100 mcg IV; Moderate Sedation Time:  29 minutes The patient was continuously monitored during the procedure by the interventional radiology nurse under my direct supervision. FLUOROSCOPY TIME:  0 minutes, 42 seconds (2.5 mGy) COMPLICATIONS: None immediate. CONTRAST:  None. PROCEDURE: Informed  consent was obtained from the patient following explanation of the procedure, risks, benefits and alternatives. The patient understands, agrees and consents for the procedure. All questions were addressed. A time out was performed. Maximal barrier sterile technique utilized including caps, mask, sterile gowns, sterile gloves, large sterile drape, hand hygiene, and 2% chlorhexidine scrub. Under sterile conditions and local anesthesia, right internal jugular micropuncture venous access was performed. Access was performed with ultrasound. Images were obtained for documentation of the patent right internal jugular vein. A guide wire was inserted followed by a transitional dilator. This allowed insertion of a guide wire and catheter into the IVC. Measurements were obtained from the SVC / RA junction back to the right IJ venotomy site. In the right infraclavicular chest, a subcutaneous pocket was created over the second anterior rib. This was done under sterile conditions and local anesthesia. 1% lidocaine with epinephrine was utilized for this. A 2.5 cm incision was made in the skin. Blunt dissection was performed to create a subcutaneous pocket over the right pectoralis major muscle. The pocket was flushed with saline vigorously. There was adequate hemostasis. The port catheter was assembled and checked for leakage. The port catheter was secured in the pocket with two retention sutures. The tubing was tunneled subcutaneously to the right venotomy site and inserted into the SVC/RA junction through a valved peel-away sheath. Position was confirmed with fluoroscopy. Images were obtained for documentation. The patient tolerated the procedure well. No immediate complications. Incisions were closed in a two layer fashion with 4 - 0 Vicryl suture. Dermabond was applied to the skin. The port catheter was accessed, blood was aspirated followed by saline and heparin flushes. Needle was removed. A dry sterile dressing was applied.  IMPRESSION: Ultrasound and fluoroscopically guided right internal jugular single lumen power port catheter insertion. Tip  in the SVC/RA junction. Catheter ready for use. Electronically Signed   By: Jerilynn Mages.  Shick M.D.   On: 07/21/2021 17:25    ASSESSMENT & PLAN Mariah Schultz 65 y.o. female with medical history significant for squamous cell cancer of unclear etiology who presents for a follow up visit.   Today we discussed the findings on pathology and the steps moving forward.  At this time this appears to be a squamous cell carcinoma of unclear etiology.  Based on the radiographic review and the surgical procedure there is no clear etiology for this patient's cancer.  The patient underwent a loop colostomy with biopsy of the lesion.  At this time I recommend that we pursue chemoradiation with cisplatin 40 mg per metered squared and combination with radiation therapy.  We will continue cisplatin for the duration of radiation.  The patient voiced understanding of this plan moving forward.  We started chemotherapy on 07/20/2021 with concurrent radiation.  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 --Labs today were reviewed and adequate for treatment. Patient will proceed with Cycle 5, Day 1 of cisplatin.  --RTC in 2 weeks with interval weekly cisplatin treatments  #Pain Control --Xtampza 13.5mg  q12H  for long act pain control --Oxycodone 5-10mg  q6H as needed. Sent refill today.   --dilaudid 2 mg q6H PRN for breakthrough  #Supportive Care -- chemotherapy education complete -- port placed -- zofran 8mg  q8H PRN and compazine 10mg  PO q6H for nausea -- EMLA cream for port -- pain medication as above  No orders of the defined types were placed in this encounter.  All questions were answered. The patient knows to call the clinic with any problems, questions or concerns.  I have spent a total of 30 minutes minutes of face-to-face and non-face-to-face time, preparing to see the patient, performing a medically appropriate examination, counseling and educating the patient, ordering medications documenting clinical information in the electronic health record, and care coordination.   Lincoln Brigham, PA-C Department of Hematology/Oncology [facility] [facility], [facility], [facility] Phone: [phone]   08/17/2021 11:39 AM

## 2021-08-17 NOTE — Patient Instructions (Signed)
[facility] MEDICAL [facility]COLOG[facility]  Discharge Instructions: Thank you for choosing [facility] to provide your oncology and hematology care.   If you have a lab appointment with the Cancer Center, please go directly to the Cancer Center and check in at the registration area.   Wear comfortable clothing and clothing appropriate for easy access to any Portacath or PICC line.   We strive to give you quality time with your provider. You may need to reschedule your appointment if you arrive late (15 or more minutes).  Arriving late affects you and other patients whose appointments are after yours.  Also, if you miss three or more appointments without notifying the office, you may be dismissed from the clinic at the provider's discretion.      For prescription refill requests, have your pharmacy contact our office and allow 72 hours for refills to be completed.    Today you received the following chemotherapy and/or immunotherapy agents : Cisplatin    To help prevent nausea and vomiting after your treatment, we encourage you to take your nausea medication as directed.  BELOW ARE SYMPTOMS THAT SHOULD BE REPORTED IMMEDIATELY: *FEVER GREATER THAN 100.4 F (38 C) OR HIGHER *CHILLS OR SWEATING *NAUSEA AND VOMITING THAT IS NOT CONTROLLED WITH YOUR NAUSEA MEDICATION *UNUSUAL SHORTNESS OF BREATH *UNUSUAL BRUISING OR BLEEDING *URINARY PROBLEMS (pain or burning when urinating, or frequent urination) *BOWEL PROBLEMS (unusual diarrhea, constipation, pain near the anus) TENDERNESS IN MOUTH AND THROAT WITH OR WITHOUT PRESENCE OF ULCERS (sore throat, sores in mouth, or a toothache) UNUSUAL RASH, SWELLING OR PAIN  UNUSUAL VAGINAL DISCHARGE OR ITCHING   Items with * indicate a potential emergency and should be followed up as soon as possible or go to the Emergency Department if any problems should occur.  Please show the CHEMOTHERAPY ALERT CARD or IMMUNOTHERAPY ALERT CARD at check-in to  the Emergency Department and triage nurse.  Should you have questions after your visit or need to cancel or reschedule your appointment, please contact [facility]ICAL [facility]COL[facility]  Dept: [phone]  and follow the prompts.  Office hours are 8:00 a.m. to 4:30 p.m. Monday - Friday. Please note that voicemails left after 4:00 p.m. may not be returned until the following business day.  We are closed weekends and major holidays. You have access to a nurse at all times for urgent questions. Please call the main number to the clinic Dept: [phone] and follow the prompts.   For any non-urgent questions, you may also contact your provider using MyChart. We now offer e-Visits for anyone 18 and older to request care online for non-urgent symptoms. For details visit [email or web address].   Also download the MyChart app! Go to the app store, search "MyChart", open the app, select , and log in with your MyChart username and password.  Due to Covid, a mask is required upon entering the hospital/clinic. If you do not have a mask, one will be given to you upon arrival. For doctor visits, patients may have 1 support person aged 18 or older with them. For treatment visits, patients cannot have anyone with them due to current Covid guidelines and our immunocompromised population.   

## 2021-08-18 ENCOUNTER — Other Ambulatory Visit: Payer: Self-pay

## 2021-08-18 ENCOUNTER — Ambulatory Visit
Admission: RE | Admit: 2021-08-18 | Discharge: 2021-08-18 | Disposition: A | Discharge status: Home / Self Care | Payer: Medicare HMO | Source: Ambulatory Visit | Attending: Radiation Oncology | Admitting: Radiation Oncology

## 2021-08-18 DIAGNOSIS — Z79899 Other long term (current) drug therapy: Secondary | ICD-10-CM | POA: Diagnosis not present

## 2021-08-18 DIAGNOSIS — F17210 Nicotine dependence, cigarettes, uncomplicated: Secondary | ICD-10-CM | POA: Diagnosis not present

## 2021-08-18 DIAGNOSIS — R140 Abdominal distension (gaseous): Secondary | ICD-10-CM | POA: Diagnosis not present

## 2021-08-18 DIAGNOSIS — C19 Malignant neoplasm of rectosigmoid junction: Secondary | ICD-10-CM | POA: Diagnosis not present

## 2021-08-18 DIAGNOSIS — R918 Other nonspecific abnormal finding of lung field: Secondary | ICD-10-CM | POA: Diagnosis not present

## 2021-08-18 DIAGNOSIS — Z5111 Encounter for antineoplastic chemotherapy: Secondary | ICD-10-CM | POA: Diagnosis not present

## 2021-08-18 DIAGNOSIS — C763 Malignant neoplasm of pelvis: Secondary | ICD-10-CM | POA: Diagnosis not present

## 2021-08-18 DIAGNOSIS — R6881 Early satiety: Secondary | ICD-10-CM | POA: Diagnosis not present

## 2021-08-18 DIAGNOSIS — R5383 Other fatigue: Secondary | ICD-10-CM | POA: Diagnosis not present

## 2021-08-19 ENCOUNTER — Ambulatory Visit
Admission: RE | Admit: 2021-08-19 | Discharge: 2021-08-19 | Disposition: A | Discharge status: Home / Self Care | Payer: Medicare HMO | Source: Ambulatory Visit | Attending: Radiation Oncology | Admitting: Radiation Oncology

## 2021-08-19 DIAGNOSIS — C19 Malignant neoplasm of rectosigmoid junction: Secondary | ICD-10-CM | POA: Diagnosis not present

## 2021-08-19 DIAGNOSIS — Z5111 Encounter for antineoplastic chemotherapy: Secondary | ICD-10-CM | POA: Diagnosis not present

## 2021-08-19 DIAGNOSIS — R6881 Early satiety: Secondary | ICD-10-CM | POA: Diagnosis not present

## 2021-08-19 DIAGNOSIS — F17210 Nicotine dependence, cigarettes, uncomplicated: Secondary | ICD-10-CM | POA: Diagnosis not present

## 2021-08-19 DIAGNOSIS — Z79899 Other long term (current) drug therapy: Secondary | ICD-10-CM | POA: Diagnosis not present

## 2021-08-19 DIAGNOSIS — R5383 Other fatigue: Secondary | ICD-10-CM | POA: Diagnosis not present

## 2021-08-19 DIAGNOSIS — C763 Malignant neoplasm of pelvis: Secondary | ICD-10-CM | POA: Diagnosis not present

## 2021-08-19 DIAGNOSIS — R140 Abdominal distension (gaseous): Secondary | ICD-10-CM | POA: Diagnosis not present

## 2021-08-19 DIAGNOSIS — R918 Other nonspecific abnormal finding of lung field: Secondary | ICD-10-CM | POA: Diagnosis not present

## 2021-08-20 ENCOUNTER — Other Ambulatory Visit: Payer: Self-pay

## 2021-08-20 ENCOUNTER — Ambulatory Visit
Admission: RE | Admit: 2021-08-20 | Discharge: 2021-08-20 | Disposition: A | Discharge status: Home / Self Care | Payer: Medicare HMO | Source: Ambulatory Visit | Attending: Radiation Oncology | Admitting: Radiation Oncology

## 2021-08-20 DIAGNOSIS — R6881 Early satiety: Secondary | ICD-10-CM | POA: Diagnosis not present

## 2021-08-20 DIAGNOSIS — F17210 Nicotine dependence, cigarettes, uncomplicated: Secondary | ICD-10-CM | POA: Diagnosis not present

## 2021-08-20 DIAGNOSIS — Z79899 Other long term (current) drug therapy: Secondary | ICD-10-CM | POA: Diagnosis not present

## 2021-08-20 DIAGNOSIS — R918 Other nonspecific abnormal finding of lung field: Secondary | ICD-10-CM | POA: Diagnosis not present

## 2021-08-20 DIAGNOSIS — R140 Abdominal distension (gaseous): Secondary | ICD-10-CM | POA: Diagnosis not present

## 2021-08-20 DIAGNOSIS — Z5111 Encounter for antineoplastic chemotherapy: Secondary | ICD-10-CM | POA: Diagnosis not present

## 2021-08-20 DIAGNOSIS — C763 Malignant neoplasm of pelvis: Secondary | ICD-10-CM | POA: Diagnosis not present

## 2021-08-20 DIAGNOSIS — R5383 Other fatigue: Secondary | ICD-10-CM | POA: Diagnosis not present

## 2021-08-20 DIAGNOSIS — C19 Malignant neoplasm of rectosigmoid junction: Secondary | ICD-10-CM | POA: Diagnosis not present

## 2021-08-23 ENCOUNTER — Other Ambulatory Visit: Payer: Medicare HMO

## 2021-08-23 ENCOUNTER — Inpatient Hospital Stay: Payer: Medicare HMO

## 2021-08-23 ENCOUNTER — Other Ambulatory Visit: Payer: Self-pay | Admitting: Hematology and Oncology

## 2021-08-23 ENCOUNTER — Ambulatory Visit
Admission: RE | Admit: 2021-08-23 | Discharge: 2021-08-23 | Disposition: A | Discharge status: Home / Self Care | Payer: Medicare HMO | Source: Ambulatory Visit | Attending: Radiation Oncology | Admitting: Radiation Oncology

## 2021-08-23 ENCOUNTER — Other Ambulatory Visit: Payer: Self-pay

## 2021-08-23 VITALS — BP 111/79 | HR 86 | Temp 98.6°F | Resp 16 | Wt 156.8 lb

## 2021-08-23 DIAGNOSIS — C763 Malignant neoplasm of pelvis: Secondary | ICD-10-CM

## 2021-08-23 DIAGNOSIS — R5383 Other fatigue: Secondary | ICD-10-CM | POA: Diagnosis not present

## 2021-08-23 DIAGNOSIS — R918 Other nonspecific abnormal finding of lung field: Secondary | ICD-10-CM | POA: Diagnosis not present

## 2021-08-23 DIAGNOSIS — Z5111 Encounter for antineoplastic chemotherapy: Secondary | ICD-10-CM | POA: Diagnosis not present

## 2021-08-23 DIAGNOSIS — Z79899 Other long term (current) drug therapy: Secondary | ICD-10-CM | POA: Diagnosis not present

## 2021-08-23 DIAGNOSIS — F17210 Nicotine dependence, cigarettes, uncomplicated: Secondary | ICD-10-CM | POA: Diagnosis not present

## 2021-08-23 DIAGNOSIS — C19 Malignant neoplasm of rectosigmoid junction: Secondary | ICD-10-CM | POA: Diagnosis not present

## 2021-08-23 DIAGNOSIS — R140 Abdominal distension (gaseous): Secondary | ICD-10-CM | POA: Diagnosis not present

## 2021-08-23 DIAGNOSIS — R6881 Early satiety: Secondary | ICD-10-CM | POA: Diagnosis not present

## 2021-08-23 LAB — CMP (CANCER CENTER ONLY)
ALT: 19 U/L (ref 0–44)
AST: 17 U/L (ref 15–41)
Albumin: 3 g/dL — ABNORMAL LOW (ref 3.5–5.0)
Alkaline Phosphatase: 182 U/L — ABNORMAL HIGH (ref 38–126)
Anion gap: 10 (ref 5–15)
BUN: 32 mg/dL — ABNORMAL HIGH (ref 8–23)
CO2: 28 mmol/L (ref 22–32)
Calcium: 9.2 mg/dL (ref 8.9–10.3)
Chloride: 98 mmol/L (ref 98–111)
Creatinine: 0.95 mg/dL (ref 0.44–1.00)
GFR, Estimated: >60 mL/min (ref >60)
Glucose, Bld: 106 mg/dL — ABNORMAL HIGH (ref 70–99)
Potassium: 4.3 mmol/L (ref 3.5–5.1)
Sodium: 136 mmol/L (ref 135–145)
Total Bilirubin: 0.3 mg/dL (ref 0.3–1.2)
Total Protein: 7.5 g/dL (ref 6.5–8.1)

## 2021-08-23 LAB — CBC WITH DIFFERENTIAL (CANCER CENTER ONLY)
Abs Immature Granulocytes: 0.01 10*3/uL (ref 0.00–0.07)
Basophils Absolute: 0 10*3/uL (ref 0.0–0.1)
Basophils Relative: 0 %
Eosinophils Absolute: 0 10*3/uL (ref 0.0–0.5)
Eosinophils Relative: 1 %
HCT: 30.3 % — ABNORMAL LOW (ref 36.0–46.0)
Hemoglobin: 10.2 g/dL — ABNORMAL LOW (ref 12.0–15.0)
Immature Granulocytes: 0 %
Lymphocytes Relative: 8 %
Lymphs Abs: 0.2 10*3/uL — ABNORMAL LOW (ref 0.7–4.0)
MCH: 30.5 pg (ref 26.0–34.0)
MCHC: 33.7 g/dL (ref 30.0–36.0)
MCV: 90.7 fL (ref 80.0–100.0)
Monocytes Absolute: 0.3 10*3/uL (ref 0.1–1.0)
Monocytes Relative: 9 %
Neutro Abs: 2.6 10*3/uL (ref 1.7–7.7)
Neutrophils Relative %: 82 %
Platelet Count: 195 10*3/uL (ref 150–400)
RBC: 3.34 MIL/uL — ABNORMAL LOW (ref 3.87–5.11)
RDW: 14.3 % (ref 11.5–15.5)
WBC Count: 3.2 10*3/uL — ABNORMAL LOW (ref 4.0–10.5)
nRBC: 0 % (ref 0.0–0.2)

## 2021-08-23 LAB — MAGNESIUM: Magnesium: 1.7 mg/dL (ref 1.7–2.4)

## 2021-08-23 MED ORDER — SODIUM CHLORIDE 0.9 % IV SOLN
40.0000 mg/m2 | Freq: Once | INTRAVENOUS | Status: AC
  Administered 2021-08-23: 14:00:00 73 mg via INTRAVENOUS
  Filled 2021-08-23: qty 73

## 2021-08-23 MED ORDER — SODIUM CHLORIDE 0.9 % IV SOLN: Freq: Once | INTRAVENOUS | Status: AC

## 2021-08-23 MED ORDER — POTASSIUM CHLORIDE IN NACL 20-0.9 MEQ/L-% IV SOLN
Freq: Once | INTRAVENOUS | Status: AC
  Filled 2021-08-23: qty 1000

## 2021-08-23 MED ORDER — SODIUM CHLORIDE 0.9 % IV SOLN
10.0000 mg | Freq: Once | INTRAVENOUS | Status: AC
  Administered 2021-08-23: 12:00:00 10 mg via INTRAVENOUS
  Filled 2021-08-23: qty 10

## 2021-08-23 MED ORDER — MAGNESIUM SULFATE 2 GM/50ML IV SOLN
2.0000 g | Freq: Once | INTRAVENOUS | Status: AC
  Administered 2021-08-23: 11:00:00 2 g via INTRAVENOUS
  Filled 2021-08-23: qty 50

## 2021-08-23 MED ORDER — SODIUM CHLORIDE 0.9% FLUSH
10.0000 mL | INTRAVENOUS | Status: DC | PRN
  Administered 2021-08-23: 17:00:00 10 mL

## 2021-08-23 MED ORDER — PALONOSETRON HCL INJECTION 0.25 MG/5ML
0.2500 mg | Freq: Once | INTRAVENOUS | Status: AC
  Administered 2021-08-23: 12:00:00 0.25 mg via INTRAVENOUS
  Filled 2021-08-23: qty 5

## 2021-08-23 MED ORDER — SODIUM CHLORIDE 0.9 % IV SOLN
150.0000 mg | Freq: Once | INTRAVENOUS | Status: AC
  Administered 2021-08-23: 12:00:00 150 mg via INTRAVENOUS
  Filled 2021-08-23: qty 150

## 2021-08-23 MED ORDER — HEPARIN SOD (PORK) LOCK FLUSH 100 UNIT/ML IV SOLN
500.0000 [IU] | Freq: Once | INTRAVENOUS | Status: AC | PRN
  Administered 2021-08-23: 17:00:00 500 [IU]

## 2021-08-23 NOTE — Progress Notes (Signed)
Nutrition Follow-up:  Patient with SCC of unclear etiology, questionable colorectal malignancy, tuboovarian and peritoneal.  S/p diverting loop colostomy 10/18.  Patient receiving radiation and chemotherapy.    Met with patient during infusion.  Patient reports that appetite is the same.  Patient continues to enjoy blueberry yogurt, frosted flakes with banana, trail mix.  Tries to eat small frequent meals during the day.  Drinking ensure, water.      Medications: reviewed  Labs: reviewed  Anthropometrics:   Weight 156 lb 12 oz  162 lb 12.8 oz on 11/28 165 lb 8 oz on 11/21 170 lb on 9/30    NUTRITION DIAGNOSIS: Inadequate oral intake continues with weight loss   INTERVENTION:  Continue oral nutrition supplement as often as able Encouraged high calorie, high protein foods    MONITORING, EVALUATION, GOAL: weight trends, intake   NEXT VISIT: as needed  Mariah Schultz B. Zenia Resides, [place], [place] Registered Dietitian [phone] (mobile)

## 2021-08-23 NOTE — Progress Notes (Signed)
Per Dr. Lorenso Courier, ok to proceed with pre hydration fluid while labs process.

## 2021-08-23 NOTE — Patient Instructions (Signed)
[facility]  Discharge Instructions: Thank you for choosing [facility] to provide your oncology and hematology care.   If you have a lab appointment with the Cancer Center, please go directly to the Cancer Center and check in at the registration area.   Wear comfortable clothing and clothing appropriate for easy access to any Portacath or PICC line.   We strive to give you quality time with your provider. You may need to reschedule your appointment if you arrive late (15 or more minutes).  Arriving late affects you and other patients whose appointments are after yours.  Also, if you miss three or more appointments without notifying the office, you may be dismissed from the clinic at the provider's discretion.      For prescription refill requests, have your pharmacy contact our office and allow 72 hours for refills to be completed.    Today you received the following chemotherapy and/or immunotherapy agents : Cisplatin    To help prevent nausea and vomiting after your treatment, we encourage you to take your nausea medication as directed.  BELOW ARE SYMPTOMS THAT SHOULD BE REPORTED IMMEDIATELY: *FEVER GREATER THAN 100.4 F (38 C) OR HIGHER *CHILLS OR SWEATING *NAUSEA AND VOMITING THAT IS NOT CONTROLLED WITH YOUR NAUSEA MEDICATION *UNUSUAL SHORTNESS OF BREATH *UNUSUAL BRUISING OR BLEEDING *URINARY PROBLEMS (pain or burning when urinating, or frequent urination) *BOWEL PROBLEMS (unusual diarrhea, constipation, pain near the anus) TENDERNESS IN MOUTH AND THROAT WITH OR WITHOUT PRESENCE OF ULCERS (sore throat, sores in mouth, or a toothache) UNUSUAL RASH, SWELLING OR PAIN  UNUSUAL VAGINAL DISCHARGE OR ITCHING   Items with * indicate a potential emergency and should be followed up as soon as possible or go to the Emergency Department if any problems should occur.  Please show the CHEMOTHERAPY ALERT CARD or IMMUNOTHERAPY ALERT CARD at check-in to  the Emergency Department and triage nurse.  Should you have questions after your visit or need to cancel or reschedule your appointment, please contact [facility]  Dept: [phone]  and follow the prompts.  Office hours are 8:00 a.m. to 4:30 p.m. Monday - Friday. Please note that voicemails left after 4:00 p.m. may not be returned until the following business day.  We are closed weekends and major holidays. You have access to a nurse at all times for urgent questions. Please call the main number to the clinic Dept: [phone] and follow the prompts.   For any non-urgent questions, you may also contact your provider using MyChart. We now offer e-Visits for anyone 18 and older to request care online for non-urgent symptoms. For details visit [email or web address].   Also download the MyChart app! Go to the app store, search "MyChart", open the app, select [facility], and log in with your MyChart username and password.  Due to Covid, a mask is required upon entering the hospital/clinic. If you do not have a mask, one will be given to you upon arrival. For doctor visits, patients may have 1 support person aged 18 or older with them. For treatment visits, patients cannot have anyone with them due to current Covid guidelines and our immunocompromised population.   

## 2021-08-23 NOTE — Progress Notes (Signed)
Per Dr. Lorenso Courier, ok to proceed with pending magnesium lab.

## 2021-08-24 ENCOUNTER — Ambulatory Visit: Payer: Medicare HMO

## 2021-08-24 DIAGNOSIS — U071 COVID-19: Secondary | ICD-10-CM | POA: Diagnosis not present

## 2021-08-24 DIAGNOSIS — K56600 Partial intestinal obstruction, unspecified as to cause: Secondary | ICD-10-CM | POA: Diagnosis not present

## 2021-08-24 DIAGNOSIS — E876 Hypokalemia: Secondary | ICD-10-CM | POA: Diagnosis not present

## 2021-08-24 DIAGNOSIS — R5383 Other fatigue: Secondary | ICD-10-CM | POA: Diagnosis not present

## 2021-08-24 DIAGNOSIS — F17210 Nicotine dependence, cigarettes, uncomplicated: Secondary | ICD-10-CM | POA: Diagnosis not present

## 2021-08-24 DIAGNOSIS — Z483 Aftercare following surgery for neoplasm: Secondary | ICD-10-CM | POA: Diagnosis not present

## 2021-08-24 DIAGNOSIS — I10 Essential (primary) hypertension: Secondary | ICD-10-CM | POA: Diagnosis not present

## 2021-08-24 DIAGNOSIS — R918 Other nonspecific abnormal finding of lung field: Secondary | ICD-10-CM | POA: Diagnosis not present

## 2021-08-24 DIAGNOSIS — C763 Malignant neoplasm of pelvis: Secondary | ICD-10-CM | POA: Diagnosis not present

## 2021-08-24 DIAGNOSIS — Z79899 Other long term (current) drug therapy: Secondary | ICD-10-CM | POA: Diagnosis not present

## 2021-08-24 DIAGNOSIS — C19 Malignant neoplasm of rectosigmoid junction: Secondary | ICD-10-CM | POA: Diagnosis not present

## 2021-08-24 DIAGNOSIS — Z433 Encounter for attention to colostomy: Secondary | ICD-10-CM | POA: Diagnosis not present

## 2021-08-24 DIAGNOSIS — R6881 Early satiety: Secondary | ICD-10-CM | POA: Diagnosis not present

## 2021-08-24 DIAGNOSIS — R140 Abdominal distension (gaseous): Secondary | ICD-10-CM | POA: Diagnosis not present

## 2021-08-24 DIAGNOSIS — C20 Malignant neoplasm of rectum: Secondary | ICD-10-CM | POA: Diagnosis not present

## 2021-08-24 DIAGNOSIS — Z5111 Encounter for antineoplastic chemotherapy: Secondary | ICD-10-CM | POA: Diagnosis not present

## 2021-08-25 ENCOUNTER — Ambulatory Visit: Payer: Medicare HMO

## 2021-08-25 ENCOUNTER — Other Ambulatory Visit: Payer: Self-pay

## 2021-08-25 DIAGNOSIS — C19 Malignant neoplasm of rectosigmoid junction: Secondary | ICD-10-CM | POA: Diagnosis not present

## 2021-08-25 DIAGNOSIS — F17210 Nicotine dependence, cigarettes, uncomplicated: Secondary | ICD-10-CM | POA: Diagnosis not present

## 2021-08-25 DIAGNOSIS — Z5111 Encounter for antineoplastic chemotherapy: Secondary | ICD-10-CM | POA: Diagnosis not present

## 2021-08-25 DIAGNOSIS — R140 Abdominal distension (gaseous): Secondary | ICD-10-CM | POA: Diagnosis not present

## 2021-08-25 DIAGNOSIS — R5383 Other fatigue: Secondary | ICD-10-CM | POA: Diagnosis not present

## 2021-08-25 DIAGNOSIS — R6881 Early satiety: Secondary | ICD-10-CM | POA: Diagnosis not present

## 2021-08-25 DIAGNOSIS — C763 Malignant neoplasm of pelvis: Secondary | ICD-10-CM | POA: Diagnosis not present

## 2021-08-25 DIAGNOSIS — Z79899 Other long term (current) drug therapy: Secondary | ICD-10-CM | POA: Diagnosis not present

## 2021-08-25 DIAGNOSIS — R918 Other nonspecific abnormal finding of lung field: Secondary | ICD-10-CM | POA: Diagnosis not present

## 2021-08-26 ENCOUNTER — Ambulatory Visit
Admission: RE | Admit: 2021-08-26 | Discharge: 2021-08-26 | Disposition: A | Discharge status: Home / Self Care | Payer: Medicare HMO | Source: Ambulatory Visit | Attending: Radiation Oncology | Admitting: Radiation Oncology

## 2021-08-26 DIAGNOSIS — C763 Malignant neoplasm of pelvis: Secondary | ICD-10-CM | POA: Diagnosis not present

## 2021-08-26 DIAGNOSIS — C19 Malignant neoplasm of rectosigmoid junction: Secondary | ICD-10-CM | POA: Diagnosis not present

## 2021-08-26 DIAGNOSIS — Z5111 Encounter for antineoplastic chemotherapy: Secondary | ICD-10-CM | POA: Diagnosis not present

## 2021-08-26 DIAGNOSIS — R918 Other nonspecific abnormal finding of lung field: Secondary | ICD-10-CM | POA: Diagnosis not present

## 2021-08-26 DIAGNOSIS — R6881 Early satiety: Secondary | ICD-10-CM | POA: Diagnosis not present

## 2021-08-26 DIAGNOSIS — Z79899 Other long term (current) drug therapy: Secondary | ICD-10-CM | POA: Diagnosis not present

## 2021-08-26 DIAGNOSIS — R5383 Other fatigue: Secondary | ICD-10-CM | POA: Diagnosis not present

## 2021-08-26 DIAGNOSIS — R140 Abdominal distension (gaseous): Secondary | ICD-10-CM | POA: Diagnosis not present

## 2021-08-26 DIAGNOSIS — F17210 Nicotine dependence, cigarettes, uncomplicated: Secondary | ICD-10-CM | POA: Diagnosis not present

## 2021-08-27 ENCOUNTER — Other Ambulatory Visit: Payer: Self-pay

## 2021-08-27 ENCOUNTER — Ambulatory Visit
Admission: RE | Admit: 2021-08-27 | Discharge: 2021-08-27 | Disposition: A | Discharge status: Home / Self Care | Payer: Medicare HMO | Source: Ambulatory Visit | Attending: Radiation Oncology | Admitting: Radiation Oncology

## 2021-08-27 DIAGNOSIS — C763 Malignant neoplasm of pelvis: Secondary | ICD-10-CM | POA: Diagnosis not present

## 2021-08-27 DIAGNOSIS — R6881 Early satiety: Secondary | ICD-10-CM | POA: Diagnosis not present

## 2021-08-27 DIAGNOSIS — R140 Abdominal distension (gaseous): Secondary | ICD-10-CM | POA: Diagnosis not present

## 2021-08-27 DIAGNOSIS — F17210 Nicotine dependence, cigarettes, uncomplicated: Secondary | ICD-10-CM | POA: Diagnosis not present

## 2021-08-27 DIAGNOSIS — R918 Other nonspecific abnormal finding of lung field: Secondary | ICD-10-CM | POA: Diagnosis not present

## 2021-08-27 DIAGNOSIS — R5383 Other fatigue: Secondary | ICD-10-CM | POA: Diagnosis not present

## 2021-08-27 DIAGNOSIS — C19 Malignant neoplasm of rectosigmoid junction: Secondary | ICD-10-CM | POA: Diagnosis not present

## 2021-08-27 DIAGNOSIS — Z79899 Other long term (current) drug therapy: Secondary | ICD-10-CM | POA: Diagnosis not present

## 2021-08-27 DIAGNOSIS — Z5111 Encounter for antineoplastic chemotherapy: Secondary | ICD-10-CM | POA: Diagnosis not present

## 2021-08-31 ENCOUNTER — Ambulatory Visit: Payer: Medicare HMO

## 2021-08-31 ENCOUNTER — Other Ambulatory Visit: Payer: Self-pay

## 2021-08-31 ENCOUNTER — Inpatient Hospital Stay: Payer: Medicare HMO

## 2021-08-31 ENCOUNTER — Inpatient Hospital Stay: Payer: Medicare HMO | Admitting: Hematology and Oncology

## 2021-08-31 ENCOUNTER — Ambulatory Visit
Admission: RE | Admit: 2021-08-31 | Discharge: 2021-08-31 | Disposition: A | Discharge status: Home / Self Care | Payer: Medicare HMO | Source: Ambulatory Visit | Attending: Radiation Oncology | Admitting: Radiation Oncology

## 2021-08-31 VITALS — BP 130/81 | HR 84 | Temp 97.0°F | Resp 18 | Wt 156.8 lb

## 2021-08-31 DIAGNOSIS — R140 Abdominal distension (gaseous): Secondary | ICD-10-CM | POA: Diagnosis not present

## 2021-08-31 DIAGNOSIS — C763 Malignant neoplasm of pelvis: Secondary | ICD-10-CM

## 2021-08-31 DIAGNOSIS — Z95828 Presence of other vascular implants and grafts: Secondary | ICD-10-CM

## 2021-08-31 DIAGNOSIS — F17210 Nicotine dependence, cigarettes, uncomplicated: Secondary | ICD-10-CM | POA: Diagnosis not present

## 2021-08-31 DIAGNOSIS — K6289 Other specified diseases of anus and rectum: Secondary | ICD-10-CM | POA: Diagnosis not present

## 2021-08-31 DIAGNOSIS — C19 Malignant neoplasm of rectosigmoid junction: Secondary | ICD-10-CM | POA: Diagnosis not present

## 2021-08-31 DIAGNOSIS — R918 Other nonspecific abnormal finding of lung field: Secondary | ICD-10-CM | POA: Diagnosis not present

## 2021-08-31 DIAGNOSIS — Z79899 Other long term (current) drug therapy: Secondary | ICD-10-CM | POA: Diagnosis not present

## 2021-08-31 DIAGNOSIS — Z5111 Encounter for antineoplastic chemotherapy: Secondary | ICD-10-CM | POA: Diagnosis not present

## 2021-08-31 DIAGNOSIS — R5383 Other fatigue: Secondary | ICD-10-CM | POA: Diagnosis not present

## 2021-08-31 DIAGNOSIS — R6881 Early satiety: Secondary | ICD-10-CM | POA: Diagnosis not present

## 2021-08-31 LAB — CMP (CANCER CENTER ONLY)
ALT: 15 U/L (ref 0–44)
AST: 15 U/L (ref 15–41)
Albumin: 3.5 g/dL (ref 3.5–5.0)
Alkaline Phosphatase: 151 U/L — ABNORMAL HIGH (ref 38–126)
Anion gap: 8 (ref 5–15)
BUN: 34 mg/dL — ABNORMAL HIGH (ref 8–23)
CO2: 29 mmol/L (ref 22–32)
Calcium: 9.1 mg/dL (ref 8.9–10.3)
Chloride: 100 mmol/L (ref 98–111)
Creatinine: 1.01 mg/dL — ABNORMAL HIGH (ref 0.44–1.00)
GFR, Estimated: >60 mL/min (ref >60)
Glucose, Bld: 114 mg/dL — ABNORMAL HIGH (ref 70–99)
Potassium: 3.6 mmol/L (ref 3.5–5.1)
Sodium: 137 mmol/L (ref 135–145)
Total Bilirubin: 0.3 mg/dL (ref 0.3–1.2)
Total Protein: 7.1 g/dL (ref 6.5–8.1)

## 2021-08-31 LAB — CBC WITH DIFFERENTIAL (CANCER CENTER ONLY)
Abs Immature Granulocytes: 0.01 10*3/uL (ref 0.00–0.07)
Basophils Absolute: 0 10*3/uL (ref 0.0–0.1)
Basophils Relative: 0 %
Eosinophils Absolute: 0 10*3/uL (ref 0.0–0.5)
Eosinophils Relative: 1 %
HCT: 26.9 % — ABNORMAL LOW (ref 36.0–46.0)
Hemoglobin: 9.2 g/dL — ABNORMAL LOW (ref 12.0–15.0)
Immature Granulocytes: 0 %
Lymphocytes Relative: 8 %
Lymphs Abs: 0.2 10*3/uL — ABNORMAL LOW (ref 0.7–4.0)
MCH: 30.8 pg (ref 26.0–34.0)
MCHC: 34.2 g/dL (ref 30.0–36.0)
MCV: 90 fL (ref 80.0–100.0)
Monocytes Absolute: 0.2 10*3/uL (ref 0.1–1.0)
Monocytes Relative: 10 %
Neutro Abs: 1.9 10*3/uL (ref 1.7–7.7)
Neutrophils Relative %: 81 %
Platelet Count: 147 10*3/uL — ABNORMAL LOW (ref 150–400)
RBC: 2.99 MIL/uL — ABNORMAL LOW (ref 3.87–5.11)
RDW: 14.5 % (ref 11.5–15.5)
WBC Count: 2.4 10*3/uL — ABNORMAL LOW (ref 4.0–10.5)
nRBC: 0 % (ref 0.0–0.2)

## 2021-08-31 MED ORDER — SODIUM CHLORIDE 0.9 % IV SOLN
150.0000 mg | Freq: Once | INTRAVENOUS | Status: AC
  Administered 2021-08-31: 12:00:00 150 mg via INTRAVENOUS
  Filled 2021-08-31: qty 150

## 2021-08-31 MED ORDER — POTASSIUM CHLORIDE IN NACL 20-0.9 MEQ/L-% IV SOLN
Freq: Once | INTRAVENOUS | Status: AC
  Filled 2021-08-31: qty 1000

## 2021-08-31 MED ORDER — SODIUM CHLORIDE 0.9 % IV SOLN
Freq: Once | INTRAVENOUS | Status: AC
Start: 2021-08-31 — End: 2021-08-31

## 2021-08-31 MED ORDER — SODIUM CHLORIDE 0.9% FLUSH
10.0000 mL | Freq: Once | INTRAVENOUS | Status: AC
  Administered 2021-08-31: 09:00:00 10 mL

## 2021-08-31 MED ORDER — SODIUM CHLORIDE 0.9% FLUSH
10.0000 mL | INTRAVENOUS | Status: DC | PRN
  Administered 2021-08-31: 16:00:00 10 mL

## 2021-08-31 MED ORDER — MAGNESIUM SULFATE 2 GM/50ML IV SOLN
2.0000 g | Freq: Once | INTRAVENOUS | Status: AC
  Administered 2021-08-31: 10:00:00 2 g via INTRAVENOUS
  Filled 2021-08-31: qty 50

## 2021-08-31 MED ORDER — HEPARIN SOD (PORK) LOCK FLUSH 100 UNIT/ML IV SOLN
500.0000 [IU] | Freq: Once | INTRAVENOUS | Status: AC | PRN
  Administered 2021-08-31: 16:00:00 500 [IU]

## 2021-08-31 MED ORDER — PALONOSETRON HCL INJECTION 0.25 MG/5ML
0.2500 mg | Freq: Once | INTRAVENOUS | Status: AC
  Administered 2021-08-31: 12:00:00 0.25 mg via INTRAVENOUS
  Filled 2021-08-31: qty 5

## 2021-08-31 MED ORDER — SODIUM CHLORIDE 0.9 % IV SOLN
40.0000 mg/m2 | Freq: Once | INTRAVENOUS | Status: AC
  Administered 2021-08-31: 13:00:00 73 mg via INTRAVENOUS
  Filled 2021-08-31: qty 73

## 2021-08-31 MED ORDER — SODIUM CHLORIDE 0.9 % IV SOLN
10.0000 mg | Freq: Once | INTRAVENOUS | Status: AC
  Administered 2021-08-31: 12:00:00 10 mg via INTRAVENOUS
  Filled 2021-08-31: qty 10

## 2021-08-31 NOTE — Patient Instructions (Signed)
[facility]  Discharge Instructions: Thank you for choosing [facility] to provide your oncology and hematology care.   If you have a lab appointment with the Cancer Center, please go directly to the Cancer Center and check in at the registration area.   Wear comfortable clothing and clothing appropriate for easy access to any Portacath or PICC line.   We strive to give you quality time with your provider. You may need to reschedule your appointment if you arrive late (15 or more minutes).  Arriving late affects you and other patients whose appointments are after yours.  Also, if you miss three or more appointments without notifying the office, you may be dismissed from the clinic at the provider's discretion.      For prescription refill requests, have your pharmacy contact our office and allow 72 hours for refills to be completed.    Today you received the following chemotherapy and/or immunotherapy agents : Cisplatin    To help prevent nausea and vomiting after your treatment, we encourage you to take your nausea medication as directed.  BELOW ARE SYMPTOMS THAT SHOULD BE REPORTED IMMEDIATELY: *FEVER GREATER THAN 100.4 F (38 C) OR HIGHER *CHILLS OR SWEATING *NAUSEA AND VOMITING THAT IS NOT CONTROLLED WITH YOUR NAUSEA MEDICATION *UNUSUAL SHORTNESS OF BREATH *UNUSUAL BRUISING OR BLEEDING *URINARY PROBLEMS (pain or burning when urinating, or frequent urination) *BOWEL PROBLEMS (unusual diarrhea, constipation, pain near the anus) TENDERNESS IN MOUTH AND THROAT WITH OR WITHOUT PRESENCE OF ULCERS (sore throat, sores in mouth, or a toothache) UNUSUAL RASH, SWELLING OR PAIN  UNUSUAL VAGINAL DISCHARGE OR ITCHING   Items with * indicate a potential emergency and should be followed up as soon as possible or go to the Emergency Department if any problems should occur.  Please show the CHEMOTHERAPY ALERT CARD or IMMUNOTHERAPY ALERT CARD at check-in to  the Emergency Department and triage nurse.  Should you have questions after your visit or need to cancel or reschedule your appointment, please contact [facility]  Dept: [phone]  and follow the prompts.  Office hours are 8:00 a.m. to 4:30 p.m. Monday - Friday. Please note that voicemails left after 4:00 p.m. may not be returned until the following business day.  We are closed weekends and major holidays. You have access to a nurse at all times for urgent questions. Please call the main number to the clinic Dept: [phone] and follow the prompts.   For any non-urgent questions, you may also contact your provider using MyChart. We now offer e-Visits for anyone 18 and older to request care online for non-urgent symptoms. For details visit [email or web address].   Also download the MyChart app! Go to the app store, search "MyChart", open the app, select [facility], and log in with your MyChart username and password.  Due to Covid, a mask is required upon entering the hospital/clinic. If you do not have a mask, one will be given to you upon arrival. For doctor visits, patients may have 1 support person aged 18 or older with them. For treatment visits, patients cannot have anyone with them due to current Covid guidelines and our immunocompromised population.   

## 2021-08-31 NOTE — Progress Notes (Signed)
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 Findings showed a 7.5 x 6.9 cm heterogeneously enhancing mass is noted posteriorly in the pelvis anterior to the sacrum which is highly concerning for colorectal malignancy with possible adjacent metastatic adenopathy. 05/24/2021: colonoscopy performed by [facility]. Biopsy showed poorly differentiated high grade carcinoma, differential including tuboovarian and peritoneal. Immunophemotype not consistent with colorectal adenocarcinoma.  06/04/2021: PET CT scan performed, findings show hypermetabolic mass adjacent to the rectum, likely due to colorectal primary neoplasm and ill-defined hypermetabolic nodule is seen more inferiorly in the left perirectal fat, likely a lymph node metastasis. She was also noted to have Numerous small bilateral small solid pulmonary nodules which are below resolution for PET-CT 06/11/2021: establish care with Dr. Lorenso Courier  06/22/2021: Laparoscopic diagnostic procedure with diverting loop colostomy and anorectal exam under anesthesia.  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 07/19/2021: Start of Radiation therapy.  07/20/2021: Week 1 of Cisplatin 40mg /m2 07/27/2021: Week 2 of Cisplatin 40mg /m2 08/02/2021: Week 3 of  Cisplatin 40mg /m2 08/09/2021: Week 4 of Cisplatin 40mg /m2 08/17/2021: Week 5 of Cisplatin 40mg /m2 08/24/2021: Week 6 of Cisplatin 40mg /m2 08/31/2021:  Week 7 of Cisplatin 40mg /m2  Interval History:  Mariah Schultz 65 y.o. female with medical history significant for squamous cell cancer of unclear etiology who presents for a follow up visit. The patient's last visit was on 08/17/2021. In the interim since the last visit she continued receiving chemoradiation.  On exam today Mariah Schultz reports she has been well in the interim since her last visit.  She notes that she tolerated her last cycle of chemotherapy well without much difficulty.  She notes her pain is considerably better and is currently only 1 out of 10 in severity.  She notes that she is not currently taking the long-acting pain medication and only about 1 oxycodone per day.  She notes that her appetite is so-so and that she eats very small frequent snack like meals.  She does have some occasional pain and pressure in her rectum which she thinks may be due to mucus buildup.  She denies fevers, chills, night sweats, shortness of breath, chest pain, cough, peripheral edema or neuropathy. She has no other complaints.  A full 10 point ROS is listed below.   MEDICAL HISTORY:  Past Medical History:  Diagnosis Date   CAP (community acquired pneumonia) 09/10/2013   Hypertension    Papilloma of left breast    Papilloma of left breast 02/26/2019   Perforated appendicitis 03/19/2013    SURGICAL HISTORY: Past Surgical History:  Procedure Laterality Date   ABDOMINAL HYSTERECTOMY     BREAST EXCISIONAL BIOPSY Left 02/26/2019   B9 Biopsy   BREAST LUMPECTOMY WITH RADIOACTIVE SEED LOCALIZATION Left 02/26/2019   Procedure: LEFT BREAST LUMPECTOMY WITH RADIOACTIVE SEED LOCALIZATION;  Surgeon: Fanny Skates, MD;  Location: [facility];  Service: General;  Laterality: Left;   IR IMAGING GUIDED PORT INSERTION  07/21/2021   LAPAROSCOPIC  APPENDECTOMY N/A 02/25/2013   Procedure: APPENDECTOMY LAPAROSCOPIC;  Surgeon: Harl Bowie, MD;  Location: [facility];  Service: General;  Laterality: N/A;   LAPAROSCOPY N/A 06/22/2021   Procedure: LAPAROSCOPY DIAGNOSTIC, DIVERTING LOOP COLOSTOMY, ANORECTAL EXAM UNDER ANESTHESIA, TRANSRECTAL CORE BIOPSIES.;  Surgeon: Lafonda Mosses, MD;  Location: [facility];  Service: Gynecology;  Laterality: N/A;    SOCIAL HISTORY: Social History   Socioeconomic History   Marital status: Married    Spouse name: Not on file   Number of children: Not on file   Years of education: Not on file   Highest education level: Not on file  Occupational History   Not on file  Tobacco Use   Smoking status: Every Day    Packs/day: 1.00    Years: 40.00    Pack years: 40.00    Types: Cigarettes   Smokeless tobacco: Never  Vaping Use   Vaping Use: Never used  Substance and Sexual Activity   Alcohol use: Yes    Comment: occ   Drug use: No   Sexual activity: Not Currently    Birth control/protection: Surgical  Other Topics Concern   Not on file  Social History Narrative   Not on file   Social Determinants of Health   Financial Resource Strain: Low Risk    Difficulty of Paying Living Expenses: Not very hard  Food Insecurity: No Food Insecurity   Worried About Running Out of Food in the Last Year: Never true   Ran Out of Food in the Last Year: Never true  Transportation Needs: No Transportation Needs   Lack of Transportation (Medical): No   Lack of Transportation (Non-Medical): No  Physical Activity: Not on file  Stress: Not on file  Social Connections: Not on file  Intimate Partner Violence: Not on file    FAMILY HISTORY: Family History  Problem Relation Age of Onset   Heart disease Father    Colon cancer Neg Hx    Breast cancer Neg Hx    Ovarian cancer Neg Hx    Pancreatic cancer Neg Hx    Prostate cancer Neg Hx    Endometrial cancer Neg Hx        Patient unaware    ALLERGIES:  is  allergic to other and pneumococcal vac polyvalent.  MEDICATIONS:  Current Outpatient Medications  Medication Sig Dispense Refill   acetaminophen (TYLENOL) 500 MG tablet Take 500 mg by mouth every 6 (six) hours as needed.     aspirin 81 MG tablet Take 81 mg by mouth daily.     HYDROmorphone (DILAUDID) 2 MG tablet Take 1 tablet (2 mg total) by mouth every 6 (six) hours as needed for severe pain. 30 tablet 0   lidocaine-prilocaine (EMLA) cream Apply 1 application topically as needed. 30 g 0   Multiple Vitamin (MULTIVITAMIN) tablet Take 1 tablet by mouth daily.     Omega-3 Fatty Acids (FISH OIL PO) Take 1 capsule by mouth daily.     ondansetron (ZOFRAN) 8 MG tablet Take 1 tablet (8 mg total) by mouth every 8 (eight) hours as needed. 30 tablet 0   oxyCODONE (OXY IR/ROXICODONE) 5 MG immediate release tablet Take 1-2 tablets (5-10 mg total) by mouth every 6 (six) hours as needed for moderate pain, severe pain or breakthrough pain. 60 tablet 0   prochlorperazine (COMPAZINE) 10 MG tablet Take 1 tablet (10 mg total) by mouth every 6 (six) hours as needed for nausea or vomiting. 30 tablet 0   valsartan-hydrochlorothiazide (DIOVAN-HCT)  160-12.5 MG tablet Take 1 tablet by mouth daily.     vitamin C (ASCORBIC ACID) 500 MG tablet Take 500 mg by mouth daily.     No current facility-administered medications for this visit.    REVIEW OF SYSTEMS:   Constitutional: ( - ) fevers, ( - )  chills , ( - ) night sweats Eyes: ( - ) blurriness of vision, ( - ) double vision, ( - ) watery eyes Ears, nose, mouth, throat, and face: ( - ) mucositis, ( - ) sore throat Respiratory: ( - ) cough, ( - ) dyspnea, ( - ) wheezes Cardiovascular: ( - ) palpitation, ( - ) chest discomfort, ( - ) lower extremity swelling Gastrointestinal:  ( - ) nausea, ( - ) heartburn, ( - ) change in bowel habits Skin: ( - ) abnormal skin rashes Lymphatics: ( - ) new lymphadenopathy, ( - ) easy bruising Neurological: ( - ) numbness, ( - )  tingling, ( - ) new weaknesses Behavioral/Psych: ( - ) mood change, ( - ) new changes  All other systems were reviewed with the patient and are negative.  PHYSICAL EXAMINATION: ECOG PERFORMANCE STATUS: 1 - Symptomatic but completely ambulatory  Vitals:   08/31/21 0905  BP: 130/81  Pulse: 84  Resp: 18  Temp: (!) 97 F (36.1 C)  SpO2: 99%      Filed Weights   08/31/21 0905  Weight: 156 lb 12.8 oz (71.1 kg)     GENERAL: Well-appearing middle-aged African-American female, alert, no distress and comfortable SKIN: skin color, texture, turgor are normal, no rashes or significant lesions EYES: conjunctiva are pink and non-injected, sclera clear LUNGS: clear to auscultation and percussion with normal breathing effort HEART: regular rate & rhythm and no murmurs and no lower extremity edema ABDOMEN: Ostomy in place producing soft stool.  Soft, non-tender, non-distended, normal bowel sounds Musculoskeletal: no cyanosis of digits and no clubbing  PSYCH: alert & oriented x 3, fluent speech NEURO: no focal motor/sensory deficits  LABORATORY DATA:  I have reviewed the data as listed CBC Latest Ref Rng & Units 08/31/2021 08/23/2021 08/17/2021  WBC 4.0 - 10.5 K/uL 2.4(L) 3.2(L) 3.8(L)  Hemoglobin 12.0 - 15.0 g/dL 9.2(L) 10.2(L) 9.8(L)  Hematocrit 36.0 - 46.0 % 26.9(L) 30.3(L) 29.0(L)  Platelets 150 - 400 K/uL 147(L) 195 276    CMP Latest Ref Rng & Units 08/31/2021 08/23/2021 08/17/2021  Glucose 70 - 99 mg/dL 114(H) 106(H) 122(H)  BUN 8 - 23 mg/dL 34(H) 32(H) 28(H)  Creatinine 0.44 - 1.00 mg/dL 1.01(H) 0.95 0.83  Sodium 135 - 145 mmol/L 137 136 138  Potassium 3.5 - 5.1 mmol/L 3.6 4.3 3.5  Chloride 98 - 111 mmol/L 100 98 100  CO2 22 - 32 mmol/L 29 28 26   Calcium 8.9 - 10.3 mg/dL 9.1 9.2 9.2  Total Protein 6.5 - 8.1 g/dL 7.1 7.5 7.2  Total Bilirubin 0.3 - 1.2 mg/dL 0.3 0.3 0.2(L)  Alkaline Phos 38 - 126 U/L 151(H) 182(H) 179(H)  AST 15 - 41 U/L 15 17 17   ALT 0 - 44 U/L 15 19 20      RADIOGRAPHIC STUDIES: No results found.  ASSESSMENT & PLAN Roseline Ebarb 65 y.o. female with medical history significant for squamous cell cancer of unclear etiology who presents for a follow up visit.   Today we discussed the findings on pathology and the steps moving forward.  At this time this appears to be a squamous cell carcinoma of unclear etiology.  Based  on the radiographic review and the surgical procedure there is no clear etiology for this patient's cancer.  The patient underwent a loop colostomy with biopsy of the lesion.  At this time I recommend that we pursue chemoradiation with cisplatin 40 mg per metered squared and combination with radiation therapy.  We will continue cisplatin for the duration of radiation.  The patient voiced understanding of this plan moving forward.  We started chemotherapy on 07/20/2021 with concurrent radiation.  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 --Labs today were reviewed and adequate for treatment. Patient will proceed with Cycle 7, Day 1 of cisplatin.  --We will discuss with radiation oncology the best time for consideration of repeat imaging. --RTC in 2 weeks to re-evaluate.   #Pain Control --Xtampza 13.5mg  q12H  for long act pain control (not being used) --Oxycodone 5-10mg  q6H as needed. Sent refill today.  --dilaudid 2 mg q6H PRN for breakthrough  #Supportive Care -- chemotherapy education complete -- port  placed -- zofran 8mg  q8H PRN and compazine 10mg  PO q6H for nausea -- EMLA cream for port -- pain medication as above  No orders of the defined types were placed in this encounter.   All questions were answered. The patient knows to call the clinic with any problems, questions or concerns.  I have spent a total of 30 minutes minutes of face-to-face and non-face-to-face time, preparing to see the patient, performing a medically appropriate examination, counseling and educating the patient, ordering medications documenting clinical information in the electronic health record, and care coordination.   Ledell Peoples, MD Department of Hematology/Oncology [place] [facility] Phone: [phone] Pager: [phone] Email: [email or web address]  08/31/2021 9:45 AM

## 2021-08-31 NOTE — Progress Notes (Signed)
Ok to proceed with mag from 8 days ago per Dr. Lorenso Courier.

## 2021-09-01 ENCOUNTER — Ambulatory Visit
Admission: RE | Admit: 2021-09-01 | Discharge: 2021-09-01 | Disposition: A | Discharge status: Home / Self Care | Payer: Medicare HMO | Source: Ambulatory Visit | Attending: Radiation Oncology | Admitting: Radiation Oncology

## 2021-09-01 ENCOUNTER — Ambulatory Visit: Payer: Medicare HMO

## 2021-09-01 ENCOUNTER — Encounter: Payer: Self-pay | Admitting: Radiation Oncology

## 2021-09-01 DIAGNOSIS — C19 Malignant neoplasm of rectosigmoid junction: Secondary | ICD-10-CM | POA: Diagnosis not present

## 2021-09-01 DIAGNOSIS — R918 Other nonspecific abnormal finding of lung field: Secondary | ICD-10-CM | POA: Diagnosis not present

## 2021-09-01 DIAGNOSIS — R6881 Early satiety: Secondary | ICD-10-CM | POA: Diagnosis not present

## 2021-09-01 DIAGNOSIS — Z79899 Other long term (current) drug therapy: Secondary | ICD-10-CM | POA: Diagnosis not present

## 2021-09-01 DIAGNOSIS — F17210 Nicotine dependence, cigarettes, uncomplicated: Secondary | ICD-10-CM | POA: Diagnosis not present

## 2021-09-01 DIAGNOSIS — C763 Malignant neoplasm of pelvis: Secondary | ICD-10-CM | POA: Diagnosis not present

## 2021-09-01 DIAGNOSIS — R140 Abdominal distension (gaseous): Secondary | ICD-10-CM | POA: Diagnosis not present

## 2021-09-01 DIAGNOSIS — R5383 Other fatigue: Secondary | ICD-10-CM | POA: Diagnosis not present

## 2021-09-01 DIAGNOSIS — Z5111 Encounter for antineoplastic chemotherapy: Secondary | ICD-10-CM | POA: Diagnosis not present

## 2021-09-16 ENCOUNTER — Other Ambulatory Visit: Payer: Self-pay

## 2021-09-16 ENCOUNTER — Inpatient Hospital Stay: Payer: Medicare HMO | Admitting: Hematology and Oncology

## 2021-09-16 ENCOUNTER — Inpatient Hospital Stay: Payer: Medicare HMO | Attending: Hematology and Oncology

## 2021-09-16 ENCOUNTER — Inpatient Hospital Stay: Payer: Medicare HMO

## 2021-09-16 VITALS — BP 158/94 | HR 81 | Temp 97.8°F | Resp 17 | Wt 158.3 lb

## 2021-09-16 DIAGNOSIS — I10 Essential (primary) hypertension: Secondary | ICD-10-CM | POA: Insufficient documentation

## 2021-09-16 DIAGNOSIS — Z933 Colostomy status: Secondary | ICD-10-CM | POA: Insufficient documentation

## 2021-09-16 DIAGNOSIS — Z923 Personal history of irradiation: Secondary | ICD-10-CM | POA: Diagnosis not present

## 2021-09-16 DIAGNOSIS — C763 Malignant neoplasm of pelvis: Secondary | ICD-10-CM

## 2021-09-16 DIAGNOSIS — K6289 Other specified diseases of anus and rectum: Secondary | ICD-10-CM | POA: Diagnosis not present

## 2021-09-16 DIAGNOSIS — Z95828 Presence of other vascular implants and grafts: Secondary | ICD-10-CM | POA: Diagnosis not present

## 2021-09-16 DIAGNOSIS — C19 Malignant neoplasm of rectosigmoid junction: Secondary | ICD-10-CM | POA: Insufficient documentation

## 2021-09-16 DIAGNOSIS — R140 Abdominal distension (gaseous): Secondary | ICD-10-CM | POA: Diagnosis not present

## 2021-09-16 DIAGNOSIS — Z79899 Other long term (current) drug therapy: Secondary | ICD-10-CM | POA: Diagnosis not present

## 2021-09-16 DIAGNOSIS — Z8249 Family history of ischemic heart disease and other diseases of the circulatory system: Secondary | ICD-10-CM | POA: Diagnosis not present

## 2021-09-16 DIAGNOSIS — F17210 Nicotine dependence, cigarettes, uncomplicated: Secondary | ICD-10-CM | POA: Insufficient documentation

## 2021-09-16 DIAGNOSIS — R918 Other nonspecific abnormal finding of lung field: Secondary | ICD-10-CM | POA: Insufficient documentation

## 2021-09-16 LAB — CMP (CANCER CENTER ONLY)
ALT: 10 U/L (ref 0–44)
AST: 10 U/L — ABNORMAL LOW (ref 15–41)
Albumin: 3.4 g/dL — ABNORMAL LOW (ref 3.5–5.0)
Alkaline Phosphatase: 148 U/L — ABNORMAL HIGH (ref 38–126)
Anion gap: 7 (ref 5–15)
BUN: 32 mg/dL — ABNORMAL HIGH (ref 8–23)
CO2: 28 mmol/L (ref 22–32)
Calcium: 9 mg/dL (ref 8.9–10.3)
Chloride: 104 mmol/L (ref 98–111)
Creatinine: 1.02 mg/dL — ABNORMAL HIGH (ref 0.44–1.00)
GFR, Estimated: >60 mL/min (ref >60)
Glucose, Bld: 112 mg/dL — ABNORMAL HIGH (ref 70–99)
Potassium: 3.5 mmol/L (ref 3.5–5.1)
Sodium: 139 mmol/L (ref 135–145)
Total Bilirubin: 0.2 mg/dL — ABNORMAL LOW (ref 0.3–1.2)
Total Protein: 7 g/dL (ref 6.5–8.1)

## 2021-09-16 LAB — CBC WITH DIFFERENTIAL (CANCER CENTER ONLY)
Abs Immature Granulocytes: 0.03 10*3/uL (ref 0.00–0.07)
Basophils Absolute: 0 10*3/uL (ref 0.0–0.1)
Basophils Relative: 0 %
Eosinophils Absolute: 0 10*3/uL (ref 0.0–0.5)
Eosinophils Relative: 0 %
HCT: 24.8 % — ABNORMAL LOW (ref 36.0–46.0)
Hemoglobin: 8.7 g/dL — ABNORMAL LOW (ref 12.0–15.0)
Immature Granulocytes: 1 %
Lymphocytes Relative: 11 %
Lymphs Abs: 0.5 10*3/uL — ABNORMAL LOW (ref 0.7–4.0)
MCH: 31.8 pg (ref 26.0–34.0)
MCHC: 35.1 g/dL (ref 30.0–36.0)
MCV: 90.5 fL (ref 80.0–100.0)
Monocytes Absolute: 0.6 10*3/uL (ref 0.1–1.0)
Monocytes Relative: 13 %
Neutro Abs: 3.4 10*3/uL (ref 1.7–7.7)
Neutrophils Relative %: 75 %
Platelet Count: 207 10*3/uL (ref 150–400)
RBC: 2.74 MIL/uL — ABNORMAL LOW (ref 3.87–5.11)
RDW: 17.4 % — ABNORMAL HIGH (ref 11.5–15.5)
WBC Count: 4.6 10*3/uL (ref 4.0–10.5)
nRBC: 0 % (ref 0.0–0.2)

## 2021-09-16 MED ORDER — SODIUM CHLORIDE 0.9% FLUSH
10.0000 mL | Freq: Once | INTRAVENOUS | Status: AC
  Administered 2021-09-16: 10 mL

## 2021-09-16 MED ORDER — HEPARIN SOD (PORK) LOCK FLUSH 100 UNIT/ML IV SOLN
500.0000 [IU] | Freq: Once | INTRAVENOUS | Status: AC
  Administered 2021-09-16: 500 [IU]

## 2021-09-16 NOTE — Progress Notes (Signed)
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 Findings showed a 7.5 x 6.9 cm heterogeneously enhancing mass is noted posteriorly in the pelvis anterior to the sacrum which is highly concerning for colorectal malignancy with possible adjacent metastatic adenopathy. 05/24/2021: colonoscopy performed by [facility]. Biopsy showed poorly differentiated high grade carcinoma, differential including tuboovarian and peritoneal. Immunophemotype not consistent with colorectal adenocarcinoma.  06/04/2021: PET CT scan performed, findings show hypermetabolic mass adjacent to the rectum, likely due to colorectal primary neoplasm and ill-defined hypermetabolic nodule is seen more inferiorly in the left perirectal fat, likely a lymph node metastasis. She was also noted to have Numerous small bilateral small solid pulmonary nodules which are below resolution for PET-CT 06/11/2021: establish care with Dr. Lorenso Courier  06/22/2021: Laparoscopic diagnostic procedure with diverting loop colostomy and anorectal exam under anesthesia.  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 07/19/2021: Start of Radiation therapy.  07/20/2021: Week 1 of Cisplatin 40mg /m2 07/27/2021: Week 2 of Cisplatin 40mg /m2 08/02/2021: Week 3 of  Cisplatin 40mg /m2 08/09/2021: Week 4 of Cisplatin 40mg /m2 08/17/2021: Week 5 of Cisplatin 40mg /m2 08/24/2021: Week 6 of Cisplatin 40mg /m2 08/31/2021:  Week 7 of Cisplatin 40mg /m2 09/01/2021: end of chemoradiation.   Interval History:  Mariah Schultz 66 y.o. female with medical history significant for squamous cell cancer of unclear etiology who presents for a follow up visit. The patient's last visit was on 08/31/2021. In the interim since the last visit she completed chemoradiation.  On exam today [name] reports she has been doing "a lot better" since the end of chemotherapy.  She notes that she had a quiet Christmas holiday and overall feels well.  She notes that she is typically in a 0-1 out of 10 in pain.  She notes that she does still have some occasional mucus discharge from the rectum and it does feel like she has to use the restroom.  She notes that her appetite has been increasing and she has been also trying to eat more liver in order to bolster her blood counts.  She notes that her energy levels have been in the dumps.  She notes that she gets up in the morning and typically gets back in bed within 2 hours.  She is doing her best to walk and stay active.  She is otherwise not having any residual symptoms as result of her treatment.  She denies fevers, chills, night sweats, shortness of breath, chest pain, cough, peripheral edema or neuropathy. She has no other complaints.  A full 10 point ROS is listed below.   MEDICAL HISTORY:  Past Medical History:  Diagnosis Date   CAP (community acquired pneumonia) 09/10/2013   Hypertension    Papilloma of left breast    Papilloma of left breast 02/26/2019   Perforated appendicitis 03/19/2013    SURGICAL HISTORY: Past Surgical History:  Procedure Laterality Date   ABDOMINAL HYSTERECTOMY     BREAST EXCISIONAL BIOPSY Left 02/26/2019   B9  Biopsy   BREAST LUMPECTOMY WITH RADIOACTIVE SEED LOCALIZATION Left 02/26/2019   Procedure: LEFT  BREAST LUMPECTOMY WITH RADIOACTIVE SEED LOCALIZATION;  Surgeon: Fanny Skates, MD;  Location: [place];  Service: General;  Laterality: Left;   IR IMAGING GUIDED PORT INSERTION  07/21/2021   LAPAROSCOPIC APPENDECTOMY N/A 02/25/2013   Procedure: APPENDECTOMY LAPAROSCOPIC;  Surgeon: Harl Bowie, MD;  Location: [place];  Service: General;  Laterality: N/A;   LAPAROSCOPY N/A 06/22/2021   Procedure: LAPAROSCOPY DIAGNOSTIC, DIVERTING LOOP COLOSTOMY, ANORECTAL EXAM UNDER ANESTHESIA, TRANSRECTAL CORE BIOPSIES.;  Surgeon: Lafonda Mosses, MD;  Location: [facility];  Service: Gynecology;  Laterality: N/A;    SOCIAL HISTORY: Social History   Socioeconomic History   Marital status: Married    Spouse name: Not on file   Number of children: Not on file   Years of education: Not on file   Highest education level: Not on file  Occupational History   Not on file  Tobacco Use   Smoking status: Every Day    Packs/day: 1.00    Years: 40.00    Pack years: 40.00    Types: Cigarettes   Smokeless tobacco: Never  Vaping Use   Vaping Use: Never used  Substance and Sexual Activity   Alcohol use: Yes    Comment: occ   Drug use: No   Sexual activity: Not Currently    Birth control/protection: Surgical  Other Topics Concern   Not on file  Social History Narrative   Not on file   Social Determinants of Health   Financial Resource Strain: Low Risk    Difficulty of Paying Living Expenses: Not very hard  Food Insecurity: No Food Insecurity   Worried About Running Out of Food in the Last Year: Never true   Ran Out of Food in the Last Year: Never true  Transportation Needs: No Transportation Needs   Lack of Transportation (Medical): No   Lack of Transportation (Non-Medical): No  Physical Activity: Not on file  Stress: Not on file  Social Connections: Not on file  Intimate Partner Violence: Not on file    FAMILY HISTORY: Family History  Problem Relation Age of Onset    Heart disease Father    Colon cancer Neg Hx    Breast cancer Neg Hx    Ovarian cancer Neg Hx    Pancreatic cancer Neg Hx    Prostate cancer Neg Hx    Endometrial cancer Neg Hx        Patient unaware    ALLERGIES:  is allergic to other and pneumococcal vac polyvalent.  MEDICATIONS:  Current Outpatient Medications  Medication Sig Dispense Refill   acetaminophen (TYLENOL) 500 MG tablet Take 500 mg by mouth every 6 (six) hours as needed.     aspirin 81 MG tablet Take 81 mg by mouth daily.     HYDROmorphone (DILAUDID) 2 MG tablet Take 1 tablet (2 mg total) by mouth every 6 (six) hours as needed for severe pain. (Patient not taking: Reported on 09/16/2021) 30 tablet 0   lidocaine-prilocaine (EMLA) cream Apply 1 application topically as needed. 30 g 0   Multiple Vitamin (MULTIVITAMIN) tablet Take 1 tablet by mouth daily.     Omega-3 Fatty Acids (FISH OIL PO) Take 1 capsule by mouth daily.     ondansetron (ZOFRAN) 8 MG tablet Take 1 tablet (8 mg total) by mouth every 8 (eight) hours as needed. 30 tablet 0   oxyCODONE (OXY IR/ROXICODONE) 5 MG immediate release tablet  Take 1-2 tablets (5-10 mg total) by mouth every 6 (six) hours as needed for moderate pain, severe pain or breakthrough pain. (Patient not taking: Reported on 09/16/2021) 60 tablet 0   prochlorperazine (COMPAZINE) 10 MG tablet Take 1 tablet (10 mg total) by mouth every 6 (six) hours as needed for nausea or vomiting. 30 tablet 0   valsartan-hydrochlorothiazide (DIOVAN-HCT) 160-12.5 MG tablet Take 1 tablet by mouth daily.     vitamin C (ASCORBIC ACID) 500 MG tablet Take 500 mg by mouth daily.     No current facility-administered medications for this visit.    REVIEW OF SYSTEMS:   Constitutional: ( - ) fevers, ( - )  chills , ( - ) night sweats Eyes: ( - ) blurriness of vision, ( - ) double vision, ( - ) watery eyes Ears, nose, mouth, throat, and face: ( - ) mucositis, ( - ) sore throat Respiratory: ( - ) cough, ( - ) dyspnea, ( - )  wheezes Cardiovascular: ( - ) palpitation, ( - ) chest discomfort, ( - ) lower extremity swelling Gastrointestinal:  ( - ) nausea, ( - ) heartburn, ( - ) change in bowel habits Skin: ( - ) abnormal skin rashes Lymphatics: ( - ) new lymphadenopathy, ( - ) easy bruising Neurological: ( - ) numbness, ( - ) tingling, ( - ) new weaknesses Behavioral/Psych: ( - ) mood change, ( - ) new changes  All other systems were reviewed with the patient and are negative.  PHYSICAL EXAMINATION: ECOG PERFORMANCE STATUS: 1 - Symptomatic but completely ambulatory  Vitals:   09/16/21 1200  BP: (!) 158/94  Pulse: 81  Resp: 17  Temp: 97.8 F (36.6 C)  SpO2: 100%      Filed Weights   09/16/21 1200  Weight: 158 lb 4.8 oz (71.8 kg)     GENERAL: Well-appearing middle-aged African-American female, alert, no distress and comfortable SKIN: skin color, texture, turgor are normal, no rashes or significant lesions EYES: conjunctiva are pink and non-injected, sclera clear LUNGS: clear to auscultation and percussion with normal breathing effort HEART: regular rate & rhythm and no murmurs and no lower extremity edema ABDOMEN: Ostomy in place producing soft stool.  Soft, non-tender, non-distended, normal bowel sounds Musculoskeletal: no cyanosis of digits and no clubbing  PSYCH: alert & oriented x 3, fluent speech NEURO: no focal motor/sensory deficits  LABORATORY DATA:  I have reviewed the data as listed CBC Latest Ref Rng & Units 09/16/2021 08/31/2021 08/23/2021  WBC 4.0 - 10.5 K/uL 4.6 2.4(L) 3.2(L)  Hemoglobin 12.0 - 15.0 g/dL 8.7(L) 9.2(L) 10.2(L)  Hematocrit 36.0 - 46.0 % 24.8(L) 26.9(L) 30.3(L)  Platelets 150 - 400 K/uL 207 147(L) 195    CMP Latest Ref Rng & Units 09/16/2021 08/31/2021 08/23/2021  Glucose 70 - 99 mg/dL 112(H) 114(H) 106(H)  BUN 8 - 23 mg/dL 32(H) 34(H) 32(H)  Creatinine 0.44 - 1.00 mg/dL 1.02(H) 1.01(H) 0.95  Sodium 135 - 145 mmol/L 139 137 136  Potassium 3.5 - 5.1 mmol/L 3.5  3.6 4.3  Chloride 98 - 111 mmol/L 104 100 98  CO2 22 - 32 mmol/L 28 29 28   Calcium 8.9 - 10.3 mg/dL 9.0 9.1 9.2  Total Protein 6.5 - 8.1 g/dL 7.0 7.1 7.5  Total Bilirubin 0.3 - 1.2 mg/dL 0.2(L) 0.3 0.3  Alkaline Phos 38 - 126 U/L 148(H) 151(H) 182(H)  AST 15 - 41 U/L 10(L) 15 17  ALT 0 - 44 U/L 10 15 19     RADIOGRAPHIC  STUDIES: No results found.  ASSESSMENT & PLAN Mariah Schultz 66 y.o. female with medical history significant for squamous cell cancer of unclear etiology who presents for a follow up visit.   Previously we discussed the findings on pathology and the steps moving forward.  At this time this appears to be a squamous cell carcinoma of unclear etiology.  Based on the radiographic review and the surgical procedure there is no clear etiology for this patient's cancer.  The patient underwent a loop colostomy with biopsy of the lesion.  At this time I recommend that we pursue chemoradiation with cisplatin 40 mg per metered squared and combination with radiation therapy.  We will continue cisplatin for the duration of radiation.  The patient voiced understanding of this plan moving forward.  We started chemotherapy on 07/20/2021 with concurrent radiation.  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 Completed therapy on 09/01/2021.  --We will discuss with radiation oncology the best time for consideration of repeat imaging. Last imaging in Oct 2022.  --RTC in 4 weeks  to re-evaluate.   #Pain Control --Xtampza 13.5mg  q12H  for long act pain control (not being used) --Oxycodone 5-10mg  q6H as needed.  --dilaudid 2 mg q6H PRN for breakthrough(not being used)  #Supportive Care -- chemotherapy education complete -- port placed -- zofran 8mg  q8H PRN and compazine 10mg  PO q6H for nausea -- EMLA cream for port -- pain medication as above  No orders of the defined types were placed in this encounter.   All questions were answered. The patient knows to call the clinic with any problems, questions or concerns.  I have spent a total of 30 minutes minutes of face-to-face and non-face-to-face time, preparing to see the patient, performing a medically appropriate examination, counseling and educating the patient, ordering medications documenting clinical information in the electronic health record, and care coordination.   Ledell Peoples, MD Department of Hematology/Oncology [place] [facility] Phone: [phone] Pager: [phone] Email: [email or web address]  09/20/2021 11:00 AM

## 2021-09-20 ENCOUNTER — Encounter: Payer: Self-pay | Admitting: Hematology and Oncology

## 2021-09-20 ENCOUNTER — Telehealth: Payer: Self-pay | Admitting: Hematology and Oncology

## 2021-09-20 ENCOUNTER — Other Ambulatory Visit: Payer: Self-pay | Admitting: Hematology and Oncology

## 2021-09-20 NOTE — Telephone Encounter (Signed)
Scheduled appointment per 1/16 scheduling message. Patient is aware.

## 2021-09-27 ENCOUNTER — Ambulatory Visit: Payer: Medicare HMO

## 2021-09-27 ENCOUNTER — Telehealth: Payer: Self-pay

## 2021-09-27 NOTE — Telephone Encounter (Signed)
Nutrition  Called patient for nutrition follow-up.  No answer. Left message with call back number  Martasia Talamante B. Grissel Tyrell, RD, LDN Registered Dietitian [phone] (mobile)  

## 2021-09-27 NOTE — Progress Notes (Signed)
Nutrition Follow-up:   Patient with SCC of unclear etiology, questionable colorectal malignancy, tuboovarian and peritoneal.  S/p diverting loop colostomy on 10/18.  Patient has completed chemotherapy and radiation.    Patient returned RD's call.  Spoke with patient via phone.  Patient reports that appetite is better and eating q 2 hours.  Some foods still are not tasting normal.  Does not like coffee anymore, smell makes her nauseated.  Meats have to be seasoned for her to be able to taste them.  Has been eating liver to help with blood levels. Drinking protein shakes 1-2 times per day.  Energy level is still not good.      Medications: reviewed  Labs: reviewed  Anthropometrics:   Weight 158 lb 4.8 oz on 1/12  156 lb 12 oz on 12/19 162 lb 12.8 oz on 11/28 165 lb 8 oz on 11/21 170 lb on 9/30   NUTRITION DIAGNOSIS: Inadequate oral intake improved   INTERVENTION:  Encouraged patient to continue oral nutrition supplements for added nutrition Encouraged good sources of protein (lean) and well balanced diet Encouraged activity/exercise as able. Encouraged patient to contact MD with concerns about fatigue if worsens.   Patient has contact information   NEXT VISIT: no follow-up planned at this time RD available if needed  Mariah Schultz, [place], [place] Registered Dietitian [phone] (mobile)

## 2021-09-30 ENCOUNTER — Encounter: Payer: Self-pay | Admitting: Radiology

## 2021-09-30 ENCOUNTER — Other Ambulatory Visit: Payer: Self-pay | Admitting: Hematology and Oncology

## 2021-09-30 DIAGNOSIS — C763 Malignant neoplasm of pelvis: Secondary | ICD-10-CM

## 2021-10-06 ENCOUNTER — Telehealth: Payer: Self-pay | Admitting: *Deleted

## 2021-10-06 NOTE — Progress Notes (Incomplete)
°  Radiation Oncology         [phone] ________________________________  Patient Name: Mariah Schultz MRN: [id number] DOB: 1955/12/31 Referring Physician: Michael Boston (Profile Not Attached) Date of Service: 09/01/2021 [facility], [place]                                                        End Of Treatment Note  Diagnoses: R19.09-Other intra-abdominal and pelvic swelling, mass and lump  Cancer Staging:  Pelvic mass- poorly differentiated high-grade carcinoma -squamous cell carcinoma of unclear etiology  Intent: Curative  Radiation Treatment Dates: 07/19/2021 through 09/01/2021 Site Technique Total Dose (Gy) Dose per Fx (Gy) Completed Fx Beam Energies  Pelvis: Pelvis IMRT 45/45 1.8 25/25 6X  Pelvis: Pelvis_Bst IMRT 9/9 1.8 5/5 6X   Narrative: The patient tolerated radiation therapy relatively well. She reports fatigue, tenderness to pelvic area, diarrhea, urinary frequency/urgency. She did have some issues with constipation but this has since resolved/improved. She is no longer requiring her long-acting narcotic and only occasionally takes her short acting narcotic.  She reports her pain at a level of 1-2 at this time compared to 7-8 prior to her radiation treatment.  Overall she is quite pleased with her pain improvement  Plan: The patient will follow-up with radiation oncology in one month .  ________________________________________________ -----------------------------------  Blair Promise, PhD, MD  This document serves as a record of services personally performed by Gery Pray, MD. It was created on his behalf by Roney Mans, a trained medical scribe. The creation of this record is based on the scribe's personal observations and the provider's statements to them. This document has been checked and approved by the attending provider.

## 2021-10-06 NOTE — Progress Notes (Signed)
Radiation Oncology         [phone] ________________________________  Name: Mariah Schultz MRN: [id number]  Date: 10/07/2021  DOB: 1955-10-06  Follow-Up Visit Note  CC: Seward Carol, MD  Michael Boston, MD    ICD-10-CM   1. Squamous Cell Carcinoma of presacral region   C76.3     2. Pelvic mass in female  R19.00       Diagnosis:  Pelvic mass- poorly differentiated high-grade carcinoma -squamous cell carcinoma of unclear etiology  Interval Since Last Radiation:  1 month and 5 days   Intent: Curative  Radiation Treatment Dates: 07/19/2021 through 09/01/2021 Site Technique Total Dose (Gy) Dose per Fx (Gy) Completed Fx Beam Energies  Pelvis: Pelvis IMRT 45/45 1.8 25/25 6X  Pelvis: Pelvis_Bst IMRT 9/9 1.8 5/5 6X    Narrative:  The patient returns today for routine follow-up. The patient tolerated radiation therapy relatively well. On the date of her final weekly treatment chest, she reported fatigue, tenderness to pelvic area, diarrhea, urinary frequency/urgency. She did also report some issues with constipation but this had since resolved/improved. She no longer required her long-acting narcotic and only occasionally reported taking her short acting narcotic. She also reported improvement in her pain throughout the course of treatment.  Since she was last seen for re-evaluation on 07/08/21, the patient completed chemotherapy consisting of cisplatin on 07/20/21 through 08/31/21 under the care of Dr. Lorenso Courier. During her most recent follow up visit with Dr. Lorenso Courier on 09/16/21, the patient reported ongoing decrease in energy level, and stated that she typically returns to bed shortly after waking up in the mornings. She also reported an increase in appetite, and stated that she is trying her best to stay active. (Her main side effects of systemic treatment were loss of appetite and fatigue/lack of energy).   She reports feeling much better at this time.  She denies any pain  whatsoever.  She continues to be bothered by mucus drainage from the rectum.  This volume has decreased but continues to be quite frequent and she has to wear a pad concerning this issue.  She reports that it will awaken her approximately every 2 hours at night.  Allergies:  is allergic to other and pneumococcal vac polyvalent.  Meds: Current Outpatient Medications  Medication Sig Dispense Refill   acetaminophen (TYLENOL) 500 MG tablet Take 500 mg by mouth every 6 (six) hours as needed.     aspirin 81 MG tablet Take 81 mg by mouth daily.     lidocaine-prilocaine (EMLA) cream Apply 1 application topically as needed. 30 g 0   Multiple Vitamin (MULTIVITAMIN) tablet Take 1 tablet by mouth daily.     Omega-3 Fatty Acids (FISH OIL PO) Take 1 capsule by mouth daily.     oxyCODONE (OXY IR/ROXICODONE) 5 MG immediate release tablet Take 1-2 tablets (5-10 mg total) by mouth every 6 (six) hours as needed for moderate pain, severe pain or breakthrough pain. 60 tablet 0   valsartan-hydrochlorothiazide (DIOVAN-HCT) 160-12.5 MG tablet Take 1 tablet by mouth daily.     vitamin C (ASCORBIC ACID) 500 MG tablet Take 500 mg by mouth daily.     HYDROmorphone (DILAUDID) 2 MG tablet Take 1 tablet (2 mg total) by mouth every 6 (six) hours as needed for severe pain. (Patient not taking: Reported on 09/16/2021) 30 tablet 0   ondansetron (ZOFRAN) 8 MG tablet Take 1 tablet (8 mg total) by mouth every 8 (eight) hours as needed. (Patient not taking: Reported on  10/07/2021) 30 tablet 0   prochlorperazine (COMPAZINE) 10 MG tablet Take 1 tablet (10 mg total) by mouth every 6 (six) hours as needed for nausea or vomiting. (Patient not taking: Reported on 10/07/2021) 30 tablet 0   No current facility-administered medications for this encounter.    Physical Findings: The patient is in no acute distress. Patient is alert and oriented.  height is 5\' 4"  (1.626 m) and weight is 162 lb (73.5 kg). Her temporal temperature is 97 F (36.1 C)  (abnormal). Her blood pressure is 139/77 and her pulse is 87. Her respiration is 18 and oxygen saturation is 100%. .   Lungs are clear to auscultation bilaterally. Heart has regular rate and rhythm. No palpable cervical, supraclavicular, or axillary adenopathy. Abdomen soft, non-tender, normal bowel sounds.  Colostomy in place and working well   Lab Findings: Lab Results  Component Value Date   WBC 4.6 09/16/2021   HGB 8.7 (L) 09/16/2021   HCT 24.8 (L) 09/16/2021   MCV 90.5 09/16/2021   PLT 207 09/16/2021    Radiographic Findings: No results found.  Impression:  Pelvic mass- poorly differentiated high-grade carcinoma -squamous cell carcinoma of unclear etiology  The patient is recovering from the effects of radiation.  Her pain has improved significantly with her radiation to the pelvis and radiosensitizing chemotherapy.  Plan: She will proceed with an MRI of the pelvis on February 11 follow-up with Dr. Lorenso Courier soon afterward.  Routine follow-up in 3 months.   _____________________  Blair Promise, PhD, MD  This document serves as a record of services personally performed by Gery Pray, MD. It was created on his behalf by Roney Mans, a trained medical scribe. The creation of this record is based on the scribe's personal observations and the provider's statements to them. This document has been checked and approved by the attending provider.

## 2021-10-06 NOTE — Telephone Encounter (Signed)
CALLED PATIENT TO ALTER FU FOR 10-07-21 DUE TO DR. KINARD BEING IN THE OR, RESCHEDULED FOR 10-07-21 @ 10:15 AM, SPOKE WITH PATIENT AND SHE  AGREED TO NEW TIME ON 10-07-21

## 2021-10-07 ENCOUNTER — Encounter: Payer: Self-pay | Admitting: Radiation Oncology

## 2021-10-07 ENCOUNTER — Other Ambulatory Visit: Payer: Self-pay

## 2021-10-07 ENCOUNTER — Telehealth: Payer: Self-pay | Admitting: *Deleted

## 2021-10-07 ENCOUNTER — Ambulatory Visit
Admission: RE | Admit: 2021-10-07 | Discharge: 2021-10-07 | Disposition: A | Discharge status: Home / Self Care | Payer: Medicare HMO | Source: Ambulatory Visit | Attending: Radiation Oncology | Admitting: Radiation Oncology

## 2021-10-07 VITALS — BP 139/77 | HR 87 | Temp 97.0°F | Resp 18 | Ht 64.0 in | Wt 162.0 lb

## 2021-10-07 DIAGNOSIS — C801 Malignant (primary) neoplasm, unspecified: Secondary | ICD-10-CM | POA: Insufficient documentation

## 2021-10-07 DIAGNOSIS — R1900 Intra-abdominal and pelvic swelling, mass and lump, unspecified site: Secondary | ICD-10-CM

## 2021-10-07 DIAGNOSIS — Z79899 Other long term (current) drug therapy: Secondary | ICD-10-CM | POA: Diagnosis not present

## 2021-10-07 DIAGNOSIS — Z7982 Long term (current) use of aspirin: Secondary | ICD-10-CM | POA: Insufficient documentation

## 2021-10-07 DIAGNOSIS — Z923 Personal history of irradiation: Secondary | ICD-10-CM | POA: Insufficient documentation

## 2021-10-07 DIAGNOSIS — C763 Malignant neoplasm of pelvis: Secondary | ICD-10-CM

## 2021-10-07 NOTE — Progress Notes (Addendum)
Mariah Schultz is here today for follow up post radiation to the pelvic.  They completed their radiation on: 09/01/21  Does the patient complain of any of the following:  Pain: No Abdominal bloating: no Diarrhea/Constipation: patient has colostomy bag.  Nausea/Vomiting: no Vaginal Discharge: no Blood in Urine or Stool: no Urinary Issues (dysuria/incomplete emptying/ incontinence/ increased frequency/urgency): no Does patient report using vaginal dilator 2-3 times a week and/or sexually active 2-3 weeks: Patient given xs+ and s vaginal dilators. Education provided. Patient and spouse voiced understanding.  Post radiation skin changes: continues to have hyperpigmentation.    Additional comments if applicable:   Vitals:   10/07/21 1033  BP: 139/77  Pulse: 87  Resp: 18  Temp: (!) 97 F (36.1 C)  TempSrc: Temporal  SpO2: 100%  Weight: 162 lb (73.5 kg)  Height: 5\' 4"  (1.626 m)

## 2021-10-07 NOTE — Telephone Encounter (Signed)
Called to make pt aware of scheduled MRI appt for 10/25/21 at 12pm. Pt wanted to reschedule before next appt with Dr.Dorsey on 2/16. Pt was given number to centralized scheduling to reschedule. Pt verbalized understanding

## 2021-10-16 ENCOUNTER — Ambulatory Visit (HOSPITAL_COMMUNITY)
Admission: RE | Admit: 2021-10-16 | Discharge: 2021-10-16 | Disposition: A | Discharge status: Home / Self Care | Payer: Medicare HMO | Source: Ambulatory Visit | Attending: Hematology and Oncology | Admitting: Hematology and Oncology

## 2021-10-16 DIAGNOSIS — K6289 Other specified diseases of anus and rectum: Secondary | ICD-10-CM | POA: Diagnosis not present

## 2021-10-16 DIAGNOSIS — C44529 Squamous cell carcinoma of skin of other part of trunk: Secondary | ICD-10-CM | POA: Diagnosis not present

## 2021-10-16 DIAGNOSIS — K4020 Bilateral inguinal hernia, without obstruction or gangrene, not specified as recurrent: Secondary | ICD-10-CM | POA: Diagnosis not present

## 2021-10-16 DIAGNOSIS — C763 Malignant neoplasm of pelvis: Secondary | ICD-10-CM | POA: Diagnosis not present

## 2021-10-16 DIAGNOSIS — K5730 Diverticulosis of large intestine without perforation or abscess without bleeding: Secondary | ICD-10-CM | POA: Diagnosis not present

## 2021-10-21 ENCOUNTER — Inpatient Hospital Stay: Payer: Medicare HMO | Attending: Hematology and Oncology

## 2021-10-21 ENCOUNTER — Inpatient Hospital Stay: Payer: Medicare HMO | Admitting: Hematology and Oncology

## 2021-10-21 ENCOUNTER — Other Ambulatory Visit: Payer: Self-pay

## 2021-10-21 VITALS — BP 136/83 | HR 78 | Temp 97.0°F | Resp 16 | Wt 164.4 lb

## 2021-10-21 DIAGNOSIS — Z9221 Personal history of antineoplastic chemotherapy: Secondary | ICD-10-CM | POA: Diagnosis not present

## 2021-10-21 DIAGNOSIS — C19 Malignant neoplasm of rectosigmoid junction: Secondary | ICD-10-CM | POA: Insufficient documentation

## 2021-10-21 DIAGNOSIS — Z8249 Family history of ischemic heart disease and other diseases of the circulatory system: Secondary | ICD-10-CM | POA: Insufficient documentation

## 2021-10-21 DIAGNOSIS — Z933 Colostomy status: Secondary | ICD-10-CM | POA: Diagnosis not present

## 2021-10-21 DIAGNOSIS — K4020 Bilateral inguinal hernia, without obstruction or gangrene, not specified as recurrent: Secondary | ICD-10-CM | POA: Diagnosis not present

## 2021-10-21 DIAGNOSIS — C763 Malignant neoplasm of pelvis: Secondary | ICD-10-CM

## 2021-10-21 DIAGNOSIS — R918 Other nonspecific abnormal finding of lung field: Secondary | ICD-10-CM | POA: Diagnosis not present

## 2021-10-21 DIAGNOSIS — Z923 Personal history of irradiation: Secondary | ICD-10-CM | POA: Diagnosis not present

## 2021-10-21 DIAGNOSIS — F17210 Nicotine dependence, cigarettes, uncomplicated: Secondary | ICD-10-CM | POA: Diagnosis not present

## 2021-10-21 DIAGNOSIS — Z79899 Other long term (current) drug therapy: Secondary | ICD-10-CM | POA: Insufficient documentation

## 2021-10-21 DIAGNOSIS — Z95828 Presence of other vascular implants and grafts: Secondary | ICD-10-CM

## 2021-10-21 DIAGNOSIS — K6289 Other specified diseases of anus and rectum: Secondary | ICD-10-CM | POA: Diagnosis not present

## 2021-10-21 DIAGNOSIS — K5730 Diverticulosis of large intestine without perforation or abscess without bleeding: Secondary | ICD-10-CM | POA: Insufficient documentation

## 2021-10-21 LAB — CBC WITH DIFFERENTIAL (CANCER CENTER ONLY)
Abs Immature Granulocytes: 0.01 10*3/uL (ref 0.00–0.07)
Basophils Absolute: 0 10*3/uL (ref 0.0–0.1)
Basophils Relative: 0 %
Eosinophils Absolute: 0.1 10*3/uL (ref 0.0–0.5)
Eosinophils Relative: 2 %
HCT: 29.2 % — ABNORMAL LOW (ref 36.0–46.0)
Hemoglobin: 9.8 g/dL — ABNORMAL LOW (ref 12.0–15.0)
Immature Granulocytes: 0 %
Lymphocytes Relative: 9 %
Lymphs Abs: 0.5 10*3/uL — ABNORMAL LOW (ref 0.7–4.0)
MCH: 32.6 pg (ref 26.0–34.0)
MCHC: 33.6 g/dL (ref 30.0–36.0)
MCV: 97 fL (ref 80.0–100.0)
Monocytes Absolute: 0.5 10*3/uL (ref 0.1–1.0)
Monocytes Relative: 9 %
Neutro Abs: 4.8 10*3/uL (ref 1.7–7.7)
Neutrophils Relative %: 80 %
Platelet Count: 206 10*3/uL (ref 150–400)
RBC: 3.01 MIL/uL — ABNORMAL LOW (ref 3.87–5.11)
RDW: 17.7 % — ABNORMAL HIGH (ref 11.5–15.5)
WBC Count: 6 10*3/uL (ref 4.0–10.5)
nRBC: 0 % (ref 0.0–0.2)

## 2021-10-21 LAB — CMP (CANCER CENTER ONLY)
ALT: 9 U/L (ref 0–44)
AST: 11 U/L — ABNORMAL LOW (ref 15–41)
Albumin: 3.7 g/dL (ref 3.5–5.0)
Alkaline Phosphatase: 109 U/L (ref 38–126)
Anion gap: 7 (ref 5–15)
BUN: 30 mg/dL — ABNORMAL HIGH (ref 8–23)
CO2: 29 mmol/L (ref 22–32)
Calcium: 9.3 mg/dL (ref 8.9–10.3)
Chloride: 106 mmol/L (ref 98–111)
Creatinine: 1.08 mg/dL — ABNORMAL HIGH (ref 0.44–1.00)
GFR, Estimated: 56 mL/min — ABNORMAL LOW (ref >60)
Glucose, Bld: 115 mg/dL — ABNORMAL HIGH (ref 70–99)
Potassium: 3.8 mmol/L (ref 3.5–5.1)
Sodium: 142 mmol/L (ref 135–145)
Total Bilirubin: 0.3 mg/dL (ref 0.3–1.2)
Total Protein: 7.2 g/dL (ref 6.5–8.1)

## 2021-10-21 MED ORDER — SODIUM CHLORIDE 0.9% FLUSH
10.0000 mL | Freq: Once | INTRAVENOUS | Status: AC
  Administered 2021-10-21: 10 mL

## 2021-10-21 MED ORDER — HEPARIN SOD (PORK) LOCK FLUSH 100 UNIT/ML IV SOLN
500.0000 [IU] | Freq: Once | INTRAVENOUS | Status: AC
  Administered 2021-10-21: 500 [IU]

## 2021-10-21 NOTE — Progress Notes (Signed)
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 Findings showed a 7.5 x 6.9 cm heterogeneously enhancing mass is noted posteriorly in the pelvis anterior to the sacrum which is highly concerning for colorectal malignancy with possible adjacent metastatic adenopathy. 05/24/2021: colonoscopy performed by [facility]. Biopsy showed poorly differentiated high grade carcinoma, differential including tuboovarian and peritoneal. Immunophemotype not consistent with colorectal adenocarcinoma.  06/04/2021: PET CT scan performed, findings show hypermetabolic mass adjacent to the rectum, likely due to colorectal primary neoplasm and ill-defined hypermetabolic nodule is seen more inferiorly in the left perirectal fat, likely a lymph node metastasis. She was also noted to have Numerous small bilateral small solid pulmonary nodules which are below resolution for PET-CT 06/11/2021: establish care with Dr. Lorenso Courier  06/22/2021: Laparoscopic diagnostic procedure with diverting loop colostomy and anorectal exam under anesthesia.  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 07/19/2021: Start of Radiation therapy.  07/20/2021: Week 1 of Cisplatin 40mg /m2 47/42/5956: Week 2 of Cisplatin 40mg /m2 08/02/2021: Week 3 of  Cisplatin 40mg /m2 08/09/2021: Week 4 of Cisplatin 40mg /m2 08/17/2021: Week 5 of Cisplatin 40mg /m2 08/24/2021: Week 6 of Cisplatin 40mg /m2 08/31/2021:  Week 7 of Cisplatin 40mg /m2 09/01/2021: end of chemoradiation.  10/16/2021: MRI Pelvis showed response to therapy of the soft tissue mass centered about the right-side of the rectosigmoid junction  Interval History:  Mariah Schultz 66 y.o. female with medical history significant for squamous cell cancer of unclear etiology who presents for a follow up visit. The patient's last visit was on 09/16/2021. In the interim since the last visit she completed chemoradiation.  On exam today Mariah Schultz reports has been well overall in the interim since her last visit.  Her appetite has been strong and her energy is improving.  She reports her energy is about a 7 out of 10.  She notes she is not currently having any pain.  She continues to pass some mucus from her rectum though it is decreased in volume and frequency.  She notes that she is not having any urinary issues or issues with bleeding.  Overall she feels well and has no questions concerns or complaints.  She denies fevers, chills, night sweats, shortness of breath, chest pain, cough, peripheral edema or neuropathy.   A full 10 point ROS is listed below.   MEDICAL HISTORY:  Past Medical History:  Diagnosis Date   CAP (community acquired pneumonia) 09/10/2013   History of radiation therapy    pelvis 07/19/2021-09/01/2021  Dr Gery Pray   Hypertension    Papilloma of left breast    Papilloma of left breast 02/26/2019   Perforated appendicitis 03/19/2013    SURGICAL HISTORY: Past Surgical History:  Procedure Laterality Date   ABDOMINAL HYSTERECTOMY     BREAST EXCISIONAL BIOPSY Left 02/26/2019   B9 Biopsy   BREAST LUMPECTOMY WITH RADIOACTIVE SEED LOCALIZATION Left 02/26/2019   Procedure: LEFT BREAST LUMPECTOMY WITH RADIOACTIVE SEED LOCALIZATION;  Surgeon: Fanny Skates, MD;  Location:  [facility];  Service: General;  Laterality: Left;   IR IMAGING GUIDED PORT INSERTION  07/21/2021   LAPAROSCOPIC APPENDECTOMY N/A 02/25/2013   Procedure: APPENDECTOMY LAPAROSCOPIC;  Surgeon: Harl Bowie, MD;  Location: [facility];  Service: General;  Laterality: N/A;   LAPAROSCOPY N/A 06/22/2021   Procedure: LAPAROSCOPY DIAGNOSTIC, DIVERTING LOOP COLOSTOMY, ANORECTAL EXAM UNDER ANESTHESIA, TRANSRECTAL CORE BIOPSIES.;  Surgeon: Lafonda Mosses, MD;  Location: [facility];  Service: Gynecology;  Laterality: N/A;    SOCIAL HISTORY: Social History   Socioeconomic History   Marital status: Married    Spouse name: Not on file   Number of children: Not on file   Years of education: Not on file   Highest education level: Not on file  Occupational History   Not on file  Tobacco Use   Smoking status: Every Day    Packs/day: 1.00    Years: 40.00    Pack years: 40.00    Types: Cigarettes   Smokeless tobacco: Never  Vaping Use   Vaping Use: Never used  Substance and Sexual Activity   Alcohol use: Yes    Comment: occ   Drug use: No   Sexual activity: Not Currently    Birth control/protection: Surgical  Other Topics Concern   Not on file  Social History Narrative   Not on file   Social Determinants of Health   Financial Resource Strain: Low Risk    Difficulty of Paying Living Expenses: Not very hard  Food Insecurity: No Food Insecurity   Worried About Running Out of Food in the Last Year: Never true   Ran Out of Food in the Last Year: Never true  Transportation Needs: No Transportation Needs   Lack of Transportation (Medical): No   Lack of Transportation (Non-Medical): No  Physical Activity: Not on file  Stress: Not on file  Social Connections: Not on file  Intimate Partner Violence: Not on file    FAMILY HISTORY: Family History  Problem Relation Age of Onset   Heart disease Father    Colon cancer Neg Hx    Breast cancer Neg Hx    Ovarian cancer Neg Hx     Pancreatic cancer Neg Hx    Prostate cancer Neg Hx    Endometrial cancer Neg Hx        Patient unaware    ALLERGIES:  is allergic to other and pneumococcal vac polyvalent.  MEDICATIONS:  Current Outpatient Medications  Medication Sig Dispense Refill   acetaminophen (TYLENOL) 500 MG tablet Take 500 mg by mouth every 6 (six) hours as needed.     aspirin 81 MG tablet Take 81 mg by mouth daily.     lidocaine-prilocaine (EMLA) cream Apply 1 application topically as needed. 30 g 0   Multiple Vitamin (MULTIVITAMIN) tablet Take 1 tablet by mouth daily.     Omega-3 Fatty Acids (FISH OIL PO) Take 1 capsule by mouth daily.     ondansetron (ZOFRAN) 8 MG tablet Take 1 tablet (8 mg total) by mouth every 8 (eight) hours as needed. (Patient not taking: Reported on 10/07/2021) 30 tablet 0   prochlorperazine (COMPAZINE) 10 MG tablet Take 1 tablet (10 mg total) by mouth every 6 (six) hours as needed for nausea or vomiting. (Patient not taking: Reported on 10/07/2021) 30 tablet 0   valsartan-hydrochlorothiazide (DIOVAN-HCT) 160-12.5 MG tablet Take 1 tablet by mouth daily.     vitamin C (ASCORBIC ACID) 500 MG tablet Take 500 mg by mouth daily.  No current facility-administered medications for this visit.    REVIEW OF SYSTEMS:   Constitutional: ( - ) fevers, ( - )  chills , ( - ) night sweats Eyes: ( - ) blurriness of vision, ( - ) double vision, ( - ) watery eyes Ears, nose, mouth, throat, and face: ( - ) mucositis, ( - ) sore throat Respiratory: ( - ) cough, ( - ) dyspnea, ( - ) wheezes Cardiovascular: ( - ) palpitation, ( - ) chest discomfort, ( - ) lower extremity swelling Gastrointestinal:  ( - ) nausea, ( - ) heartburn, ( - ) change in bowel habits Skin: ( - ) abnormal skin rashes Lymphatics: ( - ) new lymphadenopathy, ( - ) easy bruising Neurological: ( - ) numbness, ( - ) tingling, ( - ) new weaknesses Behavioral/Psych: ( - ) mood change, ( - ) new changes  All other systems were reviewed with  the patient and are negative.  PHYSICAL EXAMINATION: ECOG PERFORMANCE STATUS: 1 - Symptomatic but completely ambulatory  Vitals:   10/21/21 1106  BP: 136/83  Pulse: 78  Resp: 16  Temp: (!) 97 F (36.1 C)  SpO2: 100%      Filed Weights   10/21/21 1106  Weight: 164 lb 6 oz (74.6 kg)     GENERAL: Well-appearing middle-aged African-American female, alert, no distress and comfortable SKIN: skin color, texture, turgor are normal, no rashes or significant lesions EYES: conjunctiva are pink and non-injected, sclera clear LUNGS: clear to auscultation and percussion with normal breathing effort HEART: regular rate & rhythm and no murmurs and no lower extremity edema ABDOMEN: Ostomy in place producing soft stool.  Soft, non-tender, non-distended, normal bowel sounds Musculoskeletal: no cyanosis of digits and no clubbing  PSYCH: alert & oriented x 3, fluent speech NEURO: no focal motor/sensory deficits  LABORATORY DATA:  I have reviewed the data as listed CBC Latest Ref Rng & Units 10/21/2021 09/16/2021 08/31/2021  WBC 4.0 - 10.5 K/uL 6.0 4.6 2.4(L)  Hemoglobin 12.0 - 15.0 g/dL 9.8(L) 8.7(L) 9.2(L)  Hematocrit 36.0 - 46.0 % 29.2(L) 24.8(L) 26.9(L)  Platelets 150 - 400 K/uL 206 207 147(L)    CMP Latest Ref Rng & Units 10/21/2021 09/16/2021 08/31/2021  Glucose 70 - 99 mg/dL 115(H) 112(H) 114(H)  BUN 8 - 23 mg/dL 30(H) 32(H) 34(H)  Creatinine 0.44 - 1.00 mg/dL 1.08(H) 1.02(H) 1.01(H)  Sodium 135 - 145 mmol/L 142 139 137  Potassium 3.5 - 5.1 mmol/L 3.8 3.5 3.6  Chloride 98 - 111 mmol/L 106 104 100  CO2 22 - 32 mmol/L 29 28 29   Calcium 8.9 - 10.3 mg/dL 9.3 9.0 9.1  Total Protein 6.5 - 8.1 g/dL 7.2 7.0 7.1  Total Bilirubin 0.3 - 1.2 mg/dL 0.3 0.2(L) 0.3  Alkaline Phos 38 - 126 U/L 109 148(H) 151(H)  AST 15 - 41 U/L 11(L) 10(L) 15  ALT 0 - 44 U/L 9 10 15     RADIOGRAPHIC STUDIES: MR Pelvis Wo Contrast  Result Date: 10/18/2021 CLINICAL DATA:  Squamous cell carcinoma of presacral  region. Poorly differentiated high-grade carcinoma of unclear origin. Status post chemotherapy and radiation therapy. EXAM: MRI PELVIS WITHOUT CONTRAST TECHNIQUE: Multiplanar multisequence MR imaging of the pelvis was performed. No intravenous contrast was administered. COMPARISON:  06/17/2021. PET of 06/04/2021. Clinic note from 10/07/2021. FINDINGS: Urinary Tract:  No distal hydroureter or bladder abnormality. Bowel: Normal small bowel caliber. Extensive colonic diverticulosis, with wall thickening involving the sigmoid, likely muscular hypertrophy. Vascular/Lymphatic: No pelvic aneurysm. The  majority of the enlarged nodes described on the prior MRI have resolved. The previously described dominant node measures 8 x 6 mm at the 4 o'clock position of the rectosigmoid junction on 18/17. Comparison 17 x 18 mm at the same level on the prior exam (when remeasured). Reproductive:  At least partial hysterectomy.  No adnexal mass. Other:  No significant free fluid. The heterogeneous mass centered about the right-side of the rectosigmoid junction is significantly decreased in size. Example 3.5 x 5.9 cm on transverse image 13/3 versus 7.2 x 8.4 cm at the same level on the prior exam (when remeasured). Based on sagittal image 21/2, 3.3 x 3.8 cm today versus 8.7 x 6.3 cm when measured in a similar fashion on the prior exam. There is circumferential rectal wall thickening in this area including on 20/7, nonspecific but possibly related to venous engorgement. No bowel obstruction. Small bilateral fat containing inguinal hernias. Musculoskeletal: No acute osseous abnormality. IMPRESSION: 1. Response to therapy of the soft tissue mass centered about the right-side of the rectosigmoid junction. 2. Decreased and resolved nodal metastasis as detailed above. 3. No new or progressive disease identified. 4. No bowel obstruction. Electronically Signed   By: Abigail Miyamoto M.D.   On: 10/18/2021 13:31    ASSESSMENT & PLAN Mariah Schultz 66 y.o. female with medical history significant for squamous cell cancer of unclear etiology who presents for a follow up visit.   Previously we discussed the findings on pathology and the steps moving forward.  At this time this appears to be a squamous cell carcinoma of unclear etiology.  Based on the radiographic review and the surgical procedure there is no clear etiology for this patient's cancer.  The patient underwent a loop colostomy with biopsy of the lesion.  At this time I recommend that we pursue chemoradiation with cisplatin 40 mg per metered squared and combination with radiation therapy.  We will continue cisplatin for the duration of radiation.  The patient voiced understanding of this plan moving forward.  We started chemotherapy on 07/20/2021 with concurrent radiation.  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 Completed therapy on 09/01/2021.  --MRI on 10/18/2021 showed marked response to therapy, with the mass decreasing in size to 3.5 x 5.9 from 7.2 x 8.4 cm.  --RTC in 12 weeks to re-evaluate    #Pain Control -- no further pain, patient has not required these medications.  --Xtampza 13.5mg  q12H  for long act pain control (not being used) --Oxycodone 5-10mg  q6H as needed.  --dilaudid 2 mg q6H PRN for breakthrough(not being used)  #Supportive Care -- chemotherapy education complete -- port placed -- zofran 8mg  q8H PRN and compazine 10mg  PO q6H for nausea --  EMLA cream for port -- pain medication as above  No orders of the defined types were placed in this encounter.   All questions were answered. The patient knows to call the clinic with any problems, questions or concerns.  I have spent a total of 30 minutes minutes of face-to-face and non-face-to-face time, preparing to see the patient, performing a medically appropriate examination, counseling and  educating the patient, ordering medications documenting clinical information in the electronic health record, and care coordination.   Ledell Peoples, MD Department of Hematology/Oncology [place] [facility] Phone: [phone] Pager: [phone] Email: [email or web address]  10/21/2021 3:46 PM

## 2021-10-22 ENCOUNTER — Telehealth: Payer: Self-pay | Admitting: Hematology and Oncology

## 2021-10-22 NOTE — Telephone Encounter (Signed)
Scheduled follow-up appointment per 2/16 los. Patient is aware.

## 2021-10-25 ENCOUNTER — Ambulatory Visit (HOSPITAL_COMMUNITY): Payer: Medicare HMO

## 2021-11-25 DIAGNOSIS — E7800 Pure hypercholesterolemia, unspecified: Secondary | ICD-10-CM | POA: Diagnosis not present

## 2021-11-25 DIAGNOSIS — F17210 Nicotine dependence, cigarettes, uncomplicated: Secondary | ICD-10-CM | POA: Diagnosis not present

## 2021-11-25 DIAGNOSIS — Z933 Colostomy status: Secondary | ICD-10-CM | POA: Diagnosis not present

## 2021-11-25 DIAGNOSIS — F17200 Nicotine dependence, unspecified, uncomplicated: Secondary | ICD-10-CM | POA: Diagnosis not present

## 2021-11-25 DIAGNOSIS — E1169 Type 2 diabetes mellitus with other specified complication: Secondary | ICD-10-CM | POA: Diagnosis not present

## 2021-11-25 DIAGNOSIS — Z0000 Encounter for general adult medical examination without abnormal findings: Secondary | ICD-10-CM | POA: Diagnosis not present

## 2021-11-25 DIAGNOSIS — C763 Malignant neoplasm of pelvis: Secondary | ICD-10-CM | POA: Diagnosis not present

## 2021-11-25 DIAGNOSIS — C189 Malignant neoplasm of colon, unspecified: Secondary | ICD-10-CM | POA: Diagnosis not present

## 2021-11-25 DIAGNOSIS — I10 Essential (primary) hypertension: Secondary | ICD-10-CM | POA: Diagnosis not present

## 2021-12-07 ENCOUNTER — Telehealth: Payer: Self-pay

## 2021-12-07 NOTE — Telephone Encounter (Signed)
Patient called to report that starting in the last week she has been experiencing numbness and tingling in her feet and left thumb. Educated on the importance of checking soles of her feet daily. ? ?Routed to Dr. Lorenso Courier ?

## 2021-12-07 NOTE — Telephone Encounter (Signed)
Relayed to patient (per Dr. Lorenso Courier) that the numbness in her feet and thumb should not be related to her chemo therapy from 2022. Patient encouraged to follow-up with PCP. ?

## 2021-12-28 DIAGNOSIS — R0989 Other specified symptoms and signs involving the circulatory and respiratory systems: Secondary | ICD-10-CM | POA: Diagnosis not present

## 2021-12-28 DIAGNOSIS — G629 Polyneuropathy, unspecified: Secondary | ICD-10-CM | POA: Diagnosis not present

## 2022-01-05 NOTE — Progress Notes (Signed)
?Radiation Oncology         [phone] ?________________________________ ? ?Name: Mariah Schultz MRN: [id number]  ?Date: 01/06/2022  DOB: Apr 18, 1956 ? ?Follow-Up Visit Note ? ?CC: Seward Carol, MD  Michael Boston, MD ? ?  ICD-10-CM   ?1. Squamous Cell Carcinoma of presacral region   C76.3   ?  ? ? ?Diagnosis: Pelvic mass- poorly differentiated high-grade carcinoma -squamous cell carcinoma of unclear etiology ? ?Interval Since Last Radiation:  4 months and 6 days  ? ?Intent: Curative ? ?Radiation Treatment Dates: 07/19/2021 through 09/01/2021 ?Site Technique Total Dose (Gy) Dose per Fx (Gy) Completed Fx Beam Energies  ?Pelvis: Pelvis IMRT 45/45 1.8 25/25 6X  ?Pelvis: Pelvis_Bst IMRT 9/9 1.8 5/5 6X  ? ? ?Narrative:  The patient returns today for routine 3 month follow-up, she was last seen here for follow up on 10/07/21. Since her last visit, the patient followed up with Dr. Lorenso Courier on 10/21/21. During which time, the patient was noted to endorse a strong appetite and a steady increase in energy levels. Overall, the patient denied any pain or any other concerns.        ? ?Imaging performed in the interval includes an MRI of the pelvis on 10/18/2021 which showed a marked response to therapy, demonstrated by a decrease in size of the mass, measuring 3.5 x 5.9, previously 7.2 x 8.4 cm.            ? ?Otherwise, no significant interval history since the patient was last seen.  ? ?She denies any pain within the pelvis area at this time.  She is using her vaginal dilator 1-2 times per week.  She denies any bleeding with this procedure.  She reports the colostomy bag is working well.  She is having less mucus production from the anal rectal area.  She denies any blood in the mucus.            ? ?Allergies:  is allergic to other and pneumococcal vac polyvalent. ? ?Meds: ?Current Outpatient Medications  ?Medication Sig Dispense Refill  ? acetaminophen (TYLENOL) 500 MG tablet Take 500 mg by mouth every 6 (six) hours as  needed.    ? aspirin 81 MG tablet Take 81 mg by mouth daily.    ? gabapentin (NEURONTIN) 100 MG capsule Take 100 mg by mouth 2 (two) times daily.    ? lidocaine-prilocaine (EMLA) cream Apply 1 application topically as needed. 30 g 0  ? Multiple Vitamin (MULTIVITAMIN) tablet Take 1 tablet by mouth daily.    ? Omega-3 Fatty Acids (FISH OIL PO) Take 1 capsule by mouth daily.    ? ondansetron (ZOFRAN) 8 MG tablet Take 1 tablet (8 mg total) by mouth every 8 (eight) hours as needed. 30 tablet 0  ? valsartan-hydrochlorothiazide (DIOVAN-HCT) 160-12.5 MG tablet Take 1 tablet by mouth daily.    ? vitamin C (ASCORBIC ACID) 500 MG tablet Take 500 mg by mouth daily.    ? prochlorperazine (COMPAZINE) 10 MG tablet Take 1 tablet (10 mg total) by mouth every 6 (six) hours as needed for nausea or vomiting. (Patient not taking: Reported on 01/06/2022) 30 tablet 0  ? ?No current facility-administered medications for this encounter.  ? ? ?Physical Findings: ?The patient is in no acute distress. Patient is alert and oriented. ? height is '5\' 4"'$  (1.626 m) and weight is 168 lb 12.8 oz (76.6 kg). Her temperature is 97.6 ?F (36.4 ?C). Her blood pressure is 104/69 and her pulse is 67. Her  respiration is 20 and oxygen saturation is 100%. .  No significant changes. Lungs are clear to auscultation bilaterally. Heart has regular rate and rhythm. No palpable cervical, supraclavicular, or axillary adenopathy. Abdomen soft, non-tender, normal bowel sounds.  Offered to perform a pelvic exam but the patient would like to hold off on this issue. ? ? ?Lab Findings: ?Lab Results  ?Component Value Date  ? WBC 6.0 10/21/2021  ? HGB 9.8 (L) 10/21/2021  ? HCT 29.2 (L) 10/21/2021  ? MCV 97.0 10/21/2021  ? PLT 206 10/21/2021  ? ? ?Radiographic Findings: ?No results found. ? ?Impression:  Pelvic mass- poorly differentiated high-grade carcinoma -squamous cell carcinoma of unclear etiology ? ?She does not exhibit any any aftereffects at this time from her pelvic  radiation therapy.  Pain issues have resolved at this time for which she is relieved.  She was inquiring about whether her colostomy could be reversed and recommended that she discuss this with Dr. Michael Boston.  Anticipate that he would want to have more imaging over the next few months before making this decision. ? ?Plan: Routine follow-up in 3 months.  She will see Dr. Lorenso Courier later this month.  Anticipate that the patient will have another pelvic MRI in the next few months. ? ? ?20 minutes of total time was spent for this patient encounter, including preparation, face-to-face counseling with the patient and coordination of care, physical exam, and documentation of the encounter. ?____________________________________ ? ?Blair Promise, PhD, MD ? ?This document serves as a record of services personally performed by Gery Pray, MD. It was created on his behalf by Roney Mans, a trained medical scribe. The creation of this record is based on the scribe's personal observations and the provider's statements to them. This document has been checked and approved by the attending provider. ? ?

## 2022-01-06 ENCOUNTER — Ambulatory Visit
Admission: RE | Admit: 2022-01-06 | Discharge: 2022-01-06 | Disposition: A | Discharge status: Home / Self Care | Payer: Medicare HMO | Source: Ambulatory Visit | Attending: Radiation Oncology | Admitting: Radiation Oncology

## 2022-01-06 ENCOUNTER — Encounter: Payer: Self-pay | Admitting: Radiation Oncology

## 2022-01-06 ENCOUNTER — Other Ambulatory Visit: Payer: Self-pay

## 2022-01-06 ENCOUNTER — Other Ambulatory Visit: Payer: Self-pay | Admitting: Internal Medicine

## 2022-01-06 VITALS — BP 104/69 | HR 67 | Temp 97.6°F | Resp 20 | Ht 64.0 in | Wt 168.8 lb

## 2022-01-06 DIAGNOSIS — R1909 Other intra-abdominal and pelvic swelling, mass and lump: Secondary | ICD-10-CM | POA: Diagnosis not present

## 2022-01-06 DIAGNOSIS — Z7982 Long term (current) use of aspirin: Secondary | ICD-10-CM | POA: Insufficient documentation

## 2022-01-06 DIAGNOSIS — Z79899 Other long term (current) drug therapy: Secondary | ICD-10-CM | POA: Insufficient documentation

## 2022-01-06 DIAGNOSIS — Z933 Colostomy status: Secondary | ICD-10-CM | POA: Insufficient documentation

## 2022-01-06 DIAGNOSIS — R0989 Other specified symptoms and signs involving the circulatory and respiratory systems: Secondary | ICD-10-CM

## 2022-01-06 DIAGNOSIS — C763 Malignant neoplasm of pelvis: Secondary | ICD-10-CM | POA: Diagnosis not present

## 2022-01-06 NOTE — Progress Notes (Signed)
Mariah Schultz is here today for follow up post radiation to the pelvic. ? ?They completed their radiation on: 09/01/21 ? ?Does the patient complain of any of the following: ? ?Pain: No ?Abdominal bloating: yes ?Diarrhea/Constipation: Patient has colostomy.  ?Nausea/Vomiting: No ?Vaginal Discharge: No ?Blood in Urine or Stool: NO ?Urinary Issues (dysuria/incomplete emptying/ incontinence/ increased frequency/urgency): No ?Does patient report using vaginal dilator 2-3 times a week and/or sexually active 2-3 weeks: Patient using dilator 1-2 times per week.  ?Post radiation skin changes: No ? ? ?Additional comments if applicable: ? Patient reports having tingling in hands and feet. Recently started on Gabapentin.  ? ?Vitals:  ? 01/06/22 0944  ?BP: 104/69  ?Pulse: 67  ?Resp: 20  ?Temp: 97.6 ?F (36.4 ?C)  ?SpO2: 100%  ?Weight: 168 lb 12.8 oz (76.6 kg)  ?Height: '5\' 4"'$  (1.626 m)  ?  ?

## 2022-01-14 ENCOUNTER — Ambulatory Visit
Admission: RE | Admit: 2022-01-14 | Discharge: 2022-01-14 | Disposition: A | Discharge status: Home / Self Care | Payer: Medicare HMO | Source: Ambulatory Visit | Attending: Internal Medicine | Admitting: Internal Medicine

## 2022-01-14 DIAGNOSIS — R0989 Other specified symptoms and signs involving the circulatory and respiratory systems: Secondary | ICD-10-CM

## 2022-01-19 ENCOUNTER — Inpatient Hospital Stay: Payer: Medicare HMO | Attending: Hematology and Oncology | Admitting: Hematology and Oncology

## 2022-01-19 ENCOUNTER — Inpatient Hospital Stay: Payer: Medicare HMO

## 2022-01-19 ENCOUNTER — Other Ambulatory Visit: Payer: Self-pay

## 2022-01-19 VITALS — BP 113/87 | HR 69 | Temp 97.5°F | Resp 20 | Wt 167.2 lb

## 2022-01-19 DIAGNOSIS — R918 Other nonspecific abnormal finding of lung field: Secondary | ICD-10-CM | POA: Insufficient documentation

## 2022-01-19 DIAGNOSIS — Z79899 Other long term (current) drug therapy: Secondary | ICD-10-CM | POA: Diagnosis not present

## 2022-01-19 DIAGNOSIS — C19 Malignant neoplasm of rectosigmoid junction: Secondary | ICD-10-CM | POA: Diagnosis not present

## 2022-01-19 DIAGNOSIS — G629 Polyneuropathy, unspecified: Secondary | ICD-10-CM | POA: Diagnosis not present

## 2022-01-19 DIAGNOSIS — Z8249 Family history of ischemic heart disease and other diseases of the circulatory system: Secondary | ICD-10-CM | POA: Insufficient documentation

## 2022-01-19 DIAGNOSIS — R140 Abdominal distension (gaseous): Secondary | ICD-10-CM | POA: Diagnosis not present

## 2022-01-19 DIAGNOSIS — Z95828 Presence of other vascular implants and grafts: Secondary | ICD-10-CM

## 2022-01-19 DIAGNOSIS — C763 Malignant neoplasm of pelvis: Secondary | ICD-10-CM

## 2022-01-19 DIAGNOSIS — F17210 Nicotine dependence, cigarettes, uncomplicated: Secondary | ICD-10-CM | POA: Insufficient documentation

## 2022-01-19 LAB — CBC WITH DIFFERENTIAL (CANCER CENTER ONLY)
Abs Immature Granulocytes: 0.01 10*3/uL (ref 0.00–0.07)
Basophils Absolute: 0 10*3/uL (ref 0.0–0.1)
Basophils Relative: 0 %
Eosinophils Absolute: 0.1 10*3/uL (ref 0.0–0.5)
Eosinophils Relative: 2 %
HCT: 35.8 % — ABNORMAL LOW (ref 36.0–46.0)
Hemoglobin: 12.3 g/dL (ref 12.0–15.0)
Immature Granulocytes: 0 %
Lymphocytes Relative: 10 %
Lymphs Abs: 0.6 10*3/uL — ABNORMAL LOW (ref 0.7–4.0)
MCH: 32.5 pg (ref 26.0–34.0)
MCHC: 34.4 g/dL (ref 30.0–36.0)
MCV: 94.5 fL (ref 80.0–100.0)
Monocytes Absolute: 0.5 10*3/uL (ref 0.1–1.0)
Monocytes Relative: 8 %
Neutro Abs: 4.6 10*3/uL (ref 1.7–7.7)
Neutrophils Relative %: 80 %
Platelet Count: 255 10*3/uL (ref 150–400)
RBC: 3.79 MIL/uL — ABNORMAL LOW (ref 3.87–5.11)
RDW: 13.8 % (ref 11.5–15.5)
WBC Count: 5.7 10*3/uL (ref 4.0–10.5)
nRBC: 0 % (ref 0.0–0.2)

## 2022-01-19 LAB — CMP (CANCER CENTER ONLY)
ALT: 9 U/L (ref 0–44)
AST: 10 U/L — ABNORMAL LOW (ref 15–41)
Albumin: 3.7 g/dL (ref 3.5–5.0)
Alkaline Phosphatase: 114 U/L (ref 38–126)
Anion gap: 6 (ref 5–15)
BUN: 28 mg/dL — ABNORMAL HIGH (ref 8–23)
CO2: 28 mmol/L (ref 22–32)
Calcium: 9.2 mg/dL (ref 8.9–10.3)
Chloride: 106 mmol/L (ref 98–111)
Creatinine: 1.25 mg/dL — ABNORMAL HIGH (ref 0.44–1.00)
GFR, Estimated: 47 mL/min — ABNORMAL LOW (ref >60)
Glucose, Bld: 115 mg/dL — ABNORMAL HIGH (ref 70–99)
Potassium: 3.9 mmol/L (ref 3.5–5.1)
Sodium: 140 mmol/L (ref 135–145)
Total Bilirubin: 0.3 mg/dL (ref 0.3–1.2)
Total Protein: 7.2 g/dL (ref 6.5–8.1)

## 2022-01-19 MED ORDER — SODIUM CHLORIDE 0.9% FLUSH
10.0000 mL | Freq: Once | INTRAVENOUS | Status: AC
  Administered 2022-01-19: 10 mL

## 2022-01-19 MED ORDER — HEPARIN SOD (PORK) LOCK FLUSH 100 UNIT/ML IV SOLN
500.0000 [IU] | Freq: Once | INTRAVENOUS | Status: AC
  Administered 2022-01-19: 500 [IU]

## 2022-01-19 NOTE — Progress Notes (Signed)
?[place] ?Telephone:(336) [phone]   [phone] ? ?PROGRESS NOTE ? ?Patient Care Team: ?Seward Carol, MD as PCP - General (Internal Medicine) ?Orson Slick, MD as Consulting Physician (Hematology and Oncology) ?Otis Brace, MD as Consulting Physician (Gastroenterology) ?Michael Boston, MD as Consulting Physician (Colon and Rectal Surgery) ?Lafonda Mosses, MD as Consulting Physician (Gynecologic Oncology) ?Gery Pray, MD as Consulting Physician (Radiation Oncology) ? 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 Findings showed a 7.5 x 6.9 cm heterogeneously enhancing mass is noted posteriorly in ?the pelvis anterior to the sacrum which is highly concerning for ?colorectal malignancy with possible adjacent metastatic adenopathy. ?05/24/2021: colonoscopy performed by [facility]. Biopsy showed poorly differentiated high grade carcinoma, differential including tuboovarian and peritoneal. Immunophemotype not consistent with colorectal adenocarcinoma.  ?06/04/2021: PET CT scan performed, findings show hypermetabolic mass adjacent to the rectum, likely due to colorectal primary neoplasm and ill-defined hypermetabolic nodule is seen more inferiorly in the ?left perirectal fat, likely a lymph node metastasis. She was also noted to have Numerous small bilateral small solid pulmonary nodules which are below resolution for PET-CT ?06/11/2021: establish care with Dr. Lorenso Courier  ?06/22/2021: Laparoscopic diagnostic procedure with diverting loop colostomy and anorectal exam under anesthesia.  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 ?07/19/2021: Start of Radiation therapy.  ?07/20/2021: Week 1 of Cisplatin '40mg'$ /m2 ?07/27/2021: Week 2 of Cisplatin '40mg'$ /m2 ?08/02/2021: Week 3 of  Cisplatin '40mg'$ /m2 ?08/09/2021: Week 4 of Cisplatin '40mg'$ /m2 ?08/17/2021: Week 5 of Cisplatin '40mg'$ /m2 ?08/24/2021: Week 6 of Cisplatin '40mg'$ /m2 ?08/31/2021:  Week 7 of Cisplatin '40mg'$ /m2 ?09/01/2021: end of chemoradiation.  ?10/16/2021: MRI Pelvis showed response to therapy of the soft tissue mass centered about the right-side of the rectosigmoid junction ? ?Interval History:  ?Mariah Schultz 66 y.o. female with medical history significant for squamous cell cancer of unclear etiology who presents for a follow up visit. The patient's last visit was on 10/21/2021. In the interim since the last visit she has had no major changes in her health. ? ?On exam today [name] reports continues to have difficulty with neuropathy.  It predominantly affects her feet bilaterally with some tingling in her left hand.  She reports that some days are better than others but that it never "goes away entirely".  She notes that she is currently undergoing evaluation for this including vascular and neurology work-up.  She reports she has an upcoming nerve study on 02/22/2022.  She notes that she also has tinnitus in her ears.  Her bowels have been "up-and-down" and she takes MiraLAX every other day.  She has had an increase in weight and is up to 167 pounds from 158.  She is eager to have her ostomy reversed though the ostomy is not causing any trouble at this time.  She denies fevers, chills, night sweats, shortness of breath, chest pain, cough, peripheral edema or neuropathy.   A full 10 point ROS is listed below. ? ? ?MEDICAL HISTORY:  ?Past Medical History:  ?Diagnosis Date  ? CAP (community acquired pneumonia) 09/10/2013  ? History of radiation therapy   ? pelvis 07/19/2021-09/01/2021  Dr Gery Pray  ? Hypertension   ? Papilloma of left breast   ? 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 Perforated appendicitis 03/19/2013  ? ? ?SURGICAL HISTORY: ?Past Surgical History:  ?Procedure Laterality Date  ? ABDOMINAL HYSTERECTOMY    ?  BREAST  EXCISIONAL BIOPSY Left 02/26/2019  ? B9 Biopsy  ? BREAST LUMPECTOMY WITH RADIOACTIVE SEED LOCALIZATION Left 02/26/2019  ? Procedure: LEFT BREAST LUMPECTOMY WITH RADIOACTIVE SEED LOCALIZATION;  Surgeon: Fanny Skates, MD;  Location: [place];  Service: General;  Laterality: Left;  ? IR IMAGING GUIDED PORT INSERTION  07/21/2021  ? LAPAROSCOPIC APPENDECTOMY N/A 02/25/2013  ? Procedure: APPENDECTOMY LAPAROSCOPIC;  Surgeon: Harl Bowie, MD;  Location: [place];  Service: General;  Laterality: N/A;  ? LAPAROSCOPY N/A 06/22/2021  ? Procedure: LAPAROSCOPY DIAGNOSTIC, DIVERTING LOOP COLOSTOMY, ANORECTAL EXAM UNDER ANESTHESIA, TRANSRECTAL CORE BIOPSIES.;  Surgeon: Lafonda Mosses, MD;  Location: [facility];  Service: Gynecology;  Laterality: N/A;  ? ? ?SOCIAL HISTORY: ?Social History  ? ?Socioeconomic History  ? Marital status: Married  ?  Spouse name: Not on file  ? Number of children: Not on file  ? Years of education: Not on file  ? Highest education level: Not on file  ?Occupational History  ? Not on file  ?Tobacco Use  ? Smoking status: Every Day  ?  Packs/day: 1.00  ?  Years: 40.00  ?  Pack years: 40.00  ?  Types: Cigarettes  ? Smokeless tobacco: Never  ?Vaping Use  ? Vaping Use: Never used  ?Substance and Sexual Activity  ? Alcohol use: Yes  ?  Comment: occ  ? Drug use: No  ? Sexual activity: Not Currently  ?  Birth control/protection: Surgical  ?Other Topics Concern  ? Not on file  ?Social History Narrative  ? Not on file  ? ?Social Determinants of Health  ? ?Financial Resource Strain: Low Risk   ? Difficulty of Paying Living Expenses: Not very hard  ?Food Insecurity: No Food Insecurity  ? Worried About Charity fundraiser in the Last Year: Never true  ? Ran Out of Food in the Last Year: Never true  ?Transportation Needs: No Transportation Needs  ? Lack of Transportation (Medical): No  ? Lack of Transportation (Non-Medical): No  ?Physical Activity: Not on file  ?Stress: Not on file  ?Social  Connections: Not on file  ?Intimate Partner Violence: Not on file  ? ? ?FAMILY HISTORY: ?Family History  ?Problem Relation Age of Onset  ? Heart disease Father   ? Colon cancer Neg Hx   ? Breast cancer Neg Hx   ? Ovarian cancer Neg Hx   ? Pancreatic cancer Neg Hx   ? Prostate cancer Neg Hx   ? Endometrial cancer Neg Hx   ?     Patient unaware  ? ? ?ALLERGIES:  is allergic to other and pneumococcal vac polyvalent. ? ?MEDICATIONS:  ?Current Outpatient Medications  ?Medication Sig Dispense Refill  ? acetaminophen (TYLENOL) 500 MG tablet Take 500 mg by mouth every 6 (six) hours as needed.    ? aspirin 81 MG tablet Take 81 mg by mouth daily.    ? gabapentin (NEURONTIN) 100 MG capsule Take 100 mg by mouth 2 (two) times daily.    ? lidocaine-prilocaine (EMLA) cream Apply 1 application topically as needed. 30 g 0  ? Multiple Vitamin (MULTIVITAMIN) tablet Take 1 tablet by mouth daily.    ? Omega-3 Fatty Acids (FISH OIL PO) Take 1 capsule by mouth daily.    ? ondansetron (ZOFRAN) 8 MG tablet Take 1 tablet (8 mg total) by mouth every 8 (eight) hours as needed. 30 tablet 0  ? prochlorperazine (COMPAZINE) 10 MG tablet Take 1 tablet (10 mg total) by mouth every 6 (  six) hours as needed for nausea or vomiting. (Patient not taking: Reported on 01/06/2022) 30 tablet 0  ? valsartan-hydrochlorothiazide (DIOVAN-HCT) 160-12.5 MG tablet Take 1 tablet by mouth daily.    ? vitamin C (ASCORBIC ACID) 500 MG tablet Take 500 mg by mouth daily.    ? ?No current facility-administered medications for this visit.  ? ? ?REVIEW OF SYSTEMS:   ?Constitutional: ( - ) fevers, ( - )  chills , ( - ) night sweats ?Eyes: ( - ) blurriness of vision, ( - ) double vision, ( - ) watery eyes ?Ears, nose, mouth, throat, and face: ( - ) mucositis, ( - ) sore throat ?Respiratory: ( - ) cough, ( - ) dyspnea, ( - ) wheezes ?Cardiovascular: ( - ) palpitation, ( - ) chest discomfort, ( - ) lower extremity swelling ?Gastrointestinal:  ( - ) nausea, ( - ) heartburn, ( - )  change in bowel habits ?Skin: ( - ) abnormal skin rashes ?Lymphatics: ( - ) new lymphadenopathy, ( - ) easy bruising ?Neurological: ( - ) numbness, ( - ) tingling, ( - ) new weaknesses ?Behavioral/Psych: ( - )

## 2022-01-20 ENCOUNTER — Telehealth: Payer: Self-pay | Admitting: Hematology and Oncology

## 2022-01-20 NOTE — Telephone Encounter (Signed)
Per 5/17 los called and spoke to pt about appointments.  Pt confirmed appointments

## 2022-01-24 DIAGNOSIS — G629 Polyneuropathy, unspecified: Secondary | ICD-10-CM | POA: Diagnosis not present

## 2022-02-01 ENCOUNTER — Ambulatory Visit (HOSPITAL_COMMUNITY)
Admission: RE | Admit: 2022-02-01 | Discharge: 2022-02-01 | Disposition: A | Discharge status: Home / Self Care | Payer: Medicare HMO | Source: Ambulatory Visit | Attending: Hematology and Oncology | Admitting: Hematology and Oncology

## 2022-02-01 DIAGNOSIS — C44529 Squamous cell carcinoma of skin of other part of trunk: Secondary | ICD-10-CM | POA: Diagnosis not present

## 2022-02-01 DIAGNOSIS — K4020 Bilateral inguinal hernia, without obstruction or gangrene, not specified as recurrent: Secondary | ICD-10-CM | POA: Diagnosis not present

## 2022-02-01 DIAGNOSIS — C763 Malignant neoplasm of pelvis: Secondary | ICD-10-CM | POA: Insufficient documentation

## 2022-02-01 DIAGNOSIS — K5730 Diverticulosis of large intestine without perforation or abscess without bleeding: Secondary | ICD-10-CM | POA: Diagnosis not present

## 2022-02-01 DIAGNOSIS — K6289 Other specified diseases of anus and rectum: Secondary | ICD-10-CM | POA: Diagnosis not present

## 2022-02-11 ENCOUNTER — Telehealth: Payer: Self-pay | Admitting: *Deleted

## 2022-02-11 NOTE — Telephone Encounter (Signed)
Received vm message from pt asking for an appt with Dr. Lorenso Courier to review recent MRI scan with her. TCT patient and advised that we can see her on 02/15/22 @ 9am for labs and Dr. Lorenso Courier @ 9:30 am  Pt is very concerned about recent (02/03/22)  scan results and is having increased pain in her abdomen.

## 2022-02-15 ENCOUNTER — Other Ambulatory Visit: Payer: Self-pay

## 2022-02-15 ENCOUNTER — Inpatient Hospital Stay: Payer: Medicare HMO | Attending: Hematology and Oncology

## 2022-02-15 ENCOUNTER — Inpatient Hospital Stay (HOSPITAL_BASED_OUTPATIENT_CLINIC_OR_DEPARTMENT_OTHER): Payer: Medicare HMO | Admitting: Hematology and Oncology

## 2022-02-15 ENCOUNTER — Other Ambulatory Visit: Payer: Self-pay | Admitting: Lab

## 2022-02-15 VITALS — BP 130/84 | HR 76 | Temp 97.8°F | Resp 18 | Ht 64.0 in | Wt 166.2 lb

## 2022-02-15 DIAGNOSIS — Z79899 Other long term (current) drug therapy: Secondary | ICD-10-CM | POA: Insufficient documentation

## 2022-02-15 DIAGNOSIS — R918 Other nonspecific abnormal finding of lung field: Secondary | ICD-10-CM | POA: Diagnosis not present

## 2022-02-15 DIAGNOSIS — Z8249 Family history of ischemic heart disease and other diseases of the circulatory system: Secondary | ICD-10-CM | POA: Insufficient documentation

## 2022-02-15 DIAGNOSIS — K6289 Other specified diseases of anus and rectum: Secondary | ICD-10-CM | POA: Diagnosis not present

## 2022-02-15 DIAGNOSIS — C763 Malignant neoplasm of pelvis: Secondary | ICD-10-CM

## 2022-02-15 DIAGNOSIS — K5900 Constipation, unspecified: Secondary | ICD-10-CM | POA: Insufficient documentation

## 2022-02-15 DIAGNOSIS — R5383 Other fatigue: Secondary | ICD-10-CM | POA: Diagnosis not present

## 2022-02-15 DIAGNOSIS — F17210 Nicotine dependence, cigarettes, uncomplicated: Secondary | ICD-10-CM | POA: Diagnosis not present

## 2022-02-15 DIAGNOSIS — C19 Malignant neoplasm of rectosigmoid junction: Secondary | ICD-10-CM | POA: Insufficient documentation

## 2022-02-15 DIAGNOSIS — G629 Polyneuropathy, unspecified: Secondary | ICD-10-CM | POA: Insufficient documentation

## 2022-02-15 LAB — CBC WITH DIFFERENTIAL (CANCER CENTER ONLY)
Abs Immature Granulocytes: 0.02 10*3/uL (ref 0.00–0.07)
Basophils Absolute: 0 10*3/uL (ref 0.0–0.1)
Basophils Relative: 0 %
Eosinophils Absolute: 0.2 10*3/uL (ref 0.0–0.5)
Eosinophils Relative: 2 %
HCT: 35.4 % — ABNORMAL LOW (ref 36.0–46.0)
Hemoglobin: 12.1 g/dL (ref 12.0–15.0)
Immature Granulocytes: 0 %
Lymphocytes Relative: 7 %
Lymphs Abs: 0.5 10*3/uL — ABNORMAL LOW (ref 0.7–4.0)
MCH: 31.5 pg (ref 26.0–34.0)
MCHC: 34.2 g/dL (ref 30.0–36.0)
MCV: 92.2 fL (ref 80.0–100.0)
Monocytes Absolute: 0.6 10*3/uL (ref 0.1–1.0)
Monocytes Relative: 8 %
Neutro Abs: 6.4 10*3/uL (ref 1.7–7.7)
Neutrophils Relative %: 83 %
Platelet Count: 298 10*3/uL (ref 150–400)
RBC: 3.84 MIL/uL — ABNORMAL LOW (ref 3.87–5.11)
RDW: 13.6 % (ref 11.5–15.5)
WBC Count: 7.7 10*3/uL (ref 4.0–10.5)
nRBC: 0 % (ref 0.0–0.2)

## 2022-02-15 LAB — CMP (CANCER CENTER ONLY)
ALT: 9 U/L (ref 0–44)
AST: 11 U/L — ABNORMAL LOW (ref 15–41)
Albumin: 3.9 g/dL (ref 3.5–5.0)
Alkaline Phosphatase: 98 U/L (ref 38–126)
Anion gap: 8 (ref 5–15)
BUN: 50 mg/dL — ABNORMAL HIGH (ref 8–23)
CO2: 27 mmol/L (ref 22–32)
Calcium: 9.7 mg/dL (ref 8.9–10.3)
Chloride: 100 mmol/L (ref 98–111)
Creatinine: 1.94 mg/dL — ABNORMAL HIGH (ref 0.44–1.00)
GFR, Estimated: 28 mL/min — ABNORMAL LOW (ref >60)
Glucose, Bld: 117 mg/dL — ABNORMAL HIGH (ref 70–99)
Potassium: 4.4 mmol/L (ref 3.5–5.1)
Sodium: 135 mmol/L (ref 135–145)
Total Bilirubin: 0.3 mg/dL (ref 0.3–1.2)
Total Protein: 7.9 g/dL (ref 6.5–8.1)

## 2022-02-15 NOTE — Progress Notes (Signed)
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 Findings showed a 7.5 x 6.9 cm heterogeneously enhancing mass is noted posteriorly in the pelvis anterior to the sacrum which is highly concerning for colorectal malignancy with possible adjacent metastatic adenopathy. 05/24/2021: colonoscopy performed by [facility]. Biopsy showed poorly differentiated high grade carcinoma, differential including tuboovarian and peritoneal. Immunophemotype not consistent with colorectal adenocarcinoma.  06/04/2021: PET CT scan performed, findings show hypermetabolic mass adjacent to the rectum, likely due to colorectal primary neoplasm and ill-defined hypermetabolic nodule is seen more inferiorly in the left perirectal fat, likely a lymph node metastasis. She was also noted to have Numerous small bilateral small solid pulmonary nodules which are below resolution for PET-CT 06/11/2021: establish care with Dr. Lorenso Courier  06/22/2021: Laparoscopic diagnostic procedure with diverting loop colostomy and anorectal exam under anesthesia.  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 07/19/2021: Start of Radiation therapy.  07/20/2021: Week 1 of Cisplatin '40mg'$ /m2 07/27/2021: Week 2 of Cisplatin '40mg'$ /m2 08/02/2021: Week 3 of  Cisplatin '40mg'$ /m2 08/09/2021: Week 4 of Cisplatin '40mg'$ /m2 08/17/2021: Week 5 of Cisplatin '40mg'$ /m2 08/24/2021: Week 6 of Cisplatin '40mg'$ /m2 08/31/2021:  Week 7 of Cisplatin '40mg'$ /m2 09/01/2021: end of chemoradiation.  10/16/2021: MRI Pelvis showed response to therapy of the soft tissue mass centered about the right-side of the rectosigmoid junction 02/01/2022: MRI Pelvis showed mild enlargement of the mass measuring 6.0 x 4.4 cm on 13/3 versus 5.9 x 3.5 cm on the prior exam.   Interval History:  Mariah Schultz 66 y.o. female with medical history significant for squamous cell cancer of unclear etiology who presents for a follow up visit. The patient's last visit was on 01/19/2022. In the interim since the last visit she had an MRI of the pelvis which showed mild enlargement of the mass.  On exam today Mariah Schultz reports has been having some new symptoms in the interim since her last visit.  She has been having issues with fatigue and pain.  She reports the pain is different than it was previously.  She reports it is more a lower stomach and crampy.  Is similar to when a menstrual cycle was first coming on.  She reports she has also been more constipated lately and has been trying to take MiraLAX unblock this.  She has been taking Tylenol for the pain.  Her appetite is been poor and she has not been eating particularly well.  Fortunately she is not having any nausea or vomiting.  She notes that her ostomy output has been light.  She denies fevers, chills, night sweats, shortness of breath, chest pain, cough, peripheral edema or neuropathy.   A full 10 point ROS is listed below.   MEDICAL HISTORY:  Past Medical History:  Diagnosis Date   CAP (community acquired pneumonia) 09/10/2013   History of radiation therapy    pelvis 07/19/2021-09/01/2021  Dr Gery Pray   Hypertension    Papilloma of left breast  Papilloma of left breast 02/26/2019   Perforated appendicitis 03/19/2013    SURGICAL  HISTORY: Past Surgical History:  Procedure Laterality Date   ABDOMINAL HYSTERECTOMY     BREAST EXCISIONAL BIOPSY Left 02/26/2019   B9 Biopsy   BREAST LUMPECTOMY WITH RADIOACTIVE SEED LOCALIZATION Left 02/26/2019   Procedure: LEFT BREAST LUMPECTOMY WITH RADIOACTIVE SEED LOCALIZATION;  Surgeon: Fanny Skates, MD;  Location: [facility];  Service: General;  Laterality: Left;   IR IMAGING GUIDED PORT INSERTION  07/21/2021   LAPAROSCOPIC APPENDECTOMY N/A 02/25/2013   Procedure: APPENDECTOMY LAPAROSCOPIC;  Surgeon: Harl Bowie, MD;  Location: [facility];  Service: General;  Laterality: N/A;   LAPAROSCOPY N/A 06/22/2021   Procedure: LAPAROSCOPY DIAGNOSTIC, DIVERTING LOOP COLOSTOMY, ANORECTAL EXAM UNDER ANESTHESIA, TRANSRECTAL CORE BIOPSIES.;  Surgeon: Lafonda Mosses, MD;  Location: [facility];  Service: Gynecology;  Laterality: N/A;    SOCIAL HISTORY: Social History   Socioeconomic History   Marital status: Married    Spouse name: Not on file   Number of children: Not on file   Years of education: Not on file   Highest education level: Not on file  Occupational History   Not on file  Tobacco Use   Smoking status: Every Day    Packs/day: 1.00    Years: 40.00    Total pack years: 40.00    Types: Cigarettes   Smokeless tobacco: Never  Vaping Use   Vaping Use: Never used  Substance and Sexual Activity   Alcohol use: Yes    Comment: occ   Drug use: No   Sexual activity: Not Currently    Birth control/protection: Surgical  Other Topics Concern   Not on file  Social History Narrative   Not on file   Social Determinants of Health   Financial Resource Strain: Low Risk  (06/16/2021)   Overall Financial Resource Strain (CARDIA)    Difficulty of Paying Living Expenses: Not very hard  Food Insecurity: No Food Insecurity (06/16/2021)   Hunger Vital Sign    Worried About Running Out of Food in the Last Year: Never true    Ran Out of Food in the Last Year: Never true   Transportation Needs: No Transportation Needs (06/16/2021)   PRAPARE - Hydrologist (Medical): No    Lack of Transportation (Non-Medical): No  Physical Activity: Not on file  Stress: Not on file  Social Connections: Not on file  Intimate Partner Violence: Not on file    FAMILY HISTORY: Family History  Problem Relation Age of Onset   Heart disease Father    Colon cancer Neg Hx    Breast cancer Neg Hx    Ovarian cancer Neg Hx    Pancreatic cancer Neg Hx    Prostate cancer Neg Hx    Endometrial cancer Neg Hx        Patient unaware    ALLERGIES:  is allergic to other and pneumococcal vac polyvalent.  MEDICATIONS:  Current Outpatient Medications  Medication Sig Dispense Refill   acetaminophen (TYLENOL) 500 MG tablet Take 500 mg by mouth every 6 (six) hours as needed.     aspirin 81 MG tablet Take 81 mg by mouth daily.     gabapentin (NEURONTIN) 100 MG capsule Take 100 mg by mouth 2 (two) times daily.     lidocaine-prilocaine (EMLA) cream Apply 1 application topically as needed. 30 g 0   Multiple Vitamin (MULTIVITAMIN) tablet Take 1 tablet by mouth daily.  Omega-3 Fatty Acids (FISH OIL PO) Take 1 capsule by mouth daily.     Polyethylene Glycol 3350 (MIRALAX PO) Take by mouth as needed.     valsartan-hydrochlorothiazide (DIOVAN-HCT) 160-12.5 MG tablet Take 1 tablet by mouth daily.     vitamin C (ASCORBIC ACID) 500 MG tablet Take 500 mg by mouth daily.     ondansetron (ZOFRAN) 8 MG tablet Take 1 tablet (8 mg total) by mouth every 8 (eight) hours as needed. (Patient not taking: Reported on 02/15/2022) 30 tablet 0   prochlorperazine (COMPAZINE) 10 MG tablet Take 1 tablet (10 mg total) by mouth every 6 (six) hours as needed for nausea or vomiting. (Patient not taking: Reported on 02/15/2022) 30 tablet 0   No current facility-administered medications for this visit.    REVIEW OF SYSTEMS:   Constitutional: ( - ) fevers, ( - )  chills , ( - ) night  sweats Eyes: ( - ) blurriness of vision, ( - ) double vision, ( - ) watery eyes Ears, nose, mouth, throat, and face: ( - ) mucositis, ( - ) sore throat Respiratory: ( - ) cough, ( - ) dyspnea, ( - ) wheezes Cardiovascular: ( - ) palpitation, ( - ) chest discomfort, ( - ) lower extremity swelling Gastrointestinal:  ( - ) nausea, ( - ) heartburn, ( - ) change in bowel habits Skin: ( - ) abnormal skin rashes Lymphatics: ( - ) new lymphadenopathy, ( - ) easy bruising Neurological: ( - ) numbness, ( - ) tingling, ( - ) new weaknesses Behavioral/Psych: ( - ) mood change, ( - ) new changes  All other systems were reviewed with the patient and are negative.  PHYSICAL EXAMINATION: ECOG PERFORMANCE STATUS: 1 - Symptomatic but completely ambulatory  Vitals:   02/15/22 0923  BP: 130/84  Pulse: 76  Resp: 18  Temp: 97.8 F (36.6 C)  SpO2: 97%      Filed Weights   02/15/22 0923  Weight: 166 lb 3.2 oz (75.4 kg)     GENERAL: Well-appearing middle-aged African-American female, alert, no distress and comfortable SKIN: skin color, texture, turgor are normal, no rashes or significant lesions EYES: conjunctiva are pink and non-injected, sclera clear LUNGS: clear to auscultation and percussion with normal breathing effort HEART: regular rate & rhythm and no murmurs and no lower extremity edema ABDOMEN: Ostomy in place producing soft stool.  Soft, non-tender, non-distended, normal bowel sounds Musculoskeletal: no cyanosis of digits and no clubbing  PSYCH: alert & oriented x 3, fluent speech NEURO: no focal motor/sensory deficits  LABORATORY DATA:  I have reviewed the data as listed    Latest Ref Rng & Units 02/15/2022    9:02 AM 01/19/2022   11:11 AM 10/21/2021   10:48 AM  CBC  WBC 4.0 - 10.5 K/uL 7.7  5.7  6.0   Hemoglobin 12.0 - 15.0 g/dL 12.1  12.3  9.8   Hematocrit 36.0 - 46.0 % 35.4  35.8  29.2   Platelets 150 - 400 K/uL 298  255  206        Latest Ref Rng & Units 02/15/2022     9:02 AM 01/19/2022   11:11 AM 10/21/2021   10:48 AM  CMP  Glucose 70 - 99 mg/dL 117  115  115   BUN 8 - 23 mg/dL 50  28  30   Creatinine 0.44 - 1.00 mg/dL 1.94  1.25  1.08   Sodium 135 - 145 mmol/L 135  140  142   Potassium 3.5 - 5.1 mmol/L 4.4  3.9  3.8   Chloride 98 - 111 mmol/L 100  106  106   CO2 22 - 32 mmol/L '27  28  29   '$ Calcium 8.9 - 10.3 mg/dL 9.7  9.2  9.3   Total Protein 6.5 - 8.1 g/dL 7.9  7.2  7.2   Total Bilirubin 0.3 - 1.2 mg/dL 0.3  0.3  0.3   Alkaline Phos 38 - 126 U/L 98  114  109   AST 15 - 41 U/L '11  10  11   '$ ALT 0 - 44 U/L '9  9  9     '$ RADIOGRAPHIC STUDIES: MR Pelvis Wo Contrast  Result Date: 02/03/2022 CLINICAL DATA:  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 Poorly differentiated high-grade carcinoma of unclear origin. EXAM: MRI PELVIS WITHOUT CONTRAST TECHNIQUE: Multiplanar multisequence MR imaging of the pelvis was performed. No intravenous contrast was administered. COMPARISON:  10/16/2021 FINDINGS: Urinary Tract:  Normal urinary bladder. Bowel:  No bowel obstruction.  Extensive colonic diverticulosis. The heterogeneous mass centered to the right of the rectosigmoid junction measures 6.0 x 4.4 cm on 13/3 versus 5.9 x 3.5 cm on the prior exam. 4.7 cm craniocaudal on coronal image 20/4 today. 4.2 cm when measured in a similar fashion on the prior exam. Vascular/Lymphatic: No pelvic aneurysm. No residual pelvic adenopathy. Reproductive:  Hysterectomy.  No adnexal mass. Other: New trace perirectal fluid including on 16/3. Tiny fat containing bilateral inguinal hernias. Musculoskeletal: No acute osseous abnormality. IMPRESSION: 1. Mild enlargement of the mass centered about the right-side of the rectosigmoid junction. No bowel obstruction or other acute complication. 2. No residual or recurrent pelvic adenopathy. 3. New trace perirectal fluid. Electronically Signed   By: Abigail Miyamoto M.D.   On: 02/03/2022 08:23    ASSESSMENT & PLAN Sabirin Baray 66 y.o.  female with medical history significant for squamous cell cancer of unclear etiology who presents for a follow up visit.   Previously we discussed the findings on pathology and the steps moving forward.  At this time this appears to be a squamous cell carcinoma of unclear etiology.  Based on the radiographic review and the surgical procedure there is no clear etiology for this patient's cancer.  The patient underwent a loop colostomy with biopsy of the lesion.  At this time I recommend that we pursue chemoradiation with cisplatin 40 mg per metered squared and combination with radiation therapy.  We will continue cisplatin for the duration of radiation.  The patient voiced understanding of this plan moving forward.  We started chemotherapy on 07/20/2021 with concurrent radiation.  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 Completed therapy on 09/01/2021.  --MRI on 10/18/2021 showed marked response to therapy, with the mass decreasing in size to 3.5 x 5.9 from 7.2 x 8.4 cm.  --MRIN on 02/01/2022 mass increased modestly to 6.0 x 4.4 cm  Plan: --labs at each clinic visit with CMP and CBC --MRI to be repeated prior to next visit in August 2023 --RTC in 12 weeks to re-evaluate    #  Neuropathy --currently undergoing evaluation with neurological/vascular workup. --etiology is unclear, not likely related to chemotherapy as it began afterwards.   #Pain Control -- Recommend continuing Tylenol  for current abdominal pain.  Recommend 1000 g every 8 hours or 6 and a 50 mg every 6 hours.  --Xtampza 13.'5mg'$  q12H  for long act pain control (not being used) --Oxycodone 5-'10mg'$  q6H as needed.  --dilaudid 2 mg q6H PRN for breakthrough(not being used)  #Supportive Care -- chemotherapy education complete -- port placed -- zofran '8mg'$  q8H PRN and compazine '10mg'$  PO q6H for nausea -- EMLA cream for port -- pain medication as above  No orders of the defined types were placed in this encounter.   All questions were answered. The patient knows to call the clinic with any problems, questions or concerns.  I have spent a total of 30 minutes minutes of face-to-face and non-face-to-face time, preparing to see the patient, performing a medically appropriate examination, counseling and educating the patient, ordering medications documenting clinical information in the electronic health record, and care coordination.   Ledell Peoples, MD Department of Hematology/Oncology [place] at [facility] Phone: [phone] Pager: [phone] Email: [email or web address]  02/15/2022 10:22 AM

## 2022-02-21 DIAGNOSIS — G629 Polyneuropathy, unspecified: Secondary | ICD-10-CM | POA: Diagnosis not present

## 2022-02-22 DIAGNOSIS — G629 Polyneuropathy, unspecified: Secondary | ICD-10-CM | POA: Diagnosis not present

## 2022-02-22 DIAGNOSIS — G5603 Carpal tunnel syndrome, bilateral upper limbs: Secondary | ICD-10-CM | POA: Diagnosis not present

## 2022-02-28 ENCOUNTER — Telehealth: Payer: Self-pay | Admitting: *Deleted

## 2022-02-28 NOTE — Telephone Encounter (Signed)
Received call from pt requesting refill of her oxycodone.  Noted that it was not on her medication list. TCT regarding this request.  Spoke with her about this. She states she had not taken this for awhile. She states that she is now having pain in her abdomen and her back now and then and this oxycodone helps the most. She had it last filled in 08/2021 She had a few leftover but now needs a refill.

## 2022-03-01 ENCOUNTER — Other Ambulatory Visit: Payer: Self-pay | Admitting: Hematology and Oncology

## 2022-03-01 MED ORDER — OXYCODONE HCL 5 MG PO TABS: 5.0000 mg | ORAL_TABLET | ORAL | 0 refills | Status: DC | PRN

## 2022-03-11 ENCOUNTER — Telehealth: Payer: Self-pay | Admitting: *Deleted

## 2022-03-11 NOTE — Telephone Encounter (Signed)
Received vm message from pt requesting refill of her Oxycodone. Last filled on 03/01/22 for # 30 at [facility] in [facility]

## 2022-03-12 ENCOUNTER — Other Ambulatory Visit: Payer: Self-pay | Admitting: Hematology and Oncology

## 2022-03-12 MED ORDER — OXYCODONE HCL 5 MG PO TABS: 5.0000 mg | ORAL_TABLET | ORAL | 0 refills | Status: DC | PRN

## 2022-03-12 NOTE — Progress Notes (Signed)
Patient called saying she ran out of pain medication. She called Friday morning and she said no one called back. She has baseline lower abdominal pain and back pain from the cancer. No change in pain No associated fever, abdominal pain, change in bowel habits. Refilled 6 tablets of oxycodone.   I sent a message to Dr Lorenso Courier.  Mariah Schultz

## 2022-03-14 ENCOUNTER — Telehealth: Payer: Self-pay | Admitting: *Deleted

## 2022-03-14 ENCOUNTER — Other Ambulatory Visit: Payer: Self-pay | Admitting: Hematology and Oncology

## 2022-03-14 MED ORDER — OXYCODONE HCL 5 MG PO TABS: 5.0000 mg | ORAL_TABLET | ORAL | 0 refills | Status: DC | PRN

## 2022-03-14 NOTE — Telephone Encounter (Signed)
Received call from pt. She states she needs a refill of her oxycodone. She got only 6 tabs from Dr. Chryl Heck over the weekend. She states the pain pain in her lower back and lower abdomen is increasing. Plus she is experiencing nausea and dry heaves now.  Her ostomy is working though she says the stool is quite pasty. She is drinking  a lot of water/other fluids. She feels that soemthing is just not right in her lower back/lower abd.   Please refill oxycodone today and please advise about her increased pain/nausea. Last scan was 02/03/22

## 2022-03-16 NOTE — Telephone Encounter (Signed)
Received call from patient what Dr. Lorenso Courier recommended related to pt's ongoing lower abd pain. Advised that I spoke to him about and he wants to wait another month before scanning as the last scan just showed mild enlargement. Pt voiced understanding. Advised to call back if pain worsens significantly.  She said she would.

## 2022-03-20 ENCOUNTER — Emergency Department (HOSPITAL_COMMUNITY): Payer: Medicare HMO

## 2022-03-20 ENCOUNTER — Emergency Department (HOSPITAL_COMMUNITY)
Admission: EM | Admit: 2022-03-20 | Discharge: 2022-03-20 | Discharge status: Against Medical Advice | Payer: Medicare HMO | Source: Home / Self Care

## 2022-03-20 ENCOUNTER — Other Ambulatory Visit: Payer: Self-pay

## 2022-03-20 ENCOUNTER — Encounter (HOSPITAL_COMMUNITY): Payer: Self-pay

## 2022-03-20 DIAGNOSIS — K5900 Constipation, unspecified: Secondary | ICD-10-CM | POA: Diagnosis not present

## 2022-03-20 DIAGNOSIS — M545 Low back pain, unspecified: Secondary | ICD-10-CM | POA: Insufficient documentation

## 2022-03-20 DIAGNOSIS — Z7982 Long term (current) use of aspirin: Secondary | ICD-10-CM | POA: Diagnosis not present

## 2022-03-20 DIAGNOSIS — D62 Acute posthemorrhagic anemia: Secondary | ICD-10-CM | POA: Diagnosis present

## 2022-03-20 DIAGNOSIS — Z85828 Personal history of other malignant neoplasm of skin: Secondary | ICD-10-CM | POA: Insufficient documentation

## 2022-03-20 DIAGNOSIS — R310 Gross hematuria: Secondary | ICD-10-CM | POA: Diagnosis present

## 2022-03-20 DIAGNOSIS — K4020 Bilateral inguinal hernia, without obstruction or gangrene, not specified as recurrent: Secondary | ICD-10-CM | POA: Diagnosis not present

## 2022-03-20 DIAGNOSIS — K429 Umbilical hernia without obstruction or gangrene: Secondary | ICD-10-CM | POA: Diagnosis not present

## 2022-03-20 DIAGNOSIS — D509 Iron deficiency anemia, unspecified: Secondary | ICD-10-CM | POA: Diagnosis present

## 2022-03-20 DIAGNOSIS — R109 Unspecified abdominal pain: Secondary | ICD-10-CM | POA: Insufficient documentation

## 2022-03-20 DIAGNOSIS — C768 Malignant neoplasm of other specified ill-defined sites: Secondary | ICD-10-CM | POA: Diagnosis present

## 2022-03-20 DIAGNOSIS — R319 Hematuria, unspecified: Secondary | ICD-10-CM | POA: Insufficient documentation

## 2022-03-20 DIAGNOSIS — N1832 Chronic kidney disease, stage 3b: Secondary | ICD-10-CM | POA: Diagnosis present

## 2022-03-20 DIAGNOSIS — R1900 Intra-abdominal and pelvic swelling, mass and lump, unspecified site: Secondary | ICD-10-CM | POA: Diagnosis not present

## 2022-03-20 DIAGNOSIS — C763 Malignant neoplasm of pelvis: Secondary | ICD-10-CM | POA: Diagnosis not present

## 2022-03-20 DIAGNOSIS — N131 Hydronephrosis with ureteral stricture, not elsewhere classified: Secondary | ICD-10-CM | POA: Diagnosis present

## 2022-03-20 DIAGNOSIS — N1339 Other hydronephrosis: Secondary | ICD-10-CM | POA: Diagnosis not present

## 2022-03-20 DIAGNOSIS — Z5321 Procedure and treatment not carried out due to patient leaving prior to being seen by health care provider: Secondary | ICD-10-CM | POA: Insufficient documentation

## 2022-03-20 DIAGNOSIS — C20 Malignant neoplasm of rectum: Secondary | ICD-10-CM | POA: Diagnosis not present

## 2022-03-20 DIAGNOSIS — D631 Anemia in chronic kidney disease: Secondary | ICD-10-CM | POA: Diagnosis present

## 2022-03-20 DIAGNOSIS — Z923 Personal history of irradiation: Secondary | ICD-10-CM | POA: Diagnosis not present

## 2022-03-20 DIAGNOSIS — Z933 Colostomy status: Secondary | ICD-10-CM | POA: Diagnosis not present

## 2022-03-20 DIAGNOSIS — Z9221 Personal history of antineoplastic chemotherapy: Secondary | ICD-10-CM | POA: Diagnosis not present

## 2022-03-20 DIAGNOSIS — Z90710 Acquired absence of both cervix and uterus: Secondary | ICD-10-CM | POA: Diagnosis not present

## 2022-03-20 DIAGNOSIS — N1330 Unspecified hydronephrosis: Secondary | ICD-10-CM | POA: Diagnosis not present

## 2022-03-20 DIAGNOSIS — K5730 Diverticulosis of large intestine without perforation or abscess without bleeding: Secondary | ICD-10-CM | POA: Diagnosis not present

## 2022-03-20 DIAGNOSIS — Z888 Allergy status to other drugs, medicaments and biological substances status: Secondary | ICD-10-CM | POA: Diagnosis not present

## 2022-03-20 DIAGNOSIS — Z79899 Other long term (current) drug therapy: Secondary | ICD-10-CM | POA: Diagnosis not present

## 2022-03-20 DIAGNOSIS — N179 Acute kidney failure, unspecified: Secondary | ICD-10-CM | POA: Diagnosis present

## 2022-03-20 DIAGNOSIS — I129 Hypertensive chronic kidney disease with stage 1 through stage 4 chronic kidney disease, or unspecified chronic kidney disease: Secondary | ICD-10-CM | POA: Diagnosis present

## 2022-03-20 DIAGNOSIS — I10 Essential (primary) hypertension: Secondary | ICD-10-CM | POA: Diagnosis not present

## 2022-03-20 DIAGNOSIS — Z8249 Family history of ischemic heart disease and other diseases of the circulatory system: Secondary | ICD-10-CM | POA: Diagnosis not present

## 2022-03-20 DIAGNOSIS — D630 Anemia in neoplastic disease: Secondary | ICD-10-CM | POA: Diagnosis present

## 2022-03-20 DIAGNOSIS — Z95828 Presence of other vascular implants and grafts: Secondary | ICD-10-CM | POA: Diagnosis not present

## 2022-03-20 DIAGNOSIS — F17210 Nicotine dependence, cigarettes, uncomplicated: Secondary | ICD-10-CM | POA: Diagnosis present

## 2022-03-20 LAB — CBC WITH DIFFERENTIAL/PLATELET
Abs Immature Granulocytes: 0.04 10*3/uL (ref 0.00–0.07)
Basophils Absolute: 0 10*3/uL (ref 0.0–0.1)
Basophils Relative: 0 %
Eosinophils Absolute: 0.4 10*3/uL (ref 0.0–0.5)
Eosinophils Relative: 4 %
HCT: 29.1 % — ABNORMAL LOW (ref 36.0–46.0)
Hemoglobin: 9.7 g/dL — ABNORMAL LOW (ref 12.0–15.0)
Immature Granulocytes: 0 %
Lymphocytes Relative: 6 %
Lymphs Abs: 0.6 10*3/uL — ABNORMAL LOW (ref 0.7–4.0)
MCH: 30.9 pg (ref 26.0–34.0)
MCHC: 33.3 g/dL (ref 30.0–36.0)
MCV: 92.7 fL (ref 80.0–100.0)
Monocytes Absolute: 0.8 10*3/uL (ref 0.1–1.0)
Monocytes Relative: 7 %
Neutro Abs: 8.9 10*3/uL — ABNORMAL HIGH (ref 1.7–7.7)
Neutrophils Relative %: 83 %
Platelets: 393 10*3/uL (ref 150–400)
RBC: 3.14 MIL/uL — ABNORMAL LOW (ref 3.87–5.11)
RDW: 14.4 % (ref 11.5–15.5)
WBC: 10.8 10*3/uL — ABNORMAL HIGH (ref 4.0–10.5)
nRBC: 0 % (ref 0.0–0.2)

## 2022-03-20 LAB — URINALYSIS, MICROSCOPIC (REFLEX)
Bacteria, UA: NONE SEEN
RBC / HPF: >50 RBC/hpf (ref 0–5)
Squamous Epithelial / HPF: NONE SEEN (ref 0–5)

## 2022-03-20 LAB — COMPREHENSIVE METABOLIC PANEL
ALT: 16 U/L (ref 0–44)
AST: 14 U/L — ABNORMAL LOW (ref 15–41)
Albumin: 3.1 g/dL — ABNORMAL LOW (ref 3.5–5.0)
Alkaline Phosphatase: 107 U/L (ref 38–126)
Anion gap: 10 (ref 5–15)
BUN: 60 mg/dL — ABNORMAL HIGH (ref 8–23)
CO2: 25 mmol/L (ref 22–32)
Calcium: 9.4 mg/dL (ref 8.9–10.3)
Chloride: 102 mmol/L (ref 98–111)
Creatinine, Ser: 1.92 mg/dL — ABNORMAL HIGH (ref 0.44–1.00)
GFR, Estimated: 28 mL/min — ABNORMAL LOW (ref >60)
Glucose, Bld: 101 mg/dL — ABNORMAL HIGH (ref 70–99)
Potassium: 3.9 mmol/L (ref 3.5–5.1)
Sodium: 137 mmol/L (ref 135–145)
Total Bilirubin: 0.3 mg/dL (ref 0.3–1.2)
Total Protein: 8.1 g/dL (ref 6.5–8.1)

## 2022-03-20 LAB — URINALYSIS, ROUTINE W REFLEX MICROSCOPIC

## 2022-03-20 NOTE — ED Notes (Signed)
Patient turned in labels and reports she is leaving.

## 2022-03-20 NOTE — ED Triage Notes (Signed)
Pt reports blood in urine that began today. Pt reports when urinating she felt pressure and pain then passed a blood clot and urinated blood. Pt also endorses mild lower back pain. Hx of carcinoma of presacral region. She reports finishing chemo/radiation in December. Denies taking blood thinners.

## 2022-03-20 NOTE — ED Notes (Signed)
Pt returned blanket and stated that she was leaving. Encouraged to stay, but declined.

## 2022-03-20 NOTE — ED Provider Triage Note (Signed)
Emergency Medicine Provider Triage Evaluation Note  Mariah Schultz , a 66 y.o. female  was evaluated in triage.  Pt complains of flank pain and blood in urine    Review of Systems  Positive: Back pain  Negative: Fever   Physical Exam  BP 102/67 (BP Location: Left Arm)   Pulse 85   Temp 98.5 F (36.9 C) (Oral)   Resp 18   Ht '5\' 4"'$  (1.626 m)   Wt 73.5 kg   SpO2 94%   BMI 27.81 kg/m  Gen:   Awake, no distress   Resp:  Normal effort  MSK:   Moves extremities without difficulty  Other:    Medical Decision Making  Medically screening exam initiated at 1:50 PM.  Appropriate orders placed.  [name] was informed that the remainder of the evaluation will be completed by another provider, this initial triage assessment does not replace that evaluation, and the importance of remaining in the ED until their evaluation is complete.     Fransico Meadow, [place] 03/20/22 1351

## 2022-03-21 ENCOUNTER — Inpatient Hospital Stay: Payer: Medicare HMO

## 2022-03-21 ENCOUNTER — Encounter (HOSPITAL_COMMUNITY): Payer: Self-pay | Admitting: Internal Medicine

## 2022-03-21 ENCOUNTER — Inpatient Hospital Stay (HOSPITAL_COMMUNITY)
Admission: AD | Admit: 2022-03-21 | Discharge: 2022-03-25 | DRG: 660 | Disposition: A | Discharge status: Home / Self Care | Payer: Medicare HMO | Source: Ambulatory Visit | Attending: Internal Medicine | Admitting: Internal Medicine

## 2022-03-21 ENCOUNTER — Inpatient Hospital Stay: Payer: Medicare HMO | Attending: Hematology and Oncology | Admitting: Physician Assistant

## 2022-03-21 VITALS — BP 112/56 | HR 88 | Resp 20 | Wt 162.2 lb

## 2022-03-21 DIAGNOSIS — Z95828 Presence of other vascular implants and grafts: Secondary | ICD-10-CM | POA: Diagnosis not present

## 2022-03-21 DIAGNOSIS — M4316 Spondylolisthesis, lumbar region: Secondary | ICD-10-CM | POA: Insufficient documentation

## 2022-03-21 DIAGNOSIS — Z923 Personal history of irradiation: Secondary | ICD-10-CM

## 2022-03-21 DIAGNOSIS — R319 Hematuria, unspecified: Secondary | ICD-10-CM

## 2022-03-21 DIAGNOSIS — K5730 Diverticulosis of large intestine without perforation or abscess without bleeding: Secondary | ICD-10-CM | POA: Insufficient documentation

## 2022-03-21 DIAGNOSIS — M549 Dorsalgia, unspecified: Secondary | ICD-10-CM | POA: Insufficient documentation

## 2022-03-21 DIAGNOSIS — N1832 Chronic kidney disease, stage 3b: Secondary | ICD-10-CM | POA: Diagnosis present

## 2022-03-21 DIAGNOSIS — Z8249 Family history of ischemic heart disease and other diseases of the circulatory system: Secondary | ICD-10-CM

## 2022-03-21 DIAGNOSIS — Z933 Colostomy status: Secondary | ICD-10-CM | POA: Insufficient documentation

## 2022-03-21 DIAGNOSIS — R310 Gross hematuria: Secondary | ICD-10-CM | POA: Diagnosis not present

## 2022-03-21 DIAGNOSIS — D631 Anemia in chronic kidney disease: Secondary | ICD-10-CM | POA: Diagnosis present

## 2022-03-21 DIAGNOSIS — R1900 Intra-abdominal and pelvic swelling, mass and lump, unspecified site: Secondary | ICD-10-CM | POA: Diagnosis not present

## 2022-03-21 DIAGNOSIS — K4020 Bilateral inguinal hernia, without obstruction or gangrene, not specified as recurrent: Secondary | ICD-10-CM | POA: Insufficient documentation

## 2022-03-21 DIAGNOSIS — D62 Acute posthemorrhagic anemia: Secondary | ICD-10-CM | POA: Diagnosis present

## 2022-03-21 DIAGNOSIS — N1339 Other hydronephrosis: Secondary | ICD-10-CM | POA: Diagnosis not present

## 2022-03-21 DIAGNOSIS — Z888 Allergy status to other drugs, medicaments and biological substances status: Secondary | ICD-10-CM

## 2022-03-21 DIAGNOSIS — Z5112 Encounter for antineoplastic immunotherapy: Secondary | ICD-10-CM | POA: Insufficient documentation

## 2022-03-21 DIAGNOSIS — Z90710 Acquired absence of both cervix and uterus: Secondary | ICD-10-CM

## 2022-03-21 DIAGNOSIS — K5900 Constipation, unspecified: Secondary | ICD-10-CM | POA: Diagnosis not present

## 2022-03-21 DIAGNOSIS — Z79899 Other long term (current) drug therapy: Secondary | ICD-10-CM

## 2022-03-21 DIAGNOSIS — I129 Hypertensive chronic kidney disease with stage 1 through stage 4 chronic kidney disease, or unspecified chronic kidney disease: Secondary | ICD-10-CM | POA: Diagnosis present

## 2022-03-21 DIAGNOSIS — N179 Acute kidney failure, unspecified: Secondary | ICD-10-CM | POA: Diagnosis not present

## 2022-03-21 DIAGNOSIS — C768 Malignant neoplasm of other specified ill-defined sites: Secondary | ICD-10-CM | POA: Diagnosis present

## 2022-03-21 DIAGNOSIS — D649 Anemia, unspecified: Secondary | ICD-10-CM

## 2022-03-21 DIAGNOSIS — F17210 Nicotine dependence, cigarettes, uncomplicated: Secondary | ICD-10-CM | POA: Diagnosis present

## 2022-03-21 DIAGNOSIS — C19 Malignant neoplasm of rectosigmoid junction: Secondary | ICD-10-CM | POA: Insufficient documentation

## 2022-03-21 DIAGNOSIS — D630 Anemia in neoplastic disease: Secondary | ICD-10-CM | POA: Diagnosis present

## 2022-03-21 DIAGNOSIS — N131 Hydronephrosis with ureteral stricture, not elsewhere classified: Secondary | ICD-10-CM | POA: Insufficient documentation

## 2022-03-21 DIAGNOSIS — Z9221 Personal history of antineoplastic chemotherapy: Secondary | ICD-10-CM

## 2022-03-21 DIAGNOSIS — K429 Umbilical hernia without obstruction or gangrene: Secondary | ICD-10-CM | POA: Insufficient documentation

## 2022-03-21 DIAGNOSIS — C763 Malignant neoplasm of pelvis: Secondary | ICD-10-CM | POA: Insufficient documentation

## 2022-03-21 DIAGNOSIS — N1330 Unspecified hydronephrosis: Secondary | ICD-10-CM

## 2022-03-21 DIAGNOSIS — D509 Iron deficiency anemia, unspecified: Secondary | ICD-10-CM | POA: Diagnosis present

## 2022-03-21 DIAGNOSIS — Z7982 Long term (current) use of aspirin: Secondary | ICD-10-CM

## 2022-03-21 LAB — RETICULOCYTES
Immature Retic Fract: 10.5 % (ref 2.3–15.9)
RBC.: 2.91 MIL/uL — ABNORMAL LOW (ref 3.87–5.11)
Retic Count, Absolute: 42.5 10*3/uL (ref 19.0–186.0)
Retic Ct Pct: 1.5 % (ref 0.4–3.1)

## 2022-03-21 LAB — IRON AND TIBC
Iron: 29 ug/dL (ref 28–170)
Saturation Ratios: 11 % (ref 10.4–31.8)
TIBC: 272 ug/dL (ref 250–450)
UIBC: 243 ug/dL

## 2022-03-21 LAB — CMP (CANCER CENTER ONLY)
ALT: 12 U/L (ref 0–44)
AST: 12 U/L — ABNORMAL LOW (ref 15–41)
Albumin: 3.6 g/dL (ref 3.5–5.0)
Alkaline Phosphatase: 111 U/L (ref 38–126)
Anion gap: 7 (ref 5–15)
BUN: 56 mg/dL — ABNORMAL HIGH (ref 8–23)
CO2: 28 mmol/L (ref 22–32)
Calcium: 9.5 mg/dL (ref 8.9–10.3)
Chloride: 99 mmol/L (ref 98–111)
Creatinine: 1.97 mg/dL — ABNORMAL HIGH (ref 0.44–1.00)
GFR, Estimated: 27 mL/min — ABNORMAL LOW (ref >60)
Glucose, Bld: 166 mg/dL — ABNORMAL HIGH (ref 70–99)
Potassium: 3.9 mmol/L (ref 3.5–5.1)
Sodium: 134 mmol/L — ABNORMAL LOW (ref 135–145)
Total Bilirubin: 0.3 mg/dL (ref 0.3–1.2)
Total Protein: 7.8 g/dL (ref 6.5–8.1)

## 2022-03-21 LAB — CBC WITH DIFFERENTIAL (CANCER CENTER ONLY)
Abs Immature Granulocytes: 0.02 10*3/uL (ref 0.00–0.07)
Basophils Absolute: 0 10*3/uL (ref 0.0–0.1)
Basophils Relative: 0 %
Eosinophils Absolute: 0.7 10*3/uL — ABNORMAL HIGH (ref 0.0–0.5)
Eosinophils Relative: 6 %
HCT: 27.1 % — ABNORMAL LOW (ref 36.0–46.0)
Hemoglobin: 9.2 g/dL — ABNORMAL LOW (ref 12.0–15.0)
Immature Granulocytes: 0 %
Lymphocytes Relative: 4 %
Lymphs Abs: 0.4 10*3/uL — ABNORMAL LOW (ref 0.7–4.0)
MCH: 30.7 pg (ref 26.0–34.0)
MCHC: 33.9 g/dL (ref 30.0–36.0)
MCV: 90.3 fL (ref 80.0–100.0)
Monocytes Absolute: 0.6 10*3/uL (ref 0.1–1.0)
Monocytes Relative: 6 %
Neutro Abs: 8.7 10*3/uL — ABNORMAL HIGH (ref 1.7–7.7)
Neutrophils Relative %: 84 %
Platelet Count: 338 10*3/uL (ref 150–400)
RBC: 3 MIL/uL — ABNORMAL LOW (ref 3.87–5.11)
RDW: 14.4 % (ref 11.5–15.5)
WBC Count: 10.4 10*3/uL (ref 4.0–10.5)
nRBC: 0 % (ref 0.0–0.2)

## 2022-03-21 LAB — VITAMIN B12: Vitamin B-12: 866 pg/mL (ref 180–914)

## 2022-03-21 LAB — FERRITIN: Ferritin: 268 ng/mL (ref 11–307)

## 2022-03-21 LAB — FOLATE: Folate: >40 ng/mL (ref >5.9)

## 2022-03-21 MED ORDER — ACETAMINOPHEN 325 MG PO TABS
650.0000 mg | ORAL_TABLET | Freq: Four times a day (QID) | ORAL | Status: DC | PRN
  Administered 2022-03-22 – 2022-03-25 (×8): 650 mg via ORAL
  Filled 2022-03-21 (×8): qty 2

## 2022-03-21 MED ORDER — SODIUM CHLORIDE 0.9 % IV SOLN
INTRAVENOUS | Status: DC
  Administered 2022-03-21: 1000 mL via INTRAVENOUS

## 2022-03-21 MED ORDER — SODIUM CHLORIDE 0.9 % IV SOLN: 1.0000 mg/h | Freq: Once | INTRAVENOUS | Status: DC

## 2022-03-21 MED ORDER — GABAPENTIN 100 MG PO CAPS
100.0000 mg | ORAL_CAPSULE | Freq: Two times a day (BID) | ORAL | Status: DC
  Administered 2022-03-21 – 2022-03-25 (×7): 100 mg via ORAL
  Filled 2022-03-21 (×7): qty 1

## 2022-03-21 MED ORDER — HYDROMORPHONE HCL 1 MG/ML IJ SOLN
1.0000 mg | INTRAMUSCULAR | Status: DC | PRN
  Administered 2022-03-21: 1 mg via INTRAVENOUS
  Filled 2022-03-21: qty 1

## 2022-03-21 MED ORDER — POLYETHYLENE GLYCOL 3350 17 G PO PACK
17.0000 g | PACK | Freq: Every day | ORAL | Status: DC
  Administered 2022-03-21 – 2022-03-23 (×2): 17 g via ORAL
  Filled 2022-03-21 (×2): qty 1

## 2022-03-21 MED ORDER — HYDROMORPHONE HCL 1 MG/ML IJ SOLN
1.0000 mg | INTRAMUSCULAR | Status: DC | PRN
  Administered 2022-03-21 – 2022-03-22 (×3): 2 mg via INTRAVENOUS
  Administered 2022-03-22: 1 mg via INTRAVENOUS
  Administered 2022-03-22 – 2022-03-23 (×6): 2 mg via INTRAVENOUS
  Filled 2022-03-21 (×9): qty 2
  Filled 2022-03-21: qty 1

## 2022-03-21 MED ORDER — ACETAMINOPHEN 650 MG RE SUPP: 650.0000 mg | Freq: Four times a day (QID) | RECTAL | Status: DC | PRN

## 2022-03-21 MED ORDER — OXYCODONE HCL 5 MG PO TABS
5.0000 mg | ORAL_TABLET | ORAL | Status: DC | PRN
  Administered 2022-03-23 – 2022-03-24 (×4): 5 mg via ORAL
  Filled 2022-03-21 (×4): qty 1

## 2022-03-21 MED ORDER — HYDROMORPHONE HCL 1 MG/ML IJ SOLN
1.0000 mg | Freq: Once | INTRAMUSCULAR | Status: AC
  Administered 2022-03-21: 1 mg via INTRAVENOUS
  Filled 2022-03-21: qty 1

## 2022-03-21 MED ORDER — ADULT MULTIVITAMIN W/MINERALS CH
1.0000 | ORAL_TABLET | Freq: Every day | ORAL | Status: DC
  Administered 2022-03-21 – 2022-03-25 (×4): 1 via ORAL
  Filled 2022-03-21 (×4): qty 1

## 2022-03-21 MED ORDER — ONDANSETRON HCL 4 MG PO TABS: 4.0000 mg | ORAL_TABLET | Freq: Four times a day (QID) | ORAL | Status: DC | PRN

## 2022-03-21 MED ORDER — ONDANSETRON HCL 4 MG/2ML IJ SOLN: 4.0000 mg | Freq: Four times a day (QID) | INTRAMUSCULAR | Status: DC | PRN

## 2022-03-21 MED ORDER — MORPHINE SULFATE (PF) 2 MG/ML IV SOLN
2.0000 mg | Freq: Once | INTRAVENOUS | Status: AC
  Administered 2022-03-21: 2 mg via INTRAVENOUS
  Filled 2022-03-21: qty 1

## 2022-03-21 MED ORDER — ONDANSETRON HCL 4 MG/2ML IJ SOLN
4.0000 mg | Freq: Once | INTRAMUSCULAR | Status: AC
  Administered 2022-03-21: 4 mg via INTRAVENOUS
  Filled 2022-03-21: qty 2

## 2022-03-21 MED ORDER — SENNOSIDES-DOCUSATE SODIUM 8.6-50 MG PO TABS: 1.0000 | ORAL_TABLET | Freq: Every evening | ORAL | Status: DC | PRN

## 2022-03-21 MED ORDER — HYDRALAZINE HCL 20 MG/ML IJ SOLN: 5.0000 mg | Freq: Four times a day (QID) | INTRAMUSCULAR | Status: DC | PRN

## 2022-03-21 NOTE — H&P (View-Only) (Signed)
Urology Consult Note   Requesting Attending Physician:  Elmarie Shiley, MD Service Providing Consult: Urology  Consulting Attending: Dr. Jeffie Pollock   Reason for Consult:  hydronephrosis and hematuria  HPI: Mariah Schultz is seen in consultation for reasons noted above at the request of Regalado, Belkys A, MD for evaluation of AKI and right hydronephrosis.  This is a 66 y.o. 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 Originally presented 2002 with CT showing a posterior pelvic mass. Colonoscopy with biopsy showed poorly differentiated high-grade carcinoma. Now s/p chemo and radiation completed December 2002. Interval MRI 02/01/22 with enlarged mass. Presented to ED 03/20/22 with increasing pelvic pain and rectal pressure and hematuria. Repeat CT demonstrated moderate/severe right sided hydronephrosis with a pelvic tissue mass with interval growth.   Currently afebrile and hemodynamically stable. It appears the mass is completely encompassing the right ureter. Creatinine 1.9 from baseline of ~1.1. Urinalysis with large amount of blood but low concern for infection. It does appear that her 01/22/22 MRI shows some right sided ureteral dilation but exam was not extended to the abdomen so unclear appearance of kidneys at that time. Endorses some recent difficulty voiding and passage of small clots. No flank pain.   Past Medical History: Past Medical History:  Diagnosis Date   CAP (community acquired pneumonia) 09/10/2013   History of radiation therapy    pelvis 07/19/2021-09/01/2021  Dr Gery Pray   Hypertension    Papilloma of left breast    Papilloma of left breast 02/26/2019   Perforated appendicitis 03/19/2013    Past Surgical History:  Past Surgical History:  Procedure Laterality Date   ABDOMINAL HYSTERECTOMY     BREAST EXCISIONAL BIOPSY Left 02/26/2019   B9 Biopsy   BREAST LUMPECTOMY WITH RADIOACTIVE SEED  LOCALIZATION Left 02/26/2019   Procedure: LEFT BREAST LUMPECTOMY WITH RADIOACTIVE SEED LOCALIZATION;  Surgeon: Fanny Skates, MD;  Location: [facility];  Service: General;  Laterality: Left;   IR IMAGING GUIDED PORT INSERTION  07/21/2021   LAPAROSCOPIC APPENDECTOMY N/A 02/25/2013   Procedure: APPENDECTOMY LAPAROSCOPIC;  Surgeon: Harl Bowie, MD;  Location: [facility];  Service: General;  Laterality: N/A;   LAPAROSCOPY N/A 06/22/2021   Procedure: LAPAROSCOPY DIAGNOSTIC, DIVERTING LOOP COLOSTOMY, ANORECTAL EXAM UNDER ANESTHESIA, TRANSRECTAL CORE BIOPSIES.;  Surgeon: Lafonda Mosses, MD;  Location: [facility];  Service: Gynecology;  Laterality: N/A;    Medication: Current Facility-Administered Medications  Medication Dose Route Frequency Provider Last Rate Last Admin   0.9 %  sodium chloride infusion   Intravenous Continuous Regalado, Belkys A, MD       acetaminophen (TYLENOL) tablet 650 mg  650 mg Oral Q6H PRN Regalado, Belkys A, MD       Or   acetaminophen (TYLENOL) suppository 650 mg  650 mg Rectal Q6H PRN Regalado, Belkys A, MD       gabapentin (NEURONTIN) capsule 100 mg  100 mg Oral BID Regalado, Belkys A, MD       hydrALAZINE (APRESOLINE) injection 5 mg  5 mg Intravenous Q6H PRN Regalado, Belkys A, MD       HYDROmorphone (DILAUDID) injection 1 mg  1 mg Intravenous Q3H PRN Regalado, Belkys A, MD   1 mg at 03/21/22 1709   multivitamin with minerals tablet 1 tablet  1 tablet Oral Daily Regalado, Belkys A, MD       ondansetron (ZOFRAN) tablet 4 mg  4 mg Oral Q6H PRN Regalado, Cassie Freer, MD  Or   ondansetron (ZOFRAN) injection 4 mg  4 mg Intravenous Q6H PRN Regalado, Belkys A, MD       oxyCODONE (Oxy IR/ROXICODONE) immediate release tablet 5 mg  5 mg Oral Q4H PRN Regalado, Belkys A, MD       polyethylene glycol (MIRALAX / GLYCOLAX) packet 17 g  17 g Oral Daily Regalado, Belkys A, MD       senna-docusate (Senokot-S) tablet 1 tablet  1 tablet Oral QHS PRN Regalado, Belkys  A, MD        Allergies: Allergies  Allergen Reactions   Other Swelling    pneumonia vaccine   Pneumococcal Vac Polyvalent Other (See Comments)    Social History: Social History   Tobacco Use   Smoking status: Every Day    Packs/day: 1.00    Years: 40.00    Total pack years: 40.00    Types: Cigarettes   Smokeless tobacco: Never  Vaping Use   Vaping Use: Never used  Substance Use Topics   Alcohol use: Yes    Comment: occ   Drug use: No    Family History Family History  Problem Relation Age of Onset   Heart disease Father    Colon cancer Neg Hx    Breast cancer Neg Hx    Ovarian cancer Neg Hx    Pancreatic cancer Neg Hx    Prostate cancer Neg Hx    Endometrial cancer Neg Hx        Patient unaware    Review of Systems 10 systems were reviewed and are negative except as noted specifically in the HPI.  Objective   Vital signs in last 24 hours: BP (!) 131/91 (BP Location: Left Arm)   Pulse 80   Temp 98.4 F (36.9 C) (Oral)   SpO2 96%   Physical Exam General: NAD, A&O, resting, appropriate HEENT: /AT, EOMI, MMM Pulmonary: Normal work of breathing Cardiovascular: HDS, adequate peripheral perfusion Abdomen: Soft, NTTP, obtunded but non-distended. GU: Voiding spontaneously, no CVA tenderness Extremities: warm and well perfused Neuro: Appropriate, no focal neurological deficits  Most Recent Labs: Lab Results  Component Value Date   WBC 10.4 03/21/2022   HGB 9.2 (L) 03/21/2022   HCT 27.1 (L) 03/21/2022   PLT 338 03/21/2022    Lab Results  Component Value Date   NA 134 (L) 03/21/2022   K 3.9 03/21/2022   CL 99 03/21/2022   CO2 28 03/21/2022   BUN 56 (H) 03/21/2022   CREATININE 1.97 (H) 03/21/2022   CALCIUM 9.5 03/21/2022   MG 1.7 08/23/2021    No results found for: "INR", "APTT"   Urine Culture: '@[id number]'$ (laburin,org,[id number])@   IMAGING: CT Renal Stone Study  Result Date: 03/20/2022 CLINICAL DATA:  Flank pain, hematuria EXAM: CT  ABDOMEN AND PELVIS WITHOUT CONTRAST TECHNIQUE: Multidetector CT imaging of the abdomen and pelvis was performed following the standard protocol without IV contrast. RADIATION DOSE REDUCTION: This exam was performed according to the departmental dose-optimization program which includes automated exposure control, adjustment of the mA and/or kV according to patient size and/or use of iterative reconstruction technique. COMPARISON:  05/11/2021 FINDINGS: Lower chest: Small linear densities in the lower lung fields suggest scarring or subsegmental atelectasis. There is ectasia of bronchi. Hepatobiliary: Liver is enlarged measuring 19.6 cm in length. No focal abnormalities are seen. Gallbladder is unremarkable. Pancreas: There is atrophy and pancreas. No focal abnormalities are seen. Spleen: Unremarkable. Adrenals/Urinary Tract: Adrenals are unremarkable. There is no hydronephrosis on the left side. There is  moderate to marked right hydronephrosis. There is dilation of right ureter to the level of pelvic inlet. There are no opaque renal or ureteral stones. There is a soft tissue mass measuring 11.1 x 7 cm in size and pelvis extending more to the right. Left ureter is not dilated. Urinary bladder is not distended. Stomach/Bowel: Stomach is unremarkable. Small bowel loops are not dilated. The appendix is not seen. Colostomy is noted in left mid abdomen. Diverticula are seen in colon without signs of focal diverticulitis. Vascular/Lymphatic: Scattered arterial calcifications are seen. Reproductive: Uterus could not be distinctly identified. Other: There is no ascites or pneumoperitoneum. Small bilateral inguinal hernias containing fat are noted. Small umbilical hernia containing fat is seen. Musculoskeletal: There is minimal anterolisthesis at L4-L5 level. IMPRESSION: There is no evidence of intestinal obstruction or pneumoperitoneum. There is interval appearance of moderate to severe right hydronephrosis. There are no  demonstrable opaque renal or ureteral stones. There is 11.1 x 7 cm soft tissue mass in pelvic cavity extending more to the right. Findings suggest possible high-grade obstruction of the distal right ureter caused by extrinsic compression or infiltration by the malignant neoplasm in pelvic cavity. Diverticulosis of colon without signs of focal diverticulitis. 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 Other findings as described in the body of the report. Electronically Signed   By: Elmer Picker M.D.   On: 03/20/2022 14:42    ------  Assessment:  66 y.o. female with poorly differentiated carcinoma with progression and new right sided malignant ureteral obstruction with AKI and hematuria. No evidence of concurrent UTI.   Hydronephrosis - creatinine elevated from baseline, contralateral kidney well appearing. Unclear if mass has invaded the ureter vs extrinsic compression. 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 Hgb stable but down from mid-June.    Recommendations: - Please place 20Fr foley catheter, urology will come flush to evaluate clot burden - Keep patient NPO at midnight tonight. Discussed options and risks/benefits of stent vs nephrostomy tube. Will tentatively plan for cystoscopy with possible fulguration and attempted right ureteral stent placement. If stent is unsuccessful, discussed need for nephrostomy tube. - Continue to monitor Hgb  Thank you for this consult. Please contact the urology consult pager with any further questions/concerns.

## 2022-03-21 NOTE — H&P (Signed)
History and Physical  Mariah Schultz RJJ:[id number] DOB: 16-Nov-1955 DOA: 03/21/2022  PCP: Seward Carol, MD Patient coming from: Home   I have personally briefly reviewed patient's old medical records in [facility]   Chief Complaint: back pain, hematuria.   HPI: Mariah Schultz is a 66 y.o. 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  Immunophenotype type not consistent with colorectal carcinoma.  06/2021 underwent laparoscopic diagnostic procedure with diverting loop colostomy and and anorectal exam under anesthesia.  Pathology results were consistent with a squamous cell carcinoma of unclear etiology.  She underwent radiation and chemotherapy which were completed December 2022.  MRI 02/01/2022 showed mildly enlarged mass measuring 6 x 4 cm.  She was going to have a repeated MRI in August 2023, she presented to the ED on 7/16 complaining of back pain, abdominal pain and rectal pressure that  started several weeks ago, getting worse.  She also presented with hematuria that  started 2 days prior to admission.  She has been passing a small blood clot when she urinate.   She was evaluated in the ED 7/16, she was waiting for bed, but she decided to go home because it was taking too long.  She went to see oncology PA today and was referred for direct admission for further evaluation.  She report decreased appetite and poor oral intake.  She also report having decreases ostomy output. She think is because she has  not been eating.   CT renal protocol  performed on 7/16: There is no evidence of intestinal obstruction or pneumoperitoneum. There is interval appearance of  moderate to severe right hydronephrosis. There are no demonstrable opaque renal or ureteral stones. There is 11.1 x 7 cm soft tissue mass in pelvic cavity extending more to the right. Findings suggest possible high-grade obstruction of the distal right ureter caused by extrinsic compression or infiltration by the malignant neoplasm in pelvic cavity.  Diverticulosis of colon without signs of focal diverticulitis. 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  Labs: Sodium 134, BUN 56, creatinine 1.9, sodium 134, hemoglobin 9.2, white blood cell 10.4, UA turbid with more than 50 red blood cell.  Review of Systems: All systems reviewed and apart from history of presenting illness, are negative.  Past Medical History:  Diagnosis Date   CAP (community acquired pneumonia) 09/10/2013   History of radiation therapy    pelvis 07/19/2021-09/01/2021  Dr Gery Pray   Hypertension    Papilloma of left breast    Papilloma of left breast 02/26/2019   Perforated appendicitis 03/19/2013   Past Surgical History:  Procedure Laterality Date   ABDOMINAL HYSTERECTOMY     BREAST EXCISIONAL BIOPSY Left 02/26/2019   B9 Biopsy   BREAST LUMPECTOMY WITH RADIOACTIVE SEED LOCALIZATION Left 02/26/2019   Procedure: LEFT BREAST LUMPECTOMY WITH RADIOACTIVE SEED LOCALIZATION;  Surgeon: Fanny Skates, MD;  Location: [facility];  Service: General;  Laterality: Left;   IR IMAGING GUIDED PORT INSERTION  07/21/2021   LAPAROSCOPIC APPENDECTOMY N/A 02/25/2013   Procedure: APPENDECTOMY LAPAROSCOPIC;  Surgeon: Harl Bowie, MD;  Location: [facility];  Service: General;  Laterality: N/A;   LAPAROSCOPY N/A 06/22/2021   Procedure: LAPAROSCOPY DIAGNOSTIC, DIVERTING LOOP COLOSTOMY, ANORECTAL EXAM UNDER  ANESTHESIA, TRANSRECTAL CORE BIOPSIES.;  Surgeon: Lafonda Mosses, MD;  Location: [facility];  Service: Gynecology;  Laterality: N/A;   Social History:  reports that she has been smoking cigarettes. She has a 40.00  pack-year smoking history. She has never used smokeless tobacco. She reports current alcohol use. She reports that she does not use drugs.   Allergies  Allergen Reactions   Other Swelling    pneumonia vaccine   Pneumococcal Vac Polyvalent Other (See Comments)    Family History  Problem Relation Age of Onset   Heart disease Father    Colon cancer Neg Hx    Breast cancer Neg Hx    Ovarian cancer Neg Hx    Pancreatic cancer Neg Hx    Prostate cancer Neg Hx    Endometrial cancer Neg Hx        Patient unaware    Prior to Admission medications   Medication Sig Start Date End Date Taking? Authorizing Provider  acetaminophen (TYLENOL) 500 MG tablet Take 500 mg by mouth every 6 (six) hours as needed.    [provider]  aspirin 81 MG tablet Take 81 mg by mouth daily.    [provider]  gabapentin (NEURONTIN) 100 MG capsule Take 100 mg by mouth 2 (two) times daily.    [provider]  lidocaine-prilocaine (EMLA) cream Apply 1 application topically as needed. 07/08/21   Orson Slick, MD  Multiple Vitamin (MULTIVITAMIN) tablet Take 1 tablet by mouth daily.    [provider]  Omega-3 Fatty Acids (FISH OIL PO) Take 1 capsule by mouth daily.    [provider]  ondansetron (ZOFRAN) 8 MG tablet Take 1 tablet (8 mg total) by mouth every 8 (eight) hours as needed. Patient not taking: Reported on 02/15/2022 07/08/21   Orson Slick, MD  oxyCODONE (OXY IR/ROXICODONE) 5 MG immediate release tablet Take 1 tablet (5 mg total) by mouth every 4 (four) hours as needed for severe pain. 03/14/22   Orson Slick, MD  Polyethylene Glycol 3350 (MIRALAX PO) Take by mouth as needed.    [provider]  prochlorperazine (COMPAZINE) 10 MG tablet Take 1 tablet (10 mg total) by mouth every 6 (six) hours as needed for nausea or vomiting. Patient not taking: Reported on 02/15/2022 07/08/21   Orson Slick, MD  valsartan-hydrochlorothiazide (DIOVAN-HCT)  160-12.5 MG tablet Take 1 tablet by mouth daily. 04/07/21   [provider]  vitamin C (ASCORBIC ACID) 500 MG tablet Take 500 mg by mouth daily.    [provider]   Physical Exam: Vitals:   03/21/22 1626  BP: (!) 131/91  Pulse: 80  Temp: 98.4 F (36.9 C)  TempSrc: Oral  SpO2: 96%    General exam: Moderately built and nourished patient, uncomfortable in pain.  Head, eyes and ENT: Nontraumatic and normocephalic. Pupils equally reacting to light and accommodation. Oral mucosa moist. Neck: Supple. No JVD, carotid bruit or thyromegaly. Lymphatics: No lymphadenopathy. Respiratory system: Clear to auscultation. No increased work of breathing. Cardiovascular system: S1 and S2 heard, RRR. No JVD, murmurs, gallops, clicks or pedal edema. Gastrointestinal system: Abdomen is nondistended, soft , ostomy in place.  Central nervous system: Alert and oriented. No focal neurological deficits. Extremities: Symmetric 5 x 5 power. Peripheral pulses symmetrically felt.  Skin: No rashes or acute findings. Musculoskeletal system: Negative exam. Psychiatry: Pleasant and cooperative.   Labs on Admission:  Basic Metabolic Panel: Recent Labs  Lab 03/20/22 1431  03/21/22 1212  NA 137 134*  K 3.9 3.9  CL 102 99  CO2 25 28  GLUCOSE 101* 166*  BUN 60* 56*  CREATININE 1.92* 1.97*  CALCIUM 9.4 9.5   Liver Function Tests: Recent Labs  Lab 03/20/22 1431 03/21/22 1212  AST 14* 12*  ALT 16 12  ALKPHOS 107 111  BILITOT 0.3 0.3  PROT 8.1 7.8  ALBUMIN 3.1* 3.6   No results for input(s): "LIPASE", "AMYLASE" in the last 168 hours. No results for input(s): "AMMONIA" in the last 168 hours. CBC: Recent Labs  Lab 03/20/22 1431 03/21/22 1212  WBC 10.8* 10.4  NEUTROABS 8.9* 8.7*  HGB 9.7* 9.2*  HCT 29.1* 27.1*  MCV 92.7 90.3  PLT 393 338   Cardiac Enzymes: No results for input(s): "CKTOTAL", "CKMB", "CKMBINDEX", "TROPONINI" in the last 168 hours.  BNP (last 3 results) No  results for input(s): "PROBNP" in the last 8760 hours. CBG: No results for input(s): "GLUCAP" in the last 168 hours.  Radiological Exams on Admission: CT Renal Stone Study  Result Date: 03/20/2022 CLINICAL DATA:  Flank pain, hematuria EXAM: CT ABDOMEN AND PELVIS WITHOUT CONTRAST TECHNIQUE: Multidetector CT imaging of the abdomen and pelvis was performed following the standard protocol without IV contrast. RADIATION DOSE REDUCTION: This exam was performed according to the departmental dose-optimization program which includes automated exposure control, adjustment of the mA and/or kV according to patient size and/or use of iterative reconstruction technique. COMPARISON:  05/11/2021 FINDINGS: Lower chest: Small linear densities in the lower lung fields suggest scarring or subsegmental atelectasis. There is ectasia of bronchi. Hepatobiliary: Liver is enlarged measuring 19.6 cm in length. No focal abnormalities are seen. Gallbladder is unremarkable. Pancreas: There is atrophy and pancreas. No focal abnormalities are seen. Spleen: Unremarkable. Adrenals/Urinary Tract: Adrenals are unremarkable. There is no hydronephrosis on the left side. There is moderate to marked right hydronephrosis. There is dilation of right ureter to the level of pelvic inlet. There are no opaque renal or ureteral stones. There is a soft tissue mass measuring 11.1 x 7 cm in size and pelvis extending more to the right. Left ureter is not dilated. Urinary bladder is not distended. Stomach/Bowel: Stomach is unremarkable. Small bowel loops are not dilated. The appendix is not seen. Colostomy is noted in left mid abdomen. Diverticula are seen in colon without signs of focal diverticulitis. Vascular/Lymphatic: Scattered arterial calcifications are seen. Reproductive: Uterus could not be distinctly identified. Other: There is no ascites or pneumoperitoneum. Small bilateral inguinal hernias containing fat are noted. Small umbilical hernia containing  fat is seen. Musculoskeletal: There is minimal anterolisthesis at L4-L5 level. IMPRESSION: There is no evidence of intestinal obstruction or pneumoperitoneum. There is interval appearance of moderate to severe right hydronephrosis. There are no demonstrable opaque renal or ureteral stones. There is 11.1 x 7 cm soft tissue mass in pelvic cavity extending more to the right. Findings suggest possible high-grade obstruction of the distal right ureter caused by extrinsic compression or infiltration by the malignant neoplasm in pelvic cavity. Diverticulosis of colon without signs of focal diverticulitis. 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 Other findings as described in the body of the report. Electronically Signed   By: Elmer Picker M.D.   On: 03/20/2022 14:42     Assessment/Plan Principal Problem:   AKI (acute kidney injury) ([place]) Active Problems:   Mass of pelvis   History of radiation therapy   Hematuria   Normocytic anemia   Hydronephrosis, right   1-Hematuria; Mass  likely infiltrating bladder.  Urology Consulted. Dr Roni Bread will see patient in consultation.  IV fluids Check urine culture.  Hold aspirin.   2-CKD; in setting obstructive uropathy In setting of pelvis mass obstructing right ureter.  IV fluids. Cr two months ago 1.2, one month ago 1.9. presents with cr 1.9 Dr Roni Bread will see patient in consultation,. He will evaluate patient and decide if patient needs nephrostomy tube or cystoscopy.   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  Dr Lorenso Courier will see patient in consultation.  IV dilaudid to help with pain management.   4-Anemia; check anemia panel.  Monitor hb in setting of hematuria.    5-HTN; Hold Diovan due to renal function. PRN hydralazine.     DVT Prophylaxis: SCD, avoid blood thinner due to hematuria.  Code Status: Full Code Family Communication: Care Discussed with patient.   Disposition Plan: admit under observation for management of AKI and obstructive uropathy.   Time spent: 75 minutes  Elmarie Shiley MD [facility]   03/21/2022, 4:51 PM

## 2022-03-21 NOTE — Progress Notes (Signed)
Patient transferred to room 1603 via wheelchair in stable condition. Husband accompanied patient.  Report given to Adelino, Therapist, sports.

## 2022-03-21 NOTE — Progress Notes (Signed)
Symptom Management Consult note [place]    Patient Care Team: Seward Carol, MD as PCP - General (Internal Medicine) Orson Slick, MD as Consulting Physician (Hematology and Oncology) Otis Brace, MD as Consulting Physician (Gastroenterology) Michael Boston, MD as Consulting Physician (Colon and Rectal Surgery) Lafonda Mosses, MD as Consulting Physician (Gynecologic Oncology) Gery Pray, MD as Consulting Physician (Radiation Oncology)    Name of the patient: Mariah Schultz  [id number]  Dec 12, 1955   Date of visit: 03/21/2022    Chief complaint/ Reason for visit- hematuria  Oncology History  Squamous Cell Carcinoma of presacral region   06/22/2021 Initial Diagnosis   Squamous Cell Carcinoma of presacral region    07/20/2021 -  Chemotherapy   Patient is on Treatment Plan : Squamous Cell-- Cisplatin q7d       Previous Therapy: Completed cisplatin chemotherapy 08/31/22    Interval history- Mariah Schultz is a 66 y.o. with oncologic history as above presenting to [facility] today with chief complaint of hematuria x 2 days.  Patient states yesterday morning while urinating her stream paused she passed a dime sized blood clot and then stream was able to finish.  She states this is happened now every time with urination.  She felt like this morning she had difficulty emptying her bladder after passing the blood clot.  She states the blood clot this morning was about the size of a quarter.  Patient also is endorsing intermittent back pain and right lower quadrant pain.  The back pain started in her right lower back and now radiates across the low back.  She describes it as a dull pressure and rates the pain 8 of 10 in severity.  She describes the right lower quadrant pain as throbbing and cramping.  That pain is also radiating across her lower abdomen and she rates it 5 out of 10 in severity.  She has been taking oxycodone and Tylenol for pain.  She  previously felt her pain was well controlled with that regimen however now the oxycodone is barely helping.  She last took a dose at 10 AM this morning.  She is also endorsing ongoing pressure sensation in her rectum.  She has a history of passing jellylike mucus from her rectum and last week passed a large amount which was unusual.  She describes the mucus as a handful size and opaque.  Denies any blood.  She has an ostomy and states that output has been decreased as she has had decreased appetite over the last month.  She endorses associated nausea. Patient is lost 6 pounds in the last 2 months.  She also endorses feeling off balance and has to hold onto things while walking which is new over the last month.  She has not had any falls recently. She admits to night sweats which have been ongoing since her chemotherapy, no change. She denies fever, chills, visual changes, shortness of breath, chest pain, numbness, weakness.   Spouse accompanies patient today and provides additional history.     ROS  All other systems are reviewed and are negative for acute change except as noted in the HPI.    Allergies  Allergen Reactions   Other Swelling    pneumonia vaccine   Pneumococcal Vac Polyvalent Other (See Comments)     Past Medical History:  Diagnosis Date   CAP (community acquired pneumonia) 09/10/2013   History of radiation therapy    pelvis 07/19/2021-09/01/2021  Dr Gery Pray  Hypertension    Papilloma of left breast    Papilloma of left breast 02/26/2019   Perforated appendicitis 03/19/2013     Past Surgical History:  Procedure Laterality Date   ABDOMINAL HYSTERECTOMY     BREAST EXCISIONAL BIOPSY Left 02/26/2019   B9 Biopsy   BREAST LUMPECTOMY WITH RADIOACTIVE SEED LOCALIZATION Left 02/26/2019   Procedure: LEFT BREAST LUMPECTOMY WITH RADIOACTIVE SEED LOCALIZATION;  Surgeon: Fanny Skates, MD;  Location: [place];  Service: General;  Laterality: Left;   IR  IMAGING GUIDED PORT INSERTION  07/21/2021   LAPAROSCOPIC APPENDECTOMY N/A 02/25/2013   Procedure: APPENDECTOMY LAPAROSCOPIC;  Surgeon: Harl Bowie, MD;  Location: [place];  Service: General;  Laterality: N/A;   LAPAROSCOPY N/A 06/22/2021   Procedure: LAPAROSCOPY DIAGNOSTIC, DIVERTING LOOP COLOSTOMY, ANORECTAL EXAM UNDER ANESTHESIA, TRANSRECTAL CORE BIOPSIES.;  Surgeon: Lafonda Mosses, MD;  Location: [facility];  Service: Gynecology;  Laterality: N/A;    Social History   Socioeconomic History   Marital status: Married    Spouse name: Not on file   Number of children: Not on file   Years of education: Not on file   Highest education level: Not on file  Occupational History   Not on file  Tobacco Use   Smoking status: Every Day    Packs/day: 1.00    Years: 40.00    Total pack years: 40.00    Types: Cigarettes   Smokeless tobacco: Never  Vaping Use   Vaping Use: Never used  Substance and Sexual Activity   Alcohol use: Yes    Comment: occ   Drug use: No   Sexual activity: Not Currently    Birth control/protection: Surgical  Other Topics Concern   Not on file  Social History Narrative   Not on file   Social Determinants of Health   Financial Resource Strain: Low Risk  (06/16/2021)   Overall Financial Resource Strain (CARDIA)    Difficulty of Paying Living Expenses: Not very hard  Food Insecurity: No Food Insecurity (06/16/2021)   Hunger Vital Sign    Worried About Running Out of Food in the Last Year: Never true    Ran Out of Food in the Last Year: Never true  Transportation Needs: No Transportation Needs (06/16/2021)   PRAPARE - Hydrologist (Medical): No    Lack of Transportation (Non-Medical): No  Physical Activity: Not on file  Stress: Not on file  Social Connections: Not on file  Intimate Partner Violence: Not on file    Family History  Problem Relation Age of Onset   Heart disease Father    Colon cancer Neg Hx    Breast  cancer Neg Hx    Ovarian cancer Neg Hx    Pancreatic cancer Neg Hx    Prostate cancer Neg Hx    Endometrial cancer Neg Hx        Patient unaware     Current Outpatient Medications:    acetaminophen (TYLENOL) 500 MG tablet, Take 500 mg by mouth every 6 (six) hours as needed., Disp: , Rfl:    aspirin 81 MG tablet, Take 81 mg by mouth daily., Disp: , Rfl:    gabapentin (NEURONTIN) 100 MG capsule, Take 100 mg by mouth 2 (two) times daily., Disp: , Rfl:    lidocaine-prilocaine (EMLA) cream, Apply 1 application topically as needed., Disp: 30 g, Rfl: 0   Multiple Vitamin (MULTIVITAMIN) tablet, Take 1 tablet by mouth daily., Disp: , Rfl:  Omega-3 Fatty Acids (FISH OIL PO), Take 1 capsule by mouth daily., Disp: , Rfl:    ondansetron (ZOFRAN) 8 MG tablet, Take 1 tablet (8 mg total) by mouth every 8 (eight) hours as needed. (Patient not taking: Reported on 02/15/2022), Disp: 30 tablet, Rfl: 0   oxyCODONE (OXY IR/ROXICODONE) 5 MG immediate release tablet, Take 1 tablet (5 mg total) by mouth every 4 (four) hours as needed for severe pain., Disp: 30 tablet, Rfl: 0   Polyethylene Glycol 3350 (MIRALAX PO), Take by mouth as needed., Disp: , Rfl:    prochlorperazine (COMPAZINE) 10 MG tablet, Take 1 tablet (10 mg total) by mouth every 6 (six) hours as needed for nausea or vomiting. (Patient not taking: Reported on 02/15/2022), Disp: 30 tablet, Rfl: 0   valsartan-hydrochlorothiazide (DIOVAN-HCT) 160-12.5 MG tablet, Take 1 tablet by mouth daily., Disp: , Rfl:    vitamin C (ASCORBIC ACID) 500 MG tablet, Take 500 mg by mouth daily., Disp: , Rfl:   PHYSICAL EXAM: ECOG FS:1 - Symptomatic but completely ambulatory    Vitals:   03/21/22 1138  BP: (!) 112/56  Pulse: 88  Resp: 20  TempSrc: Oral  SpO2: 98%  Weight: 162 lb 3.2 oz (73.6 kg)   Physical Exam Vitals and nursing note reviewed.  Constitutional:      Appearance: She is well-developed. She is not ill-appearing or toxic-appearing.  HENT:     Head:  Normocephalic and atraumatic.     Right Ear: External ear normal.     Left Ear: External ear normal.     Nose: Nose normal.     Mouth/Throat:     Mouth: Mucous membranes are moist.  Eyes:     General: No scleral icterus.       Right eye: No discharge.        Left eye: No discharge.     Conjunctiva/sclera: Conjunctivae normal.  Neck:     Vascular: No JVD.  Cardiovascular:     Rate and Rhythm: Normal rate and regular rhythm.     Pulses: Normal pulses.     Heart sounds: Normal heart sounds.  Pulmonary:     Effort: Pulmonary effort is normal.     Breath sounds: Normal breath sounds.  Abdominal:     General: Bowel sounds are normal. There is no distension.     Palpations: Abdomen is soft. There is no mass.     Tenderness: There is abdominal tenderness. There is no left CVA tenderness or guarding.     Hernia: No hernia is present.     Comments: Ostomy in place left mid abdomen. No surrounding erythema.  Tenderness to palpation of RLQ. Right CVA tenderness.  Musculoskeletal:        General: Normal range of motion.     Cervical back: Normal range of motion.  Skin:    General: Skin is warm and dry.  Neurological:     Mental Status: She is oriented to person, place, and time.     GCS: GCS eye subscore is 4. GCS verbal subscore is 5. GCS motor subscore is 6.     Comments: Fluent speech, no facial droop.  Psychiatric:        Behavior: Behavior normal.        LABORATORY DATA: I have reviewed the data as listed    Latest Ref Rng & Units 03/21/2022   12:12 PM 03/20/2022    2:31 PM 02/15/2022    9:02 AM  CBC  WBC 4.0 - 10.5  K/uL 10.4  10.8  7.7   Hemoglobin 12.0 - 15.0 g/dL 9.2  9.7  12.1   Hematocrit 36.0 - 46.0 % 27.1  29.1  35.4   Platelets 150 - 400 K/uL 338  393  298         Latest Ref Rng & Units 03/21/2022   12:12 PM 03/20/2022    2:31 PM 02/15/2022    9:02 AM  CMP  Glucose 70 - 99 mg/dL 166  101  117   BUN 8 - 23 mg/dL 56  60  50   Creatinine 0.44 - 1.00 mg/dL  1.97  1.92  1.94   Sodium 135 - 145 mmol/L 134  137  135   Potassium 3.5 - 5.1 mmol/L 3.9  3.9  4.4   Chloride 98 - 111 mmol/L 99  102  100   CO2 22 - 32 mmol/L '28  25  27   '$ Calcium 8.9 - 10.3 mg/dL 9.5  9.4  9.7   Total Protein 6.5 - 8.1 g/dL 7.8  8.1  7.9   Total Bilirubin 0.3 - 1.2 mg/dL 0.3  0.3  0.3   Alkaline Phos 38 - 126 U/L 111  107  98   AST 15 - 41 U/L '12  14  11   '$ ALT 0 - 44 U/L '12  16  9        '$ RADIOGRAPHIC STUDIES (from last 24 hours if applicable) I have personally reviewed the radiological images as listed and agreed with the findings in the report. No results found.     ASSESSMENT & PLAN: Patient is a 66 y.o. 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 Lorenso Courier.  I have viewed most recent oncology note and ED work up from 03/20/22.   #) Hematuria- Patient is nontoxic appearing. She is afebrile, HDS. On exam she has tenderness to RLQ and right CVA tenderness. CT renal performed yesterday in the ED showing: right moderate to severe hydronephrosis and findings suggest possible high-grade obstruction of the distal right ureter caused by extrinsic compression or infiltration by the malignant neoplasm in pelvic cavity. I viewed imaging and agree with radiologist impression. CMP shows creatinine has been rising over the last 5 months. Her baseline appears to be around 1.0 and yesterday it was 1.92. BUN elevatd at 60 and GFR is low at 28. Rechecked labs today and creatinine higher at 1.97, GFR 27. Patient given dose of morphine and zofran for pain and nausea in the clinic.  #)Anemia- Hemoglobin today is 9.2 baseline appears to be 9-12. Had anemia during previous chemotherapy. No indication for emergent transfusion at this time.   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 Patient completed chemoradiation (cisplatin)  09/01/22. She had an MRI 02/01/22 that showed mass  centered at right side of rectosigmoid junction increased. Plan was for repeat imaging in 08/23.  Patient will need hospital admission for further work up of new right hydronephrosis with possible high-grade obstruction of right distal ureter with large soft tissue mass. I discussed case with hospitalist with Dr. Olevia Bowens who accepts patient for admission.  I attempted to consult urology however did not receive a call back. Admitting team made aware and will need to consult urology upon arrival.  Visit Diagnosis: 1. Carcinoma of presacral region [facility])   2. Port-A-Cath in place   3. Hematuria, unspecified type  No orders of the defined types were placed in this encounter.   All questions were answered. No barriers to learning was detected.  I have spent a total of 30 minutes minutes of face-to-face and non-face-to-face time, preparing to see the patient, obtaining and/or reviewing separately obtained history, performing a medically appropriate examination, counseling and educating the patient, ordering tests, documenting clinical information in the electronic health record, and care coordination (communications with other health care professionals or caregivers).    Thank you for allowing me to participate in the care of this patient.    Barrie Folk, PA-C Department of Hematology/Oncology [facility] Phone: [phone]  [phone]    03/21/2022 3:57 PM

## 2022-03-21 NOTE — Consult Note (Signed)
Urology Consult Note   Requesting Attending Physician:  Elmarie Shiley, MD Service Providing Consult: Urology  Consulting Attending: Dr. Jeffie Pollock   Reason for Consult:  hydronephrosis and hematuria  HPI: Mariah Schultz is seen in consultation for reasons noted above at the request of Regalado, Belkys A, MD for evaluation of AKI and right hydronephrosis.  This is a 66 y.o. 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 Originally presented 2002 with CT showing a posterior pelvic mass. Colonoscopy with biopsy showed poorly differentiated high-grade carcinoma. Now s/p chemo and radiation completed December 2002. Interval MRI 02/01/22 with enlarged mass. Presented to ED 03/20/22 with increasing pelvic pain and rectal pressure and hematuria. Repeat CT demonstrated moderate/severe right sided hydronephrosis with a pelvic tissue mass with interval growth.   Currently afebrile and hemodynamically stable. It appears the mass is completely encompassing the right ureter. Creatinine 1.9 from baseline of ~1.1. Urinalysis with large amount of blood but low concern for infection. It does appear that her 01/22/22 MRI shows some right sided ureteral dilation but exam was not extended to the abdomen so unclear appearance of kidneys at that time. Endorses some recent difficulty voiding and passage of small clots. No flank pain.   Past Medical History: Past Medical History:  Diagnosis Date   CAP (community acquired pneumonia) 09/10/2013   History of radiation therapy    pelvis 07/19/2021-09/01/2021  Dr Gery Pray   Hypertension    Papilloma of left breast    Papilloma of left breast 02/26/2019   Perforated appendicitis 03/19/2013    Past Surgical History:  Past Surgical History:  Procedure Laterality Date   ABDOMINAL HYSTERECTOMY     BREAST EXCISIONAL BIOPSY Left 02/26/2019   B9 Biopsy   BREAST LUMPECTOMY WITH RADIOACTIVE SEED  LOCALIZATION Left 02/26/2019   Procedure: LEFT BREAST LUMPECTOMY WITH RADIOACTIVE SEED LOCALIZATION;  Surgeon: Fanny Skates, MD;  Location: [place];  Service: General;  Laterality: Left;   IR IMAGING GUIDED PORT INSERTION  07/21/2021   LAPAROSCOPIC APPENDECTOMY N/A 02/25/2013   Procedure: APPENDECTOMY LAPAROSCOPIC;  Surgeon: Harl Bowie, MD;  Location: [place];  Service: General;  Laterality: N/A;   LAPAROSCOPY N/A 06/22/2021   Procedure: LAPAROSCOPY DIAGNOSTIC, DIVERTING LOOP COLOSTOMY, ANORECTAL EXAM UNDER ANESTHESIA, TRANSRECTAL CORE BIOPSIES.;  Surgeon: Lafonda Mosses, MD;  Location: [facility];  Service: Gynecology;  Laterality: N/A;    Medication: Current Facility-Administered Medications  Medication Dose Route Frequency Provider Last Rate Last Admin   0.9 %  sodium chloride infusion   Intravenous Continuous Regalado, Belkys A, MD       acetaminophen (TYLENOL) tablet 650 mg  650 mg Oral Q6H PRN Regalado, Belkys A, MD       Or   acetaminophen (TYLENOL) suppository 650 mg  650 mg Rectal Q6H PRN Regalado, Belkys A, MD       gabapentin (NEURONTIN) capsule 100 mg  100 mg Oral BID Regalado, Belkys A, MD       hydrALAZINE (APRESOLINE) injection 5 mg  5 mg Intravenous Q6H PRN Regalado, Belkys A, MD       HYDROmorphone (DILAUDID) injection 1 mg  1 mg Intravenous Q3H PRN Regalado, Belkys A, MD   1 mg at 03/21/22 1709   multivitamin with minerals tablet 1 tablet  1 tablet Oral Daily Regalado, Belkys A, MD       ondansetron (ZOFRAN) tablet 4 mg  4 mg Oral Q6H PRN Regalado, Cassie Freer, MD  Or   ondansetron (ZOFRAN) injection 4 mg  4 mg Intravenous Q6H PRN Regalado, Belkys A, MD       oxyCODONE (Oxy IR/ROXICODONE) immediate release tablet 5 mg  5 mg Oral Q4H PRN Regalado, Belkys A, MD       polyethylene glycol (MIRALAX / GLYCOLAX) packet 17 g  17 g Oral Daily Regalado, Belkys A, MD       senna-docusate (Senokot-S) tablet 1 tablet  1 tablet Oral QHS PRN Regalado, Belkys  A, MD        Allergies: Allergies  Allergen Reactions   Other Swelling    pneumonia vaccine   Pneumococcal Vac Polyvalent Other (See Comments)    Social History: Social History   Tobacco Use   Smoking status: Every Day    Packs/day: 1.00    Years: 40.00    Total pack years: 40.00    Types: Cigarettes   Smokeless tobacco: Never  Vaping Use   Vaping Use: Never used  Substance Use Topics   Alcohol use: Yes    Comment: occ   Drug use: No    Family History Family History  Problem Relation Age of Onset   Heart disease Father    Colon cancer Neg Hx    Breast cancer Neg Hx    Ovarian cancer Neg Hx    Pancreatic cancer Neg Hx    Prostate cancer Neg Hx    Endometrial cancer Neg Hx        Patient unaware    Review of Systems 10 systems were reviewed and are negative except as noted specifically in the HPI.  Objective   Vital signs in last 24 hours: BP (!) 131/91 (BP Location: Left Arm)   Pulse 80   Temp 98.4 F (36.9 C) (Oral)   SpO2 96%   Physical Exam General: NAD, A&O, resting, appropriate HEENT: North/AT, EOMI, MMM Pulmonary: Normal work of breathing Cardiovascular: HDS, adequate peripheral perfusion Abdomen: Soft, NTTP, obtunded but non-distended. GU: Voiding spontaneously, no CVA tenderness Extremities: warm and well perfused Neuro: Appropriate, no focal neurological deficits  Most Recent Labs: Lab Results  Component Value Date   WBC 10.4 03/21/2022   HGB 9.2 (L) 03/21/2022   HCT 27.1 (L) 03/21/2022   PLT 338 03/21/2022    Lab Results  Component Value Date   NA 134 (L) 03/21/2022   K 3.9 03/21/2022   CL 99 03/21/2022   CO2 28 03/21/2022   BUN 56 (H) 03/21/2022   CREATININE 1.97 (H) 03/21/2022   CALCIUM 9.5 03/21/2022   MG 1.7 08/23/2021    No results found for: "INR", "APTT"   Urine Culture: '@[id number] (laburin,org,[id number],[id number])@   IMAGING: CT Renal Stone Study  Result Date: 03/20/2022 CLINICAL DATA:  Flank pain, hematuria EXAM: CT  ABDOMEN AND PELVIS WITHOUT CONTRAST TECHNIQUE: Multidetector CT imaging of the abdomen and pelvis was performed following the standard protocol without IV contrast. RADIATION DOSE REDUCTION: This exam was performed according to the departmental dose-optimization program which includes automated exposure control, adjustment of the mA and/or kV according to patient size and/or use of iterative reconstruction technique. COMPARISON:  05/11/2021 FINDINGS: Lower chest: Small linear densities in the lower lung fields suggest scarring or subsegmental atelectasis. There is ectasia of bronchi. Hepatobiliary: Liver is enlarged measuring 19.6 cm in length. No focal abnormalities are seen. Gallbladder is unremarkable. Pancreas: There is atrophy and pancreas. No focal abnormalities are seen. Spleen: Unremarkable. Adrenals/Urinary Tract: Adrenals are unremarkable. There is no hydronephrosis on the left side. There is  moderate to marked right hydronephrosis. There is dilation of right ureter to the level of pelvic inlet. There are no opaque renal or ureteral stones. There is a soft tissue mass measuring 11.1 x 7 cm in size and pelvis extending more to the right. Left ureter is not dilated. Urinary bladder is not distended. Stomach/Bowel: Stomach is unremarkable. Small bowel loops are not dilated. The appendix is not seen. Colostomy is noted in left mid abdomen. Diverticula are seen in colon without signs of focal diverticulitis. Vascular/Lymphatic: Scattered arterial calcifications are seen. Reproductive: Uterus could not be distinctly identified. Other: There is no ascites or pneumoperitoneum. Small bilateral inguinal hernias containing fat are noted. Small umbilical hernia containing fat is seen. Musculoskeletal: There is minimal anterolisthesis at L4-L5 level. IMPRESSION: There is no evidence of intestinal obstruction or pneumoperitoneum. There is interval appearance of moderate to severe right hydronephrosis. There are no  demonstrable opaque renal or ureteral stones. There is 11.1 x 7 cm soft tissue mass in pelvic cavity extending more to the right. Findings suggest possible high-grade obstruction of the distal right ureter caused by extrinsic compression or infiltration by the malignant neoplasm in pelvic cavity. Diverticulosis of colon without signs of focal diverticulitis. 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 Other findings as described in the body of the report. Electronically Signed   By: Elmer Picker M.D.   On: 03/20/2022 14:42    ------  Assessment:  66 y.o. female with poorly differentiated carcinoma with progression and new right sided malignant ureteral obstruction with AKI and hematuria. No evidence of concurrent UTI.   Hydronephrosis - creatinine elevated from baseline, contralateral kidney well appearing. Unclear if mass has invaded the ureter vs extrinsic compression. 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 Hgb stable but down from mid-June.    Recommendations: - Please place 20Fr foley catheter, urology will come flush to evaluate clot burden - Keep patient NPO at midnight tonight. Discussed options and risks/benefits of stent vs nephrostomy tube. Will tentatively plan for cystoscopy with possible fulguration and attempted right ureteral stent placement. If stent is unsuccessful, discussed need for nephrostomy tube. - Continue to monitor Hgb  Thank you for this consult. Please contact the urology consult pager with any further questions/concerns.

## 2022-03-22 ENCOUNTER — Observation Stay (HOSPITAL_COMMUNITY): Payer: Medicare HMO | Admitting: Anesthesiology

## 2022-03-22 ENCOUNTER — Inpatient Hospital Stay (HOSPITAL_COMMUNITY): Payer: Medicare HMO

## 2022-03-22 ENCOUNTER — Encounter (HOSPITAL_COMMUNITY): Payer: Self-pay | Admitting: Internal Medicine

## 2022-03-22 ENCOUNTER — Encounter (HOSPITAL_COMMUNITY)
Admission: AD | Disposition: A | Discharge status: Home / Self Care | Payer: Self-pay | Source: Ambulatory Visit | Attending: Internal Medicine

## 2022-03-22 ENCOUNTER — Other Ambulatory Visit: Payer: Self-pay

## 2022-03-22 DIAGNOSIS — D62 Acute posthemorrhagic anemia: Secondary | ICD-10-CM | POA: Diagnosis present

## 2022-03-22 DIAGNOSIS — D630 Anemia in neoplastic disease: Secondary | ICD-10-CM | POA: Diagnosis present

## 2022-03-22 DIAGNOSIS — R1900 Intra-abdominal and pelvic swelling, mass and lump, unspecified site: Secondary | ICD-10-CM

## 2022-03-22 DIAGNOSIS — Z923 Personal history of irradiation: Secondary | ICD-10-CM | POA: Diagnosis not present

## 2022-03-22 DIAGNOSIS — C768 Malignant neoplasm of other specified ill-defined sites: Secondary | ICD-10-CM | POA: Diagnosis present

## 2022-03-22 DIAGNOSIS — N1832 Chronic kidney disease, stage 3b: Secondary | ICD-10-CM | POA: Diagnosis present

## 2022-03-22 DIAGNOSIS — N1339 Other hydronephrosis: Secondary | ICD-10-CM | POA: Diagnosis not present

## 2022-03-22 DIAGNOSIS — N179 Acute kidney failure, unspecified: Secondary | ICD-10-CM

## 2022-03-22 DIAGNOSIS — N1330 Unspecified hydronephrosis: Secondary | ICD-10-CM

## 2022-03-22 DIAGNOSIS — F17210 Nicotine dependence, cigarettes, uncomplicated: Secondary | ICD-10-CM | POA: Diagnosis present

## 2022-03-22 DIAGNOSIS — I10 Essential (primary) hypertension: Secondary | ICD-10-CM | POA: Diagnosis not present

## 2022-03-22 DIAGNOSIS — Z888 Allergy status to other drugs, medicaments and biological substances status: Secondary | ICD-10-CM | POA: Diagnosis not present

## 2022-03-22 DIAGNOSIS — Z8249 Family history of ischemic heart disease and other diseases of the circulatory system: Secondary | ICD-10-CM | POA: Diagnosis not present

## 2022-03-22 DIAGNOSIS — D509 Iron deficiency anemia, unspecified: Secondary | ICD-10-CM | POA: Diagnosis present

## 2022-03-22 DIAGNOSIS — D631 Anemia in chronic kidney disease: Secondary | ICD-10-CM | POA: Diagnosis present

## 2022-03-22 DIAGNOSIS — Z933 Colostomy status: Secondary | ICD-10-CM | POA: Diagnosis not present

## 2022-03-22 DIAGNOSIS — Z90710 Acquired absence of both cervix and uterus: Secondary | ICD-10-CM | POA: Diagnosis not present

## 2022-03-22 DIAGNOSIS — Z9221 Personal history of antineoplastic chemotherapy: Secondary | ICD-10-CM | POA: Diagnosis not present

## 2022-03-22 DIAGNOSIS — R310 Gross hematuria: Secondary | ICD-10-CM | POA: Diagnosis present

## 2022-03-22 DIAGNOSIS — N131 Hydronephrosis with ureteral stricture, not elsewhere classified: Secondary | ICD-10-CM | POA: Diagnosis present

## 2022-03-22 DIAGNOSIS — K5900 Constipation, unspecified: Secondary | ICD-10-CM | POA: Diagnosis not present

## 2022-03-22 DIAGNOSIS — Z79899 Other long term (current) drug therapy: Secondary | ICD-10-CM | POA: Diagnosis not present

## 2022-03-22 DIAGNOSIS — I129 Hypertensive chronic kidney disease with stage 1 through stage 4 chronic kidney disease, or unspecified chronic kidney disease: Secondary | ICD-10-CM | POA: Diagnosis present

## 2022-03-22 DIAGNOSIS — Z7982 Long term (current) use of aspirin: Secondary | ICD-10-CM | POA: Diagnosis not present

## 2022-03-22 HISTORY — PX: CYSTOSCOPY W/ URETERAL STENT PLACEMENT: SHX1429

## 2022-03-22 LAB — BASIC METABOLIC PANEL
Anion gap: 7 (ref 5–15)
BUN: 47 mg/dL — ABNORMAL HIGH (ref 8–23)
CO2: 24 mmol/L (ref 22–32)
Calcium: 8.6 mg/dL — ABNORMAL LOW (ref 8.9–10.3)
Chloride: 107 mmol/L (ref 98–111)
Creatinine, Ser: 1.7 mg/dL — ABNORMAL HIGH (ref 0.44–1.00)
GFR, Estimated: 33 mL/min — ABNORMAL LOW (ref >60)
Glucose, Bld: 119 mg/dL — ABNORMAL HIGH (ref 70–99)
Potassium: 4.1 mmol/L (ref 3.5–5.1)
Sodium: 138 mmol/L (ref 135–145)

## 2022-03-22 SURGERY — CYSTOSCOPY, WITH RETROGRADE PYELOGRAM AND URETERAL STENT INSERTION
Anesthesia: General | Laterality: Right

## 2022-03-22 MED ORDER — DEXAMETHASONE SODIUM PHOSPHATE 10 MG/ML IJ SOLN
INTRAMUSCULAR | Status: AC
  Filled 2022-03-22: qty 1

## 2022-03-22 MED ORDER — CEFAZOLIN SODIUM-DEXTROSE 2-3 GM-%(50ML) IV SOLR
INTRAVENOUS | Status: DC | PRN
  Administered 2022-03-22: 2 g via INTRAVENOUS

## 2022-03-22 MED ORDER — FENTANYL CITRATE (PF) 100 MCG/2ML IJ SOLN
INTRAMUSCULAR | Status: DC | PRN
  Administered 2022-03-22 (×4): 50 ug via INTRAVENOUS

## 2022-03-22 MED ORDER — LIDOCAINE HCL (PF) 2 % IJ SOLN
INTRAMUSCULAR | Status: AC
  Filled 2022-03-22: qty 5

## 2022-03-22 MED ORDER — MIDAZOLAM HCL 2 MG/2ML IJ SOLN: 0.5000 mg | Freq: Once | INTRAMUSCULAR | Status: DC | PRN

## 2022-03-22 MED ORDER — MIDAZOLAM HCL 2 MG/2ML IJ SOLN
INTRAMUSCULAR | Status: AC
  Filled 2022-03-22: qty 2

## 2022-03-22 MED ORDER — PROPOFOL 500 MG/50ML IV EMUL
INTRAVENOUS | Status: AC
  Filled 2022-03-22: qty 50

## 2022-03-22 MED ORDER — DEXAMETHASONE SODIUM PHOSPHATE 4 MG/ML IJ SOLN
INTRAMUSCULAR | Status: DC | PRN
  Administered 2022-03-22: 5 mg via INTRAVENOUS

## 2022-03-22 MED ORDER — CEFAZOLIN SODIUM 1 G IJ SOLR
INTRAMUSCULAR | Status: AC
  Filled 2022-03-22: qty 20

## 2022-03-22 MED ORDER — ONDANSETRON HCL 4 MG/2ML IJ SOLN
INTRAMUSCULAR | Status: AC
  Filled 2022-03-22: qty 2

## 2022-03-22 MED ORDER — FENTANYL CITRATE (PF) 100 MCG/2ML IJ SOLN
INTRAMUSCULAR | Status: AC
  Filled 2022-03-22: qty 2

## 2022-03-22 MED ORDER — ROCURONIUM BROMIDE 10 MG/ML (PF) SYRINGE
PREFILLED_SYRINGE | INTRAVENOUS | Status: DC | PRN
  Administered 2022-03-22: 70 mg via INTRAVENOUS

## 2022-03-22 MED ORDER — LACTATED RINGERS IV SOLN: INTRAVENOUS | Status: DC | PRN

## 2022-03-22 MED ORDER — OXYCODONE HCL 5 MG PO TABS
ORAL_TABLET | ORAL | Status: AC
  Filled 2022-03-22: qty 1

## 2022-03-22 MED ORDER — ONDANSETRON HCL 4 MG/2ML IJ SOLN
INTRAMUSCULAR | Status: DC | PRN
  Administered 2022-03-22: 4 mg via INTRAVENOUS

## 2022-03-22 MED ORDER — HYDROMORPHONE HCL 1 MG/ML IJ SOLN
1.0000 mg | Freq: Once | INTRAMUSCULAR | Status: AC
  Administered 2022-03-22: 1 mg via INTRAVENOUS
  Filled 2022-03-22: qty 1

## 2022-03-22 MED ORDER — LIDOCAINE 2% (20 MG/ML) 5 ML SYRINGE
INTRAMUSCULAR | Status: DC | PRN
  Administered 2022-03-22: 100 mg via INTRAVENOUS

## 2022-03-22 MED ORDER — OXYCODONE HCL 5 MG/5ML PO SOLN: 5.0000 mg | Freq: Once | ORAL | Status: AC | PRN

## 2022-03-22 MED ORDER — ACETAMINOPHEN 325 MG PO TABS: 325.0000 mg | ORAL_TABLET | ORAL | Status: DC | PRN

## 2022-03-22 MED ORDER — CHLORHEXIDINE GLUCONATE 0.12 % MT SOLN
15.0000 mL | Freq: Once | OROMUCOSAL | Status: AC
  Administered 2022-03-22: 15 mL via OROMUCOSAL

## 2022-03-22 MED ORDER — SUGAMMADEX SODIUM 200 MG/2ML IV SOLN
INTRAVENOUS | Status: DC | PRN
  Administered 2022-03-22: 200 mg via INTRAVENOUS

## 2022-03-22 MED ORDER — SODIUM CHLORIDE 0.9 % IR SOLN
Status: DC | PRN
  Administered 2022-03-22: 3000 mL

## 2022-03-22 MED ORDER — ENSURE ENLIVE PO LIQD
237.0000 mL | Freq: Two times a day (BID) | ORAL | Status: DC
  Administered 2022-03-23 – 2022-03-25 (×5): 237 mL via ORAL

## 2022-03-22 MED ORDER — IOHEXOL 300 MG/ML  SOLN
INTRAMUSCULAR | Status: DC | PRN
  Administered 2022-03-22: 21 mL

## 2022-03-22 MED ORDER — PROPOFOL 10 MG/ML IV BOLUS
INTRAVENOUS | Status: AC
  Filled 2022-03-22: qty 20

## 2022-03-22 MED ORDER — HYDROMORPHONE HCL 1 MG/ML IJ SOLN
0.2500 mg | INTRAMUSCULAR | Status: DC | PRN
  Administered 2022-03-22: 0.5 mg via INTRAVENOUS

## 2022-03-22 MED ORDER — HYDROMORPHONE HCL 1 MG/ML IJ SOLN
INTRAMUSCULAR | Status: AC
  Filled 2022-03-22: qty 1

## 2022-03-22 MED ORDER — PROPOFOL 10 MG/ML IV BOLUS
INTRAVENOUS | Status: DC | PRN
  Administered 2022-03-22: 180 mg via INTRAVENOUS

## 2022-03-22 MED ORDER — PROMETHAZINE HCL 25 MG/ML IJ SOLN: 6.2500 mg | INTRAMUSCULAR | Status: DC | PRN

## 2022-03-22 MED ORDER — ROCURONIUM BROMIDE 10 MG/ML (PF) SYRINGE
PREFILLED_SYRINGE | INTRAVENOUS | Status: AC
  Filled 2022-03-22: qty 10

## 2022-03-22 MED ORDER — LACTATED RINGERS IV SOLN: INTRAVENOUS | Status: DC

## 2022-03-22 MED ORDER — ACETAMINOPHEN 160 MG/5ML PO SOLN: 325.0000 mg | ORAL | Status: DC | PRN

## 2022-03-22 MED ORDER — MEPERIDINE HCL 50 MG/ML IJ SOLN: 6.2500 mg | INTRAMUSCULAR | Status: DC | PRN

## 2022-03-22 MED ORDER — OXYCODONE HCL 5 MG PO TABS
5.0000 mg | ORAL_TABLET | Freq: Once | ORAL | Status: AC | PRN
  Administered 2022-03-22: 5 mg via ORAL

## 2022-03-22 MED ORDER — STERILE WATER FOR IRRIGATION IR SOLN
Status: DC | PRN
  Administered 2022-03-22: 3000 mL

## 2022-03-22 SURGICAL SUPPLY — 24 items
BAG DRN RND TRDRP ANRFLXCHMBR (UROLOGICAL SUPPLIES)
BAG URINE DRAIN 2000ML AR STRL (UROLOGICAL SUPPLIES) IMPLANT
BAG URO CATCHER STRL LF (MISCELLANEOUS) ×2 IMPLANT
CATH FOLEY 2WAY SLVR  5CC 18FR (CATHETERS)
CATH FOLEY 2WAY SLVR 5CC 18FR (CATHETERS) IMPLANT
CATH URETERAL DUAL LUMEN 10F (MISCELLANEOUS) ×1 IMPLANT
CATH URETL OPEN END 6FR 70 (CATHETERS) ×2 IMPLANT
CLOTH BEACON ORANGE TIMEOUT ST (SAFETY) ×2 IMPLANT
DRAPE FOOT SWITCH (DRAPES) ×2 IMPLANT
ELECT REM PT RETURN 15FT ADLT (MISCELLANEOUS) ×2 IMPLANT
GLOVE BIO SURGEON STRL SZ7.5 (GLOVE) ×2 IMPLANT
GOWN STRL REUS W/ TWL XL LVL3 (GOWN DISPOSABLE) ×1 IMPLANT
GOWN STRL REUS W/TWL XL LVL3 (GOWN DISPOSABLE) ×2
GUIDEWIRE STR DUAL SENSOR (WIRE) ×2 IMPLANT
KIT TURNOVER KIT A (KITS) IMPLANT
LOOP CUT BIPOLAR 24F LRG (ELECTROSURGICAL) IMPLANT
MANIFOLD NEPTUNE II (INSTRUMENTS) ×2 IMPLANT
PACK CYSTO (CUSTOM PROCEDURE TRAY) ×2 IMPLANT
PLUG CATH AND CAP STER (CATHETERS) IMPLANT
STENT URET 6FRX24 CONTOUR (STENTS) ×1 IMPLANT
SYR TOOMEY IRRIG 70ML (MISCELLANEOUS) ×2
SYRINGE TOOMEY IRRIG 70ML (MISCELLANEOUS) IMPLANT
TUBING CONNECTING 10 (TUBING) ×2 IMPLANT
TUBING UROLOGY SET (TUBING) ×2 IMPLANT

## 2022-03-22 NOTE — Anesthesia Preprocedure Evaluation (Addendum)
Anesthesia Evaluation  Patient identified by MRN, date of birth, ID band Patient awake    Reviewed: Allergy & Precautions, H&P , NPO status , Patient's Chart, lab work & pertinent test results  Airway Mallampati: II  TM Distance: >3 FB Neck ROM: Full    Dental no notable dental hx. (+) Dental Advisory Given   Pulmonary Current Smoker and Patient abstained from smoking.,    Pulmonary exam normal        Cardiovascular hypertension, Pt. on medications Normal cardiovascular exam     Neuro/Psych negative neurological ROS  negative psych ROS   GI/Hepatic negative GI ROS, Neg liver ROS,   Endo/Other  negative endocrine ROS  Renal/GU ARFRenal disease     Musculoskeletal   Abdominal   Peds  Hematology  (+) Blood dyscrasia, anemia ,   Anesthesia Other Findings   Reproductive/Obstetrics  Breast cancer                            Anesthesia Physical Anesthesia Plan  ASA: 3  Anesthesia Plan: General   Post-op Pain Management: Tylenol PO (pre-op)*   Induction: Intravenous  PONV Risk Score and Plan: 2 and Ondansetron, Dexamethasone and Treatment may vary due to age or medical condition  Airway Management Planned: Oral ETT  Additional Equipment: None  Intra-op Plan:   Post-operative Plan: Extubation in OR  Informed Consent: I have reviewed the patients History and Physical, chart, labs and discussed the procedure including the risks, benefits and alternatives for the proposed anesthesia with the patient or authorized representative who has indicated his/her understanding and acceptance.     Dental advisory given  Plan Discussed with: CRNA, Anesthesiologist and Surgeon  Anesthesia Plan Comments:       Anesthesia Quick Evaluation

## 2022-03-22 NOTE — Plan of Care (Signed)
Pt reporting pain 9/10 this am.  MD notified and additional one time dose administered per [facility].  Problem: Pain Managment: Goal: General experience of comfort will improve Outcome: Not Progressing

## 2022-03-22 NOTE — Progress Notes (Signed)
Initial Nutrition Assessment  DOCUMENTATION CODES:   Not applicable  INTERVENTION:  Encourage adequate PO intake Ensure Enlive po BID, each supplement provides 350 kcal and 20 grams of protein. MVI with minerals daily "High Calorie, High Protein Nutrition Therapy" handout added to AVS  NUTRITION DIAGNOSIS:   Increased nutrient needs related to acute illness, cancer and cancer related treatments as evidenced by estimated needs.  GOAL:   Patient will meet greater than or equal to 90% of their needs  MONITOR:   PO intake, Supplement acceptance, Labs, Weight trends, I & O's  REASON FOR ASSESSMENT:   Malnutrition Screening Tool    ASSESSMENT:   Pt admitted with back pain and hematuria. 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  Urology following. Pt with hydronephrosis and hematuria as mass is completely encompassing the R ureter. S/p cystoscopy with bilateral retrograde pyelogram/R ureteral stent placement  Spoke with pt at bedside, noted to be uncomfortable d/t increase in pain. Reached out to nurse. Pt's husband at bedside assisting with nutrition history. She reports that for the past month she has had very poor appetite and PO intake, accompanied by nausea, fatigue and sensitivity to smells. She has been trying to drink nutrition supplements up to 2 times daily. Tries to keep [employer] tea, Ensure, and water at bedside every night. Discussed ways to combat sensitivity to smells such as eating foods that do not require heat to prepare and avoiding food preparation areas. She also c/o early satiety. Recommend eating smaller, more frequent meals.   Pt endorses a minimal wt loss of about 6 lbs within the last month. Reviewed wt history. It appears her wt was beginning to increase between December and May then began slowly decreasing. Noted a 4% wt  loss from 05/04-07/18 which is not clinically significant for time frame however is concerning given poor PO intake. Will continue to monitor throughout admission.  Pt reports none to minimal colostomy output which she attributes to limited PO intake. Has been given miralax to help with output but will monitor for improvements once able to take PO again.  Medications: MVI, miralax, PRN zofran  Labs: BUN 47, Cr 1.07, Ca 8.6, GFR 33   NUTRITION - FOCUSED PHYSICAL EXAM:  Flowsheet Row Most Recent Value  Orbital Region No depletion  Upper Arm Region No depletion  Thoracic and Lumbar Region No depletion  Buccal Region No depletion  Temple Region No depletion  Clavicle Bone Region No depletion  Clavicle and Acromion Bone Region No depletion  Scapular Bone Region No depletion  Dorsal Hand No depletion  Patellar Region Mild depletion  Anterior Thigh Region Mild depletion  Posterior Calf Region No depletion  Edema (RD Assessment) None  Hair Reviewed  Eyes Reviewed  Mouth Reviewed  Skin Reviewed  Nails Reviewed       Diet Order:   Diet Order             Diet regular Room service appropriate? Yes; Fluid consistency: Thin  Diet effective now                   EDUCATION NEEDS:   Education needs have been addressed  Skin:  Skin Assessment: Reviewed RN Assessment  Last BM:  7/17 (mucus thru colostomy-per RN)  Height:   Ht Readings from Last 1 Encounters:  03/22/22 '5\' 4"'$  (1.626 m)   Weight:   Wt Readings from Last 1 Encounters:  03/22/22 73.5 kg   BMI:  Body mass index is 27.81 kg/m.  Estimated Nutritional Needs:   Kcal:  1800-2000  Protein:  90-105g  Fluid:  >/=1.8L  Clayborne Dana, RDN, LDN Clinical Nutrition

## 2022-03-22 NOTE — Progress Notes (Signed)
Called patients husband, Percell Miller, and updated him on patients timeframe for surgery.  Informed him right now surgery is scheduled for 1600 and Dr.Bell would call him when surgery is complete & patient would go to recovery briefly after surgery before returning to her room on [address],1603.  Husband voiced understanding.

## 2022-03-22 NOTE — Transfer of Care (Signed)
Immediate Anesthesia Transfer of Care Note  Patient: Mariah Schultz  Procedure(s) Performed: CYSTOSCOPY WITH BILATERAL RETROGRADE PYELOGRAM/ RIGHT URETERAL STENT PLACEMENT (Right)  Patient Location: PACU  Anesthesia Type:General  Level of Consciousness: awake, alert , oriented and patient cooperative  Airway & Oxygen Therapy: Patient Spontanous Breathing and Patient connected to face mask  Post-op Assessment: Report given to RN and Post -op Vital signs reviewed and stable  Post vital signs: Reviewed and stable  Last Vitals:  Vitals Value Taken Time  BP 126/68 03/22/22 1710  Temp    Pulse 89 03/22/22 1713  Resp 23 03/22/22 1713  SpO2 93 % 03/22/22 1713  Vitals shown include unvalidated device data.  Last Pain:  Vitals:   03/22/22 1357  TempSrc:   PainSc: 1       Patients Stated Pain Goal: 0 (03/83/33 8329)  Complications: No notable events documented.

## 2022-03-22 NOTE — Progress Notes (Addendum)
PROGRESS NOTE    [name]  HCW:[id number] DOB: [birth year] DOA: 03/21/2022 PCP: Seward Carol, MD   Brief Narrative: Mariah Schultz is a 66 y.o. 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  Immunophenotype type not consistent with colorectal carcinoma.  06/2021 underwent laparoscopic diagnostic procedure with diverting loop colostomy and and anorectal exam under anesthesia.  Pathology results were consistent with a squamous cell carcinoma of unclear etiology. She underwent radiation and chemotherapy which were completed December 2022.  Interval MRI 02/01/2022 showed mildly enlarged mass measuring 6 x 4 cm.  She was going to have a repeated MRI in August 2023, she presented to the ED on 7/16 complaining of back pain, abdominal pain and rectal pressure that  started several weeks ago, getting worse.  She also presented with hematuria that  started 2 days prior to admission.  CT renal protocol  performed on 7/16: There is no evidence of intestinal obstruction or pneumoperitoneum. There is interval appearance of moderate to severe right hydronephrosis. There are no demonstrable opaque renal or ureteral stones. There is 11.1 x 7 cm soft tissue mass in pelvic cavity extending more to the right. Findings suggest possible high-grade obstruction of the distal right ureter caused by extrinsic compression or infiltration by the malignant neoplasm in pelvic cavity.  Diverticulosis of colon without signs of focal diverticulitis. 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   Labs: Sodium 134, BUN 56, creatinine 1.9, sodium 134, hemoglobin 9.2, white blood cell 10.4, UA turbid with more  than 50 red blood cell.  Patient admitted with hematuria, uncontrol back pain, abdominal pain in setting enlarging pelvis mass, causing obstruction right ureter and invading urinary bladder. Urology consulted plan for cystoscopy, possible fulguration bladder mass, stent placement.   Assessment & Plan:   Principal Problem:   AKI (acute kidney injury) ([place]) Active Problems:   Mass of pelvis   History of radiation therapy   Hematuria   Normocytic anemia   Hydronephrosis, right  1-Hematuria; Mass likely infiltrating bladder.  Urology Consulted. Dr Roni Bread recommend cystoscopy, possible fulguration and stent placement.  IV fluids Follow urine culture.  Hold aspirin.  Foley catheter placed.   2-CKD stage IIIb ; in setting obstructive uropathy In setting of pelvis mass obstructing right ureter.  IV fluids. Cr two months ago 1.2, one month ago 1.9. presents with cr 1.9 Dr Roni Bread will see patient in consultation,. He will evaluate patient and decide if patient needs nephrostomy tube or cystoscopy.  Cr trending down. Might have component of AKI.   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  Dr.  Lorenso Courier will see patient in consultation.  Continue with IV dilaudid PRN   4-Anemia; Anemia panel; Normal iron, B12.  Anemia of chronic diseases, and malignancy.  Monitor hb in setting of hematuria.      5-HTN; Hold Diovan due to renal function. PRN hydralazine.     Estimated body mass index is 27.81 kg/m as calculated from the following:   Height as of this encounter: '5\' 4"'$  (1.626 m).   Weight as of this encounter: 73.5 kg.   DVT prophylaxis: SCD Code Status: Full code Family Communication: Care discussed with husband  Disposition  Plan:  Status is: Inpatient Remains inpatient appropriate because: management of hematuria, back pain     Consultants:  Urology Dr Lorenso Courier  Procedures:    Antimicrobials:     Subjective: She is sleepy, just got pain medication. Dilaudid helps with pain.   Objective: Vitals:   03/22/22 0058 03/22/22 0530 03/22/22 1131 03/22/22 1351  BP: 103/72 113/77  115/65  Pulse: 81 80  74  Resp: '18 18  15  '$ Temp: 98.8 F (37.1 C) 98.9 F (37.2 C)  98.6 F (37 C)  TempSrc: Oral Oral  Oral  SpO2: 94% 94%  98%  Weight:   73.5 kg   Height:   '5\' 4"'$  (1.626 m)     Intake/Output Summary (Last 24 hours) at 03/22/2022 1454 Last data filed at 03/22/2022 0300 Gross per 24 hour  Intake 100 ml  Output 450 ml  Net -350 ml   Filed Weights   03/22/22 1131  Weight: 73.5 kg    Examination:  General exam: Appears calm and comfortable  Respiratory system: Clear to auscultation. Respiratory effort normal. Cardiovascular system: S1 & S2 heard, RRR.  Gastrointestinal system: Abdomen is nondistended, soft and nontender. No organomegaly or masses felt. Normal bowel sounds heard. Central nervous system: Alert and oriented. Extremities: Symmetric 5 x 5 power.    Data Reviewed: I have personally reviewed following labs and imaging studies  CBC: Recent Labs  Lab 03/20/22 1431 03/21/22 1212  WBC 10.8* 10.4  NEUTROABS 8.9* 8.7*  HGB 9.7* 9.2*  HCT 29.1* 27.1*  MCV 92.7 90.3  PLT 393 703   Basic Metabolic Panel: Recent Labs  Lab 03/20/22 1431 03/21/22 1212 03/22/22 1058  NA 137 134* 138  K 3.9 3.9 4.1  CL 102 99 107  CO2 '25 28 24  '$ GLUCOSE 101* 166* 119*  BUN 60* 56* 47*  CREATININE 1.92* 1.97* 1.70*  CALCIUM 9.4 9.5 8.6*   GFR: Estimated Creatinine Clearance: 31.5 mL/min (A) (by C-G formula based on SCr of 1.7 mg/dL (H)). Liver Function Tests: Recent Labs  Lab 03/20/22 1431 03/21/22 1212  AST 14* 12*  ALT 16 12  ALKPHOS 107 111  BILITOT 0.3 0.3  PROT 8.1 7.8  ALBUMIN 3.1* 3.6   No results for input(s): "LIPASE", "AMYLASE" in the last 168 hours. No results for input(s): "AMMONIA" in the last 168 hours. Coagulation Profile: No results for  input(s): "INR", "PROTIME" in the last 168 hours. Cardiac Enzymes: No results for input(s): "CKTOTAL", "CKMB", "CKMBINDEX", "TROPONINI" in the last 168 hours. BNP (last 3 results) No results for input(s): "PROBNP" in the last 8760 hours. HbA1C: No results for input(s): "HGBA1C" in the last 72 hours. CBG: No results for input(s): "GLUCAP" in the last 168 hours. Lipid Profile: No results for input(s): "CHOL", "HDL", "LDLCALC", "TRIG", "CHOLHDL", "LDLDIRECT" in the last 72 hours. Thyroid Function Tests: No results for input(s): "TSH", "T4TOTAL", "FREET4", "T3FREE", "THYROIDAB" in the last 72 hours. Anemia Panel: Recent Labs    03/21/22 1647  VITAMINB12 866  FOLATE >40.0  FERRITIN 268  TIBC 272  IRON 29  RETICCTPCT 1.5   Sepsis Labs: No results for input(s): "PROCALCITON", "LATICACIDVEN" in the last 168 hours.  No results found for this or any previous visit (from the past 240 hour(s)).       Radiology Studies: No results found.      Scheduled Meds:  gabapentin  100 mg Oral BID   multivitamin with minerals  1 tablet Oral Daily   polyethylene glycol  17 g  Oral Daily   Continuous Infusions:  sodium chloride 100 mL/hr at 03/22/22 1333     LOS: 0 days    Time spent: 35 Minutes.     Elmarie Shiley, MD [facility]   If 7PM-7AM, please contact night-coverage [email or web address]  03/22/2022, 2:54 PM

## 2022-03-22 NOTE — Discharge Instructions (Signed)

## 2022-03-22 NOTE — Progress Notes (Signed)
Unable to assess patient as she was undergoing urology procedure. Will re-address tomorrow. Agree with urological intervention, continue to trend Cr.   Ledell Peoples, MD Department of Hematology/Oncology [place] [facility] Phone: [phone] Pager: [phone] Email: [email or web address]

## 2022-03-22 NOTE — Anesthesia Postprocedure Evaluation (Signed)
Anesthesia Post Note  Patient: Mariah Schultz  Procedure(s) Performed: CYSTOSCOPY WITH BILATERAL RETROGRADE PYELOGRAM/ RIGHT URETERAL STENT PLACEMENT (Right)     Patient location during evaluation: PACU Anesthesia Type: General Level of consciousness: awake and alert Pain management: pain level controlled Vital Signs Assessment: post-procedure vital signs reviewed and stable Respiratory status: spontaneous breathing, nonlabored ventilation and respiratory function stable Cardiovascular status: stable and blood pressure returned to baseline Anesthetic complications: no   No notable events documented.  Last Vitals:  Vitals:   03/22/22 1730 03/22/22 1745  BP: (!) 138/99 (!) 131/107  Pulse: 81 77  Resp: 18 (!) 23  Temp: 36.7 C   SpO2: 96% 93%    Last Pain:  Vitals:   03/22/22 1730  TempSrc:   PainSc: [place]

## 2022-03-22 NOTE — Anesthesia Procedure Notes (Signed)
Procedure Name: Intubation Date/Time: 03/22/2022 4:35 PM  Performed by: Claudia Desanctis, CRNAPre-anesthesia Checklist: Patient identified, Emergency Drugs available, Suction available and Patient being monitored Patient Re-evaluated:Patient Re-evaluated prior to induction Oxygen Delivery Method: Circle system utilized Preoxygenation: Pre-oxygenation with 100% oxygen Induction Type: IV induction Ventilation: Mask ventilation without difficulty Laryngoscope Size: Miller and 3 Grade View: Grade I Tube type: Oral Tube size: 7.5 mm Number of attempts: 1 Airway Equipment and Method: Stylet Placement Confirmation: ETT inserted through vocal cords under direct vision, positive ETCO2 and breath sounds checked- equal and bilateral Secured at: 22 cm Tube secured with: Tape Dental Injury: Teeth and Oropharynx as per pre-operative assessment

## 2022-03-22 NOTE — Progress Notes (Addendum)
Urology Progress Note   19F with large pelvic mass and right ureteral obstruction with hematuria  Subjective: NAEON. Catheter flushed yesterday evening with return of small clot burden, urine red without clot.  She reports less abdominal and flank pain.   Objective: Vital signs in last 24 hours: Temp:  [98.4 F (36.9 C)-98.9 F (37.2 C)] 98.9 F (37.2 C) (07/18 0530) Pulse Rate:  [80-91] 80 (07/18 0530) Resp:  [18-20] 18 (07/18 0530) BP: (103-131)/(56-91) 113/77 (07/18 0530) SpO2:  [94 %-98 %] 94 % (07/18 0530) Weight:  [73.6 kg] 73.6 kg (07/17 1138)  Intake/Output from previous day: 07/17 0701 - 07/18 0700 In: 100  Out: 450 [Urine:450] Intake/Output this shift: Total I/O In: -  Out: 450 [Urine:450]  Physical Exam:  General: Alert and oriented CV: Regular rate Lungs: No increased work of breathing Abdomen:  Soft, appropriately tender. GU: Foley in place draining red tinged urine  Ext: NT, No erythema  Lab Results: Recent Labs    03/20/22 1431 03/21/22 1212  HGB 9.7* 9.2*  HCT 29.1* 27.1*   Recent Labs    03/20/22 1431 03/21/22 1212  NA 137 134*  K 3.9 3.9  CL 102 99  CO2 25 28  GLUCOSE 101* 166*  BUN 60* 56*  CREATININE 1.92* 1.97*  CALCIUM 9.4 9.5    Studies/Results: CT Renal Stone Study  Result Date: 03/20/2022 CLINICAL DATA:  Flank pain, hematuria EXAM: CT ABDOMEN AND PELVIS WITHOUT CONTRAST TECHNIQUE: Multidetector CT imaging of the abdomen and pelvis was performed following the standard protocol without IV contrast. RADIATION DOSE REDUCTION: This exam was performed according to the departmental dose-optimization program which includes automated exposure control, adjustment of the mA and/or kV according to patient size and/or use of iterative reconstruction technique. COMPARISON:  05/11/2021 FINDINGS: Lower chest: Small linear densities in the lower lung fields suggest scarring or subsegmental atelectasis. There is ectasia of bronchi. Hepatobiliary:  Liver is enlarged measuring 19.6 cm in length. No focal abnormalities are seen. Gallbladder is unremarkable. Pancreas: There is atrophy and pancreas. No focal abnormalities are seen. Spleen: Unremarkable. Adrenals/Urinary Tract: Adrenals are unremarkable. There is no hydronephrosis on the left side. There is moderate to marked right hydronephrosis. There is dilation of right ureter to the level of pelvic inlet. There are no opaque renal or ureteral stones. There is a soft tissue mass measuring 11.1 x 7 cm in size and pelvis extending more to the right. Left ureter is not dilated. Urinary bladder is not distended. Stomach/Bowel: Stomach is unremarkable. Small bowel loops are not dilated. The appendix is not seen. Colostomy is noted in left mid abdomen. Diverticula are seen in colon without signs of focal diverticulitis. Vascular/Lymphatic: Scattered arterial calcifications are seen. Reproductive: Uterus could not be distinctly identified. Other: There is no ascites or pneumoperitoneum. Small bilateral inguinal hernias containing fat are noted. Small umbilical hernia containing fat is seen. Musculoskeletal: There is minimal anterolisthesis at L4-L5 level. IMPRESSION: There is no evidence of intestinal obstruction or pneumoperitoneum. There is interval appearance of moderate to severe right hydronephrosis. There are no demonstrable opaque renal or ureteral stones. There is 11.1 x 7 cm soft tissue mass in pelvic cavity extending more to the right. Findings suggest possible high-grade obstruction of the distal right ureter caused by extrinsic compression or infiltration by the malignant neoplasm in pelvic cavity. Diverticulosis of colon without signs of focal diverticulitis. 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 Other findings as described in the body of the report. Electronically Signed  By: Elmer Picker M.D.   On: 03/20/2022 14:42    Assessment/Plan:  66 y.o. female with poorly differentiated  carcinoma with ureteral obstruction (right) and hematuria  - Continue foley catheter - OR today for cystoscopy, possible fulguration, stent placement - If unable to place stent, will require nephrostomy tube.  I have reviewed the risks of bleeding, infection, injury to urinary structures, inability to place a stent leading to the need for a nephrostomy tube, thrombotic events and anesthetic complications.   Dispo: floor   LOS: 0 days

## 2022-03-22 NOTE — Op Note (Signed)
Operative Note  Preoperative diagnosis:  1.  Gross hematuria 2.  Right hydronephrosis secondary to pelvic mass  Postoperative diagnosis: 1.  Gross hematuria 2.  Right hydronephrosis secondary to pelvic mass  Procedure(s): 1.  Cystoscopy with clot evacuation 2.  Bilateral retrograde pyelogram 3.  Right ureteral stent placement  Surgeon: Link Snuffer, MD  Assistants: None  Anesthesia: General  Complications: None immediate  EBL: Minimal  Specimens: 1.  None  Drains/Catheters: 1.  6 x 24 double-J ureteral stent on the right  Intraoperative findings: 1.  Normal urethra 2.  Bladder mucosa with some mass effect underlying the right posterior bladder but no obvious tumor invasion into the bladder.  No bladder masses. 3.  Left retrograde pyelogram normal without any filling defect except for some moving filling defects consistent with air bubbles. 4.  There was some hematuria effluxing from the right ureteral orifice.  Right retrograde pyelogram revealed a distal filling defect and several centimeter area of narrowing consistent with known pelvic mass compressing the ureter with possible invasion.  There was upstream hydronephrosis.  Ureter was tight but able to place a 6 x 24 double-J ureteral stent.  Indication: 66 year old female with poorly differentiated carcinoma with right ureteral obstruction and hematuria presents for previously mentioned operation.  Description of procedure:  The patient was identified and consent was obtained.  The patient was taken to the operating room and placed in the supine position.  The patient was placed under general anesthesia.  Perioperative antibiotics were administered.  The patient was placed in dorsal lithotomy.  Patient was prepped and draped in a standard sterile fashion and a timeout was performed.  A 26 French resectoscope with a visual obturator in place was advanced into the urethra and into the bladder.  Clot was evacuated.  I  reinspected the bladder mucosa and there was some bloody E flux from the right ureteral orifice.  Bladder mucosa was free of any obvious tumor.  There was some mass effect underlying the right bladder wall but no obvious invasion of malignancy into the bladder.  I exchanged the scope for a 21 French rigid cystoscope.  I advanced a sensor wire up the right ureter and into the kidney under fluoroscopic guidance.  Dual-lumen ureteral catheter was advanced over the wire under fluoroscopic guidance into the distal ureter and a retrograde pyelogram was performed through the second lumen with findings noted above.  I withdrew the ureteral catheter and backloaded the wire onto rigid cystoscope and advanced that into the bladder followed by routine placement of a 6 x 24 double-J ureteral stent.  Fluoroscopy confirmed proximal placement and direct visualization confirmed a good coil within the bladder.  I cannulated the left ureteral orifice with an open-ended ureteral catheter and retrograde pyelogram was performed with findings noted above.  There was no obvious abnormality except for some filling defects caused by air bubbles.  There was no hydronephrosis.  I drained the bladder and withdrew the scope.  Patient tolerated the procedure well was stable postoperative.  Plan: Patient will need a stent exchange in 3 to 6 months.  If stent fails secondary to the extrinsic compression, nephrostomy tube will be recommended.

## 2022-03-22 NOTE — Interval H&P Note (Signed)
History and Physical Interval Note:  03/22/2022 4:09 PM  Mariah Schultz  has presented today for surgery, with the diagnosis of PELVIC MASS, HEMATURIA, OBSTRUCTION.  The various methods of treatment have been discussed with the patient and family. After consideration of risks, benefits and other options for treatment, the patient has consented to  Procedure(s): CYSTOSCOPY WITH RETROGRADE PYELOGRAM/URETERAL STENT PLACEMENT (Right) CYSTOSCOPY WITH FULGERATION, POSSIBLE BIOPSY (Right) as a surgical intervention.  The patient's history has been reviewed, patient examined, no change in status, stable for surgery.  I have reviewed the patient's chart and labs.  Questions were answered to the patient's satisfaction.    Also bilateral retrograde pyelogram   Marton Redwood, III

## 2022-03-23 ENCOUNTER — Encounter (HOSPITAL_COMMUNITY): Payer: Self-pay | Admitting: Urology

## 2022-03-23 DIAGNOSIS — R1900 Intra-abdominal and pelvic swelling, mass and lump, unspecified site: Secondary | ICD-10-CM

## 2022-03-23 DIAGNOSIS — N1330 Unspecified hydronephrosis: Secondary | ICD-10-CM | POA: Diagnosis not present

## 2022-03-23 DIAGNOSIS — R310 Gross hematuria: Secondary | ICD-10-CM

## 2022-03-23 DIAGNOSIS — N179 Acute kidney failure, unspecified: Secondary | ICD-10-CM | POA: Diagnosis not present

## 2022-03-23 LAB — CBC
HCT: 25.7 % — ABNORMAL LOW (ref 36.0–46.0)
Hemoglobin: 8.4 g/dL — ABNORMAL LOW (ref 12.0–15.0)
MCH: 31.1 pg (ref 26.0–34.0)
MCHC: 32.7 g/dL (ref 30.0–36.0)
MCV: 95.2 fL (ref 80.0–100.0)
Platelets: 300 10*3/uL (ref 150–400)
RBC: 2.7 MIL/uL — ABNORMAL LOW (ref 3.87–5.11)
RDW: 14.4 % (ref 11.5–15.5)
WBC: 11.4 10*3/uL — ABNORMAL HIGH (ref 4.0–10.5)
nRBC: 0 % (ref 0.0–0.2)

## 2022-03-23 LAB — BASIC METABOLIC PANEL
Anion gap: 7 (ref 5–15)
BUN: 41 mg/dL — ABNORMAL HIGH (ref 8–23)
CO2: 24 mmol/L (ref 22–32)
Calcium: 8.9 mg/dL (ref 8.9–10.3)
Chloride: 110 mmol/L (ref 98–111)
Creatinine, Ser: 1.66 mg/dL — ABNORMAL HIGH (ref 0.44–1.00)
GFR, Estimated: 34 mL/min — ABNORMAL LOW (ref >60)
Glucose, Bld: 133 mg/dL — ABNORMAL HIGH (ref 70–99)
Potassium: 4.6 mmol/L (ref 3.5–5.1)
Sodium: 141 mmol/L (ref 135–145)

## 2022-03-23 LAB — URINE CULTURE: Culture: <10000 — AB

## 2022-03-23 MED ORDER — CHLORHEXIDINE GLUCONATE CLOTH 2 % EX PADS
6.0000 | MEDICATED_PAD | Freq: Every day | CUTANEOUS | Status: DC
Start: 2022-03-23 — End: 2022-03-25
  Administered 2022-03-23 – 2022-03-25 (×3): 6 via TOPICAL

## 2022-03-23 MED ORDER — HYDROMORPHONE HCL 1 MG/ML IJ SOLN
1.0000 mg | INTRAMUSCULAR | Status: DC | PRN
  Administered 2022-03-23 – 2022-03-25 (×8): 1 mg via INTRAVENOUS
  Filled 2022-03-23 (×8): qty 1

## 2022-03-23 MED ORDER — SODIUM CHLORIDE 0.9 % IV SOLN: INTRAVENOUS | Status: DC

## 2022-03-23 NOTE — Progress Notes (Signed)
Brief Hematology/Oncology Progress Note  Clinical Summary:  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  Interval History: -- Yesterday patient underwent a cystoscopy with bilateral retrograde pyelogram and right ureteral stent placement. --Patient is having hematuria with drop in hemoglobin down to 8.4 today --Clear progression of mass on CT scan. --Today discussed steps moving forward with patient and her daughter --Pain remains quite difficult though she is responding well to IV as needed Dilaudid.  O:  Vitals:   03/23/22 0609 03/23/22 1427  BP: (!) 150/84 117/76  Pulse: 70 77  Resp: 16 16  Temp: 97.8 F (36.6 C) 97.9 F (36.6 C)  SpO2: 92% 96%      Latest Ref Rng & Units 03/23/2022    5:00 AM 03/22/2022   10:58 AM 03/21/2022   12:12 PM  CMP  Glucose 70 - 99 mg/dL 133  119  166   BUN 8 - 23 mg/dL 41  47  56   Creatinine 0.44 - 1.00 mg/dL 1.66  1.70  1.97   Sodium 135 - 145 mmol/L 141  138  134   Potassium 3.5 - 5.1 mmol/L 4.6  4.1  3.9   Chloride 98 - 111 mmol/L 110  107  99   CO2 22 - 32 mmol/L '24  24  28   '$ Calcium 8.9 - 10.3 mg/dL 8.9  8.6  9.5   Total Protein 6.5 - 8.1 g/dL   7.8   Total Bilirubin 0.3 - 1.2 mg/dL   0.3   Alkaline Phos 38 - 126 U/L   111   AST 15 - 41 U/L   12   ALT 0 - 44 U/L   12       Latest Ref Rng & Units 03/23/2022    5:00 AM 03/21/2022   12:12 PM 03/20/2022    2:31 PM  CBC  WBC 4.0 - 10.5 K/uL 11.4  10.4  10.8   Hemoglobin 12.0 - 15.0 g/dL 8.4  9.2  9.7   Hematocrit 36.0 - 46.0 % 25.7  27.1  29.1   Platelets 150 - 400 K/uL 300  338  393       GENERAL: well appearing middle-aged African-American female in NAD  SKIN: skin color, texture, turgor are normal, no rashes or significant lesions EYES: conjunctiva are pink and non-injected, sclera clear LUNGS: clear to auscultation  and percussion with normal breathing effort HEART: regular rate & rhythm and no murmurs and no lower extremity edema Musculoskeletal: no cyanosis of digits and no clubbing  PSYCH: alert & oriented x 3, fluent speech NEURO: no focal motor/sensory deficits  Assessment/Plan:  # 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  We will make sure she is well supplied with as needed oxycodone in the outpatient setting --We will plan to start pembrolizumab 200 mg IV every 3 weeks until progression or intolerance --Oncology service will continue to follow  # Iron Deficiency Anemia -- Likely multifactorial due to worsening kidney function and hematuria --We will assess iron levels with iron panel and ferritin today --Avoid p.o. iron therapy in the setting of constipation   Ledell Peoples, MD Department of Hematology/Oncology [facility] Phone: [phone] Pager: [phone] Email: [email or web address]

## 2022-03-23 NOTE — Progress Notes (Signed)
Urology Progress Note   48F with large pelvic mass and right ureteral obstruction with hematuria  Subjective: NAEON. Successful cysto with right ureteral stent placement. Urine this AM red but thin without clot, voiding spontaneously. Creatinine downtrending  Objective: Vital signs in last 24 hours: Temp:  [97.8 F (36.6 C)-98.7 F (37.1 C)] 97.8 F (36.6 C) (07/19 0609) Pulse Rate:  [70-91] 70 (07/19 0609) Resp:  [15-23] 16 (07/19 0609) BP: (110-150)/(65-107) 150/84 (07/19 0609) SpO2:  [92 %-98 %] 92 % (07/19 0609) Weight:  [73.5 kg] 73.5 kg (07/18 1131)  Intake/Output from previous day: 07/18 0701 - 07/19 0700 In: 3191.1 [P.O.:420; I.V.:2721.1; IV Piggyback:50] Out: 1100 [Urine:1100] Intake/Output this shift: No intake/output data recorded.  Physical Exam:  General: Alert and oriented CV: Regular rate Lungs: No increased work of breathing Abdomen:  Soft, appropriately tender. GU: Voiding spontaneously  Ext: NT, No erythema  Lab Results: Recent Labs    03/20/22 1431 03/21/22 1212 03/23/22 0500  HGB 9.7* 9.2* 8.4*  HCT 29.1* 27.1* 25.7*    Recent Labs    03/22/22 1058 03/23/22 0500  NA 138 141  K 4.1 4.6  CL 107 110  CO2 24 24  GLUCOSE 119* 133*  BUN 47* 41*  CREATININE 1.70* 1.66*  CALCIUM 8.6* 8.9     Studies/Results: DG C-Arm 1-60 Min-No Report  Result Date: 03/22/2022 Fluoroscopy was utilized by the requesting physician.  No radiographic interpretation.    Assessment/Plan:  66 y.o. female with poorly differentiated carcinoma with ureteral obstruction (right) and hematuria now s/p right ureteral stent placement  Expect some degree of hematuria, no further urologic intervention necessary as this is 2/2 mass. Continue spontaneous voiding, if unable to urinate, would recommend obtaining a bladder scan and replacing catheter if elevated. Will coordinate outpatient follow-up with urology for stent exchange in 3-4 months   Dispo: floor

## 2022-03-23 NOTE — Plan of Care (Signed)
Pt c/o pain 7-8/10.  PRNs administered per MAR.  Problem: Education: Goal: Knowledge of General Education information will improve Description: Including pain rating scale, medication(s)/side effects and non-pharmacologic comfort measures 03/23/2022 1351 by Hewitt Blade, Harlem Thresher D, RN Outcome: Progressing 03/23/2022 1351 by Hewitt Blade, Tonika Eden D, RN Outcome: Progressing   Problem: Health Behavior/Discharge Planning: Goal: Ability to manage health-related needs will improve 03/23/2022 1351 by Hewitt Blade, Jerardo Costabile D, RN Outcome: Progressing 03/23/2022 1351 by Hewitt Blade, Ossiel Marchio D, RN Outcome: Progressing   Problem: Clinical Measurements: Goal: Ability to maintain clinical measurements within normal limits will improve 03/23/2022 1351 by Hewitt Blade, Gilman Olazabal D, RN Outcome: Progressing 03/23/2022 1351 by Hewitt Blade, Mariangela Heldt D, RN Outcome: Progressing Goal: Will remain free from infection 03/23/2022 1351 by Hewitt Blade, Taegen Delker D, RN Outcome: Progressing 03/23/2022 1351 by Hewitt Blade, Ferry Matthis D, RN Outcome: Progressing Goal: Diagnostic test results will improve 03/23/2022 1351 by Hewitt Blade, Samuel Mcpeek D, RN Outcome: Progressing 03/23/2022 1351 by Hewitt Blade, Roney Youtz D, RN Outcome: Progressing Goal: Respiratory complications will improve 03/23/2022 1351 by Hewitt Blade, Braxxton Stoudt D, RN Outcome: Progressing 03/23/2022 1351 by Hewitt Blade, Micheal Murad D, RN Outcome: Progressing Goal: Cardiovascular complication will be avoided 03/23/2022 1351 by Hewitt Blade, Jvion Turgeon D, RN Outcome: Progressing 03/23/2022 1351 by Hewitt Blade, Cardell Rachel D, RN Outcome: Progressing   Problem: Activity: Goal: Risk for activity intolerance will decrease 03/23/2022 1351 by Hewitt Blade, Joelie Schou D, RN Outcome: Progressing 03/23/2022 1351 by Hewitt Blade, Hendryx Ricke D, RN Outcome: Progressing   Problem: Nutrition: Goal: Adequate nutrition will be  maintained 03/23/2022 1351 by Hewitt Blade, Stanislaw Acton D, RN Outcome: Progressing 03/23/2022 1351 by Hewitt Blade, Truly Stankiewicz D, RN Outcome: Progressing   Problem: Coping: Goal: Level of anxiety will decrease 03/23/2022 1351 by Hewitt Blade, Dianne Whelchel D, RN Outcome: Progressing 03/23/2022 1351 by Hewitt Blade, Jakeia Carreras D, RN Outcome: Progressing   Problem: Elimination: Goal: Will not experience complications related to bowel motility 03/23/2022 1351 by Hewitt Blade, Salina Stanfield D, RN Outcome: Progressing 03/23/2022 1351 by Hewitt Blade, Treyveon Mochizuki D, RN Outcome: Progressing Goal: Will not experience complications related to urinary retention 03/23/2022 1351 by Hewitt Blade, Daielle Melcher D, RN Outcome: Progressing 03/23/2022 1351 by Hewitt Blade, Venera Privott D, RN Outcome: Progressing   Problem: Safety: Goal: Ability to remain free from injury will improve 03/23/2022 1351 by Hewitt Blade, Caetano Oberhaus D, RN Outcome: Progressing 03/23/2022 1351 by Hewitt Blade, Jarmar Rousseau D, RN Outcome: Progressing   Problem: Skin Integrity: Goal: Risk for impaired skin integrity will decrease 03/23/2022 1351 by Hewitt Blade, Glyn Gerads D, RN Outcome: Progressing 03/23/2022 1351 by Hewitt Blade, Reyce Lubeck D, RN Outcome: Progressing

## 2022-03-23 NOTE — Progress Notes (Signed)
PROGRESS NOTE    Mariah Schultz  XBD:[id number] DOB: June 08, 1956 DOA: 03/21/2022 PCP: Seward Carol, MD   Brief Narrative: Mariah Schultz is a 66 y.o. 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  Immunophenotype type not consistent with colorectal carcinoma.  06/2021 underwent laparoscopic diagnostic procedure with diverting loop colostomy and and anorectal exam under anesthesia.  Pathology results were consistent with a squamous cell carcinoma of unclear etiology. She underwent radiation and chemotherapy which were completed December 2022.  Interval MRI 02/01/2022 showed mildly enlarged mass measuring 6 x 4 cm.  She was going to have a repeated MRI in August 2023, she presented to the ED on 7/16 complaining of back pain, abdominal pain and rectal pressure that  started several weeks ago, getting worse.  She also presented with hematuria that  started 2 days prior to admission.  CT renal protocol 7/16: Essentially showed moderate to severe right hydronephrosis due to pelvic mass and possible high-grade obstruction of the distal right ureter due to extrinsic compression or infiltration by the malignant neoplasm in the pelvic cavity.  Patient admitted with hematuria, uncontrolled back pain, abdominal pain in setting enlarging pelvis mass, causing right obstructive hydronephrosis.  Urology consulted and s/p cystoscopy and right ureteral stent placement on 7/18.  Assessment & Plan:   Principal Problem:   AKI (acute kidney injury) ([place]) Active Problems:   Mass of pelvis   History of radiation therapy   Hematuria   Normocytic anemia   Hydronephrosis,  right  Large pelvic mass with right ureteral obstruction with hydronephrosis and hematuria Urology consulted, s/p cystoscopy and right ureteral stent placement 7/18. Still has hematuria but no clots.  Mild pelvic discomfort, may be a combination of pelvic mass more than the ureteral stent. Aspirin on hold.  Foley discontinued. Urine culture pending.  Only received perioperative IV antibiotics. Oncology consulted and awaiting input. As per urology, hematuria was from right ureter and if hematuria persists or worsens, she may need CT angiogram of the iliacs to make sure she does not have a vascular fistula into the ureter although rare but can occur with radiated pelvic masses. Follow CBCs and clinical course.  Acute kidney injury complicating CKD stage IIIb ; in setting obstructive uropathy In setting of pelvis mass obstructing right ureter.  IV fluids. Cr two months ago 1.2, one month ago 1.9. presents with cr 1.9 S/p right ureteral stent for obstruction. Creatinine improving, down to 1.6.  Continue IV fluids at reduced rate.  Continue to trend daily creatinine.  Poorly differentiated high-grade carcinoma squamous cell of unclear etiology;  Pelvis Mass increase in size on CT scan.  As per communication with Dr. Lorenso Courier, plan is to start second line systemic treatment started once she is stabilized. Multimodality pain control.   Anemia; Anemia panel; Normal iron, B12.  Anemia of chronic diseases, and malignancy.  Monitor hb in setting of hematuria. Mild hemoglobin drop from 9.2-8.4. Follow CBC daily and transfuse if hemoglobin 7 g or less.     HTN; Hold Diovan due to renal function. PRN hydralazine.  Labile.  S/p colostomy    Estimated body mass index is 27.81 kg/m as calculated from the following:   Height as of this encounter: '5\' 4"'$  (1.626 m).  Weight as of this encounter: 73.5 kg.   DVT prophylaxis: SCD Code Status: Full code Family Communication: Care discussed in detail  with patient's husband and daughter at bedside. Disposition Plan: Hopeful DC in the next 24 to 48 hours pending improvement in hematuria, creatinine, pain control, stability of hemoglobin and clearance by consultants. Status is: Inpatient Remains inpatient appropriate because: management of hematuria, back pain     Consultants:  Urology [facility] oncology Dr Lorenso Courier  Procedures:  As noted above.  Antimicrobials:    Subjective: Patient reports that she is doing much better with significant improvement in lower abdominal pain.  Still has some pelvic discomfort.  No longer passing clots.  Still has hematuria.  Was up to bathroom.  Tolerated breakfast/sandwich.  Small flatus and colostomy but no BM since prior to admission.  Objective: Vitals:   03/22/22 1745 03/22/22 1800 03/22/22 2028 03/23/22 0609  BP: (!) 131/107 (!) 142/78 110/65 (!) 150/84  Pulse: 77 75 91 70  Resp: (!) '23 15 16 16  '$ Temp:   98.7 F (37.1 C) 97.8 F (36.6 C)  TempSrc:   Oral Oral  SpO2: 93% 96% 96% 92%  Weight:      Height:        Intake/Output Summary (Last 24 hours) at 03/23/2022 1420 Last data filed at 03/23/2022 0947 Gross per 24 hour  Intake 3191.13 ml  Output 1280 ml  Net 1911.13 ml   Filed Weights   03/22/22 1131  Weight: 73.5 kg    Examination:  General exam: Middle-age female, moderately built and nourished lying comfortably propped up in bed without distress. Respiratory system: Clear to auscultation.  No increased work of breathing. Cardiovascular system: S1 and S2 heard, RRR.  No JVD, murmurs or pedal edema.   Gastrointestinal system: Abdomen is nondistended, soft and nontender. No organomegaly or masses felt. Normal bowel sounds heard.  Colostomy intact without acute findings and no stool noted. Central nervous system: Alert and oriented.  No focal deficits. Extremities: Symmetric 5 x 5 power.    Data Reviewed: I have personally reviewed following labs and imaging  studies  CBC: Recent Labs  Lab 03/20/22 1431 03/21/22 1212 03/23/22 0500  WBC 10.8* 10.4 11.4*  NEUTROABS 8.9* 8.7*  --   HGB 9.7* 9.2* 8.4*  HCT 29.1* 27.1* 25.7*  MCV 92.7 90.3 95.2  PLT 393 338 546   Basic Metabolic Panel: Recent Labs  Lab 03/20/22 1431 03/21/22 1212 03/22/22 1058 03/23/22 0500  NA 137 134* 138 141  K 3.9 3.9 4.1 4.6  CL 102 99 107 110  CO2 '25 28 24 24  '$ GLUCOSE 101* 166* 119* 133*  BUN 60* 56* 47* 41*  CREATININE 1.92* 1.97* 1.70* 1.66*  CALCIUM 9.4 9.5 8.6* 8.9   GFR: Estimated Creatinine Clearance: 32.3 mL/min (A) (by C-G formula based on SCr of 1.66 mg/dL (H)). Liver Function Tests: Recent Labs  Lab 03/20/22 1431 03/21/22 1212  AST 14* 12*  ALT 16 12  ALKPHOS 107 111  BILITOT 0.3 0.3  PROT 8.1 7.8  ALBUMIN 3.1* 3.6   Anemia Panel: Recent Labs    03/21/22 1647  VITAMINB12 866  FOLATE >40.0  FERRITIN 268  TIBC 272  IRON 29  RETICCTPCT 1.5    Radiology Studies: DG C-Arm 1-60 Min-No Report  Result Date: 03/22/2022 Fluoroscopy was utilized by the requesting physician.  No radiographic interpretation.        Scheduled Meds:  Chlorhexidine Gluconate Cloth  6 each Topical Daily   feeding  supplement  237 mL Oral BID BM   gabapentin  100 mg Oral BID   multivitamin with minerals  1 tablet Oral Daily   polyethylene glycol  17 g Oral Daily   Continuous Infusions:  sodium chloride 100 mL/hr at 03/23/22 1356     LOS: 1 day    Vernell Leep, MD,  FACP, [facility], [facility], [facility] (Care Management Physician Certified) [place]  To contact the attending provider between 7A-7P or the covering provider during after hours 7P-7A, please log into the web site [email or web address] and access using universal [place] password for that web site. If you do not have the password, please call the hospital operator.   03/23/2022, 2:20 PM

## 2022-03-24 DIAGNOSIS — N179 Acute kidney failure, unspecified: Secondary | ICD-10-CM | POA: Diagnosis not present

## 2022-03-24 LAB — CBC
HCT: 23.1 % — ABNORMAL LOW (ref 36.0–46.0)
Hemoglobin: 7.4 g/dL — ABNORMAL LOW (ref 12.0–15.0)
MCH: 30.7 pg (ref 26.0–34.0)
MCHC: 32 g/dL (ref 30.0–36.0)
MCV: 95.9 fL (ref 80.0–100.0)
Platelets: 277 10*3/uL (ref 150–400)
RBC: 2.41 MIL/uL — ABNORMAL LOW (ref 3.87–5.11)
RDW: 14.2 % (ref 11.5–15.5)
WBC: 10.4 10*3/uL (ref 4.0–10.5)
nRBC: 0 % (ref 0.0–0.2)

## 2022-03-24 LAB — BASIC METABOLIC PANEL
Anion gap: 5 (ref 5–15)
BUN: 31 mg/dL — ABNORMAL HIGH (ref 8–23)
CO2: 23 mmol/L (ref 22–32)
Calcium: 8.4 mg/dL — ABNORMAL LOW (ref 8.9–10.3)
Chloride: 113 mmol/L — ABNORMAL HIGH (ref 98–111)
Creatinine, Ser: 1.1 mg/dL — ABNORMAL HIGH (ref 0.44–1.00)
GFR, Estimated: 55 mL/min — ABNORMAL LOW (ref >60)
Glucose, Bld: 102 mg/dL — ABNORMAL HIGH (ref 70–99)
Potassium: 3.9 mmol/L (ref 3.5–5.1)
Sodium: 141 mmol/L (ref 135–145)

## 2022-03-24 LAB — HEMOGLOBIN AND HEMATOCRIT, BLOOD
HCT: 22.8 % — ABNORMAL LOW (ref 36.0–46.0)
Hemoglobin: 7.5 g/dL — ABNORMAL LOW (ref 12.0–15.0)

## 2022-03-24 LAB — FERRITIN: Ferritin: 250 ng/mL (ref 11–307)

## 2022-03-24 LAB — IRON AND TIBC
Iron: 22 ug/dL — ABNORMAL LOW (ref 28–170)
Saturation Ratios: 11 % (ref 10.4–31.8)
TIBC: 206 ug/dL — ABNORMAL LOW (ref 250–450)
UIBC: 184 ug/dL

## 2022-03-24 MED ORDER — POLYETHYLENE GLYCOL 3350 17 G PO PACK
17.0000 g | PACK | Freq: Two times a day (BID) | ORAL | Status: DC
  Administered 2022-03-24 – 2022-03-25 (×3): 17 g via ORAL
  Filled 2022-03-24 (×2): qty 1

## 2022-03-24 MED ORDER — OXYCODONE HCL 5 MG PO TABS
5.0000 mg | ORAL_TABLET | ORAL | Status: DC | PRN
  Administered 2022-03-24 – 2022-03-25 (×5): 10 mg via ORAL
  Filled 2022-03-24 (×5): qty 2

## 2022-03-24 NOTE — Progress Notes (Signed)
Urology Progress Note   44F with large pelvic mass and right ureteral obstruction with hematuria  Subjective: NAEON. Urine not saved this AM but per patient is more thin but still red, described as cranberry color. Hgb 7.4 from 8.4 yesterday. Feeling well overall and eager to go home  Objective: Vital signs in last 24 hours: Temp:  [97.9 F (36.6 C)-98.4 F (36.9 C)] 98.4 F (36.9 C) (07/20 0436) Pulse Rate:  [76-84] 76 (07/20 0436) Resp:  [16-18] 18 (07/20 0436) BP: (117-144)/(76-81) 129/80 (07/20 0436) SpO2:  [94 %-96 %] 96 % (07/20 0436)  Intake/Output from previous day: 07/19 0701 - 07/20 0700 In: 1401.3 [P.O.:300; I.V.:1101.3] Out: 450 [Urine:450] Intake/Output this shift: No intake/output data recorded.  Physical Exam:  General: Alert and oriented CV: Regular rate Lungs: No increased work of breathing Abdomen:  Soft, appropriately tender. GU: Voiding spontaneously  Ext: NT, No erythema  Lab Results: Recent Labs    03/21/22 1212 03/23/22 0500 03/24/22 0500  HGB 9.2* 8.4* 7.4*  HCT 27.1* 25.7* 23.1*    Recent Labs    03/23/22 0500 03/24/22 0500  NA 141 141  K 4.6 3.9  CL 110 113*  CO2 24 23  GLUCOSE 133* 102*  BUN 41* 31*  CREATININE 1.66* 1.10*  CALCIUM 8.9 8.4*     Studies/Results: DG C-Arm 1-60 Min-No Report  Result Date: 03/22/2022 Fluoroscopy was utilized by the requesting physician.  No radiographic interpretation.    Assessment/Plan:  66 y.o. female with poorly differentiated carcinoma with ureteral obstruction (right) and hematuria now s/p right ureteral stent placement  - Please rack and record urine so that we are able to see how her urine character changes over the day/night - Transfusion per primary team - Continue spontaneous voiding, if unable to urinate, would recommend obtaining a bladder scan and replacing catheter if elevated. - If she continues to bleed, could consider CT angio to assess for possible fistula/alternate  source - We will coordinate outpatient follow-up with urology for stent exchange in 3-4 months   Dispo: floor

## 2022-03-24 NOTE — Progress Notes (Signed)
PROGRESS NOTE    Mariah Schultz  KDT:[id number] DOB: 04/26/1956 DOA: 03/21/2022 PCP: Seward Carol, MD   Brief Narrative: Mariah Schultz is a 66 y.o. 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  Immunophenotype type not consistent with colorectal carcinoma.  06/2021 underwent laparoscopic diagnostic procedure with diverting loop colostomy and and anorectal exam under anesthesia.  Pathology results were consistent with a squamous cell carcinoma of unclear etiology. She underwent radiation and chemotherapy which were completed December 2022.  Interval MRI 02/01/2022 showed mildly enlarged mass measuring 6 x 4 cm.  She was going to have a repeated MRI in August 2023, she presented to the ED on 7/16 complaining of back pain, abdominal pain and rectal pressure that  started several weeks ago, getting worse.  She also presented with hematuria that  started 2 days prior to admission.  CT renal protocol 7/16: Essentially showed moderate to severe right hydronephrosis due to pelvic mass and possible high-grade obstruction of the distal right ureter due to extrinsic compression or infiltration by the malignant neoplasm in the pelvic cavity.  Patient admitted with hematuria, uncontrolled back pain, abdominal pain in setting enlarging pelvis mass, causing right obstructive hydronephrosis.  Urology consulted and s/p cystoscopy and right ureteral stent placement on 7/18.  Hgb dropped on 7/20 by 1 gram  Assessment & Plan:   Principal Problem:   AKI (acute kidney injury) ([place]) Active Problems:   Mass of pelvis   History of radiation therapy   Hematuria   Normocytic  anemia   Hydronephrosis, right  Large pelvic mass with right ureteral obstruction with hydronephrosis and hematuria Urology consulted, s/p cystoscopy and right ureteral stent placement 7/18. Still has hematuria but no clots.  Mild pelvic discomfort, may be a combination of pelvic mass more than the ureteral stent. -pain control-- will try to avoid IV meds today Aspirin on hold.  Foley discontinued. Oncology consulted: see below As per urology, hematuria was from right ureter and if hematuria persists or worsens, she may need CT angiogram of the iliacs to make sure she does not have a vascular fistula into the ureter although rare but can occur with radiated pelvic masses. -nursing to collect urine today so urology and see degree of blood.  Acute kidney injury complicating CKD stage IIIb ; in setting obstructive uropathy In setting of pelvis mass obstructing right ureter.  IV fluids. Cr two months ago 1.2, one month ago 1.9. presents with cr 1.9 S/p right ureteral stent for obstruction. CR improving  Poorly differentiated high-grade carcinoma squamous cell of unclear etiology;  Pelvis Mass increase in size on CT scan.   Dr. Lorenso Courier: plan to start pembrolizumab 200 mg IV every 3 weeks until progression or intolerance Multimodality pain control.   Anemia; Anemia panel; Normal iron, B12.  Anemia of chronic diseases, and malignancy.  Monitor hb in setting of hematuria. hemoglobin drop to 7.4 -repeat h/h and get type and screen Follow CBC daily and transfuse if hemoglobin 7 g or less.     HTN; Hold Diovan due to renal function. PRN hydralazine.  Labile.  S/p colostomy -bowel regimen for constipation   DVT prophylaxis: SCD Code Status: Full code Family Communication: Care discussed in detail with patient's husband and daughter at  bedside. Disposition Plan: Hopeful DC in the next 24 to 48 hours pending improvement in hematuria, creatinine, pain control, stability of hemoglobin and  clearance by consultants. Status is: Inpatient Remains inpatient appropriate because: management of hematuria, back pain     Consultants:  Urology oncology Dr Lorenso Courier   Subjective: No SOB, no CP Dark bloody urine per patient  Objective: Vitals:   03/23/22 0609 03/23/22 1427 03/23/22 2032 03/24/22 0436  BP: (!) 150/84 117/76 (!) 144/81 129/80  Pulse: 70 77 84 76  Resp: '16 16 18 18  '$ Temp: 97.8 F (36.6 C) 97.9 F (36.6 C) 98 F (36.7 C) 98.4 F (36.9 C)  TempSrc: Oral Oral Oral Oral  SpO2: 92% 96% 94% 96%  Weight:      Height:        Intake/Output Summary (Last 24 hours) at 03/24/2022 0932 Last data filed at 03/24/2022 0141 Gross per 24 hour  Intake 1401.25 ml  Output 450 ml  Net 951.25 ml   Filed Weights   03/22/22 1131  Weight: 73.5 kg    Examination:   General: Appearance:     Overweight female in no acute distress     Lungs:     respirations unlabored  Heart:    Normal heart rate.   MS:   All extremities are intact.   Neurologic:   Awake, alert       Data Reviewed: I have personally reviewed following labs and imaging studies  CBC: Recent Labs  Lab 03/20/22 1431 03/21/22 1212 03/23/22 0500 03/24/22 0500  WBC 10.8* 10.4 11.4* 10.4  NEUTROABS 8.9* 8.7*  --   --   HGB 9.7* 9.2* 8.4* 7.4*  HCT 29.1* 27.1* 25.7* 23.1*  MCV 92.7 90.3 95.2 95.9  PLT 393 338 300 174   Basic Metabolic Panel: Recent Labs  Lab 03/20/22 1431 03/21/22 1212 03/22/22 1058 03/23/22 0500 03/24/22 0500  NA 137 134* 138 141 141  K 3.9 3.9 4.1 4.6 3.9  CL 102 99 107 110 113*  CO2 '25 28 24 24 23  '$ GLUCOSE 101* 166* 119* 133* 102*  BUN 60* 56* 47* 41* 31*  CREATININE 1.92* 1.97* 1.70* 1.66* 1.10*  CALCIUM 9.4 9.5 8.6* 8.9 8.4*   GFR: Estimated Creatinine Clearance: 48.7 mL/min (A) (by C-G formula based on SCr of 1.1 mg/dL (H)). Liver Function Tests: Recent Labs  Lab 03/20/22 1431 03/21/22 1212  AST 14* 12*  ALT 16 12  ALKPHOS 107 111  BILITOT 0.3 0.3  PROT  8.1 7.8  ALBUMIN 3.1* 3.6   Anemia Panel: Recent Labs    03/21/22 1647 03/24/22 0603  VITAMINB12 866  --   FOLATE >40.0  --   FERRITIN 268 250  TIBC 272 206*  IRON 29 22*  RETICCTPCT 1.5  --     Radiology Studies: DG C-Arm 1-60 Min-No Report  Result Date: 03/22/2022 Fluoroscopy was utilized by the requesting physician.  No radiographic interpretation.        Scheduled Meds:  Chlorhexidine Gluconate Cloth  6 each Topical Daily   feeding supplement  237 mL Oral BID BM   gabapentin  100 mg Oral BID   multivitamin with minerals  1 tablet Oral Daily   polyethylene glycol  17 g Oral BID   Continuous Infusions:     LOS: 2 days    Geradine Girt, DO,   To contact the attending provider between 7A-7P or the covering provider during after hours 7P-7A, please log into the web site [email or web address]  and access using universal [place] password for that web site. If you do not have the password, please call the hospital operator.   03/24/2022, 9:32 AM

## 2022-03-24 NOTE — Progress Notes (Signed)
  Transition of Care [facility]) Screening Note   Patient Details  Name: Bobby Ragan Date of Birth: [birth year]   Transition of Care [facility]) CM/SW Contact:    Dessa Phi, RN Phone Number: 03/24/2022, 10:16 AM    Transition of Care Department [facility]) has reviewed patient and no TOC needs have been identified at this time. We will continue to monitor patient advancement through interdisciplinary progression rounds. If new patient transition needs arise, please place a TOC consult.

## 2022-03-25 ENCOUNTER — Other Ambulatory Visit: Payer: Self-pay | Admitting: *Deleted

## 2022-03-25 DIAGNOSIS — R319 Hematuria, unspecified: Secondary | ICD-10-CM

## 2022-03-25 DIAGNOSIS — N179 Acute kidney failure, unspecified: Secondary | ICD-10-CM | POA: Diagnosis not present

## 2022-03-25 DIAGNOSIS — K6289 Other specified diseases of anus and rectum: Secondary | ICD-10-CM

## 2022-03-25 DIAGNOSIS — C763 Malignant neoplasm of pelvis: Secondary | ICD-10-CM

## 2022-03-25 LAB — BASIC METABOLIC PANEL
Anion gap: 6 (ref 5–15)
BUN: 22 mg/dL (ref 8–23)
CO2: 25 mmol/L (ref 22–32)
Calcium: 8.6 mg/dL — ABNORMAL LOW (ref 8.9–10.3)
Chloride: 109 mmol/L (ref 98–111)
Creatinine, Ser: 1.33 mg/dL — ABNORMAL HIGH (ref 0.44–1.00)
GFR, Estimated: 44 mL/min — ABNORMAL LOW (ref >60)
Glucose, Bld: 97 mg/dL (ref 70–99)
Potassium: 3.9 mmol/L (ref 3.5–5.1)
Sodium: 140 mmol/L (ref 135–145)

## 2022-03-25 LAB — CBC
HCT: 21.7 % — ABNORMAL LOW (ref 36.0–46.0)
Hemoglobin: 7 g/dL — ABNORMAL LOW (ref 12.0–15.0)
MCH: 30.6 pg (ref 26.0–34.0)
MCHC: 32.3 g/dL (ref 30.0–36.0)
MCV: 94.8 fL (ref 80.0–100.0)
Platelets: 361 10*3/uL (ref 150–400)
RBC: 2.29 MIL/uL — ABNORMAL LOW (ref 3.87–5.11)
RDW: 14.5 % (ref 11.5–15.5)
WBC: 9.8 10*3/uL (ref 4.0–10.5)
nRBC: 0 % (ref 0.0–0.2)

## 2022-03-25 LAB — PREPARE RBC (CROSSMATCH)

## 2022-03-25 MED ORDER — HEPARIN SOD (PORK) LOCK FLUSH 100 UNIT/ML IV SOLN
500.0000 [IU] | Freq: Once | INTRAVENOUS | Status: AC
  Administered 2022-03-25: 500 [IU] via INTRAVENOUS
  Filled 2022-03-25: qty 5

## 2022-03-25 MED ORDER — SODIUM CHLORIDE 0.9% IV SOLUTION: Freq: Once | INTRAVENOUS | Status: AC

## 2022-03-25 MED ORDER — OXYCODONE HCL 10 MG PO TABS
10.0000 mg | ORAL_TABLET | ORAL | 0 refills | Status: DC | PRN
Start: 2022-03-25 — End: 2022-04-19

## 2022-03-25 NOTE — Discharge Summary (Signed)
Physician Discharge Summary  Tzirel Leonor LKG:[id number] DOB: 1956/04/26 DOA: 03/21/2022  PCP: Seward Carol, MD  Admit date: 03/21/2022 Discharge date: 03/25/2022  Admitted From: home Disposition:  home  Recommendations for Outpatient Follow-up:  Follow up with PCP in 1-2 weeks Follow up with Dr Lorenso Courier in 4 days as scheduled, check a [place]: none Equipment/Devices: none  Discharge Condition: stable CODE STATUS: Full code Diet Orders (From admission, onward)     Start     Ordered   03/22/22 1836  Diet regular Room service appropriate? Yes; Fluid consistency: Thin  Diet effective now       Question Answer Comment  Room service appropriate? Yes   Fluid consistency: Thin      03/22/22 1835            HPI: Per admitting MD, Mariah Schultz is a 66 y.o. 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  Immunophenotype type not consistent with colorectal carcinoma.  06/2021 underwent laparoscopic diagnostic procedure with diverting loop colostomy and and anorectal exam under anesthesia.  Pathology results were consistent with a squamous cell carcinoma of unclear etiology.  She underwent radiation and chemotherapy which were completed December 2022.  MRI 02/01/2022 showed mildly enlarged mass measuring 6 x 4 cm.  She was going to have a repeated MRI in August 2023, she presented to the ED on 7/16 complaining of back pain, abdominal pain and rectal pressure that  started several weeks ago, getting worse.  She also presented with hematuria that  started 2 days prior to admission.  She has been passing a small blood clot when she urinate.   She was evaluated  in the ED 7/16, she was waiting for bed, but she decided to go home because it was taking too long.  She went to see oncology PA today and was referred for direct admission for further evaluation. She report decreased appetite and poor oral intake.  She also report having decreases ostomy output. She think is because she has  not been eating.   Hospital Course / Discharge diagnoses: Principal Problem:   AKI (acute kidney injury) ([place]) Active Problems:   Mass of pelvis   History of radiation therapy   Hematuria   Normocytic anemia   Hydronephrosis, right  Principal problem Large pelvic mass with right ureteral obstruction, with hydronephrosis and hematuria-urology, oncology consulted and followed patient while hospitalized.  She underwent cystoscopy and right ureteral stent placement on 7/18.  Patient has been having intermittent abdominal discomfort relieved with oral oxycodone.  Following the procedure she had a Foley but now this has been discontinued and she is urinating on her own.  Her hematuria somewhat persisted following the procedure, raising suspicion potentially that it may be related due to her malignancy proximity to the right ureter and potentially bleed related to that.  Case was discussed with interventional radiology, Dr. Vernard Gambles, and a CT angiogram would not be helpful, and in addition an embolization would be very risky in tumor vascular bed and not feasible.  Discussed with Dr. Jeffie Pollock with urology, and while might be able to localize the bleed better, however ureteroscopic fulguration is difficult.  Best approach probably at this point would be to address of the underlying  issue which is her cancer.  She has been having a very slow decrease in her hemoglobin over the past week and she is not overtly bleeding.  Hemoglobin is 7.0, and she will be transfused a unit of packed red blood cells prior to discharge.  Case was discussed with Dr. Lorenso Courier with hematology, and he will follow-up  with patient early next week for repeat CBC and potential for further transfusions as well as initiation of treatment for her underlying tumor.  She is stable, asking to go home over the weekend, and will be discharged home in stable condition.  Active problems Acute kidney injury on chronic kidney disease stage IIIb-creatinine was elevated 1.9 on admission in the setting of obstructive uropathy with pelvic mass obstructing the right ureter.  After decompression her creatinine improved, has returned to baseline and is 1.3 on discharge Acute blood loss anemia-also component of anemia of chronic disease and underlying malignancy.  As above, hemoglobin is 7.0, she will be transfused a unit of packed red blood cells prior to discharge.  Primary oncologist will continue to follow CBC as an outpatient Essential hypertension-resume home medications S/p colostomy  Sepsis ruled out   Discharge Instructions   Allergies as of 03/25/2022       Reactions   Other Swelling   pneumonia vaccine   Pneumococcal Vac Polyvalent Swelling        Medication List     STOP taking these medications    aspirin 81 MG tablet       TAKE these medications    acetaminophen 500 MG tablet Commonly known as: TYLENOL Take 500 mg by mouth 3 (three) times daily as needed (pain).   FISH OIL PO Take 1 capsule by mouth daily.   gabapentin 100 MG capsule Commonly known as: NEURONTIN Take 100 mg by mouth 2 (two) times daily. What changed: Another medication with the same name was removed. Continue taking this medication, and follow the directions you see here.   lidocaine-prilocaine cream Commonly known as: EMLA Apply 1 application topically as needed. What changed: reasons to take this   multivitamin tablet Take 1 tablet by mouth daily.   ondansetron 8 MG tablet Commonly known as: ZOFRAN Take 1 tablet (8 mg total) by mouth every 8 (eight) hours as needed. What changed:  when to take this reasons to  take this   Oxycodone HCl 10 MG Tabs Take 1 tablet (10 mg total) by mouth every 4 (four) hours as needed for up to 7 days for moderate pain. What changed:  medication strength how much to take reasons to take this   polyethylene glycol 17 g packet Commonly known as: MIRALAX / GLYCOLAX Take 17 g by mouth daily.   prochlorperazine 10 MG tablet Commonly known as: COMPAZINE Take 1 tablet (10 mg total) by mouth every 6 (six) hours as needed for nausea or vomiting.   valsartan-hydrochlorothiazide 160-12.5 MG tablet Commonly known as: DIOVAN-HCT Take 1 tablet by mouth daily.   vitamin C 500 MG tablet Commonly known as: ASCORBIC ACID Take 500 mg by mouth daily.         Procedures/Studies:  DG C-Arm 1-60 Min-No Report  Result Date: 03/22/2022 Fluoroscopy was utilized by the requesting physician.  No radiographic interpretation.   CT Renal Stone Study  Result Date: 03/20/2022 CLINICAL DATA:  Flank pain, hematuria EXAM: CT ABDOMEN AND PELVIS WITHOUT CONTRAST TECHNIQUE: Multidetector CT imaging of the abdomen and pelvis was performed following the standard protocol without IV contrast. RADIATION  DOSE REDUCTION: This exam was performed according to the departmental dose-optimization program which includes automated exposure control, adjustment of the mA and/or kV according to patient size and/or use of iterative reconstruction technique. COMPARISON:  05/11/2021 FINDINGS: Lower chest: Small linear densities in the lower lung fields suggest scarring or subsegmental atelectasis. There is ectasia of bronchi. Hepatobiliary: Liver is enlarged measuring 19.6 cm in length. No focal abnormalities are seen. Gallbladder is unremarkable. Pancreas: There is atrophy and pancreas. No focal abnormalities are seen. Spleen: Unremarkable. Adrenals/Urinary Tract: Adrenals are unremarkable. There is no hydronephrosis on the left side. There is moderate to marked right hydronephrosis. There is dilation of right  ureter to the level of pelvic inlet. There are no opaque renal or ureteral stones. There is a soft tissue mass measuring 11.1 x 7 cm in size and pelvis extending more to the right. Left ureter is not dilated. Urinary bladder is not distended. Stomach/Bowel: Stomach is unremarkable. Small bowel loops are not dilated. The appendix is not seen. Colostomy is noted in left mid abdomen. Diverticula are seen in colon without signs of focal diverticulitis. Vascular/Lymphatic: Scattered arterial calcifications are seen. Reproductive: Uterus could not be distinctly identified. Other: There is no ascites or pneumoperitoneum. Small bilateral inguinal hernias containing fat are noted. Small umbilical hernia containing fat is seen. Musculoskeletal: There is minimal anterolisthesis at L4-L5 level. IMPRESSION: There is no evidence of intestinal obstruction or pneumoperitoneum. There is interval appearance of moderate to severe right hydronephrosis. There are no demonstrable opaque renal or ureteral stones. There is 11.1 x 7 cm soft tissue mass in pelvic cavity extending more to the right. Findings suggest possible high-grade obstruction of the distal right ureter caused by extrinsic compression or infiltration by the malignant neoplasm in pelvic cavity. Diverticulosis of colon without signs of focal diverticulitis. 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 Other findings as described in the body of the report. Electronically Signed   By: Elmer Picker M.D.   On: 03/20/2022 14:42     Subjective: - no chest pain, shortness of breath, no abdominal pain, nausea or vomiting.   Discharge Exam: BP (!) 145/82 (BP Location: Right Arm)   Pulse 71   Temp 99.1 F (37.3 C) (Oral)   Resp 18   Ht '5\' 4"'$  (1.626 m)   Wt 73.5 kg   SpO2 92%   BMI 27.81 kg/m   General: Pt is alert, awake, not in acute distress Cardiovascular: RRR, S1/S2 +, no rubs, no gallops Respiratory: CTA bilaterally, no wheezing, no rhonchi Abdominal:  Soft, NT, ND, bowel sounds + Extremities: no edema, no cyanosis    The results of significant diagnostics from this hospitalization (including imaging, microbiology, ancillary and laboratory) are listed below for reference.     Microbiology: Recent Results (from the past 240 hour(s))  Urine Culture     Status: Abnormal   Collection Time: 03/21/22  4:42 PM   Specimen: Urine, Clean Catch  Result Value Ref Range Status   Specimen Description   Final    URINE, CLEAN CATCH Performed at [facility], [place] [address] [place], [place] [address]    Special Requests   Final    NONE Performed at [facility], [place] [address] [place], [place] [address]    Culture (A)  Final    <10,000 COLONIES/mL INSIGNIFICANT GROWTH Performed at [facility] [address] [place], [place] [address]    Report Status 03/23/2022 FINAL  Final  Labs: Basic Metabolic Panel: Recent Labs  Lab 03/21/22 1212 03/22/22 1058 03/23/22 0500 03/24/22 0500 03/25/22 0500  NA 134* 138 141 141 140  K 3.9 4.1 4.6 3.9 3.9  CL 99 107 110 113* 109  CO2 '28 24 24 23 25  '$ GLUCOSE 166* 119* 133* 102* 97  BUN 56* 47* 41* 31* 22  CREATININE 1.97* 1.70* 1.66* 1.10* 1.33*  CALCIUM 9.5 8.6* 8.9 8.4* 8.6*   Liver Function Tests: Recent Labs  Lab 03/20/22 1431 03/21/22 1212  AST 14* 12*  ALT 16 12  ALKPHOS 107 111  BILITOT 0.3 0.3  PROT 8.1 7.8  ALBUMIN 3.1* 3.6   CBC: Recent Labs  Lab 03/20/22 1431 03/21/22 1212 03/23/22 0500 03/24/22 0500 03/24/22 1216 03/25/22 1028  WBC 10.8* 10.4 11.4* 10.4  --  9.8  NEUTROABS 8.9* 8.7*  --   --   --   --   HGB 9.7* 9.2* 8.4* 7.4* 7.5* 7.0*  HCT 29.1* 27.1* 25.7* 23.1* 22.8* 21.7*  MCV 92.7 90.3 95.2 95.9  --  94.8  PLT 393 338 300 277  --  361   CBG: No results for input(s): "GLUCAP" in the last 168 hours. Hgb A1c No results for input(s): "HGBA1C" in the last 72 hours. Lipid Profile No results for input(s):  "CHOL", "HDL", "LDLCALC", "TRIG", "CHOLHDL", "LDLDIRECT" in the last 72 hours. Thyroid function studies No results for input(s): "TSH", "T4TOTAL", "T3FREE", "THYROIDAB" in the last 72 hours.  Invalid input(s): "FREET3" Urinalysis    Component Value Date/Time   COLORURINE RED (A) 03/20/2022 1349   APPEARANCEUR TURBID (A) 03/20/2022 1349   LABSPEC  03/20/2022 1349    TEST NOT REPORTED DUE TO COLOR INTERFERENCE OF URINE PIGMENT   PHURINE  03/20/2022 1349    TEST NOT REPORTED DUE TO COLOR INTERFERENCE OF URINE PIGMENT   GLUCOSEU (A) 03/20/2022 1349    TEST NOT REPORTED DUE TO COLOR INTERFERENCE OF URINE PIGMENT   HGBUR (A) 03/20/2022 1349    TEST NOT REPORTED DUE TO COLOR INTERFERENCE OF URINE PIGMENT   BILIRUBINUR (A) 03/20/2022 1349    TEST NOT REPORTED DUE TO COLOR INTERFERENCE OF URINE PIGMENT   KETONESUR (A) 03/20/2022 1349    TEST NOT REPORTED DUE TO COLOR INTERFERENCE OF URINE PIGMENT   PROTEINUR (A) 03/20/2022 1349    TEST NOT REPORTED DUE TO COLOR INTERFERENCE OF URINE PIGMENT   UROBILINOGEN 1.0 02/25/2013 1602   NITRITE (A) 03/20/2022 1349    TEST NOT REPORTED DUE TO COLOR INTERFERENCE OF URINE PIGMENT   LEUKOCYTESUR (A) 03/20/2022 1349    TEST NOT REPORTED DUE TO COLOR INTERFERENCE OF URINE PIGMENT    FURTHER DISCHARGE INSTRUCTIONS:   Get Medicines reviewed and adjusted: Please take all your medications with you for your next visit with your Primary MD   Laboratory/radiological data: Please request your Primary MD to go over all hospital tests and procedure/radiological results at the follow up, please ask your Primary MD to get all Hospital records sent to his/her office.   In some cases, they will be blood work, cultures and biopsy results pending at the time of your discharge. Please request that your primary care M.D. goes through all the records of your hospital data and follows up on these results.   Also Note the following: If you experience worsening of your  admission symptoms, develop shortness of breath, life threatening emergency, suicidal or homicidal thoughts you must seek medical attention immediately by calling 911 or calling your MD immediately  if symptoms less severe.   You must read complete instructions/literature along with all the possible adverse reactions/side effects for all the Medicines you take and that have been prescribed to you. Take any new Medicines after you have completely understood and accpet all the possible adverse reactions/side effects.    Do not drive when taking Pain medications or sleeping medications (Benzodaizepines)   Do not take more than prescribed Pain, Sleep and Anxiety Medications. It is not advisable to combine anxiety,sleep and pain medications without talking with your primary care practitioner   Special Instructions: If you have smoked or chewed Tobacco  in the last 2 yrs please stop smoking, stop any regular Alcohol  and or any Recreational drug use.   Wear Seat belts while driving.   Please note: You were cared for by a hospitalist during your hospital stay. Once you are discharged, your primary care physician will handle any further medical issues. Please note that NO REFILLS for any discharge medications will be authorized once you are discharged, as it is imperative that you return to your primary care physician (or establish a relationship with a primary care physician if you do not have one) for your post hospital discharge needs so that they can reassess your need for medications and monitor your lab values.  Time coordinating discharge: 60 minutes  SIGNED:  Marzetta Board, MD, PhD 03/25/2022, 2:44 PM

## 2022-03-25 NOTE — Care Management Important Message (Signed)
Important Message  Patient Details IM Letter given to the Patient. Name: Mariah Schultz MRN: [id number] Date of Birth: [birth year]   Medicare Important Message Given:  Yes     Kerin Salen 03/25/2022, 9:38 AM

## 2022-03-25 NOTE — Progress Notes (Signed)
Urology Progress Note   52F with large pelvic mass and right ureteral obstruction with hematuria  Subjective: NAEON. Hgb stable on afternoon recheck yesterday, this AM pending. Urine continues to be light red but thin and improving overall, patient denies issues voiding.  Objective: Vital signs in last 24 hours: Temp:  [98.7 F (37.1 C)-99 F (37.2 C)] 99 F (37.2 C) (07/21 0446) Pulse Rate:  [76-84] 76 (07/21 0446) Resp:  [16-18] 18 (07/21 0446) BP: (105-120)/(58-61) 113/58 (07/21 0446) SpO2:  [93 %-97 %] 93 % (07/21 0446)  Intake/Output from previous day: 07/20 0701 - 07/21 0700 In: 480 [P.O.:480] Out: -  Intake/Output this shift: No intake/output data recorded.  Physical Exam:  General: Alert and oriented CV: Regular rate Lungs: No increased work of breathing Abdomen:  Soft, appropriately tender. GU: Voiding spontaneously  Ext: NT, No erythema  Lab Results: Recent Labs    03/23/22 0500 03/24/22 0500 03/24/22 1216  HGB 8.4* 7.4* 7.5*  HCT 25.7* 23.1* 22.8*    Recent Labs    03/24/22 0500 03/25/22 0500  NA 141 140  K 3.9 3.9  CL 113* 109  CO2 23 25  GLUCOSE 102* 97  BUN 31* 22  CREATININE 1.10* 1.33*  CALCIUM 8.4* 8.6*     Studies/Results: No results found.  Assessment/Plan:  66 y.o. 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 If stable, no further urologic needs - Transfusion per primary team - We will coordinate outpatient follow-up with urology for stent exchange in 3-4 months   Dispo: floor

## 2022-03-26 LAB — TYPE AND SCREEN
ABO/RH(D): A POS
Antibody Screen: NEGATIVE
Unit division: 0

## 2022-03-26 LAB — BPAM RBC
Blood Product Expiration Date: 202308072359
ISSUE DATE / TIME: 202307211446
Unit Type and Rh: 6200

## 2022-03-27 ENCOUNTER — Other Ambulatory Visit: Payer: Self-pay | Admitting: Hematology and Oncology

## 2022-03-27 NOTE — Progress Notes (Signed)
DISCONTINUE OFF PATHWAY REGIMEN - Other   [id number]:Cisplatin 40 mg/m2 IV D1 q7 Days + RT:   A cycle is every 7 days:     Cisplatin   **Always confirm dose/schedule in your pharmacy ordering system**  REASON: Disease Progression PRIOR TREATMENT: Cisplatin 40 mg/m2 IV D1 q7 Days + RT TREATMENT RESPONSE: Partial Response (PR)  START OFF PATHWAY REGIMEN - Other   [id number]:Pembrolizumab 200 mg IV D1 q21 Days:   A cycle is every 21 days:     Pembrolizumab   **Always confirm dose/schedule in your pharmacy ordering system**  Patient Characteristics: Intent of Therapy: Non-Curative / Palliative Intent, Discussed with Patient

## 2022-03-28 ENCOUNTER — Other Ambulatory Visit: Payer: Self-pay | Admitting: *Deleted

## 2022-03-28 ENCOUNTER — Telehealth: Payer: Self-pay | Admitting: Hematology and Oncology

## 2022-03-28 ENCOUNTER — Other Ambulatory Visit: Payer: Self-pay

## 2022-03-28 DIAGNOSIS — C763 Malignant neoplasm of pelvis: Secondary | ICD-10-CM

## 2022-03-28 NOTE — Telephone Encounter (Signed)
Scheduled per 7/22 sch msg, pt has been called and confirmed

## 2022-03-29 ENCOUNTER — Inpatient Hospital Stay: Payer: Medicare HMO

## 2022-03-29 ENCOUNTER — Telehealth: Payer: Self-pay

## 2022-03-29 ENCOUNTER — Other Ambulatory Visit: Payer: Self-pay

## 2022-03-29 ENCOUNTER — Inpatient Hospital Stay (HOSPITAL_BASED_OUTPATIENT_CLINIC_OR_DEPARTMENT_OTHER): Payer: Medicare HMO | Admitting: Physician Assistant

## 2022-03-29 VITALS — BP 130/70 | HR 82 | Temp 97.5°F | Resp 18 | Ht 64.0 in | Wt 164.7 lb

## 2022-03-29 DIAGNOSIS — F17210 Nicotine dependence, cigarettes, uncomplicated: Secondary | ICD-10-CM | POA: Diagnosis not present

## 2022-03-29 DIAGNOSIS — Z79899 Other long term (current) drug therapy: Secondary | ICD-10-CM | POA: Insufficient documentation

## 2022-03-29 DIAGNOSIS — R7989 Other specified abnormal findings of blood chemistry: Secondary | ICD-10-CM

## 2022-03-29 DIAGNOSIS — M549 Dorsalgia, unspecified: Secondary | ICD-10-CM | POA: Insufficient documentation

## 2022-03-29 DIAGNOSIS — K4020 Bilateral inguinal hernia, without obstruction or gangrene, not specified as recurrent: Secondary | ICD-10-CM | POA: Insufficient documentation

## 2022-03-29 DIAGNOSIS — C19 Malignant neoplasm of rectosigmoid junction: Secondary | ICD-10-CM | POA: Insufficient documentation

## 2022-03-29 DIAGNOSIS — C763 Malignant neoplasm of pelvis: Secondary | ICD-10-CM

## 2022-03-29 DIAGNOSIS — R319 Hematuria, unspecified: Secondary | ICD-10-CM | POA: Diagnosis not present

## 2022-03-29 DIAGNOSIS — Z923 Personal history of irradiation: Secondary | ICD-10-CM | POA: Insufficient documentation

## 2022-03-29 DIAGNOSIS — D649 Anemia, unspecified: Secondary | ICD-10-CM | POA: Diagnosis not present

## 2022-03-29 DIAGNOSIS — R1900 Intra-abdominal and pelvic swelling, mass and lump, unspecified site: Secondary | ICD-10-CM | POA: Diagnosis not present

## 2022-03-29 DIAGNOSIS — Z933 Colostomy status: Secondary | ICD-10-CM | POA: Insufficient documentation

## 2022-03-29 DIAGNOSIS — G893 Neoplasm related pain (acute) (chronic): Secondary | ICD-10-CM

## 2022-03-29 DIAGNOSIS — N131 Hydronephrosis with ureteral stricture, not elsewhere classified: Secondary | ICD-10-CM | POA: Diagnosis not present

## 2022-03-29 DIAGNOSIS — K429 Umbilical hernia without obstruction or gangrene: Secondary | ICD-10-CM | POA: Diagnosis not present

## 2022-03-29 DIAGNOSIS — K5730 Diverticulosis of large intestine without perforation or abscess without bleeding: Secondary | ICD-10-CM | POA: Diagnosis not present

## 2022-03-29 DIAGNOSIS — M4316 Spondylolisthesis, lumbar region: Secondary | ICD-10-CM | POA: Diagnosis not present

## 2022-03-29 DIAGNOSIS — Z5112 Encounter for antineoplastic immunotherapy: Secondary | ICD-10-CM | POA: Diagnosis not present

## 2022-03-29 DIAGNOSIS — Z95828 Presence of other vascular implants and grafts: Secondary | ICD-10-CM

## 2022-03-29 LAB — CBC WITH DIFFERENTIAL (CANCER CENTER ONLY)
Abs Immature Granulocytes: 0.13 10*3/uL — ABNORMAL HIGH (ref 0.00–0.07)
Basophils Absolute: 0 10*3/uL (ref 0.0–0.1)
Basophils Relative: 0 %
Eosinophils Absolute: 0.3 10*3/uL (ref 0.0–0.5)
Eosinophils Relative: 2 %
HCT: 30.9 % — ABNORMAL LOW (ref 36.0–46.0)
Hemoglobin: 10.3 g/dL — ABNORMAL LOW (ref 12.0–15.0)
Immature Granulocytes: 1 %
Lymphocytes Relative: 2 %
Lymphs Abs: 0.3 10*3/uL — ABNORMAL LOW (ref 0.7–4.0)
MCH: 30.1 pg (ref 26.0–34.0)
MCHC: 33.3 g/dL (ref 30.0–36.0)
MCV: 90.4 fL (ref 80.0–100.0)
Monocytes Absolute: 0.6 10*3/uL (ref 0.1–1.0)
Monocytes Relative: 4 %
Neutro Abs: 13.9 10*3/uL — ABNORMAL HIGH (ref 1.7–7.7)
Neutrophils Relative %: 91 %
Platelet Count: 360 10*3/uL (ref 150–400)
RBC: 3.42 MIL/uL — ABNORMAL LOW (ref 3.87–5.11)
RDW: 14.9 % (ref 11.5–15.5)
WBC Count: 15.3 10*3/uL — ABNORMAL HIGH (ref 4.0–10.5)
nRBC: 0 % (ref 0.0–0.2)

## 2022-03-29 LAB — CMP (CANCER CENTER ONLY)
ALT: 19 U/L (ref 0–44)
AST: 20 U/L (ref 15–41)
Albumin: 3.4 g/dL — ABNORMAL LOW (ref 3.5–5.0)
Alkaline Phosphatase: 151 U/L — ABNORMAL HIGH (ref 38–126)
Anion gap: 10 (ref 5–15)
BUN: 27 mg/dL — ABNORMAL HIGH (ref 8–23)
CO2: 28 mmol/L (ref 22–32)
Calcium: 9.4 mg/dL (ref 8.9–10.3)
Chloride: 98 mmol/L (ref 98–111)
Creatinine: 1.46 mg/dL — ABNORMAL HIGH (ref 0.44–1.00)
GFR, Estimated: 39 mL/min — ABNORMAL LOW (ref >60)
Glucose, Bld: 157 mg/dL — ABNORMAL HIGH (ref 70–99)
Potassium: 4 mmol/L (ref 3.5–5.1)
Sodium: 136 mmol/L (ref 135–145)
Total Bilirubin: 0.4 mg/dL (ref 0.3–1.2)
Total Protein: 7.4 g/dL (ref 6.5–8.1)

## 2022-03-29 LAB — URINALYSIS, COMPLETE (UACMP) WITH MICROSCOPIC
Bilirubin Urine: NEGATIVE
Glucose, UA: NEGATIVE mg/dL
Ketones, ur: 5 mg/dL — AB
Nitrite: NEGATIVE
Protein, ur: 100 mg/dL — AB
RBC / HPF: >50 RBC/hpf — ABNORMAL HIGH (ref 0–5)
Specific Gravity, Urine: 1.021 (ref 1.005–1.030)
WBC, UA: >50 WBC/hpf — ABNORMAL HIGH (ref 0–5)
pH: 5 (ref 5.0–8.0)

## 2022-03-29 LAB — SAMPLE TO BLOOD BANK

## 2022-03-29 MED ORDER — MORPHINE SULFATE (PF) 2 MG/ML IV SOLN
2.0000 mg | Freq: Once | INTRAVENOUS | Status: AC
  Administered 2022-03-29: 2 mg via INTRAVENOUS
  Filled 2022-03-29: qty 1

## 2022-03-29 MED ORDER — OXYCODONE HCL ER 10 MG PO T12A: 10.0000 mg | EXTENDED_RELEASE_TABLET | Freq: Two times a day (BID) | ORAL | 0 refills | Status: DC

## 2022-03-29 MED ORDER — XTAMPZA ER 9 MG PO C12A
9.0000 mg | EXTENDED_RELEASE_CAPSULE | Freq: Two times a day (BID) | ORAL | 0 refills | Status: DC | PRN
Start: 2022-03-29 — End: 2022-04-19

## 2022-03-29 MED ORDER — HEPARIN SOD (PORK) LOCK FLUSH 100 UNIT/ML IV SOLN
500.0000 [IU] | Freq: Once | INTRAVENOUS | Status: AC
  Administered 2022-03-29: 500 [IU]

## 2022-03-29 MED ORDER — SODIUM CHLORIDE 0.9% FLUSH
10.0000 mL | Freq: Once | INTRAVENOUS | Status: AC
  Administered 2022-03-29: 10 mL

## 2022-03-29 NOTE — Patient Instructions (Signed)
Morphine Injection What is this medication? MORPHINE (MOR feen) treats severe pain. It is prescribed when other pain medications have not worked or cannot be tolerated. It works by blocking pain signals in the brain. It belongs to a group of medications called opioids. This medicine may be used for other purposes; ask your health care provider or pharmacist if you have questions. COMMON BRAND NAME(S): Astramorph PF, Duramorph, Duramorph PF, Infumorph, MITIGO What should I tell my care team before I take this medication? They need to know if you have any of these conditions: Bleeding disorder Brain tumor Drug abuse or addiction Head injury Heart disease If you often drink alcohol Low adrenal gland function Lung disease, asthma, or breathing problem Seizures Stomach or intestine problems Take medications that treat or prevent blood clots Taken an MAOI such as Marplan, Nardil, or Parnate in the last 14 days Trouble passing urine An unusual or allergic reaction to morphine, other medications, foods, dyes, or preservatives Pregnant or trying to get pregnant Breast-feeding How should I use this medication? This medication is for injection into a muscle, vein, or under the skin. It is usually given in a hospital or clinic setting. If you get this medication at home, you will be taught how to prepare and give this medication. Use exactly as directed. Take your medication at regular intervals. Do not take your medication more often than directed. Always look at your medication before using it. Do not use the injection if its color is darker than pale yellow or if it is discolored in any other way. Do not use this medication if it is cloudy, thickened, colored, or has solid particles in it. It is important that you put your used needles and syringes in a special sharps container. Do not put them in a trash can. If you do not have a sharps container, call your pharmacist or care team to get  one. Talk to your care team regarding the use of this medication in children. Special care may be needed. Overdosage: If you think you have taken too much of this medicine contact a poison control center or emergency room at once. NOTE: This medicine is only for you. Do not share this medicine with others. What if I miss a dose? If you miss a dose, take it as soon as you can. If it is almost time for your next dose, take only that dose. Do not take double or extra doses. What may interact with this medication? Do not take this medication with any of the following: Linezolid MAOIs like Marplan, Nardil, and Parnate Methylene blue Samidorphan This medication may interact with the following: Alcohol Antihistamines for allergy, cough, and cold Atropine Certain medications for anxiety or sleep Certain medications for bladder problems like oxybutynin, tolterodine Certain medications for depression like amitriptyline, fluoxetine, sertraline, mirtazapine, trazodone Certain medications for migraine headache like almotriptan, eletriptan, frovatriptan, naratriptan, rizatriptan, sumatriptan, zolmitriptan Certain medications for nausea or vomiting like dolasetron, granisetron, ondansetron, palonosetron Certain medications for Parkinson's disease like benztropine, trihexyphenidyl Certain medications for seizures like phenobarbital, primidone Certain medications for stomach problems like dicyclomine, hyoscyamine Certain medications for travel sickness like scopolamine Clopidogrel Diuretics General anesthetics like halothane, isoflurane, methoxyflurane, propofol Ipratropium Medications that relax muscles Other narcotic medications for pain or cough Phenothiazines like chlorpromazine, mesoridazine, prochlorperazine, thioridazine Prasugrel Ticagrelor This list may not describe all possible interactions. Give your health care provider a list of all the medicines, herbs, non-prescription drugs, or  dietary supplements you use. Also tell them if   you smoke, drink alcohol, or use illegal drugs. Some items may interact with your medicine. What should I watch for while using this medication? Tell your care team if your pain does not go away, if it gets worse, or if you have new or a different type of pain. You may develop tolerance to this medication. Tolerance means that you will need a higher dose of the medication for pain relief. Tolerance is normal and is expected if you take this medication for a long time. There are different types of narcotic medications (opioids) for pain. If you take more than one type at the same time, you may have more side effects. Give your care team a list of all medications you use. He or she will tell you how much medication to take. Do not take more medication than directed. Get emergency help right away if you have problems breathing. Do not suddenly stop taking your medication because you may develop a severe reaction. Your body becomes used to the medication. This does NOT mean you are addicted. Addiction is a behavior related to getting and using a medication for a nonmedical reason. If you have pain, you have a medical reason to take pain medication. Your care team will tell you how much medication to take. If your care team wants you to stop the medication, the dose will be slowly lowered over time to avoid any side effects. Talk to your care team about naloxone and how to get it. Naloxone is an emergency medication used for an opioid overdose. An overdose can happen if you take too much opioid. It can also happen if an opioid is taken with some other medications or substances, like alcohol. Know the symptoms of an overdose, like trouble breathing, unusually tired or sleepy, or not being able to respond or wake up. Make sure to tell caregivers and close contacts where it is stored. Make sure they know how to use it. After naloxone is given, you must get emergency help  right away. Naloxone is a temporary treatment. Repeat doses may be needed. You may get drowsy or dizzy. Do not drive, use machinery, or do anything that needs mental alertness until you know how this medication affects you. Do not stand up or sit up quickly, especially if you are an older patient. This reduces the risk of dizzy or fainting spells. Alcohol may interfere with the effect of this medication. Avoid alcoholic drinks. This medication will cause constipation. If you do not have a bowel movement for 3 days, call your care team. Your mouth may get dry. Chewing sugarless gum or sucking hard candy and drinking plenty of water may help. Contact your care team if the problem does not go away or is severe. What side effects may I notice from receiving this medication? Side effects that you should report to your care team as soon as possible: Allergic reactions--skin rash, itching, hives, swelling of the face, lips, tongue, or throat CNS depression--slow or shallow breathing, shortness of breath, feeling faint, dizziness, confusion, difficulty staying awake Low adrenal gland function--nausea, vomiting, loss of appetite, unusual weakness or fatigue, dizziness Low blood pressure--dizziness, feeling faint or lightheaded, blurry vision Side effects that usually do not require medical attention (report to your care team if they continue or are bothersome): Constipation Dizziness Drowsiness Dry mouth Headache Nausea Vomiting This list may not describe all possible side effects. Call your doctor for medical advice about side effects. You may report side effects to FDA at 1-800-FDA-1088.   Where should I keep my medication? Keep out of the reach of children and pets. This medication can be abused. Keep it in a safe place to protect it from theft. Do not share this medication with anyone. Selling or giving away this medication is dangerous and is against the law. If you are using this medication at home,  you will be instructed on how to store this medication. Throw away any unused medication after the expiration date on the label. Discard unused medication and used packaging carefully. Children and pets can be harmed if they find used or lost packages. NOTE: This sheet is a summary. It may not cover all possible information. If you have questions about this medicine, talk to your doctor, pharmacist, or health care provider.  2023 Elsevier/Gold Standard (2020-10-02 00:00:00)  

## 2022-03-29 NOTE — Telephone Encounter (Signed)
Spoke with patient to advise that [facility] had been called into her [facility] in [place]. They will have to order medication but it should be available 7/26 and at latest 7/27. Spoke with pharmacist and they have a co-pay card which will drop price to $45. Pt aware.  Palliative care referral placed per Murray Hodgkins PA for goals of care discussion and pain management.

## 2022-03-30 ENCOUNTER — Other Ambulatory Visit: Payer: Self-pay | Admitting: Physician Assistant

## 2022-03-30 ENCOUNTER — Telehealth: Payer: Self-pay | Admitting: *Deleted

## 2022-03-30 ENCOUNTER — Telehealth: Payer: Self-pay

## 2022-03-30 ENCOUNTER — Encounter: Payer: Self-pay | Admitting: Hematology and Oncology

## 2022-03-30 ENCOUNTER — Telehealth: Payer: Self-pay | Admitting: Hematology and Oncology

## 2022-03-30 DIAGNOSIS — Z933 Colostomy status: Secondary | ICD-10-CM | POA: Diagnosis not present

## 2022-03-30 DIAGNOSIS — C20 Malignant neoplasm of rectum: Secondary | ICD-10-CM | POA: Diagnosis not present

## 2022-03-30 LAB — URINE CULTURE: Culture: <10000 — AB

## 2022-03-30 MED ORDER — SULFAMETHOXAZOLE-TRIMETHOPRIM 800-160 MG PO TABS: 1.0000 | ORAL_TABLET | Freq: Two times a day (BID) | ORAL | 0 refills | Status: DC

## 2022-03-30 NOTE — Telephone Encounter (Signed)
Received vm message from pt asking is her immunotherapy treatment has been approved by her insurance company. Returned call to patient and spoke to her. Advised that her Mariah Schultz has been approved. Advised that I have sent a high priority scheduling message to get her scheduled this week hopefully. Advised that a scheduler should be calling her today. She voiced understanding. She is waiting for her pharmacy to get her [facility] in [place] today or tomorrow.  She is keeping her pain undr control fairly well with oxycodone and tylenol

## 2022-03-30 NOTE — Telephone Encounter (Signed)
.  Called patient to schedule appointment per 7/26 inbasket, patient is aware of date and time.   

## 2022-03-30 NOTE — Progress Notes (Signed)
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 Findings showed a 7.5 x 6.9 cm heterogeneously enhancing mass is noted posteriorly in the pelvis anterior to the sacrum which is highly concerning for colorectal malignancy with possible adjacent metastatic adenopathy. 05/24/2021: colonoscopy performed by [facility]. Biopsy showed poorly differentiated high grade carcinoma, differential including tuboovarian and peritoneal. Immunophemotype not consistent with colorectal adenocarcinoma.  06/04/2021: PET CT scan performed, findings show hypermetabolic mass adjacent to the rectum, likely due to colorectal primary neoplasm and ill-defined hypermetabolic nodule is seen more inferiorly in the left perirectal fat, likely a lymph node metastasis. She was also noted to have Numerous small bilateral small solid pulmonary nodules which are below resolution for PET-CT 06/11/2021: establish care with Dr. Lorenso Courier  06/22/2021: Laparoscopic diagnostic procedure with diverting loop colostomy and anorectal exam under anesthesia.  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 07/19/2021: Start of Radiation therapy.  07/20/2021: Week 1 of Cisplatin $RemoveBefo'40mg'DksXkvJuswn$ /m2 07/27/2021: Week 2 of Cisplatin $RemoveBefo'40mg'UnxKAFqVQnP$ /m2 08/02/2021: Week 3 of  Cisplatin $RemoveBe'40mg'OyivsHEgG$ /m2 08/09/2021: Week 4 of Cisplatin $RemoveBefo'40mg'ysHmrnEfHuX$ /m2 08/17/2021: Week 5 of Cisplatin $RemoveBefo'40mg'tCOEMfOHHie$ /m2 08/24/2021: Week 6 of Cisplatin $RemoveBefo'40mg'ASQeCkMaGeB$ /m2 08/31/2021:  Week 7 of Cisplatin $RemoveBefo'40mg'PfhInbfEwBp$ /m2 09/01/2021: end of chemoradiation.  10/16/2021: MRI Pelvis showed response to therapy of the soft tissue mass centered about the right-side of the rectosigmoid junction 02/01/2022: MRI Pelvis showed mild enlargement of the mass measuring 6.0 x 4.4 cm on 13/3 versus 5.9 x 3.5 cm on the prior exam.  03/20/2022: CT renal study: 11.1 x 7 cm soft tissue mass in pelvic cavity causing high grade obstruction of the distal right ureter.  03/21/2022-03/25/2022: Admitted due to worsening back pain, abdominal pain, rectal pressure and hematuria. Underwent cystoscopy and right ureteral stent placement.   Interval History:  Mariah Schultz 66 y.o. female with medical history significant for squamous cell cancer of unclear etiology who presents for a follow up visit. The patient's last visit was on 02/15/2022. In the interim since the last visit she CT scan that showed progression of soft tissue pelvic mass.   On exam today Mrs. Pinkhasov reports has been having some persistent low pelvic pain which prevents her from sitting in a comfortable position. She is currently taking oxycodone 10 mg ever 3-4 hours with breakthrough pain. She adds that she has noticed dark urine and pain with urination. She reports energy levels are fairly stable. She denies any appetite changes or weight loss. She denies nausea, vomiting, and has good output from her ostomy.  She denies fevers, chills, night sweats, shortness of breath, chest pain, cough, peripheral edema or neuropathy.  A full 10 point ROS is listed below.   MEDICAL HISTORY:  Past Medical History:  Diagnosis Date   CAP (community acquired pneumonia) 09/10/2013   History of radiation therapy    pelvis 07/19/2021-09/01/2021  Dr Gery Pray   Hypertension    Papilloma of left breast    Papilloma  of left breast 02/26/2019   Perforated  appendicitis 03/19/2013    SURGICAL HISTORY: Past Surgical History:  Procedure Laterality Date   ABDOMINAL HYSTERECTOMY     BREAST EXCISIONAL BIOPSY Left 02/26/2019   B9 Biopsy   BREAST LUMPECTOMY WITH RADIOACTIVE SEED LOCALIZATION Left 02/26/2019   Procedure: LEFT BREAST LUMPECTOMY WITH RADIOACTIVE SEED LOCALIZATION;  Surgeon: Fanny Skates, MD;  Location: [place];  Service: General;  Laterality: Left;   CYSTOSCOPY W/ URETERAL STENT PLACEMENT Right 03/22/2022   Procedure: CYSTOSCOPY WITH BILATERAL RETROGRADE PYELOGRAM/ RIGHT URETERAL STENT PLACEMENT;  Surgeon: Lucas Mallow, MD;  Location: [facility];  Service: Urology;  Laterality: Right;   IR IMAGING GUIDED PORT INSERTION  07/21/2021   LAPAROSCOPIC APPENDECTOMY N/A 02/25/2013   Procedure: APPENDECTOMY LAPAROSCOPIC;  Surgeon: Harl Bowie, MD;  Location: [place];  Service: General;  Laterality: N/A;   LAPAROSCOPY N/A 06/22/2021   Procedure: LAPAROSCOPY DIAGNOSTIC, DIVERTING LOOP COLOSTOMY, ANORECTAL EXAM UNDER ANESTHESIA, TRANSRECTAL CORE BIOPSIES.;  Surgeon: Lafonda Mosses, MD;  Location: [facility];  Service: Gynecology;  Laterality: N/A;    SOCIAL HISTORY: Social History   Socioeconomic History   Marital status: Married    Spouse name: Not on file   Number of children: Not on file   Years of education: Not on file   Highest education level: Not on file  Occupational History   Not on file  Tobacco Use   Smoking status: Every Day    Packs/day: 1.00    Years: 40.00    Total pack years: 40.00    Types: Cigarettes   Smokeless tobacco: Never  Vaping Use   Vaping Use: Never used  Substance and Sexual Activity   Alcohol use: Yes    Comment: occ   Drug use: No   Sexual activity: Not Currently    Birth control/protection: Surgical  Other Topics Concern   Not on file  Social History Narrative   Not on file   Social Determinants of Health   Financial  Resource Strain: Low Risk  (06/16/2021)   Overall Financial Resource Strain (CARDIA)    Difficulty of Paying Living Expenses: Not very hard  Food Insecurity: No Food Insecurity (06/16/2021)   Hunger Vital Sign    Worried About Running Out of Food in the Last Year: Never true    Ran Out of Food in the Last Year: Never true  Transportation Needs: No Transportation Needs (06/16/2021)   PRAPARE - Hydrologist (Medical): No    Lack of Transportation (Non-Medical): No  Physical Activity: Not on file  Stress: Not on file  Social Connections: Not on file  Intimate Partner Violence: Not on file    FAMILY HISTORY: Family History  Problem Relation Age of Onset   Heart disease Father    Colon cancer Neg Hx    Breast cancer Neg Hx    Ovarian cancer Neg Hx    Pancreatic cancer Neg Hx    Prostate cancer Neg Hx    Endometrial cancer Neg Hx        Patient unaware    ALLERGIES:  is allergic to other and pneumococcal vac polyvalent.  MEDICATIONS:  Current Outpatient Medications  Medication Sig Dispense Refill   oxyCODONE ER (XTAMPZA ER) 9 MG C12A Take 9 mg by mouth every 12 (twelve) hours as needed. 60 capsule 0   acetaminophen (TYLENOL) 500 MG tablet Take 500 mg by mouth 3 (three) times daily as needed (pain).     gabapentin (NEURONTIN) 100 MG capsule Take  100 mg by mouth 2 (two) times daily. (Patient not taking: Reported on 03/22/2022)     lidocaine-prilocaine (EMLA) cream Apply 1 application topically as needed. (Patient taking differently: Apply 1 application  topically as needed (pain).) 30 g 0   Multiple Vitamin (MULTIVITAMIN) tablet Take 1 tablet by mouth daily.     Omega-3 Fatty Acids (FISH OIL PO) Take 1 capsule by mouth daily.     ondansetron (ZOFRAN) 8 MG tablet Take 1 tablet (8 mg total) by mouth every 8 (eight) hours as needed. (Patient taking differently: Take 8 mg by mouth as needed for vomiting or nausea.) 30 tablet 0   oxyCODONE 10 MG TABS Take 1  tablet (10 mg total) by mouth every 4 (four) hours as needed for up to 7 days for moderate pain. 42 tablet 0   polyethylene glycol (MIRALAX / GLYCOLAX) 17 g packet Take 17 g by mouth daily.     prochlorperazine (COMPAZINE) 10 MG tablet Take 1 tablet (10 mg total) by mouth every 6 (six) hours as needed for nausea or vomiting. (Patient not taking: Reported on 02/15/2022) 30 tablet 0   sulfamethoxazole-trimethoprim (BACTRIM DS) 800-160 MG tablet Take 1 tablet by mouth 2 (two) times daily. 14 tablet 0   valsartan-hydrochlorothiazide (DIOVAN-HCT) 160-12.5 MG tablet Take 1 tablet by mouth daily.     vitamin C (ASCORBIC ACID) 500 MG tablet Take 500 mg by mouth daily.     No current facility-administered medications for this visit.    REVIEW OF SYSTEMS:   Constitutional: ( - ) fevers, ( - )  chills , ( - ) night sweats Eyes: ( - ) blurriness of vision, ( - ) double vision, ( - ) watery eyes Ears, nose, mouth, throat, and face: ( - ) mucositis, ( - ) sore throat Respiratory: ( - ) cough, ( - ) dyspnea, ( - ) wheezes Cardiovascular: ( - ) palpitation, ( - ) chest discomfort, ( - ) lower extremity swelling Gastrointestinal:  ( - ) nausea, ( - ) heartburn, ( - ) change in bowel habits Skin: ( - ) abnormal skin rashes Lymphatics: ( - ) new lymphadenopathy, ( - ) easy bruising Neurological: ( - ) numbness, ( - ) tingling, ( - ) new weaknesses Behavioral/Psych: ( - ) mood change, ( - ) new changes  All other systems were reviewed with the patient and are negative.  PHYSICAL EXAMINATION: ECOG PERFORMANCE STATUS: 1 - Symptomatic but completely ambulatory  Vitals:   03/29/22 1418  BP: 130/70  Pulse: 82  Resp: 18  Temp: (!) 97.5 F (36.4 C)  SpO2: 95%      Filed Weights   03/29/22 1418  Weight: 164 lb 11.2 oz (74.7 kg)     GENERAL: Well-appearing middle-aged African-American female, alert, uncomfortable due to pain.  SKIN: skin color, texture, turgor are normal, no rashes or significant  lesions EYES: conjunctiva are pink and non-injected, sclera clear LUNGS: clear to auscultation and percussion with normal breathing effort HEART: regular rate & rhythm and no murmurs and no lower extremity edema ABDOMEN: Ostomy in place producing soft stool. Did not examine abdomen due to ongoing pain.  Musculoskeletal: no cyanosis of digits and no clubbing  PSYCH: alert & oriented x 3, fluent speech NEURO: no focal motor/sensory deficits  LABORATORY DATA:  I have reviewed the data as listed    Latest Ref Rng & Units 03/29/2022    1:44 PM 03/25/2022   10:28 AM 03/24/2022   12:16 PM  CBC  WBC 4.0 - 10.5 K/uL 15.3  9.8    Hemoglobin 12.0 - 15.0 g/dL 10.3  7.0  7.5   Hematocrit 36.0 - 46.0 % 30.9  21.7  22.8   Platelets 150 - 400 K/uL 360  361         Latest Ref Rng & Units 03/29/2022    1:44 PM 03/25/2022    5:00 AM 03/24/2022    5:00 AM  CMP  Glucose 70 - 99 mg/dL 157  97  102   BUN 8 - 23 mg/dL $Remove'27  22  31   'Yxkitui$ Creatinine 0.44 - 1.00 mg/dL 1.46  1.33  1.10   Sodium 135 - 145 mmol/L 136  140  141   Potassium 3.5 - 5.1 mmol/L 4.0  3.9  3.9   Chloride 98 - 111 mmol/L 98  109  113   CO2 22 - 32 mmol/L $RemoveB'28  25  23   'OGpjyRKk$ Calcium 8.9 - 10.3 mg/dL 9.4  8.6  8.4   Total Protein 6.5 - 8.1 g/dL 7.4     Total Bilirubin 0.3 - 1.2 mg/dL 0.4     Alkaline Phos 38 - 126 U/L 151     AST 15 - 41 U/L 20     ALT 0 - 44 U/L 19       RADIOGRAPHIC STUDIES: DG C-Arm 1-60 Min-No Report  Result Date: 03/22/2022 Fluoroscopy was utilized by the requesting physician.  No radiographic interpretation.   CT Renal Stone Study  Result Date: 03/20/2022 CLINICAL DATA:  Flank pain, hematuria EXAM: CT ABDOMEN AND PELVIS WITHOUT CONTRAST TECHNIQUE: Multidetector CT imaging of the abdomen and pelvis was performed following the standard protocol without IV contrast. RADIATION DOSE REDUCTION: This exam was performed according to the departmental dose-optimization program which includes automated exposure control,  adjustment of the mA and/or kV according to patient size and/or use of iterative reconstruction technique. COMPARISON:  05/11/2021 FINDINGS: Lower chest: Small linear densities in the lower lung fields suggest scarring or subsegmental atelectasis. There is ectasia of bronchi. Hepatobiliary: Liver is enlarged measuring 19.6 cm in length. No focal abnormalities are seen. Gallbladder is unremarkable. Pancreas: There is atrophy and pancreas. No focal abnormalities are seen. Spleen: Unremarkable. Adrenals/Urinary Tract: Adrenals are unremarkable. There is no hydronephrosis on the left side. There is moderate to marked right hydronephrosis. There is dilation of right ureter to the level of pelvic inlet. There are no opaque renal or ureteral stones. There is a soft tissue mass measuring 11.1 x 7 cm in size and pelvis extending more to the right. Left ureter is not dilated. Urinary bladder is not distended. Stomach/Bowel: Stomach is unremarkable. Small bowel loops are not dilated. The appendix is not seen. Colostomy is noted in left mid abdomen. Diverticula are seen in colon without signs of focal diverticulitis. Vascular/Lymphatic: Scattered arterial calcifications are seen. Reproductive: Uterus could not be distinctly identified. Other: There is no ascites or pneumoperitoneum. Small bilateral inguinal hernias containing fat are noted. Small umbilical hernia containing fat is seen. Musculoskeletal: There is minimal anterolisthesis at L4-L5 level. IMPRESSION: There is no evidence of intestinal obstruction or pneumoperitoneum. There is interval appearance of moderate to severe right hydronephrosis. There are no demonstrable opaque renal or ureteral stones. There is 11.1 x 7 cm soft tissue mass in pelvic cavity extending more to the right. Findings suggest possible high-grade obstruction of the distal right ureter caused by extrinsic compression or infiltration by the malignant neoplasm in pelvic cavity. Diverticulosis of  colon  without signs of focal diverticulitis. 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 Other findings as described in the body of the report. Electronically Signed   By: Elmer Picker M.D.   On: 03/20/2022 14:42    ASSESSMENT & PLAN Tashari Schoenfelder 66 y.o. female with medical history significant for squamous cell cancer of unclear etiology who presents for a follow up visit.   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  --MRI on 10/18/2021 showed marked response to therapy, with the mass decreasing in size to 3.5 x 5.9 from 7.2 x 8.4 cm.  --MRI on 02/01/2022 mass increased modestly to 6.0 x 4.4 cm  --CT renal showed increase in mass measuring 11.1 x 7 cm causing high grade obstruction of the distal right ureter.  --Recommended single agent Keytruda 200 mg q 21 days as she has PD-L1 positive tumor with CPS 10% Plan: --Labs from today were reviewed with WBC 15.3, Hgb 10.3, Plt 360, Creatinine 1.46.  --Awaiting Keytruda authorization. Plan to start this week once we obtain authorization.  --RTC 3 weeks after starting [facility] for labs, f/u visit with Dr. Lorenso Courier before Cycle 2.   #Neuropathy --currently undergoing evaluation with neurological/vascular workup. --etiology is unclear, not likely related to chemotherapy as it began afterwards.   #Pain Control --Currently taking oxycodone 10 mg every 3-4 hours with minimal relief --Sent Xtampza 9 mg every 12 hours. --Sent referral to palliative care to help with continued pain management.   #Dysuria/Dark-colored urine: --Likely related to recently placed ureteral stent. --Due to rise in WBC, we will  obtain UA/culture to rule out UTI --If there is suspicion for UTI, we will send Bactrim x 7 days.   #Supportive Care -- chemotherapy education complete -- port placed -- zofran $RemoveB'8mg'RlGJCjgg$  q8H PRN and compazine $RemoveBefor'10mg'PNoCoxPCFFEb$  PO q6H for nausea -- EMLA cream for port -- pain medication as above  Orders Placed This Encounter  Procedures   Culture, Urine    Standing Status:   Future    Number of Occurrences:   1    Standing Expiration Date:   03/29/2023   Urinalysis, Complete w Microscopic    Standing Status:   Future    Number of Occurrences:   1    Standing Expiration Date:   03/30/2023    All questions were answered. The patient knows to call the clinic with any problems, questions or concerns.  I have spent a total of 30 minutes minutes of face-to-face and non-face-to-face time, preparing to see the patient, performing a medically appropriate examination, counseling and educating the patient, ordering medications documenting clinical information in the electronic health record, and care coordination.   Dede Query PA-C Dept of Hematology and [place] [facility] Phone: [phone]   03/30/2022 4:48 PM

## 2022-03-30 NOTE — Telephone Encounter (Signed)
Can you call patient and let her know that the preliminary urine test are concerning for UTI so I will send antibiotics. I will follow up once urine culture finalizes.   Pt advised of urine test results and rx with [facility]

## 2022-04-01 ENCOUNTER — Observation Stay (HOSPITAL_COMMUNITY): Payer: Medicare HMO

## 2022-04-01 ENCOUNTER — Other Ambulatory Visit: Payer: Self-pay

## 2022-04-01 ENCOUNTER — Inpatient Hospital Stay (HOSPITAL_BASED_OUTPATIENT_CLINIC_OR_DEPARTMENT_OTHER): Payer: Medicare HMO

## 2022-04-01 ENCOUNTER — Inpatient Hospital Stay: Payer: Medicare HMO

## 2022-04-01 ENCOUNTER — Inpatient Hospital Stay (HOSPITAL_COMMUNITY)
Admission: AD | Admit: 2022-04-01 | Discharge: 2022-04-19 | DRG: 683 | Disposition: A | Discharge status: Hospice | Payer: Medicare HMO | Source: Ambulatory Visit | Attending: Internal Medicine | Admitting: Internal Medicine

## 2022-04-01 DIAGNOSIS — Z66 Do not resuscitate: Secondary | ICD-10-CM | POA: Diagnosis not present

## 2022-04-01 DIAGNOSIS — D62 Acute posthemorrhagic anemia: Secondary | ICD-10-CM | POA: Diagnosis present

## 2022-04-01 DIAGNOSIS — K56609 Unspecified intestinal obstruction, unspecified as to partial versus complete obstruction: Secondary | ICD-10-CM

## 2022-04-01 DIAGNOSIS — Z86018 Personal history of other benign neoplasm: Secondary | ICD-10-CM | POA: Diagnosis not present

## 2022-04-01 DIAGNOSIS — Z933 Colostomy status: Secondary | ICD-10-CM | POA: Diagnosis not present

## 2022-04-01 DIAGNOSIS — G893 Neoplasm related pain (acute) (chronic): Secondary | ICD-10-CM | POA: Diagnosis present

## 2022-04-01 DIAGNOSIS — R140 Abdominal distension (gaseous): Secondary | ICD-10-CM | POA: Diagnosis not present

## 2022-04-01 DIAGNOSIS — I10 Essential (primary) hypertension: Secondary | ICD-10-CM | POA: Diagnosis not present

## 2022-04-01 DIAGNOSIS — R600 Localized edema: Secondary | ICD-10-CM | POA: Diagnosis present

## 2022-04-01 DIAGNOSIS — C763 Malignant neoplasm of pelvis: Secondary | ICD-10-CM | POA: Diagnosis present

## 2022-04-01 DIAGNOSIS — A419 Sepsis, unspecified organism: Principal | ICD-10-CM | POA: Diagnosis present

## 2022-04-01 DIAGNOSIS — K6389 Other specified diseases of intestine: Secondary | ICD-10-CM | POA: Diagnosis not present

## 2022-04-01 DIAGNOSIS — D630 Anemia in neoplastic disease: Secondary | ICD-10-CM | POA: Diagnosis present

## 2022-04-01 DIAGNOSIS — I129 Hypertensive chronic kidney disease with stage 1 through stage 4 chronic kidney disease, or unspecified chronic kidney disease: Secondary | ICD-10-CM | POA: Diagnosis present

## 2022-04-01 DIAGNOSIS — C4492 Squamous cell carcinoma of skin, unspecified: Secondary | ICD-10-CM | POA: Diagnosis not present

## 2022-04-01 DIAGNOSIS — R8281 Pyuria: Secondary | ICD-10-CM

## 2022-04-01 DIAGNOSIS — Z515 Encounter for palliative care: Secondary | ICD-10-CM

## 2022-04-01 DIAGNOSIS — K5669 Other partial intestinal obstruction: Secondary | ICD-10-CM | POA: Diagnosis not present

## 2022-04-01 DIAGNOSIS — Z9049 Acquired absence of other specified parts of digestive tract: Secondary | ICD-10-CM | POA: Diagnosis not present

## 2022-04-01 DIAGNOSIS — C44529 Squamous cell carcinoma of skin of other part of trunk: Secondary | ICD-10-CM | POA: Diagnosis not present

## 2022-04-01 DIAGNOSIS — Z8249 Family history of ischemic heart disease and other diseases of the circulatory system: Secondary | ICD-10-CM

## 2022-04-01 DIAGNOSIS — R319 Hematuria, unspecified: Secondary | ICD-10-CM | POA: Diagnosis present

## 2022-04-01 DIAGNOSIS — C189 Malignant neoplasm of colon, unspecified: Secondary | ICD-10-CM | POA: Insufficient documentation

## 2022-04-01 DIAGNOSIS — Z923 Personal history of irradiation: Secondary | ICD-10-CM | POA: Diagnosis not present

## 2022-04-01 DIAGNOSIS — Z4682 Encounter for fitting and adjustment of non-vascular catheter: Secondary | ICD-10-CM | POA: Diagnosis not present

## 2022-04-01 DIAGNOSIS — C539 Malignant neoplasm of cervix uteri, unspecified: Secondary | ICD-10-CM | POA: Diagnosis present

## 2022-04-01 DIAGNOSIS — E876 Hypokalemia: Secondary | ICD-10-CM | POA: Diagnosis not present

## 2022-04-01 DIAGNOSIS — N1831 Chronic kidney disease, stage 3a: Secondary | ICD-10-CM | POA: Diagnosis present

## 2022-04-01 DIAGNOSIS — Z8719 Personal history of other diseases of the digestive system: Secondary | ICD-10-CM

## 2022-04-01 DIAGNOSIS — R3129 Other microscopic hematuria: Secondary | ICD-10-CM

## 2022-04-01 DIAGNOSIS — E860 Dehydration: Secondary | ICD-10-CM

## 2022-04-01 DIAGNOSIS — D631 Anemia in chronic kidney disease: Secondary | ICD-10-CM | POA: Diagnosis present

## 2022-04-01 DIAGNOSIS — Z7189 Other specified counseling: Secondary | ICD-10-CM

## 2022-04-01 DIAGNOSIS — Z466 Encounter for fitting and adjustment of urinary device: Secondary | ICD-10-CM | POA: Diagnosis not present

## 2022-04-01 DIAGNOSIS — Z4659 Encounter for fitting and adjustment of other gastrointestinal appliance and device: Secondary | ICD-10-CM | POA: Diagnosis not present

## 2022-04-01 DIAGNOSIS — F17210 Nicotine dependence, cigarettes, uncomplicated: Secondary | ICD-10-CM | POA: Diagnosis present

## 2022-04-01 DIAGNOSIS — E871 Hypo-osmolality and hyponatremia: Secondary | ICD-10-CM | POA: Diagnosis present

## 2022-04-01 DIAGNOSIS — N179 Acute kidney failure, unspecified: Secondary | ICD-10-CM | POA: Diagnosis present

## 2022-04-01 DIAGNOSIS — R531 Weakness: Secondary | ICD-10-CM | POA: Diagnosis present

## 2022-04-01 DIAGNOSIS — K5939 Other megacolon: Secondary | ICD-10-CM | POA: Diagnosis not present

## 2022-04-01 DIAGNOSIS — N136 Pyonephrosis: Secondary | ICD-10-CM | POA: Diagnosis present

## 2022-04-01 DIAGNOSIS — R109 Unspecified abdominal pain: Secondary | ICD-10-CM | POA: Diagnosis not present

## 2022-04-01 DIAGNOSIS — R3911 Hesitancy of micturition: Secondary | ICD-10-CM | POA: Diagnosis present

## 2022-04-01 DIAGNOSIS — N1330 Unspecified hydronephrosis: Secondary | ICD-10-CM | POA: Diagnosis not present

## 2022-04-01 DIAGNOSIS — Z8701 Personal history of pneumonia (recurrent): Secondary | ICD-10-CM

## 2022-04-01 DIAGNOSIS — R6520 Severe sepsis without septic shock: Secondary | ICD-10-CM | POA: Diagnosis present

## 2022-04-01 DIAGNOSIS — N2889 Other specified disorders of kidney and ureter: Secondary | ICD-10-CM | POA: Diagnosis not present

## 2022-04-01 DIAGNOSIS — K6289 Other specified diseases of anus and rectum: Secondary | ICD-10-CM

## 2022-04-01 DIAGNOSIS — Z79899 Other long term (current) drug therapy: Secondary | ICD-10-CM

## 2022-04-01 DIAGNOSIS — R609 Edema, unspecified: Secondary | ICD-10-CM | POA: Diagnosis not present

## 2022-04-01 DIAGNOSIS — R7989 Other specified abnormal findings of blood chemistry: Secondary | ICD-10-CM | POA: Diagnosis present

## 2022-04-01 DIAGNOSIS — Z90710 Acquired absence of both cervix and uterus: Secondary | ICD-10-CM | POA: Diagnosis not present

## 2022-04-01 DIAGNOSIS — Z7401 Bed confinement status: Secondary | ICD-10-CM | POA: Diagnosis not present

## 2022-04-01 DIAGNOSIS — Z95828 Presence of other vascular implants and grafts: Secondary | ICD-10-CM

## 2022-04-01 DIAGNOSIS — Z887 Allergy status to serum and vaccine status: Secondary | ICD-10-CM

## 2022-04-01 LAB — CMP (CANCER CENTER ONLY)
ALT: 19 U/L (ref 0–44)
AST: 17 U/L (ref 15–41)
Albumin: 3.5 g/dL (ref 3.5–5.0)
Alkaline Phosphatase: 198 U/L — ABNORMAL HIGH (ref 38–126)
Anion gap: 11 (ref 5–15)
BUN: 49 mg/dL — ABNORMAL HIGH (ref 8–23)
CO2: 25 mmol/L (ref 22–32)
Calcium: 9.4 mg/dL (ref 8.9–10.3)
Chloride: 95 mmol/L — ABNORMAL LOW (ref 98–111)
Creatinine: 2.26 mg/dL — ABNORMAL HIGH (ref 0.44–1.00)
GFR, Estimated: 23 mL/min — ABNORMAL LOW (ref >60)
Glucose, Bld: 97 mg/dL (ref 70–99)
Potassium: 4.2 mmol/L (ref 3.5–5.1)
Sodium: 131 mmol/L — ABNORMAL LOW (ref 135–145)
Total Bilirubin: 0.3 mg/dL (ref 0.3–1.2)
Total Protein: 7.8 g/dL (ref 6.5–8.1)

## 2022-04-01 LAB — SAMPLE TO BLOOD BANK

## 2022-04-01 LAB — CBC WITH DIFFERENTIAL (CANCER CENTER ONLY)
Abs Immature Granulocytes: 0.05 10*3/uL (ref 0.00–0.07)
Basophils Absolute: 0 10*3/uL (ref 0.0–0.1)
Basophils Relative: 0 %
Eosinophils Absolute: 0.1 10*3/uL (ref 0.0–0.5)
Eosinophils Relative: 1 %
HCT: 28.6 % — ABNORMAL LOW (ref 36.0–46.0)
Hemoglobin: 9.8 g/dL — ABNORMAL LOW (ref 12.0–15.0)
Immature Granulocytes: 0 %
Lymphocytes Relative: 3 %
Lymphs Abs: 0.4 10*3/uL — ABNORMAL LOW (ref 0.7–4.0)
MCH: 30.3 pg (ref 26.0–34.0)
MCHC: 34.3 g/dL (ref 30.0–36.0)
MCV: 88.5 fL (ref 80.0–100.0)
Monocytes Absolute: 0.8 10*3/uL (ref 0.1–1.0)
Monocytes Relative: 6 %
Neutro Abs: 12.5 10*3/uL — ABNORMAL HIGH (ref 1.7–7.7)
Neutrophils Relative %: 90 %
Platelet Count: 398 10*3/uL (ref 150–400)
RBC: 3.23 MIL/uL — ABNORMAL LOW (ref 3.87–5.11)
RDW: 15.1 % (ref 11.5–15.5)
WBC Count: 13.9 10*3/uL — ABNORMAL HIGH (ref 4.0–10.5)
nRBC: 0 % (ref 0.0–0.2)

## 2022-04-01 MED ORDER — HYDROMORPHONE HCL 1 MG/ML IJ SOLN
0.5000 mg | INTRAMUSCULAR | Status: DC | PRN
  Administered 2022-04-01 – 2022-04-03 (×10): 0.5 mg via INTRAVENOUS
  Filled 2022-04-01 (×11): qty 0.5

## 2022-04-01 MED ORDER — PROCHLORPERAZINE EDISYLATE 10 MG/2ML IJ SOLN
10.0000 mg | Freq: Four times a day (QID) | INTRAMUSCULAR | Status: DC | PRN
  Administered 2022-04-01 – 2022-04-06 (×3): 10 mg via INTRAVENOUS
  Filled 2022-04-01 (×3): qty 2

## 2022-04-01 MED ORDER — ONDANSETRON HCL 4 MG/2ML IJ SOLN
8.0000 mg | Freq: Once | INTRAMUSCULAR | Status: AC
  Administered 2022-04-01: 8 mg via INTRAVENOUS
  Filled 2022-04-01: qty 4

## 2022-04-01 MED ORDER — HYDROMORPHONE HCL 1 MG/ML IJ SOLN
1.0000 mg | INTRAMUSCULAR | Status: AC
  Administered 2022-04-01: 1 mg via INTRAVENOUS
  Filled 2022-04-01: qty 1

## 2022-04-01 MED ORDER — OXYCODONE HCL 5 MG PO TABS
10.0000 mg | ORAL_TABLET | Freq: Four times a day (QID) | ORAL | Status: DC | PRN
  Administered 2022-04-01 – 2022-04-02 (×4): 10 mg via ORAL
  Filled 2022-04-01 (×5): qty 2

## 2022-04-01 MED ORDER — SODIUM CHLORIDE 0.9% FLUSH
10.0000 mL | Freq: Once | INTRAVENOUS | Status: AC
  Administered 2022-04-01: 10 mL

## 2022-04-01 MED ORDER — OXYCODONE HCL 10 MG PO TABS: 10.0000 mg | ORAL_TABLET | ORAL | Status: DC | PRN

## 2022-04-01 MED ORDER — ONDANSETRON HCL 4 MG/2ML IJ SOLN
4.0000 mg | Freq: Four times a day (QID) | INTRAMUSCULAR | Status: DC | PRN
  Administered 2022-04-02 – 2022-04-04 (×3): 4 mg via INTRAVENOUS
  Filled 2022-04-01 (×4): qty 2

## 2022-04-01 MED ORDER — ACETAMINOPHEN 325 MG PO TABS
650.0000 mg | ORAL_TABLET | Freq: Four times a day (QID) | ORAL | Status: DC | PRN
  Administered 2022-04-01 – 2022-04-02 (×3): 650 mg via ORAL
  Filled 2022-04-01 (×4): qty 2

## 2022-04-01 MED ORDER — SODIUM CHLORIDE 0.9 % IV SOLN: Freq: Once | INTRAVENOUS | Status: AC

## 2022-04-01 MED ORDER — SODIUM CHLORIDE 0.9 % IV SOLN
1.0000 g | INTRAVENOUS | Status: AC
  Administered 2022-04-01 – 2022-04-05 (×5): 1 g via INTRAVENOUS
  Filled 2022-04-01 (×5): qty 10

## 2022-04-01 MED ORDER — SODIUM CHLORIDE 0.9 % IV SOLN: 8.0000 mg | Freq: Once | INTRAVENOUS | Status: DC

## 2022-04-01 MED ORDER — OXYCODONE HCL ER 10 MG PO T12A: 10.0000 mg | EXTENDED_RELEASE_TABLET | Freq: Two times a day (BID) | ORAL | Status: DC

## 2022-04-01 MED ORDER — ONDANSETRON HCL 4 MG PO TABS: 4.0000 mg | ORAL_TABLET | Freq: Four times a day (QID) | ORAL | Status: DC | PRN

## 2022-04-01 MED ORDER — ACETAMINOPHEN 650 MG RE SUPP: 650.0000 mg | Freq: Four times a day (QID) | RECTAL | Status: DC | PRN

## 2022-04-01 MED ORDER — POLYETHYLENE GLYCOL 3350 17 G PO PACK
17.0000 g | PACK | Freq: Two times a day (BID) | ORAL | Status: DC | PRN
  Administered 2022-04-02 – 2022-04-03 (×2): 17 g via ORAL
  Filled 2022-04-01: qty 1

## 2022-04-01 MED ORDER — SODIUM CHLORIDE 0.9 % IV BOLUS
1000.0000 mL | Freq: Once | INTRAVENOUS | Status: AC
  Administered 2022-04-01: 1000 mL via INTRAVENOUS

## 2022-04-01 MED ORDER — SODIUM CHLORIDE 0.9 % IV SOLN
200.0000 mg | Freq: Once | INTRAVENOUS | Status: AC
  Administered 2022-04-01: 200 mg via INTRAVENOUS
  Filled 2022-04-01: qty 8

## 2022-04-01 MED ORDER — SODIUM CHLORIDE 0.9 % IV SOLN: INTRAVENOUS | Status: AC

## 2022-04-01 MED ORDER — SENNOSIDES-DOCUSATE SODIUM 8.6-50 MG PO TABS
1.0000 | ORAL_TABLET | Freq: Two times a day (BID) | ORAL | Status: DC | PRN
  Administered 2022-04-03: 1 via ORAL
  Filled 2022-04-01: qty 1

## 2022-04-01 NOTE — Patient Instructions (Signed)
[facility]  Discharge Instructions: Thank you for choosing [facility] to provide your oncology and hematology care.   If you have a lab appointment with the [facility], please go directly to the [facility] and check in at the registration area.   Wear comfortable clothing and clothing appropriate for easy access to any Portacath or PICC line.   We strive to give you quality time with your provider. You may need to reschedule your appointment if you arrive late (15 or more minutes).  Arriving late affects you and other patients whose appointments are after yours.  Also, if you miss three or more appointments without notifying the office, you may be dismissed from the clinic at the provider's discretion.      For prescription refill requests, have your pharmacy contact our office and allow 72 hours for refills to be completed.    Today you received the following chemotherapy and/or immunotherapy agents Keytruda      To help prevent nausea and vomiting after your treatment, we encourage you to take your nausea medication as directed.  BELOW ARE SYMPTOMS THAT SHOULD BE REPORTED IMMEDIATELY: *FEVER GREATER THAN 100.4 F (38 C) OR HIGHER *CHILLS OR SWEATING *NAUSEA AND VOMITING THAT IS NOT CONTROLLED WITH YOUR NAUSEA MEDICATION *UNUSUAL SHORTNESS OF BREATH *UNUSUAL BRUISING OR BLEEDING *URINARY PROBLEMS (pain or burning when urinating, or frequent urination) *BOWEL PROBLEMS (unusual diarrhea, constipation, pain near the anus) TENDERNESS IN MOUTH AND THROAT WITH OR WITHOUT PRESENCE OF ULCERS (sore throat, sores in mouth, or a toothache) UNUSUAL RASH, SWELLING OR PAIN  UNUSUAL VAGINAL DISCHARGE OR ITCHING   Items with * indicate a potential emergency and should be followed up as soon as possible or go to the Emergency Department if any problems should occur.  Please show the CHEMOTHERAPY ALERT CARD or IMMUNOTHERAPY ALERT CARD at check-in to  the Emergency Department and triage nurse.  Should you have questions after your visit or need to cancel or reschedule your appointment, please contact [facility]  Dept: [phone]  and follow the prompts.  Office hours are 8:00 a.m. to 4:30 p.m. Monday - Friday. Please note that voicemails left after 4:00 p.m. may not be returned until the following business day.  We are closed weekends and major holidays. You have access to a nurse at all times for urgent questions. Please call the main number to the clinic Dept: [phone] and follow the prompts.   For any non-urgent questions, you may also contact your provider using MyChart. We now offer e-Visits for anyone 35 and older to request care online for non-urgent symptoms. For details visit [email or web address].   Also download the MyChart app! Go to the app store, search "MyChart", open the app, select [facility], and log in with your MyChart username and password.  Masks are optional in the cancer centers. If you would like for your care team to wear a mask while they are taking care of you, please let them know. For doctor visits, patients may have with them one support person who is at least 68 years old. At this time, visitors are not allowed in the infusion area. Pembrolizumab injection What is this medication? PEMBROLIZUMAB (pem broe liz ue mab) is a monoclonal antibody. It is used to treat certain types of cancer. This medicine may be used for other purposes; ask your health care provider or pharmacist if you have questions. COMMON BRAND NAME(S): Keytruda What should I  tell my care team before I take this medication? They need to know if you have any of these conditions: autoimmune diseases like Crohn's disease, ulcerative colitis, or lupus have had or planning to have an allogeneic stem cell transplant (uses someone else's stem cells) history of organ transplant history of chest radiation nervous  system problems like myasthenia gravis or Guillain-Barre syndrome an unusual or allergic reaction to pembrolizumab, other medicines, foods, dyes, or preservatives pregnant or trying to get pregnant breast-feeding How should I use this medication? This medicine is for infusion into a vein. It is given by a health care professional in a hospital or clinic setting. A special MedGuide will be given to you before each treatment. Be sure to read this information carefully each time. Talk to your pediatrician regarding the use of this medicine in children. While this drug may be prescribed for children as young as 6 months for selected conditions, precautions do apply. Overdosage: If you think you have taken too much of this medicine contact a poison control center or emergency room at once. NOTE: This medicine is only for you. Do not share this medicine with others. What if I miss a dose? It is important not to miss your dose. Call your doctor or health care professional if you are unable to keep an appointment. What may interact with this medication? Interactions have not been studied. This list may not describe all possible interactions. Give your health care provider a list of all the medicines, herbs, non-prescription drugs, or dietary supplements you use. Also tell them if you smoke, drink alcohol, or use illegal drugs. Some items may interact with your medicine. What should I watch for while using this medication? Your condition will be monitored carefully while you are receiving this medicine. You may need blood work done while you are taking this medicine. Do not become pregnant while taking this medicine or for 4 months after stopping it. Women should inform their doctor if they wish to become pregnant or think they might be pregnant. There is a potential for serious side effects to an unborn child. Talk to your health care professional or pharmacist for more information. Do not breast-feed an  infant while taking this medicine or for 4 months after the last dose. What side effects may I notice from receiving this medication? Side effects that you should report to your doctor or health care professional as soon as possible: allergic reactions like skin rash, itching or hives, swelling of the face, lips, or tongue bloody or black, tarry breathing problems changes in vision chest pain chills confusion constipation cough diarrhea dizziness or feeling faint or lightheaded fast or irregular heartbeat fever flushing joint pain low blood counts - this medicine may decrease the number of white blood cells, red blood cells and platelets. You may be at increased risk for infections and bleeding. muscle pain muscle weakness pain, tingling, numbness in the hands or feet persistent headache redness, blistering, peeling or loosening of the skin, including inside the mouth signs and symptoms of high blood sugar such as dizziness; dry mouth; dry skin; fruity breath; nausea; stomach pain; increased hunger or thirst; increased urination signs and symptoms of kidney injury like trouble passing urine or change in the amount of urine signs and symptoms of liver injury like dark urine, light-colored stools, loss of appetite, nausea, right upper belly pain, yellowing of the eyes or skin sweating swollen lymph nodes weight loss Side effects that usually do not require medical attention (report  to your doctor or health care professional if they continue or are bothersome): decreased appetite hair loss tiredness This list may not describe all possible side effects. Call your doctor for medical advice about side effects. You may report side effects to FDA at 1-800-FDA-1088. Where should I keep my medication? This drug is given in a hospital or clinic and will not be stored at home. NOTE: This sheet is a summary. It may not cover all possible information. If you have questions about this medicine,  talk to your doctor, pharmacist, or health care provider.  2023 Elsevier/Gold Standard (2021-07-23 00:00:00)

## 2022-04-01 NOTE — Progress Notes (Signed)
Per Dr. Lorenso Courier ok to get TSH with next cycle

## 2022-04-01 NOTE — Progress Notes (Signed)
Per Dr. Lorenso Courier ok to treat today with creatinine of 2.26.  Patient having uncontrolled pain with current pain medication. She has only been able to take medication twice since prescription was delayed at the pharmacy. Ok to administer '1mg'$  Dilaudid VO from Dr. Lorenso Courier at chairside.  Patient will be directly admitted to [facility] for elevated creatinine and difficulty producing urine/ difficulty urinating.    Port dressing changed and biopatch applied.

## 2022-04-01 NOTE — Progress Notes (Signed)
Pt to be admitted to 1601. Report called to [facility], RN Pt has completed her Beryle Flock. Port remains accessed. Transported in w/c .husband in attendance.

## 2022-04-01 NOTE — H&P (Addendum)
History and Physical    Mariah Schultz JWJ:[id number] DOB: 07/02/1956 DOA: 04/01/2022  PCP: Seward Carol, MD Patient coming from: Home.  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   Patient is being admitted from cancer center for weakness, dehydration and AKI per Dr. Libby Maw request.  Patient had nausea, dry heaving, decreased p.o. intake, low abdominal pain, worsening back pain, decreased urine output and urinary hesitancy for the last 3 days.  Back pain is similar to a cancer related pain.  She rates her pain 7/10.  No radiation to her legs.  Denies numbness or tingling.  Denies trauma or fall.  She has dry heaving but no emesis.  Last bowel movement 3 days ago.  She thinks she might be constipated.  She has ostomy bag.  She denies dysuria, fever, chills, cough, chest pain or shortness of breath.  Patient was seen at oncology office 3 days ago.  At that time, labs were drawn and significant for mild leukocytosis and worsening renal function.  Urinalysis with pyuria and hematuria.  Urine culture with insignificant growth.  She was started on Bactrim.   Patient lives with her husband.  Independently ambulates at baseline.  Smokes about half a pack to a pack a day.  Admits to occasional alcohol.  She denies recreational drug use.  Prefers to remain full code.  On arrival, stable vitals.  Labs from cancer center today showed Na 131, Cr 2.26 (1.3 on 7/21 and 1.46 on 7/25), BUN  49, ALP 198.  WBC 13.9 with left shift (was 15.3 on 7/25).  Hgb 9.8 (was 10.3 on 7/25, 7.5 on 7/20).    ROS All review of system negative except for pertinent positives and negatives as history of present illness above.  PMH Past Medical History:  Diagnosis Date   CAP (community acquired pneumonia) 09/10/2013   History of radiation therapy    pelvis 07/19/2021-09/01/2021  Dr Gery Pray   Hypertension    Papilloma of left breast    Papilloma of left breast 02/26/2019   Perforated appendicitis 03/19/2013   PSH Past Surgical History:  Procedure Laterality Date   ABDOMINAL HYSTERECTOMY     BREAST EXCISIONAL BIOPSY Left 02/26/2019   B9 Biopsy   BREAST LUMPECTOMY WITH RADIOACTIVE SEED LOCALIZATION Left 02/26/2019   Procedure: LEFT BREAST LUMPECTOMY WITH RADIOACTIVE SEED LOCALIZATION;  Surgeon: Fanny Skates, MD;  Location: [facility];  Service: General;  Laterality: Left;   CYSTOSCOPY W/ URETERAL STENT PLACEMENT Right 03/22/2022   Procedure: CYSTOSCOPY WITH BILATERAL RETROGRADE PYELOGRAM/ RIGHT URETERAL STENT PLACEMENT;  Surgeon: Lucas Mallow, MD;  Location: [facility];  Service: Urology;  Laterality: Right;   IR IMAGING GUIDED PORT INSERTION  07/21/2021   LAPAROSCOPIC APPENDECTOMY N/A 02/25/2013   Procedure: APPENDECTOMY LAPAROSCOPIC;  Surgeon: Harl Bowie, MD;  Location: [facility];  Service: General;  Laterality: N/A;   LAPAROSCOPY N/A 06/22/2021   Procedure: LAPAROSCOPY DIAGNOSTIC, DIVERTING LOOP COLOSTOMY, ANORECTAL EXAM UNDER ANESTHESIA, TRANSRECTAL CORE BIOPSIES.;  Surgeon: Lafonda Mosses, MD;  Location: [facility];  Service:  Gynecology;  Laterality: N/A;   Fam HX Family History  Problem Relation Age of Onset   Heart disease Father    Colon cancer Neg Hx    Breast cancer Neg Hx    Ovarian cancer Neg Hx    Pancreatic cancer Neg Hx    Prostate cancer Neg Hx    Endometrial cancer Neg Hx        Patient unaware    Social Hx  reports that she has been  smoking cigarettes. She has a 40.00 pack-year smoking history. She has never used smokeless tobacco. She reports current alcohol use. She reports that she does not use drugs.  Allergy Allergies  Allergen Reactions   Other Swelling    pneumonia vaccine   Pneumococcal Vac Polyvalent Swelling   Home Meds Prior to Admission medications   Medication Sig Start Date End Date Taking? Authorizing Provider  acetaminophen (TYLENOL) 500 MG tablet Take 500 mg by mouth 3 (three) times daily as needed (pain).    [provider]  gabapentin (NEURONTIN) 100 MG capsule Take 100 mg by mouth 2 (two) times daily. Patient not taking: Reported on 03/22/2022    [provider]  lidocaine-prilocaine (EMLA) cream Apply 1 application topically as needed. Patient taking differently: Apply 1 application  topically as needed (pain). 07/08/21   Orson Slick, MD  Multiple Vitamin (MULTIVITAMIN) tablet Take 1 tablet by mouth daily.    [provider]  Omega-3 Fatty Acids (FISH OIL PO) Take 1 capsule by mouth daily.    [provider]  ondansetron (ZOFRAN) 8 MG tablet Take 1 tablet (8 mg total) by mouth every 8 (eight) hours as needed. Patient taking differently: Take 8 mg by mouth as needed for vomiting or nausea. 07/08/21   Orson Slick, MD  oxyCODONE 10 MG TABS Take 1 tablet (10 mg total) by mouth every 4 (four) hours as needed for up to 7 days for moderate pain. 03/25/22 04/01/22  Caren Griffins, MD  oxyCODONE ER (XTAMPZA ER) 9 MG C12A Take 9 mg by mouth every 12 (twelve) hours as needed. 03/29/22   Dede Query T, PA-C  polyethylene glycol (MIRALAX / GLYCOLAX) 17 g packet Take 17 g by mouth daily.    [provider]  prochlorperazine (COMPAZINE) 10 MG tablet Take 1 tablet (10 mg total) by mouth every 6 (six) hours as needed for nausea or vomiting. Patient not taking: Reported on 02/15/2022 07/08/21   Orson Slick, MD  sulfamethoxazole-trimethoprim (BACTRIM DS)  800-160 MG tablet Take 1 tablet by mouth 2 (two) times daily. 03/30/22   Lincoln Brigham, PA-C  valsartan-hydrochlorothiazide (DIOVAN-HCT) 160-12.5 MG tablet Take 1 tablet by mouth daily. 04/07/21   [provider]  vitamin C (ASCORBIC ACID) 500 MG tablet Take 500 mg by mouth daily.    [provider]    Physical Exam: Vitals:   04/01/22 1649  BP: 121/65  Pulse: 87  Resp: 18  Temp: 98.4 F (36.9 C)  TempSrc: Oral  SpO2: 92%    GENERAL: No acute distress.  Appears well.  HEENT: MMM.  Vision and hearing grossly intact.  NECK: Supple.  No apparent JVD.  RESP:  No IWOB. Good air movement bilaterally. CVS:  RRR. Heart sounds normal.  ABD/GI/GU: Bowel sounds present.  Abdomen full but soft.  Colostomy bag empty.  Mild diffuse tenderness.  No rebound or guarding. MSK/EXT:  Moves extremities. No apparent deformity or edema.  SKIN: no apparent skin  lesion or wound NEURO: Awake, alert and oriented appropriately.  No gross deficit.  PSYCH: Calm. Normal affect.   Personally Reviewed Radiological Exams See HPI   Personally Reviewed Labs: CBC: Recent Labs  Lab 03/29/22 1344 04/01/22 1245  WBC 15.3* 13.9*  NEUTROABS 13.9* 12.5*  HGB 10.3* 9.8*  HCT 30.9* 28.6*  MCV 90.4 88.5  PLT 360 818   Basic Metabolic Panel: Recent Labs  Lab 03/29/22 1344 04/01/22 1245  NA 136 131*  K 4.0 4.2  CL 98 95*  CO2 28 25  GLUCOSE 157* 97  BUN 27* 49*  CREATININE 1.46* 2.26*  CALCIUM 9.4 9.4   GFR: Estimated Creatinine Clearance: 23.9 mL/min (A) (by C-G formula based on SCr of 2.26 mg/dL (H)). Liver Function Tests: Recent Labs  Lab 03/29/22 1344 04/01/22 1245  AST 20 17  ALT 19 19  ALKPHOS 151* 198*  BILITOT 0.4 0.3  PROT 7.4 7.8  ALBUMIN 3.4* 3.5   No results for input(s): "LIPASE", "AMYLASE" in the last 168 hours. No results for input(s): "AMMONIA" in the last 168 hours. Coagulation Profile: No results for input(s): "INR", "PROTIME" in the last 168  hours. Cardiac Enzymes: No results for input(s): "CKTOTAL", "CKMB", "CKMBINDEX", "TROPONINI" in the last 168 hours. BNP (last 3 results) No results for input(s): "PROBNP" in the last 8760 hours. HbA1C: No results for input(s): "HGBA1C" in the last 72 hours. CBG: No results for input(s): "GLUCAP" in the last 168 hours. Lipid Profile: No results for input(s): "CHOL", "HDL", "LDLCALC", "TRIG", "CHOLHDL", "LDLDIRECT" in the last 72 hours. Thyroid Function Tests: No results for input(s): "TSH", "T4TOTAL", "FREET4", "T3FREE", "THYROIDAB" in the last 72 hours. Anemia Panel: No results for input(s): "VITAMINB12", "FOLATE", "FERRITIN", "TIBC", "IRON", "RETICCTPCT" in the last 72 hours. Urine analysis:    Component Value Date/Time   COLORURINE YELLOW 03/29/2022 1522   APPEARANCEUR HAZY (A) 03/29/2022 1522   LABSPEC 1.021 03/29/2022 1522   PHURINE 5.0 03/29/2022 1522   GLUCOSEU NEGATIVE 03/29/2022 1522   HGBUR LARGE (A) 03/29/2022 1522   BILIRUBINUR NEGATIVE 03/29/2022 1522   KETONESUR 5 (A) 03/29/2022 1522   PROTEINUR 100 (A) 03/29/2022 1522   UROBILINOGEN 1.0 02/25/2013 1602   NITRITE NEGATIVE 03/29/2022 1522   LEUKOCYTESUR LARGE (A) 03/29/2022 1522     Assessment and plan: Principal Problem:   AKI (acute kidney injury) ([place]) Active Problems:   Essential hypertension   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 06/22/2021   Colostomy present (HCC)   Hematuria   Hydronephrosis, right   Cancer related pain   Dehydration   Hyponatremia   Pyuria   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 intake, nausea and dry heaving and concurrent use of Diovan HCT.  However, she has history of right hydronephrosis due to pelvic mass for which she had a stent placed on 7/18. Recent Labs    01/19/22 1111 02/15/22 0902 03/20/22 1431 03/21/22 1212 03/22/22 1058  03/23/22 0500 03/24/22 0500 03/25/22 0500 03/29/22 1344 04/01/22 1245  BUN 28* 50* 60* 56* 47* 41* 31* 22 27* 49*  CREATININE 1.25* 1.94* 1.92* 1.97* 1.70* 1.66* 1.10* 1.33* 1.46* 2.26*  -IV NS bolus followed by NS at 125 cc an hour for the next 24 hours. -Renal US -Hold Diovan HCT and avoid nephrotoxins -Consult urology if no improvement  Anemia of chronic disease: Hgb higher than when she was discharged.  I do not see any note  indicating interval blood transfusion.  Probably hemoconcentration from dehydration Recent Labs    01/19/22 1111 02/15/22 0902 03/20/22 1431 03/21/22 1212 03/23/22 0500 03/24/22 0500 03/24/22 1216 03/25/22 1028 03/29/22 1344 04/01/22 1245  HGB 12.3 12.1 9.7* 9.2* 8.4* 7.4* 7.5* 7.0* 10.3* 9.8*  -Continue monitoring  Poorly differentiated high-grade SCC of unclear source.  Recent CT on 7/16 showed 11.1 x 7 cm pelvic mass causing high-grade distal ureteral obstruction.  It seems she received Keytruda infusion at the cancer center prior to coming to the hospital. -Defer to oncology  Cancer related pain back pain: Seems to be on Xtampza ER 9 mg every 12 hours and oxycodone 10 mg every 4 hours as needed -OxyContin 10 mg twice daily and instead of Xtampza -Continue home oxycodone 10 mg every 4 hours as needed moderate pain -IV Dilaudid for severe pain.  Recent pyuria with hematuria: Recent UA with pyuria and hematuria.  Urine culture on 7/25 with insignificant growth.  Patient reports dark urine but no frank blood or blood clot.  She reports hesitancy but no dysuria.  No fever or leukocytosis.  Seems she was started on Bactrim on 7/26.   -Check urinalysis and urine culture -Hold Bactrim and start ceftriaxone given her complex urologic history.  Hyponatremia/dehydration: Likely due to dehydration and iatrogenic from Diovan HCT and Bactrim -IV hydration as above -Hold Diovan HCT and Bactrim.  Essential hypertension: -P.o. hydralazine as  needed  Diverting colostomy/constipation -MiraLAX and Senokot-S -Consult [facility]OC[facility]  Medical reconciliation pending.  Ordered pain medications based on recent discharge summary and oncology note   DVT prophylaxis: SCD due to hematuria  Code Status: Full code Family Communication: Updated patient's husband at bedside  Consults called: None.  Admission status: Observation   Mercy Riding MD [facility]  If 7PM-7AM, please contact night-coverage [email or web address]  04/01/2022, 5:57 PM

## 2022-04-02 ENCOUNTER — Encounter (HOSPITAL_COMMUNITY): Payer: Self-pay | Admitting: Student

## 2022-04-02 DIAGNOSIS — K5669 Other partial intestinal obstruction: Secondary | ICD-10-CM | POA: Diagnosis not present

## 2022-04-02 DIAGNOSIS — K56609 Unspecified intestinal obstruction, unspecified as to partial versus complete obstruction: Secondary | ICD-10-CM | POA: Diagnosis not present

## 2022-04-02 DIAGNOSIS — G893 Neoplasm related pain (acute) (chronic): Secondary | ICD-10-CM | POA: Diagnosis present

## 2022-04-02 DIAGNOSIS — C44529 Squamous cell carcinoma of skin of other part of trunk: Secondary | ICD-10-CM | POA: Diagnosis not present

## 2022-04-02 DIAGNOSIS — D631 Anemia in chronic kidney disease: Secondary | ICD-10-CM | POA: Diagnosis present

## 2022-04-02 DIAGNOSIS — Z4682 Encounter for fitting and adjustment of non-vascular catheter: Secondary | ICD-10-CM | POA: Diagnosis not present

## 2022-04-02 DIAGNOSIS — E860 Dehydration: Secondary | ICD-10-CM | POA: Diagnosis present

## 2022-04-02 DIAGNOSIS — N1831 Chronic kidney disease, stage 3a: Secondary | ICD-10-CM | POA: Diagnosis present

## 2022-04-02 DIAGNOSIS — Z923 Personal history of irradiation: Secondary | ICD-10-CM | POA: Diagnosis not present

## 2022-04-02 DIAGNOSIS — Z8701 Personal history of pneumonia (recurrent): Secondary | ICD-10-CM | POA: Diagnosis not present

## 2022-04-02 DIAGNOSIS — Z933 Colostomy status: Secondary | ICD-10-CM | POA: Diagnosis not present

## 2022-04-02 DIAGNOSIS — Z66 Do not resuscitate: Secondary | ICD-10-CM | POA: Diagnosis not present

## 2022-04-02 DIAGNOSIS — K6389 Other specified diseases of intestine: Secondary | ICD-10-CM | POA: Diagnosis not present

## 2022-04-02 DIAGNOSIS — C4492 Squamous cell carcinoma of skin, unspecified: Secondary | ICD-10-CM | POA: Diagnosis not present

## 2022-04-02 DIAGNOSIS — K5939 Other megacolon: Secondary | ICD-10-CM | POA: Diagnosis not present

## 2022-04-02 DIAGNOSIS — R109 Unspecified abdominal pain: Secondary | ICD-10-CM | POA: Diagnosis not present

## 2022-04-02 DIAGNOSIS — D630 Anemia in neoplastic disease: Secondary | ICD-10-CM | POA: Diagnosis present

## 2022-04-02 DIAGNOSIS — N179 Acute kidney failure, unspecified: Secondary | ICD-10-CM | POA: Diagnosis not present

## 2022-04-02 DIAGNOSIS — N136 Pyonephrosis: Secondary | ICD-10-CM | POA: Diagnosis present

## 2022-04-02 DIAGNOSIS — C539 Malignant neoplasm of cervix uteri, unspecified: Secondary | ICD-10-CM | POA: Diagnosis present

## 2022-04-02 DIAGNOSIS — E871 Hypo-osmolality and hyponatremia: Secondary | ICD-10-CM | POA: Diagnosis present

## 2022-04-02 DIAGNOSIS — Z90710 Acquired absence of both cervix and uterus: Secondary | ICD-10-CM | POA: Diagnosis not present

## 2022-04-02 DIAGNOSIS — R140 Abdominal distension (gaseous): Secondary | ICD-10-CM | POA: Diagnosis not present

## 2022-04-02 DIAGNOSIS — E876 Hypokalemia: Secondary | ICD-10-CM | POA: Diagnosis not present

## 2022-04-02 DIAGNOSIS — Z86018 Personal history of other benign neoplasm: Secondary | ICD-10-CM | POA: Diagnosis not present

## 2022-04-02 DIAGNOSIS — I129 Hypertensive chronic kidney disease with stage 1 through stage 4 chronic kidney disease, or unspecified chronic kidney disease: Secondary | ICD-10-CM | POA: Diagnosis present

## 2022-04-02 DIAGNOSIS — N1330 Unspecified hydronephrosis: Secondary | ICD-10-CM | POA: Diagnosis not present

## 2022-04-02 DIAGNOSIS — Z8719 Personal history of other diseases of the digestive system: Secondary | ICD-10-CM | POA: Diagnosis not present

## 2022-04-02 DIAGNOSIS — C763 Malignant neoplasm of pelvis: Secondary | ICD-10-CM | POA: Diagnosis present

## 2022-04-02 DIAGNOSIS — Z9049 Acquired absence of other specified parts of digestive tract: Secondary | ICD-10-CM | POA: Diagnosis not present

## 2022-04-02 DIAGNOSIS — N2889 Other specified disorders of kidney and ureter: Secondary | ICD-10-CM | POA: Diagnosis not present

## 2022-04-02 DIAGNOSIS — R609 Edema, unspecified: Secondary | ICD-10-CM | POA: Diagnosis not present

## 2022-04-02 DIAGNOSIS — R531 Weakness: Secondary | ICD-10-CM | POA: Diagnosis present

## 2022-04-02 DIAGNOSIS — Z515 Encounter for palliative care: Secondary | ICD-10-CM | POA: Diagnosis not present

## 2022-04-02 DIAGNOSIS — F17210 Nicotine dependence, cigarettes, uncomplicated: Secondary | ICD-10-CM | POA: Diagnosis present

## 2022-04-02 DIAGNOSIS — Z7189 Other specified counseling: Secondary | ICD-10-CM | POA: Diagnosis not present

## 2022-04-02 DIAGNOSIS — Z8249 Family history of ischemic heart disease and other diseases of the circulatory system: Secondary | ICD-10-CM | POA: Diagnosis not present

## 2022-04-02 DIAGNOSIS — Z4659 Encounter for fitting and adjustment of other gastrointestinal appliance and device: Secondary | ICD-10-CM | POA: Diagnosis not present

## 2022-04-02 DIAGNOSIS — D62 Acute posthemorrhagic anemia: Secondary | ICD-10-CM | POA: Diagnosis present

## 2022-04-02 LAB — RENAL FUNCTION PANEL
Albumin: 2.4 g/dL — ABNORMAL LOW (ref 3.5–5.0)
Anion gap: 11 (ref 5–15)
BUN: 40 mg/dL — ABNORMAL HIGH (ref 8–23)
CO2: 22 mmol/L (ref 22–32)
Calcium: 8.4 mg/dL — ABNORMAL LOW (ref 8.9–10.3)
Chloride: 102 mmol/L (ref 98–111)
Creatinine, Ser: 1.81 mg/dL — ABNORMAL HIGH (ref 0.44–1.00)
GFR, Estimated: 30 mL/min — ABNORMAL LOW (ref >60)
Glucose, Bld: 102 mg/dL — ABNORMAL HIGH (ref 70–99)
Phosphorus: 3.4 mg/dL (ref 2.5–4.6)
Potassium: 3.8 mmol/L (ref 3.5–5.1)
Sodium: 135 mmol/L (ref 135–145)

## 2022-04-02 LAB — CK: Total CK: 48 U/L (ref 38–234)

## 2022-04-02 LAB — CBC
HCT: 26.3 % — ABNORMAL LOW (ref 36.0–46.0)
Hemoglobin: 8.7 g/dL — ABNORMAL LOW (ref 12.0–15.0)
MCH: 30.5 pg (ref 26.0–34.0)
MCHC: 33.1 g/dL (ref 30.0–36.0)
MCV: 92.3 fL (ref 80.0–100.0)
Platelets: 411 10*3/uL — ABNORMAL HIGH (ref 150–400)
RBC: 2.85 MIL/uL — ABNORMAL LOW (ref 3.87–5.11)
RDW: 15.3 % (ref 11.5–15.5)
WBC: 12.9 10*3/uL — ABNORMAL HIGH (ref 4.0–10.5)
nRBC: 0 % (ref 0.0–0.2)

## 2022-04-02 LAB — URINALYSIS, ROUTINE W REFLEX MICROSCOPIC
Bacteria, UA: NONE SEEN
Bilirubin Urine: NEGATIVE
Glucose, UA: NEGATIVE mg/dL
Ketones, ur: 5 mg/dL — AB
Nitrite: NEGATIVE
Protein, ur: 100 mg/dL — AB
RBC / HPF: >50 RBC/hpf — ABNORMAL HIGH (ref 0–5)
Specific Gravity, Urine: 1.016 (ref 1.005–1.030)
WBC, UA: >50 WBC/hpf — ABNORMAL HIGH (ref 0–5)
pH: 5 (ref 5.0–8.0)

## 2022-04-02 LAB — MAGNESIUM: Magnesium: 2.2 mg/dL (ref 1.7–2.4)

## 2022-04-02 LAB — HIV ANTIBODY (ROUTINE TESTING W REFLEX): HIV Screen 4th Generation wRfx: NONREACTIVE

## 2022-04-02 MED ORDER — METOPROLOL TARTRATE 5 MG/5ML IV SOLN: 5.0000 mg | INTRAVENOUS | Status: DC | PRN

## 2022-04-02 MED ORDER — DM-GUAIFENESIN ER 30-600 MG PO TB12: 1.0000 | ORAL_TABLET | Freq: Two times a day (BID) | ORAL | Status: DC | PRN

## 2022-04-02 MED ORDER — SODIUM CHLORIDE 0.9 % IV SOLN: INTRAVENOUS | Status: AC

## 2022-04-02 MED ORDER — IPRATROPIUM-ALBUTEROL 0.5-2.5 (3) MG/3ML IN SOLN
3.0000 mL | RESPIRATORY_TRACT | Status: DC | PRN
  Administered 2022-04-04: 3 mL via RESPIRATORY_TRACT
  Filled 2022-04-02: qty 3

## 2022-04-02 MED ORDER — SENNOSIDES-DOCUSATE SODIUM 8.6-50 MG PO TABS
1.0000 | ORAL_TABLET | Freq: Every day | ORAL | Status: DC
  Administered 2022-04-02 – 2022-04-03 (×2): 1 via ORAL
  Filled 2022-04-02 (×2): qty 1

## 2022-04-02 MED ORDER — ENSURE ENLIVE PO LIQD
237.0000 mL | Freq: Two times a day (BID) | ORAL | Status: DC
  Administered 2022-04-02 – 2022-04-03 (×3): 237 mL via ORAL

## 2022-04-02 MED ORDER — HYDROMORPHONE HCL 1 MG/ML IJ SOLN
0.5000 mg | INTRAMUSCULAR | Status: DC | PRN
  Administered 2022-04-02: 0.5 mg via INTRAVENOUS

## 2022-04-02 MED ORDER — HYDRALAZINE HCL 20 MG/ML IJ SOLN: 10.0000 mg | INTRAMUSCULAR | Status: DC | PRN

## 2022-04-02 MED ORDER — BUDESONIDE 0.5 MG/2ML IN SUSP
0.5000 mg | Freq: Two times a day (BID) | RESPIRATORY_TRACT | Status: DC
  Administered 2022-04-05 – 2022-04-19 (×19): 0.5 mg via RESPIRATORY_TRACT
  Filled 2022-04-02 (×25): qty 2

## 2022-04-02 MED ORDER — TRAZODONE HCL 50 MG PO TABS
50.0000 mg | ORAL_TABLET | Freq: Every evening | ORAL | Status: DC | PRN
  Administered 2022-04-03: 50 mg via ORAL
  Filled 2022-04-02: qty 1

## 2022-04-02 NOTE — Progress Notes (Signed)
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 Patient is being admitted from cancer center for weakness, dehydration and AKI per Dr. Libby Maw request.  She was started on Bactrim outpatient 3 days prior to admission with concerns of UTI.   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  Admission creatinine 2.26.  This morning 1.8.  Baseline 1.4. - Continue IV fluids.  Does have right-sided hydronephrosis status post stent placement 7/18 by urology. - Renal ultrasound  Sepsis secondary to UTI recent pyuria with hematuria: POA -Sepsis physiology evidenced by fever, leukocytosis -Recent urine cultures negative.  Hold off on Bactrim.  Empiric IV Rocephin.  IV fluids.  Anemia of chronic disease:  -Currently hemoglobin appears to be stable around 9.0.  Continue to monitor.  Poorly differentiated high-grade SCC of unclear source.  Chronic pain -Follows outpatient oncology, Dr. Lorenso Courier.  Does have a pelvic mass with distal ureteral obstruction status post stent  placement. -Pain control.  Bowel regimen.  We will consult palliative care management to help with pain control   Hyponatremia/dehydration:  -IV fluids   Essential hypertension: -IV as needed ordered   Diverting colostomy/constipation -Bowel regimen.  Wound care team consulted.   Medical reconciliation pending.  Ordered pain medications based on recent discharge summary and oncology note   DVT prophylaxis: SCDs Start: 04/01/22 1753 Code Status: Full code Family Communication: Husband at bedside  Maintain hospital stay for better pain control.  Creatinine still elevated, continue IV fluids.  She has very poor oral intake.    Subjective: Still having lower abdominal pain.  No nausea vomiting.  Very poor oral intake.    Examination:  General exam: Appears calm and comfortable, dry mouth. Respiratory system: Clear to auscultation. Respiratory effort normal. Cardiovascular system: S1 & S2 heard, RRR. No JVD, murmurs, rubs, gallops or clicks. No pedal edema. Gastrointestinal system: Abdomen is nondistended, soft and nontender. No organomegaly or masses felt. Normal bowel sounds heard. Central nervous system: Alert and oriented. No focal neurological deficits. Extremities: Symmetric 5 x 5 power. Skin: No rashes, lesions or ulcers Psychiatry: Judgement and insight appear normal. Mood & affect appropriate.     Objective: Vitals:   04/01/22 1649 04/01/22 2108 04/02/22 0055 04/02/22 0455  BP: 121/65 (!) 113/58 (!) 105/59 118/69  Pulse: 87 87 78 80  Resp: '18 16 14 16  '$ Temp: 98.4 F (36.9 C) (!) 100.5 F (38.1 C) 98.1 F (36.7 C) 98.6 F (37 C)  TempSrc: Oral Oral Oral Oral  SpO2: 92% 97% 93% 98%    Intake/Output Summary (Last 24 hours) at 04/02/2022 0845 Last data filed at 04/02/2022 0830 Gross  per 24 hour  Intake 323.46 ml  Output 850 ml  Net -526.54 ml   There were no vitals filed for this visit.   Data Reviewed:   CBC: Recent Labs  Lab 03/29/22 1344  04/01/22 1245 04/02/22 0309  WBC 15.3* 13.9* 12.9*  NEUTROABS 13.9* 12.5*  --   HGB 10.3* 9.8* 8.7*  HCT 30.9* 28.6* 26.3*  MCV 90.4 88.5 92.3  PLT 360 398 700*   Basic Metabolic Panel: Recent Labs  Lab 03/29/22 1344 04/01/22 1245 04/02/22 0309  NA 136 131* 135  K 4.0 4.2 3.8  CL 98 95* 102  CO2 '28 25 22  '$ GLUCOSE 157* 97 102*  BUN 27* 49* 40*  CREATININE 1.46* 2.26* 1.81*  CALCIUM 9.4 9.4 8.4*  MG  --   --  2.2  PHOS  --   --  3.4   GFR: Estimated Creatinine Clearance: 29.9 mL/min (A) (by C-G formula based on SCr of 1.81 mg/dL (H)). Liver Function Tests: Recent Labs  Lab 03/29/22 1344 04/01/22 1245 04/02/22 0309  AST 20 17  --   ALT 19 19  --   ALKPHOS 151* 198*  --   BILITOT 0.4 0.3  --   PROT 7.4 7.8  --   ALBUMIN 3.4* 3.5 2.4*   No results for input(s): "LIPASE", "AMYLASE" in the last 168 hours. No results for input(s): "AMMONIA" in the last 168 hours. Coagulation Profile: No results for input(s): "INR", "PROTIME" in the last 168 hours. Cardiac Enzymes: Recent Labs  Lab 04/02/22 0309  CKTOTAL 48   BNP (last 3 results) No results for input(s): "PROBNP" in the last 8760 hours. HbA1C: No results for input(s): "HGBA1C" in the last 72 hours. CBG: No results for input(s): "GLUCAP" in the last 168 hours. Lipid Profile: No results for input(s): "CHOL", "HDL", "LDLCALC", "TRIG", "CHOLHDL", "LDLDIRECT" in the last 72 hours. Thyroid Function Tests: No results for input(s): "TSH", "T4TOTAL", "FREET4", "T3FREE", "THYROIDAB" in the last 72 hours. Anemia Panel: No results for input(s): "VITAMINB12", "FOLATE", "FERRITIN", "TIBC", "IRON", "RETICCTPCT" in the last 72 hours. Sepsis Labs: No results for input(s): "PROCALCITON", "LATICACIDVEN" in the last 168 hours.  Recent Results (from the past 240 hour(s))  Culture, Urine     Status: Abnormal   Collection Time: 03/29/22  3:22 PM   Specimen: Urine, Clean Catch  Result Value Ref Range Status   Specimen  Description   Final    URINE, CLEAN CATCH Performed at [facility], [address] [place], [place] [address]    Special Requests   Final    NONE Performed at [facility], [place] [address], [place], [place] [address]    Culture (A)  Final    <10,000 COLONIES/mL INSIGNIFICANT GROWTH Performed at [place] [facility] [address] [place], [place] [address]    Report Status 03/30/2022 FINAL  Final         Radiology Studies: US RENAL  Result Date: 04/01/2022 CLINICAL DATA:  Acute kidney injury.  Ureteral stent. EXAM: RENAL / URINARY TRACT ULTRASOUND COMPLETE COMPARISON:  CT abdomen and pelvis 03/20/2022 FINDINGS: Right Kidney: Renal measurements: 10.4 x 4.6 x 5.2 cm = volume: 130 mL. Echogenicity is increased. There is moderate hydronephrosis. Right ureteral stent is partially visualized. Left Kidney: Renal measurements: 9.7 x 4.4 x 3.6 cm = volume: 82 mL. Echogenicity within normal limits. No mass or hydronephrosis visualized. Bladder: Under distended and not well evaluated. Other: None. IMPRESSION: 1. Moderate right-sided hydronephrosis  persists as seen on recent CT. 2.     Right ureteral stent in place. Electronically Signed   By: Ronney Asters M.D.   On: 04/01/2022 19:37        Scheduled Meds: Continuous Infusions:  sodium chloride 125 mL/hr at 04/02/22 0714   cefTRIAXone (ROCEPHIN)  IV 200 mL/hr at 04/01/22 2200     LOS: 1 day   Time spent= 35 mins    Tewana Bohlen Arsenio Loader, MD [facility]  If 7PM-7AM, please contact night-coverage  04/02/2022, 8:45 AM

## 2022-04-02 NOTE — Progress Notes (Signed)
PT Cancellation Note  Patient Details Name: Mariah Schultz MRN: [id number] DOB: 19-Jun-1956   Cancelled Treatment:    Reason Eval/Treat Not Completed: Pain limiting ability to participate. Pt politely declined PT due to not feeling up for it at the moment.  Husband present and stating that someone needed to look in the toilet at what came out of her rectum.  Nursing informed. Will check back as schedule permits.   Galen Manila 04/02/2022, 9:51 AM

## 2022-04-02 NOTE — Progress Notes (Signed)
OT Cancellation Note  Patient Details Name: Mariah Schultz MRN: [id number] DOB: 1955-12-05   Cancelled Treatment:    Reason Eval/Treat Not Completed: OT screened, no needs identified, will sign off  Baker Moronta L Kemaria Dedic 04/02/2022, 4:09 PM

## 2022-04-02 NOTE — Plan of Care (Signed)

## 2022-04-02 NOTE — Evaluation (Signed)
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  Clinical Impression  Pt has been ambulating in room independently.  She was able to ambulate hallway with IV pole.  Her limiting factor is pain.  Pt without skilled PT needs and will d/c.  Encouraged her to ambulate with husband, but education provided on symptoms of dizziness and weakness.         Recommendations for follow up therapy are one component of a multi-disciplinary discharge planning process, led by the attending physician.  Recommendations may be updated based on patient status, additional functional criteria and insurance authorization.  Follow Up Recommendations No PT follow up      Assistance Recommended at Discharge PRN  Patient can return home with the following       Equipment Recommendations None recommended by PT  Recommendations for Other Services       Functional Status Assessment Patient has not had a recent decline in their functional status     Precautions / Restrictions Precautions Precautions: None      Mobility  Bed Mobility Overal bed mobility: Independent                  Transfers Overall transfer level: Modified independent                      Ambulation/Gait Ambulation/Gait assistance: Modified independent (Device/Increase time) Gait Distance (Feet): 230 Feet Assistive device: IV  Pole Gait Pattern/deviations: Step-through pattern Gait velocity: WNL     General Gait Details: Amb with IV pole for support.  Stairs            Wheelchair Mobility    Modified Rankin (Stroke Patients Only)       Balance Overall balance assessment: Modified Independent                                           Pertinent Vitals/Pain Pain Assessment Pain Assessment: 0-10 Pain Score: 3  Pain Location: rectum Pain Descriptors / Indicators: Pressure Pain Intervention(s): Limited activity within patient's tolerance    Home Living Family/patient expects to be discharged to:: Private residence Living Arrangements: Spouse/significant other Available Help at Discharge: Family Type of Home: House Home Access: Stairs to enter Entrance Stairs-Rails: None Entrance Stairs-Number of Steps: 2   Home Layout: One level Home Equipment: Conservation officer, nature (2 wheels);Shower seat;Cane - single point      Prior Function Prior Level of Function : Independent/Modified Independent                     Hand Dominance        Extremity/Trunk Assessment   Upper Extremity Assessment Upper Extremity Assessment: Overall WFL for tasks assessed    Lower Extremity Assessment Lower Extremity Assessment: Overall WFL for tasks assessed;Generalized weakness    Cervical / Trunk Assessment Cervical / Trunk Assessment: Normal  Communication  Communication: No difficulties  Cognition Arousal/Alertness: Awake/alert Behavior During Therapy: WFL for tasks assessed/performed Overall Cognitive Status: Within Functional Limits for tasks assessed                                          General Comments      Exercises     Assessment/Plan    PT Assessment Patient does not need any further PT services  PT Problem List         PT Treatment Interventions      PT Goals (Current goals can be found in the Care Plan section)  Acute Rehab PT  Goals PT Goal Formulation: All assessment and education complete, DC therapy    Frequency       Co-evaluation               AM-PAC PT "6 Clicks" Mobility  Outcome Measure Help needed turning from your back to your side while in a flat bed without using bedrails?: None Help needed moving from lying on your back to sitting on the side of a flat bed without using bedrails?: None Help needed moving to and from a bed to a chair (including a wheelchair)?: None Help needed standing up from a chair using your arms (e.g., wheelchair or bedside chair)?: None Help needed to walk in hospital room?: None Help needed climbing 3-5 steps with a railing? : A Little 6 Click Score: 23    End of Session   Activity Tolerance: Patient tolerated treatment well Patient left: in bed;with call bell/phone within reach;with family/visitor present Nurse Communication: Mobility status PT Visit Diagnosis: Difficulty in walking, not elsewhere classified (R26.2)    Time: 3762-8315 PT Time Calculation (min) (ACUTE ONLY): 20 min   Charges:   PT Evaluation $PT Eval Low Complexity: 1 Low          Mariah Schultz L. Mariah Schultz, PT  04/02/2022   Mariah Schultz 04/02/2022, 4:16 PM

## 2022-04-03 ENCOUNTER — Inpatient Hospital Stay (HOSPITAL_COMMUNITY): Payer: Medicare HMO

## 2022-04-03 DIAGNOSIS — R109 Unspecified abdominal pain: Secondary | ICD-10-CM | POA: Diagnosis not present

## 2022-04-03 DIAGNOSIS — N179 Acute kidney failure, unspecified: Secondary | ICD-10-CM | POA: Diagnosis not present

## 2022-04-03 LAB — BASIC METABOLIC PANEL
Anion gap: 10 (ref 5–15)
BUN: 23 mg/dL (ref 8–23)
CO2: 21 mmol/L — ABNORMAL LOW (ref 22–32)
Calcium: 8.7 mg/dL — ABNORMAL LOW (ref 8.9–10.3)
Chloride: 105 mmol/L (ref 98–111)
Creatinine, Ser: 1.19 mg/dL — ABNORMAL HIGH (ref 0.44–1.00)
GFR, Estimated: 50 mL/min — ABNORMAL LOW (ref >60)
Glucose, Bld: 123 mg/dL — ABNORMAL HIGH (ref 70–99)
Potassium: 3.9 mmol/L (ref 3.5–5.1)
Sodium: 136 mmol/L (ref 135–145)

## 2022-04-03 LAB — CBC
HCT: 27.8 % — ABNORMAL LOW (ref 36.0–46.0)
Hemoglobin: 9.1 g/dL — ABNORMAL LOW (ref 12.0–15.0)
MCH: 30.3 pg (ref 26.0–34.0)
MCHC: 32.7 g/dL (ref 30.0–36.0)
MCV: 92.7 fL (ref 80.0–100.0)
Platelets: 386 10*3/uL (ref 150–400)
RBC: 3 MIL/uL — ABNORMAL LOW (ref 3.87–5.11)
RDW: 15.4 % (ref 11.5–15.5)
WBC: 17 10*3/uL — ABNORMAL HIGH (ref 4.0–10.5)
nRBC: 0 % (ref 0.0–0.2)

## 2022-04-03 LAB — URINE CULTURE: Culture: NO GROWTH

## 2022-04-03 LAB — MAGNESIUM: Magnesium: 2.1 mg/dL (ref 1.7–2.4)

## 2022-04-03 MED ORDER — DOCUSATE SODIUM 100 MG PO CAPS
100.0000 mg | ORAL_CAPSULE | Freq: Two times a day (BID) | ORAL | Status: DC
  Administered 2022-04-03 – 2022-04-04 (×2): 100 mg via ORAL
  Filled 2022-04-03 (×2): qty 1

## 2022-04-03 MED ORDER — OXYCODONE HCL 5 MG PO TABS
15.0000 mg | ORAL_TABLET | ORAL | Status: DC | PRN
  Administered 2022-04-03 – 2022-04-04 (×4): 15 mg via ORAL
  Filled 2022-04-03 (×4): qty 3

## 2022-04-03 MED ORDER — OXYCODONE HCL 5 MG PO TABS
15.0000 mg | ORAL_TABLET | Freq: Four times a day (QID) | ORAL | Status: DC | PRN
  Administered 2022-04-03: 15 mg via ORAL
  Filled 2022-04-03: qty 3

## 2022-04-03 MED ORDER — FENTANYL 25 MCG/HR TD PT72
1.0000 | MEDICATED_PATCH | TRANSDERMAL | Status: DC
  Administered 2022-04-03 – 2022-04-06 (×2): 1 via TRANSDERMAL
  Filled 2022-04-03 (×2): qty 1

## 2022-04-03 MED ORDER — HYDROMORPHONE HCL 1 MG/ML IJ SOLN
1.0000 mg | INTRAMUSCULAR | Status: DC | PRN
  Administered 2022-04-03 – 2022-04-05 (×15): 1 mg via INTRAVENOUS
  Filled 2022-04-03 (×15): qty 1

## 2022-04-03 MED ORDER — CHLORHEXIDINE GLUCONATE CLOTH 2 % EX PADS
6.0000 | MEDICATED_PAD | Freq: Every day | CUTANEOUS | Status: DC
  Administered 2022-04-03 – 2022-04-19 (×16): 6 via TOPICAL

## 2022-04-03 NOTE — Progress Notes (Signed)
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 Patient is being admitted from cancer center for weakness, dehydration and AKI per Dr. Libby Maw request.  She was started on Bactrim outpatient 3 days prior to admission with concerns of UTI.   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  Admission creatinine 2.26.  This morning 1.19.  Baseline 1.2-1.4. - Continue IV fluids.  Does have right-sided hydronephrosis status post stent placement 7/18 by urology. - Renal ultrasound - normal.   Sepsis secondary to UTI recent pyuria with hematuria: POA -Sepsis physiology evidenced by fever, leukocytosis. WBC rising.  -Recent urine cultures negative.  Hold off on Bactrim.  Empiric IV Rocephin.  IV fluids.  Anemia of chronic disease:  -Currently hemoglobin appears to be stable around 9.0.  Continue to monitor.  Poorly differentiated high-grade SCC of unclear source.  Chronic pain -Follows outpatient oncology, Dr. Lorenso Courier.  Does have a pelvic mass with  distal ureteral obstruction status post stent placement. -Palliative care team following for pain control.    Hyponatremia/dehydration:  Resolved.    Essential hypertension: -IV as needed ordered   Diverting colostomy/constipation -Bowel regimen.  Wound care team consulted.   Medical reconciliation pending.  Ordered pain medications based on recent discharge summary and oncology note   DVT prophylaxis: SCDs Start: 04/01/22 1753 Code Status: Full code Family Communication: Husband at bedside  Maintain hospital stay for better pain control. Cr better. WBC trending up. Hopefully home in next 1-2 days     Subjective: Pain is better controlled. No new complaints.  Denies any significant rectal bleeding.    Examination: Constitutional: Not in acute distress Respiratory: Clear to auscultation bilaterally Cardiovascular: Normal sinus rhythm, no rubs Abdomen: Nontender nondistended good bowel sounds Musculoskeletal: No edema noted Skin: No rashes seen Neurologic: CN 2-12 grossly intact.  And nonfocal Psychiatric: Normal judgment and insight. Alert and oriented x 3. Normal mood.    Ostomy in place  Objective: Vitals:   04/02/22 0455 04/02/22 1347 04/02/22 2010 04/03/22 0458  BP: 118/69 139/76 (!) 144/67 136/64  Pulse: 80 81 84 83  Resp: '16 16 18 18  '$ Temp: 98.6 F (37 C) 98.6 F (37 C) 98.9 F (37.2 C) 98.4 F (36.9 C)  TempSrc: Oral Oral Oral Oral  SpO2: 98% 94% 98% 99%    Intake/Output Summary (Last 24 hours) at 04/03/2022 1023 Last data filed at 04/03/2022 0525 Gross per 24 hour  Intake 3531.66 ml  Output 175 ml  Net 3356.66 ml   There were no vitals filed for this visit.   Data Reviewed:   CBC: Recent Labs  Lab  03/29/22 1344 04/01/22 1245 04/02/22 0309 04/03/22 0457  WBC 15.3* 13.9* 12.9* 17.0*  NEUTROABS 13.9* 12.5*  --   --   HGB 10.3* 9.8* 8.7* 9.1*  HCT 30.9* 28.6* 26.3* 27.8*  MCV 90.4 88.5 92.3 92.7  PLT 360 398 411* 220   Basic Metabolic  Panel: Recent Labs  Lab 03/29/22 1344 04/01/22 1245 04/02/22 0309 04/03/22 0457  NA 136 131* 135 136  K 4.0 4.2 3.8 3.9  CL 98 95* 102 105  CO2 '28 25 22 '$ 21*  GLUCOSE 157* 97 102* 123*  BUN 27* 49* 40* 23  CREATININE 1.46* 2.26* 1.81* 1.19*  CALCIUM 9.4 9.4 8.4* 8.7*  MG  --   --  2.2 2.1  PHOS  --   --  3.4  --    GFR: Estimated Creatinine Clearance: 45.4 mL/min (A) (by C-G formula based on SCr of 1.19 mg/dL (H)). Liver Function Tests: Recent Labs  Lab 03/29/22 1344 04/01/22 1245 04/02/22 0309  AST 20 17  --   ALT 19 19  --   ALKPHOS 151* 198*  --   BILITOT 0.4 0.3  --   PROT 7.4 7.8  --   ALBUMIN 3.4* 3.5 2.4*   No results for input(s): "LIPASE", "AMYLASE" in the last 168 hours. No results for input(s): "AMMONIA" in the last 168 hours. Coagulation Profile: No results for input(s): "INR", "PROTIME" in the last 168 hours. Cardiac Enzymes: Recent Labs  Lab 04/02/22 0309  CKTOTAL 48   BNP (last 3 results) No results for input(s): "PROBNP" in the last 8760 hours. HbA1C: No results for input(s): "HGBA1C" in the last 72 hours. CBG: No results for input(s): "GLUCAP" in the last 168 hours. Lipid Profile: No results for input(s): "CHOL", "HDL", "LDLCALC", "TRIG", "CHOLHDL", "LDLDIRECT" in the last 72 hours. Thyroid Function Tests: No results for input(s): "TSH", "T4TOTAL", "FREET4", "T3FREE", "THYROIDAB" in the last 72 hours. Anemia Panel: No results for input(s): "VITAMINB12", "FOLATE", "FERRITIN", "TIBC", "IRON", "RETICCTPCT" in the last 72 hours. Sepsis Labs: No results for input(s): "PROCALCITON", "LATICACIDVEN" in the last 168 hours.  Recent Results (from the past 240 hour(s))  Culture, Urine     Status: Abnormal   Collection Time: 03/29/22  3:22 PM   Specimen: Urine, Clean Catch  Result Value Ref Range Status   Specimen Description   Final    URINE, CLEAN CATCH Performed at [facility], [address] [place], [place]  [address]    Special Requests   Final    NONE Performed at [facility], [place] [address] [place], [place] [address]    Culture (A)  Final    <10,000 COLONIES/mL INSIGNIFICANT GROWTH Performed at [place] [address], [place], [place] [address]    Report Status 03/30/2022 FINAL  Final  Urine Culture     Status: None   Collection Time: 04/02/22  4:50 AM   Specimen: Urine, Clean Catch  Result Value Ref Range Status   Specimen Description   Final    URINE, CLEAN CATCH Performed at [facility], [place] [address] [place], [place] [address]    Special Requests   Final    NONE Performed at [facility], [place] [address], [place], [place] [address]    Culture   Final    NO GROWTH Performed at [facility], [place] [address] [place], [place] [address]    Report Status 04/03/2022 FINAL  Final  Radiology Studies: US RENAL  Result Date: 04/01/2022 CLINICAL DATA:  Acute kidney injury.  Ureteral stent. EXAM: RENAL / URINARY TRACT ULTRASOUND COMPLETE COMPARISON:  CT abdomen and pelvis 03/20/2022 FINDINGS: Right Kidney: Renal measurements: 10.4 x 4.6 x 5.2 cm = volume: 130 mL. Echogenicity is increased. There is moderate hydronephrosis. Right ureteral stent is partially visualized. Left Kidney: Renal measurements: 9.7 x 4.4 x 3.6 cm = volume: 82 mL. Echogenicity within normal limits. No mass or hydronephrosis visualized. Bladder: Under distended and not well evaluated. Other: None. IMPRESSION: 1. Moderate right-sided hydronephrosis persists as seen on recent CT. 2.     Right ureteral stent in place. Electronically Signed   By: Ronney Asters M.D.   On: 04/01/2022 19:37        Scheduled Meds:  budesonide (PULMICORT) nebulizer solution  0.5 mg Nebulization BID   feeding supplement  237 mL Oral BID BM   senna-docusate  1 tablet Oral QHS   Continuous Infusions:  cefTRIAXone (ROCEPHIN)  IV Stopped (04/02/22  2128)     LOS: 2 days   Time spent= 35 mins    Alastor Kneale Arsenio Loader, MD [facility]  If 7PM-7AM, please contact night-coverage  04/03/2022, 10:23 AM

## 2022-04-03 NOTE — Consult Note (Signed)
Consultation Note Date: 04/03/2022   Patient Name: Mariah Schultz  DOB: [birth year]  MRN: [id number]  Age / Sex: 66 y.o., female  PCP: Seward Carol, MD Referring Physician: Damita Lack, MD  Reason for Consultation: Non pain symptom management and Pain control   HPI/Patient Profile: 66 y.o. female admitted on 04/01/2022   Clinical Assessment and Goals of Care: 66 year old lady who lives at home with her husband in [place], [place].  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  She was recently hospitalized for worsening back pain abdominal pain rectal pressure and hematuria.  She had a right ureteral stent placed for obstructing pelvic mass.  She has been requiring blood transfusions for acute blood loss anemia in the setting of hematuria and acute kidney injury.  She has been admitted from the cancer center for weakness dehydration and acute kidney injury to the hospital medicine service.  Palliative medicine team consulted for symptom management. Patient is awake alert oriented, able to ambulate to and from the bathroom.  Husband present at bedside.  Introduced myself and palliative care as follows: Palliative medicine is specialized medical care for people living with serious illness. It focuses on providing relief from the symptoms and stress of a serious illness. The goal is to improve quality of life for both the patient and the family. Goals of care: Broad aims of medical therapy in relation to the patient's values and preferences. Our aim is to provide medical care aimed at enabling patients to achieve the goals that matter most to them, given the circumstances of their particular medical situation  and their constraints.  Palliative care consult request received, chart reviewed, discussed with TRH MD, patient seen and examined and discussed with husband was also present at bedside and provided some of the history as well. Pain and non pain symptom management options discussed.  Occasional history reviewed.  NEXT OF KIN Is at home with husband, has adult children.  DISCUSSION/SUMMARY OF RECOMMENDATIONS   Oncology notes reviewed, patient is supposed to be on oxycodone immediate release as well as Xtampza.  Patient states that she did not start taking her Xtampza because she had admission to the hospital shortly after this was prescribed.  Complains of a variety of symptoms as such as ongoing generalized pain, abdominal fullness and distention, constipation and nausea.  She states that normally she has a bowel movement every other day, has not had a bowel movement for 5 days now. We will start transdermal fentanyl Continue IV Dilaudid as needed for rescue, also on oral opioids to be used on an as-needed basis. Already on bowel regimen, continue to monitor. Continue antiemetic regimen. Q for the consult, PMT to follow along for aggressive symptom management. Code Status/Advance Care Planning: Full code   Symptom Management:     Palliative Prophylaxis:  Frequent Pain Assessment  Additional Recommendations (Limitations, Scope,  Preferences): Full Scope Treatment  Psycho-social/Spiritual:  Desire for further Chaplaincy support:yes Additional Recommendations: Caregiving  Support/Resources  Prognosis:  Unable to determine  Discharge Planning: To Be Determined      Primary Diagnoses: Present on Admission:  AKI (acute kidney injury) ([place])  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 06/22/2021  Hydronephrosis, right  Essential hypertension  Hematuria   I have reviewed the medical record, interviewed the patient and family, and examined the patient. The following  aspects are pertinent.  Past Medical History:  Diagnosis Date   CAP (community acquired pneumonia) 09/10/2013   History of radiation therapy    pelvis 07/19/2021-09/01/2021  Dr Gery Pray   Hypertension    Papilloma of left breast    Papilloma of left breast 02/26/2019   Perforated appendicitis 03/19/2013   Social History   Socioeconomic History   Marital status: Married    Spouse name: Not on file   Number of children: Not on file   Years of education: Not on file   Highest education level: Not on file  Occupational History   Not on file  Tobacco Use   Smoking status: Every Day    Packs/day: 1.00    Years: 40.00    Total pack years: 40.00    Types: Cigarettes   Smokeless tobacco: Never  Vaping Use   Vaping Use: Never used  Substance and Sexual Activity   Alcohol use: Yes    Comment: occ   Drug use: No   Sexual activity: Not Currently    Birth control/protection: Surgical  Other Topics Concern   Not on file  Social History Narrative   Not on file   Social Determinants of Health   Financial Resource Strain: Low Risk  (06/16/2021)   Overall Financial Resource Strain (CARDIA)    Difficulty of Paying Living Expenses: Not very hard  Food Insecurity: No Food Insecurity (06/16/2021)   Hunger Vital Sign    Worried About Running Out of Food in the Last Year: Never true    Ran Out of Food in the Last Year: Never true  Transportation Needs: No Transportation Needs (06/16/2021)   PRAPARE - Hydrologist (Medical): No    Lack of Transportation (Non-Medical): No  Physical Activity: Not on file  Stress: Not on file  Social Connections: Not on file   Family History  Problem Relation Age of Onset   Heart disease Father    Colon cancer Neg Hx    Breast cancer Neg Hx    Ovarian cancer Neg Hx    Pancreatic cancer Neg Hx    Prostate cancer Neg Hx    Endometrial cancer Neg Hx        Patient unaware   Scheduled Meds:  budesonide (PULMICORT)  nebulizer solution  0.5 mg Nebulization BID   feeding supplement  237 mL Oral BID BM   fentaNYL  1 patch Transdermal Q72H   senna-docusate  1 tablet Oral QHS   Continuous Infusions:  cefTRIAXone (ROCEPHIN)  IV Stopped (04/02/22 2128)   PRN Meds:.acetaminophen **OR** acetaminophen, dextromethorphan-guaiFENesin, hydrALAZINE, HYDROmorphone (DILAUDID) injection, ipratropium-albuterol, metoprolol tartrate, ondansetron **OR** ondansetron (ZOFRAN) IV, oxyCODONE, polyethylene glycol, prochlorperazine, senna-docusate, traZODone Medications Prior to Admission:  Prior to Admission medications   Medication Sig Start Date End Date Taking? Authorizing Provider  acetaminophen (TYLENOL) 500 MG tablet Take 500 mg by mouth 3 (three) times daily as needed (pain).   Yes [provider]  lidocaine-prilocaine (EMLA) cream Apply 1 application topically as  needed. Patient taking differently: Apply 1 application  topically as needed (pain). 07/08/21  Yes Orson Slick, MD  Multiple Vitamin (MULTIVITAMIN) tablet Take 1 tablet by mouth daily.   Yes [provider]  Omega-3 Fatty Acids (FISH OIL PO) Take 1 capsule by mouth daily.   Yes [provider]  ondansetron (ZOFRAN) 8 MG tablet Take 1 tablet (8 mg total) by mouth every 8 (eight) hours as needed. Patient taking differently: Take 8 mg by mouth as needed for vomiting or nausea. 07/08/21  Yes Orson Slick, MD  oxyCODONE ER [facility] ER) 9 MG C12A Take 9 mg by mouth every 12 (twelve) hours as needed. 03/29/22  Yes Dede Query T, PA-C  polyethylene glycol (MIRALAX / GLYCOLAX) 17 g packet Take 17 g by mouth daily.   Yes [provider]  sulfamethoxazole-trimethoprim (BACTRIM DS) 800-160 MG tablet Take 1 tablet by mouth 2 (two) times daily. 03/30/22  Yes Dede Query T, PA-C  valsartan-hydrochlorothiazide (DIOVAN-HCT) 160-12.5 MG tablet Take 1 tablet by mouth daily. 04/07/21  Yes [provider]  vitamin C (ASCORBIC ACID)  500 MG tablet Take 500 mg by mouth daily.   Yes [provider]  prochlorperazine (COMPAZINE) 10 MG tablet Take 1 tablet (10 mg total) by mouth every 6 (six) hours as needed for nausea or vomiting. Patient not taking: Reported on 02/15/2022 07/08/21   Orson Slick, MD   Allergies  Allergen Reactions   Other Swelling    pneumonia vaccine   Pneumococcal Vac Polyvalent Swelling   Review of Systems Patient complains of abdominal discomfort and distention, complains of constipation, complains of nausea. Physical Exam Is able to ambulate independently Abdomen is distended Regular work of breathing Has colostomy bag which is empty. S1-S2 Awake alert oriented  Vital Signs: BP 136/64 (BP Location: Left Arm)   Pulse 83   Temp 98.4 F (36.9 C) (Oral)   Resp 18   SpO2 99%  Pain Scale: 0-10 POSS *See Group Information*: 1-Acceptable,Awake and alert Pain Score: 5    SpO2: SpO2: 99 % O2 Device:SpO2: 99 % O2 Flow Rate: .   IO: Intake/output summary:  Intake/Output Summary (Last 24 hours) at 04/03/2022 1416 Last data filed at 04/03/2022 0525 Gross per 24 hour  Intake 3531.66 ml  Output 175 ml  Net 3356.66 ml    LBM: Last BM Date :  (patient had bloody mucus come from rectum) Baseline Weight:   Most recent weight:       Palliative Assessment/Data:   PPS 60%  Time In:  11 Time Out:  12 Time Total:  60  Greater than 50%  of this time was spent counseling and coordinating care related to the above assessment and plan.  Signed by: Loistine Chance, MD   Please contact Palliative Medicine Team phone at [phone] for questions and concerns.  For individual provider: See Shea Evans

## 2022-04-03 NOTE — TOC CM/SW Note (Signed)
  Transition of Care [facility]) Screening Note   Patient Details  Name: Mariah Schultz Date of Birth: July 26, 1956   Transition of Care [facility]) CM/SW Contact:    Ross Ludwig, LCSW Phone Number: 04/03/2022, 4:08 PM    Transition of Care Department [facility]) has reviewed patient and no TOC needs have been identified at this time. We will continue to monitor patient advancement through interdisciplinary progression rounds. If new patient transition needs arise, please place a TOC consult.

## 2022-04-03 NOTE — Consult Note (Addendum)
[place] Nurse ostomy consult note Stoma type/location: LLQ colostomy performed 06/22/21 by Dr. Johney Maine. Seen by my associate D. Barbie Haggis for ostomy patient education during the post operative period. Husband attended sessions. Patient and spouse are independent in care. Nursing to assist with ostomy care and management including pouch change if patient is unable to participate due to current state and if spouse is not available. Stomal assessment/size: Not seen today. Peristomal assessment: Not seen today Treatment options for stomal/peristomal skin: Skin barrier ring Output: Brown stool Ostomy pouching: 1pc.compressible convex pouching system with skin barrier ring Education provided: None today Enrolled patient in [place] program: Yes, previously  Nursing staff is provided with supply ordering information via the Orders. Ostomy Supplies: 1-piece convex ostomy pouch and skin barrier ring. Pouch is Kellie Simmering # [id number]. Skin barrier ring is Kellie Simmering # [id number].  [place] nursing team will not follow, but will remain available to this patient, the nursing and medical teams.  Please re-consult if needed.  Thank you for inviting [place] to participate in this patient's Plan of Care.  Maudie Flakes, MSN, RN, CNS, [place], Serita Grammes, [employer], [employer] phone:  [phone]

## 2022-04-04 ENCOUNTER — Inpatient Hospital Stay (HOSPITAL_COMMUNITY): Payer: Medicare HMO

## 2022-04-04 DIAGNOSIS — C44529 Squamous cell carcinoma of skin of other part of trunk: Secondary | ICD-10-CM | POA: Diagnosis not present

## 2022-04-04 DIAGNOSIS — N2889 Other specified disorders of kidney and ureter: Secondary | ICD-10-CM | POA: Diagnosis not present

## 2022-04-04 DIAGNOSIS — K5669 Other partial intestinal obstruction: Secondary | ICD-10-CM | POA: Diagnosis not present

## 2022-04-04 DIAGNOSIS — K6389 Other specified diseases of intestine: Secondary | ICD-10-CM | POA: Diagnosis not present

## 2022-04-04 DIAGNOSIS — Z4659 Encounter for fitting and adjustment of other gastrointestinal appliance and device: Secondary | ICD-10-CM | POA: Diagnosis not present

## 2022-04-04 DIAGNOSIS — N179 Acute kidney failure, unspecified: Secondary | ICD-10-CM | POA: Diagnosis not present

## 2022-04-04 LAB — CBC
HCT: 29.4 % — ABNORMAL LOW (ref 36.0–46.0)
Hemoglobin: 9.7 g/dL — ABNORMAL LOW (ref 12.0–15.0)
MCH: 30.6 pg (ref 26.0–34.0)
MCHC: 33 g/dL (ref 30.0–36.0)
MCV: 92.7 fL (ref 80.0–100.0)
Platelets: 412 10*3/uL — ABNORMAL HIGH (ref 150–400)
RBC: 3.17 MIL/uL — ABNORMAL LOW (ref 3.87–5.11)
RDW: 15.4 % (ref 11.5–15.5)
WBC: 15.1 10*3/uL — ABNORMAL HIGH (ref 4.0–10.5)
nRBC: 0 % (ref 0.0–0.2)

## 2022-04-04 LAB — BASIC METABOLIC PANEL
Anion gap: 10 (ref 5–15)
BUN: 18 mg/dL (ref 8–23)
CO2: 23 mmol/L (ref 22–32)
Calcium: 8.9 mg/dL (ref 8.9–10.3)
Chloride: 100 mmol/L (ref 98–111)
Creatinine, Ser: 1.08 mg/dL — ABNORMAL HIGH (ref 0.44–1.00)
GFR, Estimated: 56 mL/min — ABNORMAL LOW (ref >60)
Glucose, Bld: 119 mg/dL — ABNORMAL HIGH (ref 70–99)
Potassium: 4 mmol/L (ref 3.5–5.1)
Sodium: 133 mmol/L — ABNORMAL LOW (ref 135–145)

## 2022-04-04 LAB — MAGNESIUM: Magnesium: 2.1 mg/dL (ref 1.7–2.4)

## 2022-04-04 MED ORDER — DEXTROSE-NACL 5-0.45 % IV SOLN
INTRAVENOUS | Status: DC
Start: 2022-04-04 — End: 2022-04-19
  Administered 2022-04-10: 1000 mL via INTRAVENOUS

## 2022-04-04 MED ORDER — LORAZEPAM 2 MG/ML IJ SOLN
0.5000 mg | Freq: Once | INTRAMUSCULAR | Status: AC
  Administered 2022-04-04: 0.5 mg via INTRAVENOUS
  Filled 2022-04-04: qty 1

## 2022-04-04 MED ORDER — DIATRIZOATE MEGLUMINE & SODIUM 66-10 % PO SOLN
90.0000 mL | Freq: Once | ORAL | Status: AC
Start: 2022-04-04 — End: 2022-04-04
  Administered 2022-04-04: 90 mL via NASOGASTRIC
  Filled 2022-04-04: qty 90

## 2022-04-04 MED ORDER — IOHEXOL 9 MG/ML PO SOLN
500.0000 mL | ORAL | Status: AC
  Administered 2022-04-04 (×2): 500 mL via ORAL

## 2022-04-04 MED ORDER — IOHEXOL 9 MG/ML PO SOLN
ORAL | Status: AC
  Filled 2022-04-04: qty 1000

## 2022-04-04 MED ORDER — FENTANYL CITRATE PF 50 MCG/ML IJ SOSY
25.0000 ug | PREFILLED_SYRINGE | INTRAMUSCULAR | Status: DC | PRN
  Administered 2022-04-04 – 2022-04-06 (×10): 25 ug via INTRAVENOUS
  Filled 2022-04-04 (×10): qty 1

## 2022-04-04 MED ORDER — HYDROCODONE-ACETAMINOPHEN 7.5-325 MG PO TABS: 2.0000 | ORAL_TABLET | Freq: Four times a day (QID) | ORAL | Status: DC | PRN

## 2022-04-04 MED ORDER — LIDOCAINE HCL URETHRAL/MUCOSAL 2 % EX GEL
1.0000 | Freq: Once | CUTANEOUS | Status: AC
  Administered 2022-04-04: 1 via TOPICAL
  Filled 2022-04-04: qty 5

## 2022-04-04 NOTE — Progress Notes (Signed)
Daily Progress Note   Patient Name: Mariah Schultz       Date: 04/04/2022 DOB: Sep 09, 1955  Age: 66 y.o. MRN#: [id number] Attending Physician: Damita Lack, MD Primary Care Physician: Seward Carol, MD Admit Date: 04/01/2022  Reason for Consultation/Follow-up: Non pain symptom management and Pain control  Subjective: Patient and her husband are resting in bed.  She has just returned back from CT abdomen.  Results pending.  Length of Stay: 2  Current Medications: Scheduled Meds:   budesonide (PULMICORT) nebulizer solution  0.5 mg Nebulization BID   Chlorhexidine Gluconate Cloth  6 each Topical Daily   docusate sodium  100 mg Oral BID   feeding supplement  237 mL Oral BID BM   fentaNYL  1 patch Transdermal Q72H   iohexol       senna-docusate  1 tablet Oral QHS    Continuous Infusions:  cefTRIAXone (ROCEPHIN)  IV Stopped (04/03/22 2049)    PRN Meds: acetaminophen **OR** acetaminophen, dextromethorphan-guaiFENesin, hydrALAZINE, HYDROmorphone (DILAUDID) injection, iohexol, ipratropium-albuterol, metoprolol tartrate, ondansetron **OR** ondansetron (ZOFRAN) IV, oxyCODONE, polyethylene glycol, prochlorperazine, senna-docusate, traZODone  Physical Exam         Resting in bed Abdominal distention Regular work of breathing Vital Signs: BP 138/74 (BP Location: Left Arm)   Pulse 81   Temp 98.9 F (37.2 C) (Oral)   Resp 18   Ht '5\' 4"'$  (1.626 m)   Wt 76.5 kg   SpO2 95%   BMI 28.96 kg/m  SpO2: SpO2: 95 % O2 Device: O2 Device: Room Air O2 Flow Rate:    Intake/output summary:  Intake/Output Summary (Last 24 hours) at 04/04/2022 1340 Last data filed at 04/04/2022 0420 Gross per 24 hour  Intake 100 ml  Output --  Net 100 ml   LBM: Last BM Date :  (patient had bloody mucus  come from rectum) Baseline Weight: Weight: 76.5 kg Most recent weight: Weight: 76.5 kg       Palliative Assessment/Data:      Patient Active Problem List   Diagnosis Date Noted   Malignant neoplasm of colon ([place]) 04/01/2022   Cancer related pain 04/01/2022   Dehydration 04/01/2022   Hyponatremia 04/01/2022   Pyuria 04/01/2022   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 04/01/2022   Generalized weakness 04/01/2022   AKI (acute kidney injury) ([place])  03/21/2022   History of radiation therapy    Hematuria    Normocytic anemia    Hydronephrosis, right    Port-A-Cath in place 08/09/2021   Hypokalemia 06/25/2021   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 06/22/2021 06/23/2021   Colostomy present ([place]) 06/23/2021   COVID-19 06/23/2021   Mass of pelvis 06/22/2021   Malignant tumor of pelvis ([place]) 06/22/2021   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 Poorly differentiated high-grade SCC of unclear etiology Has diverting colostomy Palliative medicine team consulted for pain and on pain symptom management  Recommendations/Plan: Surgery consultation is to take place pending CT abdomen results.  Full code, full scope for now, monitor hospital course and overall disease trajectory of illness.  PMT to follow-up on 04-05-2022.  Will have to give consideration for octreotide infusion if the patient has evidence of peritoneal carcinomatosis or malignant small bowel obstruction on CT scan, palliative to follow-up tomorrow.  Goals of Care and Additional Recommendations: Limitations on Scope of Treatment: Full Scope Treatment  Code Status:    Code Status Orders  (From admission, onward)           Start     Ordered   04/01/22 1753  Full code  Continuous        04/01/22 1754           Code  Status History     Date Active Date Inactive Code Status Order ID Comments User Context   03/21/2022 1645 03/25/2022 2329 Full Code [id number]  Elmarie Shiley, MD Inpatient   06/22/2021 1137 06/25/2021 2133 Full Code [id number]  Dorothyann Gibbs, NP Inpatient   09/10/2013 2128 09/11/2013 1824 Full Code [id number]  Shanda Howells, MD ED   02/26/2013 0034 02/27/2013 2014 Full Code [id number]  Harl Bowie, MD Inpatient       Prognosis:  Guarded   Discharge Planning: To Be Determined  Care plan was discussed with IDT  Thank you for allowing the Palliative Medicine Team to assist in the care of this patient.  MOD MDM.   Greater than 50%  of this time was spent counseling and coordinating care related to the above assessment and plan.  Loistine Chance, MD  Please contact Palliative Medicine Team phone at [phone] for questions and concerns.

## 2022-04-04 NOTE — Consult Note (Signed)
Consult Note  Mariah Schultz 1956-04-10  [id number].    Requesting MD: Gerlean Ren, MD Chief Complaint/Reason for Consult: Decreased ostomy output, concern for bowel obstruction HPI:  Patient is a 66 year old female who was admitted 7/28 from the cancer center with weakness, dehydration, AKI. 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 Berline Lopes with [facility] and Dr. Johney Maine. She reports no output either gas or stool from loop colostomy in the last 5 days. She still has some rectal discharge that has been bloody. She reports nausea and vomiting today. She had some dry heaves when she initially presented. She has also been having some urinary symptoms and increased back pain in the last several days. She denied fever, chills, chest pain, SOB. She is followed by Dr. Lorenso Courier at the cancer center and was planning on starting Keytruda when she was seen 03/29/22. Palliative care has seen this admission as well and is following.   PMH otherwise significant for CKD stage III, HTN, anemia. Other prior abdominal surgery includes laparoscopic appendectomy and abdominal hysterectomy. She is not on blood thinners at home. She is on a chronic pain regimen for cancer related pain. She lives with her husband and is independent at baseline. She reports smoking 1/2 PPD and occasional alcohol use. Denied illicit drug use.   ROS: Negative other than HPI  Family History  Problem Relation Age of Onset   Heart disease Father    Colon cancer Neg Hx    Breast cancer Neg Hx    Ovarian cancer Neg Hx    Pancreatic cancer Neg Hx    Prostate cancer Neg Hx    Endometrial cancer Neg Hx        Patient unaware    Past Medical History:  Diagnosis Date   CAP (community acquired pneumonia) 09/10/2013   History of radiation therapy    pelvis  07/19/2021-09/01/2021  Dr Gery Pray   Hypertension    Papilloma of left breast    Papilloma of left breast 02/26/2019   Perforated appendicitis 03/19/2013    Past Surgical History:  Procedure Laterality Date   ABDOMINAL HYSTERECTOMY     BREAST EXCISIONAL BIOPSY Left 02/26/2019   B9 Biopsy   BREAST LUMPECTOMY WITH RADIOACTIVE SEED LOCALIZATION Left 02/26/2019   Procedure: LEFT BREAST LUMPECTOMY WITH RADIOACTIVE SEED LOCALIZATION;  Surgeon: Fanny Skates, MD;  Location: [facility];  Service: General;  Laterality: Left;   CYSTOSCOPY W/ URETERAL STENT PLACEMENT Right 03/22/2022   Procedure: CYSTOSCOPY WITH BILATERAL RETROGRADE PYELOGRAM/ RIGHT URETERAL STENT PLACEMENT;  Surgeon: Lucas Mallow, MD;  Location: [facility];  Service: Urology;  Laterality: Right;   IR IMAGING GUIDED PORT INSERTION  07/21/2021   LAPAROSCOPIC APPENDECTOMY N/A 02/25/2013   Procedure: APPENDECTOMY LAPAROSCOPIC;  Surgeon: Harl Bowie, MD;  Location: [facility];  Service: General;  Laterality: N/A;   LAPAROSCOPY N/A 06/22/2021   Procedure: LAPAROSCOPY DIAGNOSTIC, DIVERTING LOOP COLOSTOMY, ANORECTAL EXAM UNDER ANESTHESIA, TRANSRECTAL CORE BIOPSIES.;  Surgeon: Lafonda Mosses, MD;  Location: [facility];  Service: Gynecology;  Laterality: N/A;    Social History:  reports that she has been smoking cigarettes. She has a 40.00 pack-year smoking history. She has never used smokeless tobacco. She reports current alcohol use. She reports that she does not use drugs.  Allergies:  Allergies  Allergen Reactions  Other Swelling    pneumonia vaccine   Pneumococcal Vac Polyvalent Swelling    Medications Prior to Admission  Medication Sig Dispense Refill   acetaminophen (TYLENOL) 500 MG tablet Take 500 mg by mouth 3 (three) times daily as needed (pain).     lidocaine-prilocaine (EMLA) cream Apply 1 application topically as needed. (Patient taking differently: Apply 1 application  topically as needed  (pain).) 30 g 0   Multiple Vitamin (MULTIVITAMIN) tablet Take 1 tablet by mouth daily.     Omega-3 Fatty Acids (FISH OIL PO) Take 1 capsule by mouth daily.     ondansetron (ZOFRAN) 8 MG tablet Take 1 tablet (8 mg total) by mouth every 8 (eight) hours as needed. (Patient taking differently: Take 8 mg by mouth as needed for vomiting or nausea.) 30 tablet 0   [EXPIRED] oxyCODONE 10 MG TABS Take 1 tablet (10 mg total) by mouth every 4 (four) hours as needed for up to 7 days for moderate pain. 42 tablet 0   oxyCODONE ER (XTAMPZA ER) 9 MG C12A Take 9 mg by mouth every 12 (twelve) hours as needed. 60 capsule 0   polyethylene glycol (MIRALAX / GLYCOLAX) 17 g packet Take 17 g by mouth daily.     sulfamethoxazole-trimethoprim (BACTRIM DS) 800-160 MG tablet Take 1 tablet by mouth 2 (two) times daily. 14 tablet 0   valsartan-hydrochlorothiazide (DIOVAN-HCT) 160-12.5 MG tablet Take 1 tablet by mouth daily.     vitamin C (ASCORBIC ACID) 500 MG tablet Take 500 mg by mouth daily.     prochlorperazine (COMPAZINE) 10 MG tablet Take 1 tablet (10 mg total) by mouth every 6 (six) hours as needed for nausea or vomiting. (Patient not taking: Reported on 02/15/2022) 30 tablet 0    Blood pressure 138/74, pulse 81, temperature 98.9 F (37.2 C), temperature source Oral, resp. rate 18, height '5\' 4"'$  (1.626 m), weight 76.5 kg, SpO2 95 %. Physical Exam:  General: pleasant, WD, female who is laying in bed and appears uncomfortable HEENT: head is normocephalic, atraumatic.  Sclera are noninjected. Ears and nose without any masses or lesions.  Mouth is pink and moist Heart: regular, rate, and rhythm.   Lungs: Respiratory effort nonlabored Abd: soft, diffuse mild ttp without peritonitis, distended, BS hypoactive, stoma pink and viable without any output in ostomy bag MS: all 4 extremities are symmetrical with no cyanosis, clubbing, or edema. Skin: warm and dry with no masses, lesions, or rashes Psych: A&Ox3 with an appropriate  affect.   Results for orders placed or performed during the hospital encounter of 04/01/22 (from the past 48 hour(s))  Basic metabolic panel     Status: Abnormal   Collection Time: 04/03/22  4:57 AM  Result Value Ref Range   Sodium 136 135 - 145 mmol/L   Potassium 3.9 3.5 - 5.1 mmol/L   Chloride 105 98 - 111 mmol/L   CO2 21 (L) 22 - 32 mmol/L   Glucose, Bld 123 (H) 70 - 99 mg/dL    Comment: Glucose reference range applies only to samples taken after fasting for at least 8 hours.   BUN 23 8 - 23 mg/dL   Creatinine, Ser 1.19 (H) 0.44 - 1.00 mg/dL   Calcium 8.7 (L) 8.9 - 10.3 mg/dL   GFR, Estimated 50 (L) >60 mL/min    Comment: (NOTE) Calculated using the CKD-EPI Creatinine Equation (2021)    Anion gap 10 5 - 15    Comment: Performed at [facility], [place]  [address] [place], [place] [address]  CBC     Status: Abnormal   Collection Time: 04/03/22  4:57 AM  Result Value Ref Range   WBC 17.0 (H) 4.0 - 10.5 K/uL   RBC 3.00 (L) 3.87 - 5.11 MIL/uL   Hemoglobin 9.1 (L) 12.0 - 15.0 g/dL   HCT 27.8 (L) 36.0 - 46.0 %   MCV 92.7 80.0 - 100.0 fL   MCH 30.3 26.0 - 34.0 pg   MCHC 32.7 30.0 - 36.0 g/dL   RDW 15.4 11.5 - 15.5 %   Platelets 386 150 - 400 K/uL   nRBC 0.0 0.0 - 0.2 %    Comment: Performed at [facility], [place] [address], [place], [place] [address]  Magnesium     Status: None   Collection Time: 04/03/22  4:57 AM  Result Value Ref Range   Magnesium 2.1 1.7 - 2.4 mg/dL    Comment: Performed at [facility], [place] [address], [place], [place] [address]  Basic metabolic panel     Status: Abnormal   Collection Time: 04/04/22  5:07 AM  Result Value Ref Range   Sodium 133 (L) 135 - 145 mmol/L   Potassium 4.0 3.5 - 5.1 mmol/L   Chloride 100 98 - 111 mmol/L   CO2 23 22 - 32 mmol/L   Glucose, Bld 119 (H) 70 - 99 mg/dL    Comment: Glucose reference range applies only to samples taken after fasting for at least 8 hours.   BUN 18 8  - 23 mg/dL   Creatinine, Ser 1.08 (H) 0.44 - 1.00 mg/dL   Calcium 8.9 8.9 - 10.3 mg/dL   GFR, Estimated 56 (L) >60 mL/min    Comment: (NOTE) Calculated using the CKD-EPI Creatinine Equation (2021)    Anion gap 10 5 - 15    Comment: Performed at [facility], [place] [address] [place], [place] [address]  CBC     Status: Abnormal   Collection Time: 04/04/22  5:07 AM  Result Value Ref Range   WBC 15.1 (H) 4.0 - 10.5 K/uL   RBC 3.17 (L) 3.87 - 5.11 MIL/uL   Hemoglobin 9.7 (L) 12.0 - 15.0 g/dL   HCT 29.4 (L) 36.0 - 46.0 %   MCV 92.7 80.0 - 100.0 fL   MCH 30.6 26.0 - 34.0 pg   MCHC 33.0 30.0 - 36.0 g/dL   RDW 15.4 11.5 - 15.5 %   Platelets 412 (H) 150 - 400 K/uL   nRBC 0.0 0.0 - 0.2 %    Comment: Performed at [facility], [place] [address], [place], [place] [address]  Magnesium     Status: None   Collection Time: 04/04/22  5:07 AM  Result Value Ref Range   Magnesium 2.1 1.7 - 2.4 mg/dL    Comment: Performed at [facility], [place] [address] [place], [place] [address]   DG Abd 1 View  Result Date: 04/03/2022 CLINICAL DATA:  Abdominal pain. EXAM: ABDOMEN - 1 VIEW COMPARISON:  CT renal stone 03/20/2022 FINDINGS: Right ureteral stent is in place. No suspicious calcifications are identified. There are mildly dilated air-filled small bowel loops measuring up to 3.6 cm in the left abdomen, new from prior. Osseous structures are unremarkable. IMPRESSION: 1. New mildly dilated air-filled small bowel loops in the left abdomen may represent ileus/enteritis or partial small bowel obstruction. Correlate clinically. 2.  Right ureteral stent in place. Electronically Signed   By: Tina Griffiths.D.  On: 04/03/2022 19:08      Assessment/Plan Squamous Cell Carcinoma of pelvis of unclear etiology SBO - CT today with SBO with transition in pelvis along anterior margin of pelvic mass - recommend NGT placement for decompression and SBO protocol  - if  patient able to clear SBO protocol may be able to get through admission without any procedures/surgery from a GI standpoint. If unable to clear I think it would be helpful to have ONC weigh in on what her oncologic treatment options are moving forward prior to considering interventions from a bowel standpoint. Appreciate palliative following as well.  - mobilize as tolerated - keep K >4.0 and Mg >2.0 to optimize bowel function  - would not recommend acute surgical intervention at this time but we will continue to follow   FEN: NPO, not currently on IVF will discuss with TRH, NGT to LIWS VTE: ok to have SQH or LMWH from a surgical standpoint ID: [email or web address] for UTI  - below per TRH -  CKD stage III UTI HTN Anemia of chronic disease   I reviewed Consultant palliative notes, hospitalist notes, last 24 h vitals and pain scores, last 48 h intake and output, last 24 h labs and trends, and last 24 h imaging results.   Norm Parcel, [facility] 04/04/2022, 1:10 PM Please see Amion for pager number during day hours 7:00am-4:30pm

## 2022-04-04 NOTE — Progress Notes (Signed)
NG tube placed.  MD notified.  Xray obtained, confirmed placement.  NG tube to low/intermittent suction.

## 2022-04-04 NOTE — Plan of Care (Signed)

## 2022-04-04 NOTE — Progress Notes (Signed)
Attempted to place NG tube per MD order.  Failed attempt in right nare due to deviated septum.  Placed in left nare and RN needed to advance 1 inch and pt motioned to pull the tube out.  Pt and family wanting to know additional options.  MD notified.

## 2022-04-04 NOTE — Progress Notes (Signed)
PROGRESS NOTE    Mariah Schultz  FWY:[id number] DOB: 1955/11/12 DOA: 04/01/2022 PCP: Seward Carol, MD   Brief Narrative:  66 year old F with PMH of poorly differentiated high-grade squamous cell carcinoma of unclear etiology, pelvic mass with partial colon obstruction status post diverting colostomy, and CKD-3A, HTN, anemia and recent hospitalization from 7/17-7/21 for worsening back pain, abdominal pain, rectal pressure and hematuria when she had right ureteral stent for obstructing pelvic mass, blood transfusion for acute blood loss anemia in the setting of hematuria, and AKI. Patient is being admitted from cancer center for weakness, dehydration and AKI per Dr. Libby Maw request.  She was started on Bactrim outpatient 3 days prior to admission with concerns of UTI.  Hospital course complicated by worsening abdominal distention concerns for obstruction.   Assessment & Plan:  Principal Problem:   AKI (acute kidney injury) ([place]) Active Problems:   Essential hypertension   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 06/22/2021   Colostomy present ([place])   Hematuria   Hydronephrosis, right   Cancer related pain   Dehydration   Hyponatremia   Pyuria   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   Generalized weakness     Sepsis secondary to UTI recent pyuria with hematuria: POA -Sepsis physiology evidenced by fever, leukocytosis.  Hold off on Bactrim.  Cultures remain negative, WBC stable. Empiric IV Rocephin  Abdominal distention - Concerns for obstruction versus ileus.  NPO.  CT abdomen pelvis with p.o. contrast done this morning, results are pending.  General surgery consulted.  Anemia of chronic disease:  -Currently hemoglobin appears to be stable around 9.0.  Continue to monitor.  Poorly differentiated high-grade SCC of unclear source.  Chronic pain -Follows outpatient oncology, Dr. Lorenso Courier.  Does have a pelvic mass with distal ureteral  obstruction status post stent placement. -Palliative care team following for pain control.   AKI on CKD-3A/azotemia:  Resolved  -Suspect from dehydration.  Admission creatinine 2.26.  This morning 1.19.  Baseline 1.2-1.4. - Continue IV fluids.  Does have right-sided hydronephrosis status post stent placement 7/18 by urology. - Renal ultrasound - normal.    Hyponatremia/dehydration:  improved   Essential hypertension: -IV as needed ordered   Diverting colostomy/constipation -Bowel regimen.  Wound care team consulted.     DVT prophylaxis: SCDs Start: 04/01/22 1753 Code Status: Full code Family Communication: Husband at bedside  Ongoing evaluation for abdominal distention.    Subjective: Seen and examined at bedside this morning, she is having difficulty passing gas.  Abdomen is distended with feeling of nausea.  Denies any fevers and chills.   Examination: Constitutional: Not in acute distress but generally ill-appearing Respiratory: Clear to auscultation bilaterally Cardiovascular: Normal sinus rhythm, no rubs Abdomen: Diminished bowel sounds, abdomen is distended.  No signs of peritonitis Musculoskeletal: No edema noted Skin: No rashes seen Neurologic: CN 2-12 grossly intact.  And nonfocal Psychiatric: Normal judgment and insight. Alert and oriented x 3. Normal mood.  Ostomy in place  Objective: Vitals:   04/03/22 1610 04/03/22 2024 04/03/22 2041 04/04/22 0434  BP: 129/68  132/83 138/74  Pulse: 78  91 81  Resp: '16  18 18  '$ Temp: 98.4 F (36.9 C)  98.8 F (37.1 C) 98.9 F (37.2 C)  TempSrc: Oral  Oral Oral  SpO2: 92%  97% 95%  Weight:  76.5 kg    Height:  '5\' 4"'$  (1.626 m)      Intake/Output Summary (Last 24 hours) at 04/04/2022 1215 Last data filed at 04/04/2022  0867 Gross per 24 hour  Intake 100 ml  Output --  Net 100 ml   Filed Weights   04/03/22 2024  Weight: 76.5 kg     Data Reviewed:   CBC: Recent Labs  Lab 03/29/22 1344 04/01/22 1245  04/02/22 0309 04/03/22 0457 04/04/22 0507  WBC 15.3* 13.9* 12.9* 17.0* 15.1*  NEUTROABS 13.9* 12.5*  --   --   --   HGB 10.3* 9.8* 8.7* 9.1* 9.7*  HCT 30.9* 28.6* 26.3* 27.8* 29.4*  MCV 90.4 88.5 92.3 92.7 92.7  PLT 360 398 411* 386 619*   Basic Metabolic Panel: Recent Labs  Lab 03/29/22 1344 04/01/22 1245 04/02/22 0309 04/03/22 0457 04/04/22 0507  NA 136 131* 135 136 133*  K 4.0 4.2 3.8 3.9 4.0  CL 98 95* 102 105 100  CO2 '28 25 22 '$ 21* 23  GLUCOSE 157* 97 102* 123* 119*  BUN 27* 49* 40* 23 18  CREATININE 1.46* 2.26* 1.81* 1.19* 1.08*  CALCIUM 9.4 9.4 8.4* 8.7* 8.9  MG  --   --  2.2 2.1 2.1  PHOS  --   --  3.4  --   --    GFR: Estimated Creatinine Clearance: 50.6 mL/min (A) (by C-G formula based on SCr of 1.08 mg/dL (H)). Liver Function Tests: Recent Labs  Lab 03/29/22 1344 04/01/22 1245 04/02/22 0309  AST 20 17  --   ALT 19 19  --   ALKPHOS 151* 198*  --   BILITOT 0.4 0.3  --   PROT 7.4 7.8  --   ALBUMIN 3.4* 3.5 2.4*   No results for input(s): "LIPASE", "AMYLASE" in the last 168 hours. No results for input(s): "AMMONIA" in the last 168 hours. Coagulation Profile: No results for input(s): "INR", "PROTIME" in the last 168 hours. Cardiac Enzymes: Recent Labs  Lab 04/02/22 0309  CKTOTAL 48   BNP (last 3 results) No results for input(s): "PROBNP" in the last 8760 hours. HbA1C: No results for input(s): "HGBA1C" in the last 72 hours. CBG: No results for input(s): "GLUCAP" in the last 168 hours. Lipid Profile: No results for input(s): "CHOL", "HDL", "LDLCALC", "TRIG", "CHOLHDL", "LDLDIRECT" in the last 72 hours. Thyroid Function Tests: No results for input(s): "TSH", "T4TOTAL", "FREET4", "T3FREE", "THYROIDAB" in the last 72 hours. Anemia Panel: No results for input(s): "VITAMINB12", "FOLATE", "FERRITIN", "TIBC", "IRON", "RETICCTPCT" in the last 72 hours. Sepsis Labs: No results for input(s): "PROCALCITON", "LATICACIDVEN" in the last 168 hours.  Recent  Results (from the past 240 hour(s))  Culture, Urine     Status: Abnormal   Collection Time: 03/29/22  3:22 PM   Specimen: Urine, Clean Catch  Result Value Ref Range Status   Specimen Description   Final    URINE, CLEAN CATCH Performed at [facility], [address], [place], [place] [address]    Special Requests   Final    NONE Performed at [facility], [place] [address], [place], [place] [address]    Culture (A)  Final    <10,000 COLONIES/mL INSIGNIFICANT GROWTH Performed at [place] [address], [place],  [address]    Report Status 03/30/2022 FINAL  Final  Urine Culture     Status: None   Collection Time: 04/02/22  4:50 AM   Specimen: Urine, Clean Catch  Result Value Ref Range Status   Specimen Description   Final    URINE, CLEAN CATCH Performed at [facility], [place] [address] [place], [place] [address]    Special Requests   Final    NONE Performed at [facility], [place] [address] [place], [place] [address]    Culture   Final    NO GROWTH Performed at [facility], [place] [address], [place], [place] [address]    Report Status 04/03/2022 FINAL  Final         Radiology Studies: DG Abd 1 View  Result Date: 04/03/2022 CLINICAL DATA:  Abdominal pain. EXAM: ABDOMEN - 1 VIEW COMPARISON:  CT renal stone 03/20/2022 FINDINGS: Right ureteral stent is in place. No suspicious calcifications are identified. There are mildly dilated air-filled small bowel loops measuring up to 3.6 cm in the left abdomen, new from prior. Osseous structures are unremarkable. IMPRESSION: 1. New mildly dilated air-filled small bowel loops in the left abdomen may represent ileus/enteritis or partial small bowel obstruction. Correlate clinically. 2.  Right ureteral stent in place. Electronically Signed   By: Ronney Asters M.D.   On: 04/03/2022 19:08        Scheduled Meds:  budesonide  (PULMICORT) nebulizer solution  0.5 mg Nebulization BID   Chlorhexidine Gluconate Cloth  6 each Topical Daily   docusate sodium  100 mg Oral BID   feeding supplement  237 mL Oral BID BM   fentaNYL  1 patch Transdermal Q72H   iohexol       senna-docusate  1 tablet Oral QHS   Continuous Infusions:  cefTRIAXone (ROCEPHIN)  IV Stopped (04/03/22 2049)     LOS: 2 days   Time spent= 35 mins    T[place]a [place] Arsenio Loader, MD [facility]  If 7PM-7AM, please contact night-coverage  04/04/2022, 12:15 PM

## 2022-04-05 ENCOUNTER — Inpatient Hospital Stay (HOSPITAL_COMMUNITY): Payer: Medicare HMO

## 2022-04-05 ENCOUNTER — Other Ambulatory Visit: Payer: Self-pay

## 2022-04-05 DIAGNOSIS — K5669 Other partial intestinal obstruction: Secondary | ICD-10-CM | POA: Diagnosis not present

## 2022-04-05 DIAGNOSIS — N179 Acute kidney failure, unspecified: Secondary | ICD-10-CM | POA: Diagnosis not present

## 2022-04-05 DIAGNOSIS — K5939 Other megacolon: Secondary | ICD-10-CM | POA: Diagnosis not present

## 2022-04-05 DIAGNOSIS — C44529 Squamous cell carcinoma of skin of other part of trunk: Secondary | ICD-10-CM | POA: Diagnosis not present

## 2022-04-05 DIAGNOSIS — K6389 Other specified diseases of intestine: Secondary | ICD-10-CM | POA: Diagnosis not present

## 2022-04-05 LAB — BASIC METABOLIC PANEL
Anion gap: 8 (ref 5–15)
BUN: 14 mg/dL (ref 8–23)
CO2: 27 mmol/L (ref 22–32)
Calcium: 8.3 mg/dL — ABNORMAL LOW (ref 8.9–10.3)
Chloride: 98 mmol/L (ref 98–111)
Creatinine, Ser: 1.05 mg/dL — ABNORMAL HIGH (ref 0.44–1.00)
GFR, Estimated: 58 mL/min — ABNORMAL LOW (ref >60)
Glucose, Bld: 137 mg/dL — ABNORMAL HIGH (ref 70–99)
Potassium: 3.4 mmol/L — ABNORMAL LOW (ref 3.5–5.1)
Sodium: 133 mmol/L — ABNORMAL LOW (ref 135–145)

## 2022-04-05 LAB — CBC
HCT: 25.9 % — ABNORMAL LOW (ref 36.0–46.0)
Hemoglobin: 8.6 g/dL — ABNORMAL LOW (ref 12.0–15.0)
MCH: 30 pg (ref 26.0–34.0)
MCHC: 33.2 g/dL (ref 30.0–36.0)
MCV: 90.2 fL (ref 80.0–100.0)
Platelets: 361 10*3/uL (ref 150–400)
RBC: 2.87 MIL/uL — ABNORMAL LOW (ref 3.87–5.11)
RDW: 14.9 % (ref 11.5–15.5)
WBC: 13.2 10*3/uL — ABNORMAL HIGH (ref 4.0–10.5)
nRBC: 0 % (ref 0.0–0.2)

## 2022-04-05 LAB — MAGNESIUM: Magnesium: 1.8 mg/dL (ref 1.7–2.4)

## 2022-04-05 MED ORDER — FENTANYL CITRATE PF 50 MCG/ML IJ SOSY
25.0000 ug | PREFILLED_SYRINGE | Freq: Once | INTRAMUSCULAR | Status: AC
  Administered 2022-04-05: 25 ug via INTRAVENOUS
  Filled 2022-04-05: qty 1

## 2022-04-05 MED ORDER — HEPARIN SODIUM (PORCINE) 5000 UNIT/ML IJ SOLN
5000.0000 [IU] | Freq: Three times a day (TID) | INTRAMUSCULAR | Status: DC
  Administered 2022-04-05 – 2022-04-19 (×40): 5000 [IU] via SUBCUTANEOUS
  Filled 2022-04-05 (×40): qty 1

## 2022-04-05 MED ORDER — POTASSIUM CHLORIDE 10 MEQ/100ML IV SOLN
10.0000 meq | INTRAVENOUS | Status: AC
  Administered 2022-04-05 (×4): 10 meq via INTRAVENOUS
  Filled 2022-04-05 (×3): qty 100

## 2022-04-05 MED ORDER — FUROSEMIDE 10 MG/ML IJ SOLN
20.0000 mg | Freq: Once | INTRAMUSCULAR | Status: AC
  Administered 2022-04-05: 20 mg via INTRAVENOUS
  Filled 2022-04-05: qty 2

## 2022-04-05 NOTE — Progress Notes (Signed)
Pt c/o pressure in bladder and  unable to urinate - provider notified. Bladder scan volume showed >250. Provider placed foley order. Indwelling foley placed per protocol. Pt tolerated well. Plan of care continues

## 2022-04-05 NOTE — Progress Notes (Signed)
Hematology/Oncology Progress Note  Clinical Summary: 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  Interval History: --Patient noted to have a small bowel obstruction on CT scan performed on 04/04/2022.  Thought to be secondary to pelvic mass. --NG tube in place, surgery consulted.  Recommends medical management for the time being --Patient continues to struggle with pain control.  Has fentanyl patch and fentanyl injection as needed as well as Dilaudid as needed. --Patient has not produced much in the way of ostomy output. --Kidney function improving steadily with hydration, down to 0.95.  No evidence of stent obstruction.  O:  Vitals:   04/05/22 2247 04/06/22 0644  BP: 135/82 (!) 111/52  Pulse: 88 87  Resp: 18 20  Temp: 98.9 F (37.2 C) (!) 100.5 F (38.1 C)  SpO2: 90% 97%      Latest Ref Rng & Units 04/06/2022    5:17 AM 04/05/2022    6:15 AM 04/04/2022    5:07 AM  CMP  Glucose 70 - 99 mg/dL 154  137  119   BUN 8 - 23 mg/dL '14  14  18   '$ Creatinine 0.44 - 1.00 mg/dL 0.95  1.05  1.08   Sodium 135 - 145 mmol/L 134  133  133   Potassium 3.5 - 5.1 mmol/L 3.3  3.4  4.0   Chloride 98 - 111 mmol/L 97  98  100   CO2 22 - 32 mmol/L '29  27  23   '$ Calcium 8.9 - 10.3 mg/dL 7.9  8.3  8.9       Latest Ref Rng & Units 04/06/2022    5:17 AM 04/05/2022    6:15 AM 04/04/2022    5:07 AM  CBC  WBC 4.0 - 10.5 K/uL 16.8  13.2  15.1   Hemoglobin 12.0 - 15.0 g/dL 8.3  8.6  9.7   Hematocrit 36.0 - 46.0 % 24.9  25.9  29.4   Platelets 150 - 400 K/uL 376  361  412       GENERAL: Chronically ill-appearing elderly African-American female, in NAD  SKIN: skin color, texture, turgor are normal, no rashes or significant lesions EYES: conjunctiva are pink and non-injected, sclera clear LUNGS: clear to auscultation and percussion with normal  breathing effort HEART: regular rate & rhythm and no murmurs and no lower extremity edema Musculoskeletal: no cyanosis of digits and no clubbing  PSYCH: alert & oriented x 3, fluent speech NEURO: no focal motor/sensory deficits  Assessment/Plan:  # Poorly Differentiated High Grade Carcinoma-squamous cell carcinoma of unclear etiology-consistent with Cervical cancer #Hydronephrosis 2/2 to Pelvic Mass #SBO 2/2 to Pelvic Mass -- Patient is status post 1 treatment of pembrolizumab immunotherapy administered on Friday, 04/01/2022.  Next treatment will be due on 04/22/2022. --No evidence of obstructive stent, creatinine improving with hydration --Small bowel obstruction secondary to pelvic mass, surgery currently following and evaluating. --Patient has only received 1 treatment of immunotherapy.  Would recommend all necessary surgical interventions to improve patient small bowel obstruction as the immunotherapy has the potential to provide quality of life and extend life --Agree with pain control per primary team with fentanyl patches 25 mcg every 72 hours with as needed fentanyl 25 mcg every 4 hours as needed and Dilaudid 1 mg every 3 hours as needed. --Oncology service will continue to follow.   Mariah Peoples, MD  Department of Hematology/Oncology [place] [facility] Phone: [phone] Pager: [phone] Email: [email or web address]

## 2022-04-05 NOTE — Progress Notes (Signed)
Pt has expiratory wheezes and reported new cough. Respiratory came to bedside and duoneb administered. Nasal cannula placed to maintain >90% while asleep. Provider notified and x1 '20mg'$  dose of IV lasix ordered and administered. Plan of care continues.

## 2022-04-05 NOTE — Care Management Important Message (Signed)
Important Message  Patient Details IM Letter given to the Patient. Name: Mariah Schultz MRN: [id number] Date of Birth: 1955-11-25   Medicare Important Message Given:  Yes     Kerin Salen 04/05/2022, 9:48 AM

## 2022-04-05 NOTE — Plan of Care (Signed)
Pt c/o pain 9/10.  PRNs administered per MAR.  No ostomy output.  Problem: Nutrition: Goal: Adequate nutrition will be maintained Outcome: Not Progressing   Problem: Elimination: Goal: Will not experience complications related to bowel motility Outcome: Not Progressing   Problem: Pain Managment: Goal: General experience of comfort will improve Outcome: Not Progressing

## 2022-04-05 NOTE — Progress Notes (Signed)
Progress Note: General Surgery Service   Chief Complaint/Subjective: Massive decompression via NG tube.  May feel a little better today compared to yesterday.  Objective: Vital signs in last 24 hours: Temp:  [98.3 F (36.8 C)-98.9 F (37.2 C)] 98.9 F (37.2 C) (08/01 0627) Pulse Rate:  [79-92] 79 (08/01 0627) Resp:  [16-21] 16 (08/01 0627) BP: (117-143)/(60-87) 117/60 (08/01 0627) SpO2:  [92 %-100 %] 100 % (08/01 0627) Last BM Date : 04/04/22  Intake/Output from previous day: 07/31 0701 - 08/01 0700 In: 1546 [I.V.:1446; IV Piggyback:100.1] Out: 2900 [Urine:350; Emesis/NG output:2550] Intake/Output this shift: No intake/output data recorded.  Constitutional: NAD; conversant; no deformities Eyes: Moist conjunctiva; no lid lag; anicteric; PERRL Neck: Trachea midline; no thyromegaly Lungs: Normal respiratory effort; no tactile fremitus CV: RRR; no palpable thrills; no pitting edema GI: Abd Soft, mild tenderness; ostomy without output MSK: Normal range of motion of extremities; no clubbing/cyanosis Psychiatric: Appropriate affect; alert and oriented x3 Lymphatic: No palpable cervical or axillary lymphadenopathy  Lab Results: CBC  Recent Labs    04/04/22 0507 04/05/22 0615  WBC 15.1* 13.2*  HGB 9.7* 8.6*  HCT 29.4* 25.9*  PLT 412* 361   BMET Recent Labs    04/04/22 0507 04/05/22 0615  NA 133* 133*  K 4.0 3.4*  CL 100 98  CO2 23 27  GLUCOSE 119* 137*  BUN 18 14  CREATININE 1.08* 1.05*  CALCIUM 8.9 8.3*   PT/INR No results for input(s): "LABPROT", "INR" in the last 72 hours. ABG No results for input(s): "PHART", "HCO3" in the last 72 hours.  Invalid input(s): "PCO2", "PO2"  Anti-infectives: Anti-infectives (From admission, onward)    Start     Dose/Rate Route Frequency Ordered Stop   04/01/22 1930  cefTRIAXone (ROCEPHIN) 1 g in sodium chloride 0.9 % 100 mL IVPB        1 g 200 mL/hr over 30 Minutes Intravenous Every 24 hours 04/01/22 1830 04/06/22 1914        Medications: Scheduled Meds:  budesonide (PULMICORT) nebulizer solution  0.5 mg Nebulization BID   Chlorhexidine Gluconate Cloth  6 each Topical Daily   fentaNYL  1 patch Transdermal Q72H   heparin injection (subcutaneous)  5,000 Units Subcutaneous Q8H   Continuous Infusions:  cefTRIAXone (ROCEPHIN)  IV Stopped (04/04/22 1832)   dextrose 5 % and 0.45% NaCl 100 mL/hr at 04/05/22 0616   PRN Meds:.acetaminophen **OR** acetaminophen, dextromethorphan-guaiFENesin, fentaNYL (SUBLIMAZE) injection, hydrALAZINE, HYDROcodone-acetaminophen, HYDROmorphone (DILAUDID) injection, ipratropium-albuterol, metoprolol tartrate, ondansetron **OR** ondansetron (ZOFRAN) IV, prochlorperazine, traZODone  Assessment/Plan: 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 If unable to clear I think it would be helpful to have ONC weigh in on what her oncologic treatment options are moving forward prior to considering interventions from a bowel standpoint. Appreciate palliative following as well.  - mobilize as tolerated - keep K >4.0 and Mg >2.0 to optimize bowel function  - would not recommend acute surgical intervention at this time but we will continue to follow    - below per TRH -  CKD stage III UTI HTN Anemia of chronic disease     I reviewed Consultant palliative  notes, hospitalist notes, last 24 h vitals and pain scores, last 48 h intake and output, last 24 h labs and trends, and last 24 h imaging results.      LOS: 3 days   FEN: NPO, IVF resuscitation  ID: rocephin for UTI VTE: ok to have SQH or LMWH from a surgical standpoint, SCDs Foley:  None Dispo: Continued care on floor    Felicie Morn, MD  [facility] Use [email or web address] to contact on call provider  Daily Billing: [phone] - Straightforward / Low MDM

## 2022-04-05 NOTE — Progress Notes (Signed)
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 Patient is being admitted from cancer center for weakness, dehydration and AKI per Dr. Libby Maw request.  She was started on Bactrim outpatient 3 days prior to admission with concerns of UTI.  Hospital course complicated by worsening abdominal distention concerns for obstruction.   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  Hold off on Bactrim.  Cultures remain negative.  WBC has improved.  Complete 5-day IV Rocephin.  Abdominal distention with small bowel obstruction - CT is suggestive of small bowel obstruction.  Concerned secondary to pelvic mass.  NG tube is in place to low intermittent suction.  SBO protocol, AXR = SBO. I wonder if we will be able to resolve this with conservative measures alone. Patient and husband aware.  Gen Sx following.  Oncology to see the patient today.   Anemia of chronic disease:  -Currently hemoglobin appears to be  stable around 9.0.  Continue to monitor.  Poorly differentiated high-grade SCC of unclear source.  Chronic pain - Pelvic mass has caused ureteral obstruction status post stent in place.  Dr. Lorenso Courier to see the patient as the masses possibly causing bowel obstruction. -Palliative care team following for pain control.   AKI on CKD-3A/azotemia:  Resolved  -Suspect from dehydration.  Admission creatinine 2.26.  This morning 1.19.  Baseline 1.2-1.4. - Continue IV fluids.  Does have right-sided hydronephrosis status post stent placement 7/18 by urology. - Renal ultrasound - normal.    Hyponatremia/dehydration:  improved   Essential hypertension: -IV as needed ordered   Diverting colostomy/constipation -Bowel regimen.  Wound care team consulted.   Overall Poor Prognosis. Palliative care team following.    DVT prophylaxis: SQ heparin. Code Status: Full code Family Communication: Husband at bedside  Ongoing evaluation for abdominal distention.    Subjective: Still has abd distention. Maybe little better. Denies any nausea vomiting at this time.    Examination: Constitutional: Not in acute distress Respiratory: Clear to auscultation bilaterally Cardiovascular: Normal sinus rhythm, no rubs Abdomen: + distended abd.  Musculoskeletal: No edema noted Skin: No rashes seen Neurologic: CN 2-12 grossly intact.  And nonfocal Psychiatric: Normal judgment and insight. Alert and oriented x 3. Normal mood.   Ostomy in place NGT in place.  Objective: Vitals:   04/04/22 2240 04/04/22 2344 04/05/22 0229 04/05/22 0627  BP: (!) 142/78  (!) 143/85 117/60  Pulse: 83  84 79  Resp: 16  (!) 21 16  Temp: 98.7 F (37.1 C)  98.5 F (36.9 C) 98.9 F (37.2 C)  TempSrc: Oral  Oral Oral  SpO2: 92% 92% 94% 100%  Weight:      Height:        Intake/Output Summary (Last 24 hours) at 04/05/2022 0743 Last data filed at 04/05/2022 4270 Gross per 24 hour  Intake 1546.01 ml  Output 2900 ml  Net  -1353.99 ml   Filed Weights   04/03/22 2024  Weight: 76.5 kg     Data Reviewed:   CBC: Recent Labs  Lab 03/29/22 1344 04/01/22 1245 04/02/22 0309 04/03/22 0457 04/04/22 0507 04/05/22 0615  WBC 15.3* 13.9* 12.9* 17.0* 15.1* 13.2*  NEUTROABS 13.9* 12.5*  --   --   --   --   HGB 10.3* 9.8* 8.7* 9.1* 9.7* 8.6*  HCT 30.9* 28.6* 26.3* 27.8* 29.4* 25.9*  MCV 90.4 88.5 92.3 92.7 92.7 90.2  PLT 360 398 411* 386 412* 623   Basic Metabolic Panel: Recent Labs  Lab 03/29/22 1344 04/01/22 1245 04/02/22 0309 04/03/22 0457 04/04/22 0507  NA 136 131* 135 136 133*  K 4.0 4.2 3.8 3.9 4.0  CL 98 95* 102 105 100  CO2 '28 25 22 '$ 21* 23  GLUCOSE 157* 97 102* 123* 119*  BUN 27* 49* 40* 23 18  CREATININE 1.46* 2.26* 1.81* 1.19* 1.08*  CALCIUM 9.4 9.4 8.4* 8.7* 8.9  MG  --   --  2.2 2.1 2.1  PHOS  --   --  3.4  --   --    GFR: Estimated Creatinine Clearance: 50.6 mL/min (A) (by C-G formula based on SCr of 1.08 mg/dL (H)). Liver Function Tests: Recent Labs  Lab 03/29/22 1344 04/01/22 1245 04/02/22 0309  AST 20 17  --   ALT 19 19  --   ALKPHOS 151* 198*  --   BILITOT 0.4 0.3  --   PROT 7.4 7.8  --   ALBUMIN 3.4* 3.5 2.4*   No results for input(s): "LIPASE", "AMYLASE" in the last 168 hours. No results for input(s): "AMMONIA" in the last 168 hours. Coagulation Profile: No results for input(s): "INR", "PROTIME" in the last 168 hours. Cardiac Enzymes: Recent Labs  Lab 04/02/22 0309  CKTOTAL 48   BNP (last 3 results) No results for input(s): "PROBNP" in the last 8760 hours. HbA1C: No results for input(s): "HGBA1C" in the last 72 hours. CBG: No results for input(s): "GLUCAP" in the last 168 hours. Lipid Profile: No results for input(s): "CHOL", "HDL", "LDLCALC", "TRIG", "CHOLHDL", "LDLDIRECT" in the last 72 hours. Thyroid Function Tests: No results for input(s): "TSH", "T4TOTAL", "FREET4", "T3FREE", "THYROIDAB" in the last 72 hours. Anemia Panel: No results for input(s):  "VITAMINB12", "FOLATE", "FERRITIN", "TIBC", "IRON", "RETICCTPCT" in the last 72 hours. Sepsis Labs: No results for input(s): "PROCALCITON", "LATICACIDVEN" in the last 168 hours.  Recent Results (from the past 240 hour(s))  Culture, Urine     Status: Abnormal   Collection Time: 03/29/22  3:22 PM   Specimen: Urine, Clean Catch  Result Value Ref Range Status   Specimen Description   Final    URINE, CLEAN CATCH Performed at [facility], [address] [place], [place] [address]    Special Requests   Final    NONE Performed at [facility], [place] [address] [place],  [address]    Culture (A)  Final    <10,000 COLONIES/mL INSIGNIFICANT GROWTH Performed at [place] [address], [place],  [address]    Report Status 03/30/2022 FINAL  Final  Urine Culture  Status: None   Collection Time: 04/02/22  4:50 AM   Specimen: Urine, Clean Catch  Result Value Ref Range Status   Specimen Description   Final    URINE, CLEAN CATCH Performed at [facility], [place] [address], [place], [place] [address]    Special Requests   Final    NONE Performed at [facility], [place] [address] [place], [place] [address]    Culture   Final    NO GROWTH Performed at [facility], [place] [address], [place], [place] [address]    Report Status 04/03/2022 FINAL  Final         Radiology Studies: DG Abd Portable 1V-Small Bowel Protocol-Position Verification  Result Date: 04/04/2022 CLINICAL DATA:  Nasogastric placement EXAM: PORTABLE ABDOMEN - 1 VIEW COMPARISON:  CT same day FINDINGS: Nasogastric tube tip enters the stomach. Side hole remains above the diaphragm. IMPRESSION: Nasogastric tube tip enters the stomach. Side hole remains above the diaphragm. Electronically Signed   By: Nelson Chimes M.D.   On: 04/04/2022 17:02   CT ABDOMEN PELVIS WO CONTRAST  Result Date: 04/04/2022 CLINICAL  DATA:  Follow-up suspected bowel obstruction. 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 EXAM: CT ABDOMEN AND PELVIS WITHOUT CONTRAST TECHNIQUE: Multidetector CT imaging of the abdomen and pelvis was performed following the standard protocol without IV contrast. RADIATION DOSE REDUCTION: This exam was performed according to the departmental dose-optimization program which includes automated exposure control, adjustment of the mA and/or kV according to patient size and/or use of iterative reconstruction technique. COMPARISON:  03/20/2022 FINDINGS: Lower chest: Scarring versus platelike atelectasis is identified within both lung bases. No pleural effusion or airspace consolidation. Hepatobiliary: No suspicious liver lesion identified. Gallbladder appears normal. No bile duct dilatation. Pancreas: Unremarkable. No pancreatic ductal dilatation or surrounding inflammatory changes. Spleen: Normal in size without focal abnormality. Adrenals/Urinary Tract: Normal adrenal glands. Left kidney appears normal. Right-sided nephroureteral stent is been placed since the previous exam. Persistent right hydronephrosis is identified, not significantly changed from the previous exam. No left-sided hydronephrosis. The distal end of the right nephroureteral stent is in the expected location of the right UVJ. Bladder is otherwise unremarkable. Stomach/Bowel: There is diffuse distension of the gastric limb. Multiple dilated loops of small bowel with air-fluid levels are identified. The small bowel loops measure up to 3.5 cm. Findings are consistent with small bowel obstruction. The transition point is in the pelvis along the anterior margin of known pelvic mass, image 71/2 and coronal image 83/3. There is a left sided descending colostomy with normal caliber colon up to the ostomy site. The sigmoid colon and rectum appears encased by the pelvic mass. Vascular/Lymphatic: Aortic  atherosclerosis. No abdominal adenopathy. No enlarged pelvic or inguinal lymph nodes. Reproductive: The uterus is not visualized separate from pelvic soft tissue mass. Other: The soft tissue mass within the pelvis is difficult to separate from the adjacent unopacified pelvic bowel loops. This measures approximately 10.6 x 7.7 cm, image 70/2. Not significantly changed from previous exam. No significant free fluid identified. No signs of pneumoperitoneum. Musculoskeletal: No acute or significant osseous findings. IMPRESSION: 1. Examination is positive for small bowel obstruction. Transition point is in the pelvis along the anterior margin of known pelvic mass. 2. No significant change in size of pelvic soft tissue mass which is difficult to separate from the adjacent unopacified pelvic bowel loops. 3. Interval placement of right-sided nephroureteral stent with persistent right hydronephrosis.  4. Aortic Atherosclerosis (ICD10-I70.0). Electronically Signed   By: Kerby Moors M.D.   On: 04/04/2022 11:20   DG Abd 1 View  Result Date: 04/03/2022 CLINICAL DATA:  Abdominal pain. EXAM: ABDOMEN - 1 VIEW COMPARISON:  CT renal stone 03/20/2022 FINDINGS: Right ureteral stent is in place. No suspicious calcifications are identified. There are mildly dilated air-filled small bowel loops measuring up to 3.6 cm in the left abdomen, new from prior. Osseous structures are unremarkable. IMPRESSION: 1. New mildly dilated air-filled small bowel loops in the left abdomen may represent ileus/enteritis or partial small bowel obstruction. Correlate clinically. 2.  Right ureteral stent in place. Electronically Signed   By: Ronney Asters M.D.   On: 04/03/2022 19:08        Scheduled Meds:  budesonide (PULMICORT) nebulizer solution  0.5 mg Nebulization BID   Chlorhexidine Gluconate Cloth  6 each Topical Daily   fentaNYL  1 patch Transdermal Q72H   Continuous Infusions:  cefTRIAXone (ROCEPHIN)  IV Stopped (04/04/22 1832)    dextrose 5 % and 0.45% NaCl 100 mL/hr at 04/05/22 0616     LOS: 3 days   Time spent= 35 mins    Daanish Copes Arsenio Loader, MD [facility]  If 7PM-7AM, please contact night-coverage  04/05/2022, 7:43 AM

## 2022-04-06 DIAGNOSIS — K5669 Other partial intestinal obstruction: Secondary | ICD-10-CM | POA: Diagnosis not present

## 2022-04-06 DIAGNOSIS — C44529 Squamous cell carcinoma of skin of other part of trunk: Secondary | ICD-10-CM | POA: Diagnosis not present

## 2022-04-06 DIAGNOSIS — N179 Acute kidney failure, unspecified: Secondary | ICD-10-CM | POA: Diagnosis not present

## 2022-04-06 LAB — CBC
HCT: 24.9 % — ABNORMAL LOW (ref 36.0–46.0)
Hemoglobin: 8.3 g/dL — ABNORMAL LOW (ref 12.0–15.0)
MCH: 30.2 pg (ref 26.0–34.0)
MCHC: 33.3 g/dL (ref 30.0–36.0)
MCV: 90.5 fL (ref 80.0–100.0)
Platelets: 376 10*3/uL (ref 150–400)
RBC: 2.75 MIL/uL — ABNORMAL LOW (ref 3.87–5.11)
RDW: 15.1 % (ref 11.5–15.5)
WBC: 16.8 10*3/uL — ABNORMAL HIGH (ref 4.0–10.5)
nRBC: 0 % (ref 0.0–0.2)

## 2022-04-06 LAB — BASIC METABOLIC PANEL
Anion gap: 8 (ref 5–15)
BUN: 14 mg/dL (ref 8–23)
CO2: 29 mmol/L (ref 22–32)
Calcium: 7.9 mg/dL — ABNORMAL LOW (ref 8.9–10.3)
Chloride: 97 mmol/L — ABNORMAL LOW (ref 98–111)
Creatinine, Ser: 0.95 mg/dL (ref 0.44–1.00)
GFR, Estimated: >60 mL/min (ref >60)
Glucose, Bld: 154 mg/dL — ABNORMAL HIGH (ref 70–99)
Potassium: 3.3 mmol/L — ABNORMAL LOW (ref 3.5–5.1)
Sodium: 134 mmol/L — ABNORMAL LOW (ref 135–145)

## 2022-04-06 LAB — MAGNESIUM: Magnesium: 1.6 mg/dL — ABNORMAL LOW (ref 1.7–2.4)

## 2022-04-06 MED ORDER — MAGNESIUM SULFATE 4 GM/100ML IV SOLN
4.0000 g | Freq: Once | INTRAVENOUS | Status: AC
  Administered 2022-04-06: 4 g via INTRAVENOUS
  Filled 2022-04-06: qty 100

## 2022-04-06 MED ORDER — POTASSIUM CHLORIDE 10 MEQ/100ML IV SOLN
10.0000 meq | INTRAVENOUS | Status: AC
  Administered 2022-04-06 (×6): 10 meq via INTRAVENOUS
  Filled 2022-04-06 (×4): qty 100

## 2022-04-06 MED ORDER — SODIUM CHLORIDE 0.9% FLUSH: 9.0000 mL | INTRAVENOUS | Status: DC | PRN

## 2022-04-06 MED ORDER — NALOXONE HCL 0.4 MG/ML IJ SOLN: 0.4000 mg | INTRAMUSCULAR | Status: DC | PRN

## 2022-04-06 MED ORDER — DIPHENHYDRAMINE HCL 50 MG/ML IJ SOLN: 12.5000 mg | Freq: Four times a day (QID) | INTRAMUSCULAR | Status: DC | PRN

## 2022-04-06 MED ORDER — SODIUM CHLORIDE 0.9 % IV SOLN
100.0000 ug/h | INTRAVENOUS | Status: DC
  Administered 2022-04-06 – 2022-04-08 (×5): 50 ug/h via INTRAVENOUS
  Administered 2022-04-08 – 2022-04-11 (×9): 75 ug/h via INTRAVENOUS
  Administered 2022-04-11 – 2022-04-19 (×28): 100 ug/h via INTRAVENOUS
  Filled 2022-04-06 (×63): qty 1

## 2022-04-06 MED ORDER — DIAZEPAM 5 MG/ML IJ SOLN
2.5000 mg | Freq: Two times a day (BID) | INTRAMUSCULAR | Status: DC | PRN
  Administered 2022-04-07 – 2022-04-08 (×2): 2.5 mg via INTRAVENOUS
  Filled 2022-04-06 (×2): qty 2

## 2022-04-06 MED ORDER — HYDROMORPHONE HCL 1 MG/ML IJ SOLN
1.0000 mg | INTRAMUSCULAR | Status: DC | PRN
  Administered 2022-04-06 – 2022-04-12 (×13): 1 mg via INTRAVENOUS
  Filled 2022-04-06 (×13): qty 1

## 2022-04-06 MED ORDER — FENTANYL 50 MCG/ML IV PCA SOLN
INTRAVENOUS | Status: DC
  Administered 2022-04-07: 900 ug via INTRAVENOUS
  Administered 2022-04-07: 750 ug via INTRAVENOUS
  Administered 2022-04-07: 500 ug via INTRAVENOUS
  Filled 2022-04-06 (×3): qty 25

## 2022-04-06 MED ORDER — DIPHENHYDRAMINE HCL 12.5 MG/5ML PO ELIX: 12.5000 mg | ORAL_SOLUTION | Freq: Four times a day (QID) | ORAL | Status: DC | PRN

## 2022-04-06 MED ORDER — DEXAMETHASONE SODIUM PHOSPHATE 4 MG/ML IJ SOLN
4.0000 mg | INTRAMUSCULAR | Status: DC
  Administered 2022-04-06: 4 mg via INTRAVENOUS
  Filled 2022-04-06: qty 1

## 2022-04-06 MED ORDER — METOCLOPRAMIDE HCL 5 MG/ML IJ SOLN
5.0000 mg | Freq: Three times a day (TID) | INTRAMUSCULAR | Status: DC
  Administered 2022-04-06 – 2022-04-12 (×17): 5 mg via INTRAVENOUS
  Filled 2022-04-06 (×17): qty 2

## 2022-04-06 MED ORDER — PHENOL 1.4 % MT LIQD: 1.0000 | OROMUCOSAL | Status: DC | PRN

## 2022-04-06 NOTE — Progress Notes (Signed)
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 Patient is being admitted from cancer center for weakness, dehydration and AKI per Dr. Libby Maw request.  She was started on Bactrim outpatient 3 days prior to admission with concerns of UTI.  Hospital course complicated by SBO possibly from pelvic mass.  General surgery is following.   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  Concerned secondary to pelvic mass.  Completed small bowel protocol, NG tube in place to low intermittent suction.  Wonder if this is malignant obstruction General surgery following, appreciate input. We also need to address her nutrition, TPN?  Sepsis secondary to UTI recent pyuria with hematuria: POA -Sepsis physiology evidenced by fever, leukocytosis.  Hold off on Bactrim.  Cultures remain negative.  WBC trending up which could probably be reactive/hemoconcentration.  Completed 5 days of IV Rocephin  Anemia of chronic disease:   -Currently hemoglobin appears to be stable around 9.0.  Continue to monitor.  Poorly differentiated high-grade SCC of unclear source.  Chronic pain - Pelvic mass has caused ureteral obstruction status post stent in place.  Dr. Lorenso Courier to see the patient as the masses possibly causing bowel obstruction. -Palliative care team following for pain control.   AKI on CKD-3A/azotemia:  Resolved  -Suspect from dehydration.  Admission creatinine 2.26.  Creatinine now at baseline of 1.0.  Has right-sided stent placed by urology on 7/18 for obstructions likely secondary to mass.  Renal ultrasound is negative.   Hyponatremia/dehydration:  improved   Essential hypertension: -IV as needed ordered   Diverting colostomy/constipation -Bowel regimen.  Wound care team consulted.   Overall Poor Prognosis. Palliative care team following.    DVT prophylaxis: SQ heparin. Code Status: Full code Family Communication: Husband and daughter at bedside  Ongoing evaluation for abdominal distention.    Subjective: I had extensive discussion with the patient and family at bedside regarding her current condition.  They will need to continue discussing their case with general surgery regarding proceeding with surgical options-aggressive versus venting PEG tube placement. When I entered the room patient appeared comfortable did not have any other complaints but later she started developing abdominal pain as it came in waves and was colicky in nature.  Soon after it was subsiding.  Overall still feels very weak.   Examination: Constitutional: Appears chronically ill.  NG tube is in place Respiratory: Some bibasilar crackles Cardiovascular: Normal sinus rhythm, no rubs Abdomen: Abdomen is nontender but distended.  Overall slightly softer compared to yesterday. Musculoskeletal: No edema noted Skin: No rashes seen Neurologic: CN 2-12 grossly intact.  And nonfocal Psychiatric: Normal judgment and insight. Alert  and oriented x 3. Normal mood.  Ostomy in place NGT in place.  Objective: Vitals:   04/05/22 0814 04/05/22 1351 04/05/22 2247 04/06/22 0644  BP:  125/66 135/82 (!) 111/52  Pulse:  85 88 87  Resp:  '18 18 20  '$ Temp:  99 F (37.2 C) 98.9 F (37.2 C) (!) 100.5 F (38.1 C)  TempSrc:  Oral Oral Oral  SpO2: 97% 96% 90% 97%  Weight:      Height:        Intake/Output Summary (Last 24 hours) at 04/06/2022 0750 Last data filed at 04/06/2022 0554 Gross per 24 hour  Intake 2342.99 ml  Output 2550 ml  Net -207.01 ml   Filed Weights   04/03/22 2024  Weight: 76.5 kg     Data Reviewed:   CBC: Recent Labs  Lab 04/01/22 1245 04/02/22 0309 04/03/22 0457 04/04/22 0507 04/05/22 0615 04/06/22 0517  WBC 13.9* 12.9* 17.0* 15.1* 13.2* 16.8*  NEUTROABS 12.5*  --   --   --   --   --   HGB 9.8* 8.7* 9.1* 9.7* 8.6* 8.3*  HCT 28.6* 26.3* 27.8* 29.4* 25.9* 24.9*  MCV 88.5 92.3 92.7 92.7 90.2 90.5  PLT 398 411* 386 412* 361 915   Basic Metabolic Panel: Recent Labs  Lab 04/02/22 0309 04/03/22 0457 04/04/22 0507 04/05/22 0615 04/06/22 0517  NA 135 136 133* 133* 134*  K 3.8 3.9 4.0 3.4* 3.3*  CL 102 105 100 98 97*  CO2 22 21* '23 27 29  '$ GLUCOSE 102* 123* 119* 137* 154*  BUN 40* '23 18 14 14  '$ CREATININE 1.81* 1.19* 1.08* 1.05* 0.95  CALCIUM 8.4* 8.7* 8.9 8.3* 7.9*  MG 2.2 2.1 2.1 1.8 1.6*  PHOS 3.4  --   --   --   --    GFR: Estimated Creatinine Clearance: 57.5 mL/min (by C-G formula based on SCr of 0.95 mg/dL). Liver Function Tests: Recent Labs  Lab 04/01/22 1245 04/02/22 0309  AST 17  --   ALT 19  --   ALKPHOS 198*  --   BILITOT 0.3  --   PROT 7.8  --   ALBUMIN 3.5 2.4*   No results for input(s): "LIPASE", "AMYLASE" in the last 168 hours. No results for input(s): "AMMONIA" in the last 168 hours. Coagulation Profile: No results for input(s): "INR", "PROTIME" in the last 168 hours. Cardiac Enzymes: Recent Labs  Lab 04/02/22 0309  CKTOTAL 48   BNP (last 3 results) No  results for input(s): "PROBNP" in the last 8760 hours. HbA1C: No results for input(s): "HGBA1C" in the last 72 hours. CBG: No results for input(s): "GLUCAP" in the last 168 hours. Lipid Profile: No results for input(s): "CHOL", "HDL", "LDLCALC", "TRIG", "CHOLHDL", "LDLDIRECT" in the last 72 hours. Thyroid Function Tests: No results for input(s): "TSH", "T4TOTAL", "FREET4", "T3FREE", "THYROIDAB" in the last 72 hours. Anemia Panel: No results for input(s): "VITAMINB12", "FOLATE", "FERRITIN", "TIBC", "IRON", "RETICCTPCT" in the last 72 hours. Sepsis Labs: No results for input(s): "PROCALCITON", "LATICACIDVEN" in the last 168 hours.  Recent Results (from the past 240 hour(s))  Culture, Urine     Status: Abnormal   Collection Time: 03/29/22  3:22 PM   Specimen: Urine, Clean Catch  Result Value Ref Range Status   Specimen Description   Final    URINE, CLEAN CATCH Performed at [facility], [address] [place], [place] [address]    Special Requests  Final    NONE Performed at [facility], [place] [address], [place], [place] [address]    Culture (A)  Final    <10,000 COLONIES/mL INSIGNIFICANT GROWTH Performed at [facility] [address], [place], [place] [address]    Report Status 03/30/2022 FINAL  Final  Urine Culture     Status: None   Collection Time: 04/02/22  4:50 AM   Specimen: Urine, Clean Catch  Result Value Ref Range Status   Specimen Description   Final    URINE, CLEAN CATCH Performed at [facility], [place] [address] [place], [place] [address]    Special Requests   Final    NONE Performed at [facility], [place] [address], [place], [place] [address]    Culture   Final    NO GROWTH Performed at [facility], [place] [address] [place], [place] [address]    Report Status 04/03/2022 FINAL  Final         Radiology Studies: DG Abd Portable 1V-Small Bowel  Obstruction Protocol-initial, 8 hr delay  Result Date: 04/05/2022 CLINICAL DATA:  Small-bowel protocol.  8 hour film. EXAM: PORTABLE ABDOMEN - 1 VIEW COMPARISON:  04/04/2022 FINDINGS: Nasogastric tube tip in the stomach. Nasogastric tube side hole above the diaphragm as seen previously. Double-J ureteral stent on the right. Dilated fluid and air-filled small bowel again demonstrated. Presumably administered oral contrast is poorly demonstrated, probably diffuse within fluid-filled small bowel. Findings are consistent with ongoing small bowel obstruction. IMPRESSION: Administered contrast poorly demonstrated, probably diffused within fluid distended small bowel, consistent with ongoing small bowel obstruction. Electronically Signed   By: Nelson Chimes M.D.   On: 04/05/2022 08:03   DG Abd Portable 1V-Small Bowel Protocol-Position Verification  Result Date: 04/04/2022 CLINICAL DATA:  Nasogastric placement EXAM: PORTABLE ABDOMEN - 1 VIEW COMPARISON:  CT same day FINDINGS: Nasogastric tube tip enters the stomach. Side hole remains above the diaphragm. IMPRESSION: Nasogastric tube tip enters the stomach. Side hole remains above the diaphragm. Electronically Signed   By: Nelson Chimes M.D.   On: 04/04/2022 17:02   CT ABDOMEN PELVIS WO CONTRAST  Result Date: 04/04/2022 CLINICAL DATA:  Follow-up suspected bowel obstruction. 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 EXAM: CT ABDOMEN AND PELVIS WITHOUT CONTRAST TECHNIQUE: Multidetector CT imaging of the abdomen and pelvis was performed following the standard protocol without IV contrast. RADIATION DOSE REDUCTION: This exam was performed according to the departmental dose-optimization program which includes automated exposure control, adjustment of the mA and/or kV according to patient size and/or use of iterative reconstruction technique. COMPARISON:  03/20/2022 FINDINGS: Lower chest: Scarring versus  platelike atelectasis is identified within both lung bases. No pleural effusion or airspace consolidation. Hepatobiliary: No suspicious liver lesion identified. Gallbladder appears normal. No bile duct dilatation. Pancreas: Unremarkable. No pancreatic ductal dilatation or surrounding inflammatory changes. Spleen: Normal in size without focal abnormality. Adrenals/Urinary Tract: Normal adrenal glands. Left kidney appears normal. Right-sided nephroureteral stent is been placed since the previous exam. Persistent right hydronephrosis is identified, not significantly changed from the previous exam. No left-sided hydronephrosis. The distal end of the right nephroureteral stent is in the expected location of the right UVJ. Bladder is otherwise unremarkable. Stomach/Bowel: There is diffuse distension of the gastric limb. Multiple dilated loops of small bowel with air-fluid levels are identified. The small bowel loops measure up to 3.5 cm. Findings are consistent with small bowel obstruction.  The transition point is in the pelvis along the anterior margin of known pelvic mass, image 71/2 and coronal image 83/3. There is a left sided descending colostomy with normal caliber colon up to the ostomy site. The sigmoid colon and rectum appears encased by the pelvic mass. Vascular/Lymphatic: Aortic atherosclerosis. No abdominal adenopathy. No enlarged pelvic or inguinal lymph nodes. Reproductive: The uterus is not visualized separate from pelvic soft tissue mass. Other: The soft tissue mass within the pelvis is difficult to separate from the adjacent unopacified pelvic bowel loops. This measures approximately 10.6 x 7.7 cm, image 70/2. Not significantly changed from previous exam. No significant free fluid identified. No signs of pneumoperitoneum. Musculoskeletal: No acute or significant osseous findings. IMPRESSION: 1. Examination is positive for small bowel obstruction. Transition point is in the pelvis along the anterior margin  of known pelvic mass. 2. No significant change in size of pelvic soft tissue mass which is difficult to separate from the adjacent unopacified pelvic bowel loops. 3. Interval placement of right-sided nephroureteral stent with persistent right hydronephrosis. 4. Aortic Atherosclerosis (ICD10-I70.0). Electronically Signed   By: Kerby Moors M.D.   On: 04/04/2022 11:20        Scheduled Meds:  budesonide (PULMICORT) nebulizer solution  0.5 mg Nebulization BID   Chlorhexidine Gluconate Cloth  6 each Topical Daily   fentaNYL  1 patch Transdermal Q72H   heparin injection (subcutaneous)  5,000 Units Subcutaneous Q8H   Continuous Infusions:  dextrose 5 % and 0.45% NaCl 100 mL/hr at 04/06/22 0554   magnesium sulfate bolus IVPB     potassium chloride       LOS: 4 days   Time spent= 35 mins    Caryl Fate Arsenio Loader, MD [facility]  If 7PM-7AM, please contact night-coverage  04/06/2022, 7:50 AM

## 2022-04-06 NOTE — Progress Notes (Signed)
Progress Note: General Surgery Service   Chief Complaint/Subjective: No better today.  Still with pain in her abdomen.  No output from her colostomy.  NGT with 1700cc of output.  Objective: Vital signs in last 24 hours: Temp:  [98.9 F (37.2 C)-100.5 F (38.1 C)] 100.5 F (38.1 C) (08/02 0644) Pulse Rate:  [85-88] 87 (08/02 0644) Resp:  [18-20] 20 (08/02 0644) BP: (111-135)/(52-82) 111/52 (08/02 0644) SpO2:  [90 %-97 %] 97 % (08/02 0644) Last BM Date :  (no output through ostomy currently d/t SBO)  Intake/Output from previous day: 08/01 0701 - 08/02 0700 In: 2343 [I.V.:1843; IV Piggyback:500] Out: 2550 [Urine:850; Emesis/NG output:1700] Intake/Output this shift: No intake/output data recorded.  PE: Abd: distended, but still softer than she has been, mild tenderness to palpation.  Colostomy with no output.  Stoma is pink and viable.  NGT with bilious output, cannister with minimal output as it was just changed.   Lab Results: CBC  Recent Labs    04/05/22 0615 04/06/22 0517  WBC 13.2* 16.8*  HGB 8.6* 8.3*  HCT 25.9* 24.9*  PLT 361 376   BMET Recent Labs    04/05/22 0615 04/06/22 0517  NA 133* 134*  K 3.4* 3.3*  CL 98 97*  CO2 27 29  GLUCOSE 137* 154*  BUN 14 14  CREATININE 1.05* 0.95  CALCIUM 8.3* 7.9*   PT/INR No results for input(s): "LABPROT", "INR" in the last 72 hours. ABG No results for input(s): "PHART", "HCO3" in the last 72 hours.  Invalid input(s): "PCO2", "PO2"  Anti-infectives: Anti-infectives (From admission, onward)    Start     Dose/Rate Route Frequency Ordered Stop   04/01/22 1930  cefTRIAXone (ROCEPHIN) 1 g in sodium chloride 0.9 % 100 mL IVPB        1 g 200 mL/hr over 30 Minutes Intravenous Every 24 hours 04/01/22 1830 04/05/22 2100       Medications: Scheduled Meds:  budesonide (PULMICORT) nebulizer solution  0.5 mg Nebulization BID   Chlorhexidine Gluconate Cloth  6 each Topical Daily   fentaNYL  1 patch Transdermal Q72H    heparin injection (subcutaneous)  5,000 Units Subcutaneous Q8H   Continuous Infusions:  dextrose 5 % and 0.45% NaCl 100 mL/hr at 04/06/22 0759   magnesium sulfate bolus IVPB 4 g (04/06/22 1010)   potassium chloride 10 mEq (04/06/22 0902)   PRN Meds:.acetaminophen **OR** acetaminophen, dextromethorphan-guaiFENesin, fentaNYL (SUBLIMAZE) injection, hydrALAZINE, HYDROcodone-acetaminophen, HYDROmorphone (DILAUDID) injection, ipratropium-albuterol, metoprolol tartrate, ondansetron **OR** ondansetron (ZOFRAN) IV, prochlorperazine, traZODone  Assessment/Plan: Squamous Cell Carcinoma of pelvis of unclear etiology SBO - CT today with SBO with transition in pelvis along anterior margin of pelvic mass - unfortunately she is not resolving her obstruction yet.  She has no colostomy output and his NGT output is still high. -I had a long discussion today with the patient and her daughter who was in the room regarding possible treatment plans.  I also spoke to Dr. Lorenso Courier to help get an idea of his expectancy regarding her oncologic treatment/prognosis to help guide [place] in decision making regarding surgery.  D/w Dr. Reesa Chew as well -discussion with the patient and family included option 1, do no surgery and place a venting g-tube for palliation and if no resolution go home with hospice.  2. Proceed to OR for surgical intervention which could have any number of outcomes including but not limited to, successful resolution of obstruction and uncomplicated recovery, inability to correct obstruction and need bypass which could have complications such  as anastomotic leak, fistula, etc, inability to do anything secondary to frozen abdomen with attempt to place g-tube if able to access the stomach.  We discussed that surgery could go well and she recovers fairly well vs because of her malignant state, somewhat decrease recent nutrition, etc, she could have post complications that make her quality of life decreased to poor post op.   The daughter and the patient were very grateful for a frank discussion regarding her options as this was the first time that they were hearing some of this information.  They would like some time to discuss all of these options prior to making a decision.  It sounds like she would like to continue fighting at this time and will likely decide on surgery, but we will await further discussions. -cont NGT for now    FEN: NPO, IVFNGT ID: rocephin for UTI completed VTE: heparin, SCDs Foley: None Dispo: Continued care on floor  - below per TRH -  CKD stage III UTI HTN Anemia of chronic disease     I reviewed Consultant palliative notes, hospitalist notes, and spoke to oncology on the phone, last 24 h vitals and pain scores, last 48 h intake and output, last 24 h labs and trends, and last 24 h imaging results.      LOS: 4 days   Henreitta Cea, [facility] Use [email or web address] to contact on call provider

## 2022-04-06 NOTE — Plan of Care (Signed)
Discussed with patient and family plan of care for the evening, pain management and new medications with some teach back displayed.   Explained that I can't take all her pain away but PCA pump, anxiety and anti-inflammatory medications will help manage it.

## 2022-04-06 NOTE — Progress Notes (Signed)
Pt continues to report pain 8-9/10 despite pain medication. Pts pain appears to be very poorly controlled. Provider notified multiple times throughout the night. See orders. Pt continues to c/o intense pressure on rectum. Indwelling foley remains in place. NG tube remains to LIWS. Output from NG tube decreased from yesterday and pt reports a decrease in nausea. Plan of care continues.

## 2022-04-06 NOTE — TOC Progression Note (Signed)
Transition of Care [facility]) - Progression Note    Patient Details  Name: Mariah Schultz MRN: [id number] Date of Birth: 1956-08-02  Transition of Care [facility]) CM/SW Contact  Purcell Mouton, RN Phone Number: 04/06/2022, 2:40 PM  Clinical Narrative:    Pt is ready for discharge. TOC will continue to follow.        Expected Discharge Plan and Services                                                 Social Determinants of Health (SDOH) Interventions    Readmission Risk Interventions    04/03/2022    5:52 PM 04/03/2022    4:06 PM  Readmission Risk Prevention Plan  Transportation Screening Complete Complete  PCP or Specialist Appt within 5-7 Days Complete Complete  Home Care Screening Complete Complete  Medication Review (RN CM) Referral to Pharmacy Referral to Pharmacy

## 2022-04-07 ENCOUNTER — Inpatient Hospital Stay (HOSPITAL_COMMUNITY): Payer: Medicare HMO

## 2022-04-07 DIAGNOSIS — K5669 Other partial intestinal obstruction: Secondary | ICD-10-CM | POA: Diagnosis not present

## 2022-04-07 DIAGNOSIS — N179 Acute kidney failure, unspecified: Secondary | ICD-10-CM | POA: Diagnosis not present

## 2022-04-07 DIAGNOSIS — K6389 Other specified diseases of intestine: Secondary | ICD-10-CM | POA: Diagnosis not present

## 2022-04-07 DIAGNOSIS — Z4682 Encounter for fitting and adjustment of non-vascular catheter: Secondary | ICD-10-CM | POA: Diagnosis not present

## 2022-04-07 DIAGNOSIS — Z933 Colostomy status: Secondary | ICD-10-CM | POA: Diagnosis not present

## 2022-04-07 DIAGNOSIS — C44529 Squamous cell carcinoma of skin of other part of trunk: Secondary | ICD-10-CM | POA: Diagnosis not present

## 2022-04-07 DIAGNOSIS — R140 Abdominal distension (gaseous): Secondary | ICD-10-CM | POA: Diagnosis not present

## 2022-04-07 LAB — MAGNESIUM: Magnesium: 2.6 mg/dL — ABNORMAL HIGH (ref 1.7–2.4)

## 2022-04-07 LAB — BASIC METABOLIC PANEL
Anion gap: 6 (ref 5–15)
BUN: 16 mg/dL (ref 8–23)
CO2: 27 mmol/L (ref 22–32)
Calcium: 7.8 mg/dL — ABNORMAL LOW (ref 8.9–10.3)
Chloride: 99 mmol/L (ref 98–111)
Creatinine, Ser: 1.05 mg/dL — ABNORMAL HIGH (ref 0.44–1.00)
GFR, Estimated: 58 mL/min — ABNORMAL LOW (ref >60)
Glucose, Bld: 186 mg/dL — ABNORMAL HIGH (ref 70–99)
Potassium: 4.8 mmol/L (ref 3.5–5.1)
Sodium: 132 mmol/L — ABNORMAL LOW (ref 135–145)

## 2022-04-07 LAB — CBC
HCT: 23.5 % — ABNORMAL LOW (ref 36.0–46.0)
Hemoglobin: 7.8 g/dL — ABNORMAL LOW (ref 12.0–15.0)
MCH: 30.5 pg (ref 26.0–34.0)
MCHC: 33.2 g/dL (ref 30.0–36.0)
MCV: 91.8 fL (ref 80.0–100.0)
Platelets: 356 10*3/uL (ref 150–400)
RBC: 2.56 MIL/uL — ABNORMAL LOW (ref 3.87–5.11)
RDW: 15 % (ref 11.5–15.5)
WBC: 16.4 10*3/uL — ABNORMAL HIGH (ref 4.0–10.5)
nRBC: 0 % (ref 0.0–0.2)

## 2022-04-07 MED ORDER — FENTANYL 50 MCG/ML IV PCA SOLN
INTRAVENOUS | Status: DC
  Administered 2022-04-07: 943 ug via INTRAVENOUS
  Administered 2022-04-07: 647 ug via INTRAVENOUS
  Administered 2022-04-07: 588.5 ug via INTRAVENOUS
  Administered 2022-04-07: 737.9 ug via INTRAVENOUS
  Administered 2022-04-08: 200 ug via INTRAVENOUS
  Administered 2022-04-08: 750 ug via INTRAVENOUS
  Administered 2022-04-08: 571.1 ug via INTRAVENOUS
  Administered 2022-04-08: 650 ug via INTRAVENOUS
  Administered 2022-04-08: 400 ug via INTRAVENOUS
  Administered 2022-04-09: 431.6 ug via INTRAVENOUS
  Administered 2022-04-09: 800 ug via INTRAVENOUS
  Administered 2022-04-09: 650 ug via INTRAVENOUS
  Administered 2022-04-10 (×2): 25 mL via INTRAVENOUS
  Administered 2022-04-10: 946.7 ug via INTRAVENOUS
  Administered 2022-04-10: 416.4 ug via INTRAVENOUS
  Administered 2022-04-10: 350 ug via INTRAVENOUS
  Administered 2022-04-11: 200.5 ug via INTRAVENOUS
  Administered 2022-04-11: 301.6 ug via INTRAVENOUS
  Administered 2022-04-11: 185.5 ug via INTRAVENOUS
  Administered 2022-04-11: 384 ug via INTRAVENOUS
  Administered 2022-04-11: 520.6 ug via INTRAVENOUS
  Administered 2022-04-11: 309 ug via INTRAVENOUS
  Administered 2022-04-11: 247.4 ug via INTRAVENOUS
  Filled 2022-04-07 (×14): qty 25

## 2022-04-07 MED ORDER — DEXAMETHASONE SODIUM PHOSPHATE 4 MG/ML IJ SOLN
4.0000 mg | Freq: Once | INTRAMUSCULAR | Status: AC
  Administered 2022-04-07: 4 mg via INTRAVENOUS
  Filled 2022-04-07: qty 1

## 2022-04-07 MED ORDER — CEFAZOLIN SODIUM-DEXTROSE 2-4 GM/100ML-% IV SOLN
2.0000 g | INTRAVENOUS | Status: AC
Start: 2022-04-08 — End: 2022-04-08
  Administered 2022-04-08: 2 g via INTRAVENOUS
  Filled 2022-04-07 (×2): qty 100

## 2022-04-07 MED ORDER — DEXAMETHASONE SODIUM PHOSPHATE 4 MG/ML IJ SOLN
4.0000 mg | INTRAMUSCULAR | Status: DC
  Administered 2022-04-08 – 2022-04-11 (×4): 4 mg via INTRAVENOUS
  Filled 2022-04-07 (×4): qty 1

## 2022-04-07 NOTE — Progress Notes (Addendum)
I personally saw the patient and performed a substantive portion of this encounter, including a complete performance of at least one of the key components (MDM, Hx and/or Exam), in conjunction with the Advanced Practice Provider Saverio Danker, PA-C.  Long discussion with family, palliative care team and patient.  Recommend palliative G-tube.  Concerned about risks of reoperation in a radiated field, progression of her tumor causing more problems and putting her through a surgery that does nothing to address her primary complaint which I believe is pain from the tumor itself.  Patient and family agree.     Progress Note: General Surgery Service   Chief Complaint/Subjective: No better today.  Still with pain in her pelvis.  No ostomy output.  NGT output decreased to 200cc  Objective: Vital signs in last 24 hours: Temp:  [97.9 F (36.6 C)-99.3 F (37.4 C)] 98.1 F (36.7 C) (08/03 0438) Pulse Rate:  [78-92] 78 (08/03 0438) Resp:  [16-20] 16 (08/03 0821) BP: (116-130)/(59-78) 130/78 (08/03 0438) SpO2:  [90 %-97 %] 97 % (08/03 0847) FiO2 (%):  [0 %] 0 % (08/03 0327) Last BM Date :  (no output through ostomy currently d/t SBO)  Intake/Output from previous day: 08/02 0701 - 08/03 0700 In: 2139.5 [I.V.:2139.5] Out: 875 [Urine:675; Emesis/NG output:200] Intake/Output this shift: No intake/output data recorded.  PE: Abd: distended, but still softer than she has been, mild tenderness to palpation.  Colostomy with no output.  Stoma is pink, viable, and patent.  Mildly edematous.  NGT with some bilious output, cannister with minimal output.   Lab Results: CBC  Recent Labs    04/06/22 0517 04/07/22 0605  WBC 16.8* 16.4*  HGB 8.3* 7.8*  HCT 24.9* 23.5*  PLT 376 356   BMET Recent Labs    04/06/22 0517 04/07/22 0605  NA 134* 132*  K 3.3* 4.8  CL 97* 99  CO2 29 27  GLUCOSE 154* 186*  BUN 14 16  CREATININE 0.95 1.05*  CALCIUM 7.9* 7.8*   PT/INR No results for input(s):  "LABPROT", "INR" in the last 72 hours. ABG No results for input(s): "PHART", "HCO3" in the last 72 hours.  Invalid input(s): "PCO2", "PO2"  Anti-infectives: Anti-infectives (From admission, onward)    Start     Dose/Rate Route Frequency Ordered Stop   04/01/22 1930  cefTRIAXone (ROCEPHIN) 1 g in sodium chloride 0.9 % 100 mL IVPB        1 g 200 mL/hr over 30 Minutes Intravenous Every 24 hours 04/01/22 1830 04/05/22 2100       Medications: Scheduled Meds:  budesonide (PULMICORT) nebulizer solution  0.5 mg Nebulization BID   Chlorhexidine Gluconate Cloth  6 each Topical Daily   dexamethasone (DECADRON) injection  4 mg Intravenous Once   [START ON 04/08/2022] dexamethasone (DECADRON) injection  4 mg Intravenous Q24H   fentaNYL   Intravenous Q4H   heparin injection (subcutaneous)  5,000 Units Subcutaneous Q8H   metoCLOPramide (REGLAN) injection  5 mg Intravenous Q8H   Continuous Infusions:  dextrose 5 % and 0.45% NaCl 100 mL/hr at 04/07/22 0313   octreotide (SANDOSTATIN) 500 mcg in sodium chloride 0.9 % 250 mL (2 mcg/mL) infusion 50 mcg/hr (04/07/22 0809)   PRN Meds:.acetaminophen **OR** acetaminophen, dextromethorphan-guaiFENesin, diazepam, diphenhydrAMINE **OR** diphenhydrAMINE, hydrALAZINE, HYDROmorphone (DILAUDID) injection, ipratropium-albuterol, metoprolol tartrate, naloxone **AND** sodium chloride flush, ondansetron **OR** ondansetron (ZOFRAN) IV, phenol, prochlorperazine  Assessment/Plan: Squamous Cell Carcinoma of pelvis of unclear etiology SBO - CT today with SBO with transition in pelvis along anterior margin  of pelvic mass - unfortunately she is not resolving her obstruction yet.  She has no colostomy output. -long discussion was had in conjunction with palliative care, primary service, and surgical service today with the family and patient.  Surgical options were discussed with the patient but also risks all of these options etc.  After long discussion surgical  intervention to assess for bypass ability was taken off the table due to high risk.  Ultimately, the decision was for a venting g-tube and await further recommendations until after oncology speaks to the patient and the family. -g-tube placement by IR requested by palliative. -continue to follow.   FEN: NPO, IVF/NGT ID: rocephin for UTI completed VTE: heparin, SCDs Foley: None Dispo: Continued care on floor  - below per TRH -  CKD stage III UTI HTN Anemia of chronic disease     I reviewed Consultant palliative notes, hospitalist notes, last 24 h vitals and pain scores, last 48 h intake and output, last 24 h labs and trends, and last 24 h imaging results.      LOS: 5 days   Henreitta Cea, [facility] Use [email or web address] to contact on call provider

## 2022-04-07 NOTE — Progress Notes (Signed)
Palliative Care Interdisciplinary Family Meeting  Today I met at bedside with Mrs. Parslow, her husband and daughter along with the hospitalist and general surgery team, oncology was not able to attend due to clinic schedule.  Purpose of the Meeting:  To clarify goals of care and discuss her malignant bowel obstruction management-specifically today with surgery team and to then  further plan on conversation with oncology re: prognosis, anticipated outcomes, treatment options including immunotherapy. To outline a plan for pain and symptom management and anticipate as much as possible needs related to being able to discharge home.To discuss concerns around nutrition, fluids, ostomy and tubes and mobility.   Outcomes of Meeting:  After a thorough discussion was had about risks benefits to attempting to surgically intervene with bowel obstruction including resection of bowel and tumor debulking, the risk of complications such as more pain, post-op ileus, inability to close midline incision, high probability of recurrence, risk of not being able to proceed due to cancer progression. Patient and family do not want to proceed with this route. They have elected to have a VENTING Gastrostomy tube -placed for now and continue decompression until a plan can be made regarding her other treatment options specifically immunotherapy. If treatment is planned can consider short term TPN with a definitive timeline for cancer response to treatment. Will continue to work on pain and symptom management.  Awaiting oncology input and recommendations regarding further treatment and management of her cancer.  Family and patient have requested very direct prognostic information.  At this point they are not sure if immunotherapy is an option and if it is what can they expect in terms of side effects and response of cancer. Patient wants pain control to be the first priority, because she considered today what prolonging her life  without pain control could mean for her.  Recommendations:  Increase PCA basal to 43mg/hr, continue q15 bolus of 519m with 200 limit on bolus dosing. Give additional dose of decadron IR for venting gastrostomy Octreotide infusion IV PPI BID  I will discuss code status in more detail once plans are more solid for them in terms of treatment course and anticipated outcomes.  ElLane HackerDO Palliative Medicine   Time: 120 minutes

## 2022-04-07 NOTE — Progress Notes (Signed)
Stoma is enlarged and pretruding out with only mucous output.  Family is concerned will pass on to AM MD.

## 2022-04-07 NOTE — Progress Notes (Signed)
Palliative Care Progress Note  Met with patient and family (husband, daughter and son) re: goals of care and to assist in decision making re: surgical option for malignant obstruction and for symptom management.   Summary of Meeting:  Patient and family understand there is no cure for her cancer, but had no understanding of prognosis and anticipated outcomes with or without interventions ie. Surgery to "bypass obstruction", immunotherapy and venting gastrostomy tube. They expressed frustration about multiple complications occurring seemingly daily and what role cancer progression is playing in this. They are hopeful she can return home with a good QOL with surgery and treatment. They are expecting her to be able to resume her nutrition and take in fluids normally.  I shared with them the dire situation with her cancer and what is most likely a malignant bowel obstruction (MBO).   Studies involving a series of surgical cases of MBO have shown a 30-day mortality of 25% (9%-40%), postsurgical morbidity of 50% (9%-90%), a rate of reobstruction of 48% (39%-57%), and a median survival of 7 months (2-12 months).  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   Cancer related pain and suffering. Her current pain and discomfort levels are extremely high and she is desperate for relief.   I do not believe patient and family understand what can be realistically achieved even with palliative interventions. I discussed hospice options and attempted to provide factual information about the goal of hospice.  Recommendations:  Clarify Prognostic Solivan is likely looking at weeks at best. Family need information on all options and consistent message from all providers. Venting Gastrostomy Tube  Started PCA-Fentanyl will titrate Octreotide for MBO Decadron for MBO Family requesting meeting with all providers present in person or virtually.  Discussion about code status with  other providers present.  Lane Hacker, DO Palliative Medicine   Time:120 minutes

## 2022-04-07 NOTE — Progress Notes (Signed)
Hematology/Oncology Progress Note  Clinical Summary: 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  Interval History: -- Had a detailed discussion with patient, husband, and daughter about steps moving forward.  Patient notes he wants to do everything necessary to continue to live.  That would include venting G-tube and TPN if necessary. --Patient continues to struggle with pain control.  Has fentanyl patch and fentanyl injection as needed as well as Dilaudid as needed.  Palliative care is assisting in the management of this. --Patient has not produced much in the way of ostomy output. --Kidney function improving steady, Cr 1.05.   O:  Vitals:   04/07/22 1439 04/07/22 1657  BP: (!) 149/72   Pulse: 70   Resp: 18 15  Temp: 97.8 F (36.6 C)   SpO2: 97% 97%      Latest Ref Rng & Units 04/07/2022    6:05 AM 04/06/2022    5:17 AM 04/05/2022    6:15 AM  CMP  Glucose 70 - 99 mg/dL 186  154  137   BUN 8 - 23 mg/dL '16  14  14   '$ Creatinine 0.44 - 1.00 mg/dL 1.05  0.95  1.05   Sodium 135 - 145 mmol/L 132  134  133   Potassium 3.5 - 5.1 mmol/L 4.8  3.3  3.4   Chloride 98 - 111 mmol/L 99  97  98   CO2 22 - 32 mmol/L '27  29  27   '$ Calcium 8.9 - 10.3 mg/dL 7.8  7.9  8.3       Latest Ref Rng & Units 04/07/2022    6:05 AM 04/06/2022    5:17 AM 04/05/2022    6:15 AM  CBC  WBC 4.0 - 10.5 K/uL 16.4  16.8  13.2   Hemoglobin 12.0 - 15.0 g/dL 7.8  8.3  8.6   Hematocrit 36.0 - 46.0 % 23.5  24.9  25.9   Platelets 150 - 400 K/uL 356  376  361       GENERAL: Chronically ill-appearing elderly African-American female, in NAD  SKIN: skin color, texture, turgor are normal, no rashes or significant lesions EYES: conjunctiva are pink and non-injected, sclera clear LUNGS: clear to auscultation and percussion with normal breathing effort HEART:  regular rate & rhythm and no murmurs and no lower extremity edema Musculoskeletal: no cyanosis of digits and no clubbing  PSYCH: alert & oriented x 3, fluent speech NEURO: no focal motor/sensory deficits  Assessment/Plan: Mariah Schultz is a 66 year old female with medical history significant for 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  Today there was a multi disciplinary discussion with palliative care, and surgical services.  Oncology was involved but not able to be present in person.  Based on the discussions a venting G-tube has been recommended by the surgical services.  Surgical relief of the obstruction is not recommended at this time.  If venting G-tube is placed patient will need nutrition with TPN and continued strong pain control.  Additionally this is only beneficial if the immunotherapy has a pronounced effect on her tumor.  Today we discussed risks and benefits of strategies moving forward.   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  Next treatment will be due on 04/22/2022. --No evidence of obstructive stent, creatinine improving with hydration --Small bowel obstruction secondary to pelvic mass, surgery currently following and evaluating.  Currently offering a venting G-tube. --Patient has only received 1 treatment of immunotherapy. --Agree with pain control per palliative care team with fentanyl patches 25 mcg every 72 hours with as needed fentanyl 25 mcg every 4 hours as needed and Dilaudid 1 mg every 3 hours as needed.   --Appreciate the assistance of palliative care regarding discussions and supportive treatment moving forward. --Oncology  service will continue to follow.   Ledell Peoples, MD Department of Hematology/Oncology [place] [facility] Phone: [phone] Pager: [phone] Email: [email or web address]

## 2022-04-07 NOTE — Progress Notes (Signed)
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 Patient is being admitted from cancer center for weakness, dehydration and AKI per Dr. Libby Maw request.  She was started on Bactrim outpatient 3 days prior to admission with concerns of UTI.  Hospital course complicated by SBO possibly from pelvic mass.  General surgery is following.   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  Concerned secondary to pelvic mass.  C NG tube to wall suction.  General surgery following.  Ongoing discussion regarding further surgical options-extensive surgery versus only venting G-tube. Overall poor surgical option for extensive surgery. Will plan for G tube for now.  We also need to address her nutrition, TPN? Pain secondary to her malignancy.   Sepsis secondary to UTI recent pyuria with hematuria: POA -Sepsis physiology evidenced by fever, leukocytosis.  Hold off on Bactrim.  Cultures remain  negative.  WBC is trending up.  Completed 5 days of IV Rocephin.  Suspect some reactive changes but if she starts spiking fever we will have to broaden our antibiotics.  Anemia of chronic disease:  -Currently hemoglobin appears to be stable around 9.0.  Drifting down slowly, no obvious evidence of bleeding.  Transfuse if necessary.  Poorly differentiated high-grade SCC of unclear source.  Chronic pain - Pelvic mass has caused ureteral obstruction status post stent in place.  Dr. Lorenso Courier to see the patient as the masses possibly causing bowel obstruction. -Palliative care managing pain control.  AKI on CKD-3A/azotemia:  Resolved  -Suspect from dehydration.  Admission creatinine 2.26.  Creatinine now at baseline of 1.0.  Has right-sided stent placed by urology on 7/18 for obstructions likely secondary to mass.  Renal ultrasound is negative.   Hyponatremia/dehydration:  improved   Essential hypertension: -IV as needed ordered   Diverting colostomy/constipation -Bowel regimen.  Wound care team consulted.   Overall Poor Prognosis. Palliative care team following.  We will try to get clarity on further treatment options as reasonably family is conflicted in their decision.  At this time they have made it clear they would want aggressive treatment if possible.   DVT prophylaxis: SQ heparin. Code Status: Full code Family Communication: Family at bedside   Ongoing evaluation for abdominal distention.    Subjective: Patient seen and examined at bedside.  She had lots of questions regarding her abdominal discomfort and pain.  Her pain comes and goes, NG tube output has reduced. Case was extensively discussed in the room with the patient and family members along with palliative care and general surgery team in the room.  Patient and family has clearly been explained regarding extent of her malignancy and extensive surgical options.  For now best option for her is to proceed with venting G-tube  placement.   Examination: Constitutional: Not in acute distress, chronically ill. Respiratory: Clear to auscultation bilaterally Cardiovascular: Normal sinus rhythm, no rubs Abdomen: Abdomen is slightly distended.  Ostomy in place Musculoskeletal: No edema noted Skin: No rashes seen Neurologic: CN 2-12 grossly intact.  And nonfocal Psychiatric: Normal judgment and insight. Alert and oriented x 3. Normal mood.  Ostomy in place NGT in place.  Objective: Vitals:   04/07/22 0000 04/07/22 0149 04/07/22 0327 04/07/22 0438  BP:    130/78  Pulse:    78  Resp: '19 19 18 20  '$ Temp:    98.1 F (36.7 C)  TempSrc:    Oral  SpO2: 92% 90% 90% 96%  Weight:      Height:        Intake/Output Summary (Last 24 hours) at 04/07/2022 0739 Last data filed at 04/07/2022 0630 Gross per 24 hour  Intake 2139.52 ml  Output 875 ml  Net 1264.52 ml   Filed Weights   04/03/22 2024  Weight: 76.5 kg     Data Reviewed:   CBC: Recent Labs  Lab 04/01/22 1245 04/02/22 0309 04/03/22 0457 04/04/22 0507 04/05/22 0615 04/06/22 0517 04/07/22 0605  WBC 13.9*   < > 17.0* 15.1* 13.2* 16.8* 16.4*  NEUTROABS 12.5*  --   --   --   --   --   --   HGB 9.8*   < > 9.1* 9.7* 8.6* 8.3* 7.8*  HCT 28.6*   < > 27.8* 29.4* 25.9* 24.9* 23.5*  MCV 88.5   < > 92.7 92.7 90.2 90.5 91.8  PLT 398   < > 386 412* 361 376 356   < > = values in this interval not displayed.   Basic Metabolic Panel: Recent Labs  Lab 04/02/22 0309 04/03/22 0457 04/04/22 0507 04/05/22 0615 04/06/22 0517 04/07/22 0605  NA 135 136 133* 133* 134* 132*  K 3.8 3.9 4.0 3.4* 3.3* 4.8  CL 102 105 100 98 97* 99  CO2 22 21* '23 27 29 27  '$ GLUCOSE 102* 123* 119* 137* 154* 186*  BUN 40* '23 18 14 14 16  '$ CREATININE 1.81* 1.19* 1.08* 1.05* 0.95 1.05*  CALCIUM 8.4* 8.7* 8.9 8.3* 7.9* 7.8*  MG 2.2 2.1 2.1 1.8 1.6* 2.6*  PHOS 3.4  --   --   --   --   --    GFR: Estimated Creatinine Clearance: 52 mL/min (A) (by C-G formula based on SCr of 1.05 mg/dL  (H)). Liver Function Tests: Recent Labs  Lab 04/01/22 1245 04/02/22 0309  AST 17  --   ALT 19  --   ALKPHOS 198*  --   BILITOT 0.3  --   PROT 7.8  --   ALBUMIN 3.5 2.4*   No results for input(s): "LIPASE", "AMYLASE" in the last 168 hours. No results for input(s): "AMMONIA" in the last 168 hours. Coagulation Profile: No results for input(s): "INR", "PROTIME" in the last 168 hours. Cardiac Enzymes: Recent Labs  Lab 04/02/22 0309  CKTOTAL 48   BNP (last 3 results) No results for input(s): "PROBNP" in the last 8760 hours. HbA1C: No results for input(s): "HGBA1C" in the last 72 hours. CBG: No results for input(s): "GLUCAP" in the last 168 hours. Lipid Profile: No results for input(s): "CHOL", "HDL", "LDLCALC", "TRIG", "CHOLHDL", "LDLDIRECT" in the last  72 hours. Thyroid Function Tests: No results for input(s): "TSH", "T4TOTAL", "FREET4", "T3FREE", "THYROIDAB" in the last 72 hours. Anemia Panel: No results for input(s): "VITAMINB12", "FOLATE", "FERRITIN", "TIBC", "IRON", "RETICCTPCT" in the last 72 hours. Sepsis Labs: No results for input(s): "PROCALCITON", "LATICACIDVEN" in the last 168 hours.  Recent Results (from the past 240 hour(s))  Culture, Urine     Status: Abnormal   Collection Time: 03/29/22  3:22 PM   Specimen: Urine, Clean Catch  Result Value Ref Range Status   Specimen Description   Final    URINE, CLEAN CATCH Performed at [facility], [address], [place], [place] [address]    Special Requests   Final    NONE Performed at [facility], [place] [address] [place], [place] [address]    Culture (A)  Final    <10,000 COLONIES/mL INSIGNIFICANT GROWTH Performed at [facility] [address] [place], [place] [address]    Report Status 03/30/2022 FINAL  Final  Urine Culture     Status: None   Collection Time: 04/02/22  4:50 AM   Specimen: Urine, Clean Catch  Result Value Ref Range Status   Specimen  Description   Final    URINE, CLEAN CATCH Performed at [facility], [place] [address], [place], [place] [address]    Special Requests   Final    NONE Performed at [facility], [place] [address] [place], [place] [address]    Culture   Final    NO GROWTH Performed at [facility], [place] [address], [place], [place] [address]    Report Status 04/03/2022 FINAL  Final         Radiology Studies: No results found.      Scheduled Meds:  budesonide (PULMICORT) nebulizer solution  0.5 mg Nebulization BID   Chlorhexidine Gluconate Cloth  6 each Topical Daily   dexamethasone (DECADRON) injection  4 mg Intravenous Q24H   fentaNYL   Intravenous Q4H   heparin injection (subcutaneous)  5,000 Units Subcutaneous Q8H   metoCLOPramide (REGLAN) injection  5 mg Intravenous Q8H   Continuous Infusions:  dextrose 5 % and 0.45% NaCl 100 mL/hr at 04/07/22 0313   octreotide (SANDOSTATIN) 500 mcg in sodium chloride 0.9 % 250 mL (2 mcg/mL) infusion 50 mcg/hr (04/07/22 0313)     LOS: 5 days   Time spent= 35 mins    Jaleena Viviani Arsenio Loader, MD [facility]  If 7PM-7AM, please contact night-coverage  04/07/2022, 7:39 AM

## 2022-04-07 NOTE — Consult Note (Signed)
Chief Complaint: Patient was seen in consultation today for pelvic SCC at the request of Dr Wynona Luna  Referring Physician(s): Dr. Wynona Luna  Supervising Physician: Ruthann Cancer  Patient Status: [facility] - In-pt  History of Present Illness: Mariah Schultz is a 66 y.o. 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  She was admitted with SBO which has not resolved and appears to involve the pelvic mass.  Additionally, she is having no output in her ostomy. A [place]em sump style NG tube has been placed and successful at relieving some pressure and nausea from her abdomen.  Surgery and Palliative Care are suggesting a venting g-tube for continued comfort. Family and patient have been involved in discussions and care and desire to prolong both quality and quantity of life.  Past Medical History:  Diagnosis Date   CAP (community acquired pneumonia) 09/10/2013   History of radiation therapy    pelvis 07/19/2021-09/01/2021  Dr Gery Pray   Hypertension    Papilloma of left breast    Papilloma of left breast 02/26/2019   Perforated appendicitis 03/19/2013    Past Surgical History:  Procedure Laterality Date   ABDOMINAL HYSTERECTOMY     BREAST EXCISIONAL BIOPSY Left 02/26/2019   B9 Biopsy   BREAST LUMPECTOMY WITH RADIOACTIVE SEED LOCALIZATION Left 02/26/2019   Procedure: LEFT BREAST LUMPECTOMY WITH RADIOACTIVE SEED LOCALIZATION;  Surgeon: Fanny Skates, MD;  Location: [place];  Service: General;  Laterality: Left;   CYSTOSCOPY W/ URETERAL STENT PLACEMENT Right 03/22/2022   Procedure: CYSTOSCOPY WITH BILATERAL RETROGRADE PYELOGRAM/ RIGHT URETERAL STENT PLACEMENT;  Surgeon: Lucas Mallow, MD;  Location: [facility];  Service: Urology;  Laterality: Right;   IR IMAGING GUIDED PORT INSERTION  07/21/2021   LAPAROSCOPIC APPENDECTOMY N/A 02/25/2013   Procedure: APPENDECTOMY LAPAROSCOPIC;  Surgeon: Harl Bowie, MD;  Location: [place];  Service:  General;  Laterality: N/A;   LAPAROSCOPY N/A 06/22/2021   Procedure: LAPAROSCOPY DIAGNOSTIC, DIVERTING LOOP COLOSTOMY, ANORECTAL EXAM UNDER ANESTHESIA, TRANSRECTAL CORE BIOPSIES.;  Surgeon: Lafonda Mosses, MD;  Location: [facility];  Service: Gynecology;  Laterality: N/A;    Allergies: Other and Pneumococcal vac polyvalent  Medications: Prior to Admission medications   Medication Sig Start Date End Date Taking? Authorizing Provider  acetaminophen (TYLENOL) 500 MG tablet Take 500 mg by mouth 3 (three) times daily as needed (pain).   Yes [provider]  lidocaine-prilocaine (EMLA) cream Apply 1 application topically as needed. Patient taking differently: Apply 1 application  topically as needed (pain). 07/08/21  Yes Orson Slick, MD  Multiple Vitamin (MULTIVITAMIN) tablet Take 1 tablet by mouth daily.   Yes [provider]  Omega-3 Fatty Acids (FISH OIL PO) Take 1 capsule by mouth daily.   Yes [provider]  ondansetron (ZOFRAN) 8 MG tablet Take 1 tablet (8 mg total) by mouth every 8 (eight) hours as needed. Patient taking differently: Take 8 mg by mouth as needed for vomiting or nausea. 07/08/21  Yes Orson Slick, MD  oxyCODONE ER [facility]) 9 MG C12A Take 9 mg by mouth every 12 (twelve) hours as needed. 03/29/22  Yes Dede Query T, PA-C  polyethylene glycol (MIRALAX / GLYCOLAX) 17 g packet Take 17 g by mouth daily.   Yes [provider]  sulfamethoxazole-trimethoprim (BACTRIM DS) 800-160 MG tablet Take 1 tablet by mouth 2 (two) times daily. 03/30/22  Yes Dede Query T, PA-C  valsartan-hydrochlorothiazide (DIOVAN-HCT) 160-12.5 MG tablet Take 1 tablet  by mouth daily. 04/07/21  Yes [provider]  vitamin C (ASCORBIC ACID) 500 MG tablet Take 500 mg by mouth daily.   Yes [provider]  prochlorperazine (COMPAZINE) 10 MG tablet Take 1 tablet (10 mg total) by mouth every 6 (six) hours as needed for nausea or vomiting. Patient  not taking: Reported on 02/15/2022 07/08/21   Orson Slick, MD     Family History  Problem Relation Age of Onset   Heart disease Father    Colon cancer Neg Hx    Breast cancer Neg Hx    Ovarian cancer Neg Hx    Pancreatic cancer Neg Hx    Prostate cancer Neg Hx    Endometrial cancer Neg Hx        Patient unaware    Social History   Socioeconomic History   Marital status: Married    Spouse name: Not on file   Number of children: Not on file   Years of education: Not on file   Highest education level: Not on file  Occupational History   Not on file  Tobacco Use   Smoking status: Every Day    Packs/day: 1.00    Years: 40.00    Total pack years: 40.00    Types: Cigarettes   Smokeless tobacco: Never  Vaping Use   Vaping Use: Never used  Substance and Sexual Activity   Alcohol use: Yes    Comment: occ   Drug use: No   Sexual activity: Not Currently    Birth control/protection: Surgical  Other Topics Concern   Not on file  Social History Narrative   Not on file   Social Determinants of Health   Financial Resource Strain: Low Risk  (06/16/2021)   Overall Financial Resource Strain (CARDIA)    Difficulty of Paying Living Expenses: Not very hard  Food Insecurity: No Food Insecurity (06/16/2021)   Hunger Vital Sign    Worried About Running Out of Food in the Last Year: Never true    Ran Out of Food in the Last Year: Never true  Transportation Needs: No Transportation Needs (06/16/2021)   PRAPARE - Hydrologist (Medical): No    Lack of Transportation (Non-Medical): No  Physical Activity: Not on file  Stress: Not on file  Social Connections: Not on file    Review of Systems  Constitutional:  Positive for activity change, appetite change and fatigue.  HENT: Negative.    Eyes: Negative.   Respiratory:  Positive for shortness of breath.   Cardiovascular: Negative.   Gastrointestinal:  Positive for abdominal distention, abdominal pain,  nausea and rectal pain.  Endocrine: Negative.   Genitourinary:  Positive for decreased urine volume and difficulty urinating.  Allergic/Immunologic: Negative.     Vital Signs: BP (!) 149/72 (BP Location: Left Arm)   Pulse 70   Temp 97.8 F (36.6 C) (Oral)   Resp 18   Ht '5\' 4"'$  (1.626 m)   Wt 168 lb 11.2 oz (76.5 kg)   SpO2 97%   BMI 28.96 kg/m   Physical Exam Constitutional:      Appearance: She is not toxic-appearing.     Interventions: Nasal cannula in place.  HENT:     Mouth/Throat:     Mouth: Mucous membranes are dry.     Pharynx: Oropharynx is clear.  Cardiovascular:     Rate and Rhythm: Normal rate and regular rhythm.  Pulmonary:     Effort: Pulmonary effort is  normal. No respiratory distress.  Abdominal:     General: There is distension.     Tenderness: There is abdominal tenderness.  Skin:    General: Skin is warm and dry.  Neurological:     General: No focal deficit present.     Mental Status: She is oriented to person, place, and time and easily aroused.  Psychiatric:        Mood and Affect: Mood normal.        Behavior: Behavior normal. Behavior is cooperative.        Thought Content: Thought content normal.     Imaging: DG Abd Portable 1V-Small Bowel Obstruction Protocol-initial, 8 hr delay  Result Date: 04/05/2022 CLINICAL DATA:  Small-bowel protocol.  8 hour film. EXAM: PORTABLE ABDOMEN - 1 VIEW COMPARISON:  04/04/2022 FINDINGS: Nasogastric tube tip in the stomach. Nasogastric tube side hole above the diaphragm as seen previously. Double-J ureteral stent on the right. Dilated fluid and air-filled small bowel again demonstrated. Presumably administered oral contrast is poorly demonstrated, probably diffuse within fluid-filled small bowel. Findings are consistent with ongoing small bowel obstruction. IMPRESSION: Administered contrast poorly demonstrated, probably diffused within fluid distended small bowel, consistent with ongoing small bowel obstruction.  Electronically Signed   By: Nelson Chimes M.D.   On: 04/05/2022 08:03   DG Abd Portable 1V-Small Bowel Protocol-Position Verification  Result Date: 04/04/2022 CLINICAL DATA:  Nasogastric placement EXAM: PORTABLE ABDOMEN - 1 VIEW COMPARISON:  CT same day FINDINGS: Nasogastric tube tip enters the stomach. Side hole remains above the diaphragm. IMPRESSION: Nasogastric tube tip enters the stomach. Side hole remains above the diaphragm. Electronically Signed   By: Nelson Chimes M.D.   On: 04/04/2022 17:02   CT ABDOMEN PELVIS WO CONTRAST  Result Date: 04/04/2022 CLINICAL DATA:  Follow-up suspected bowel obstruction. 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 EXAM: CT ABDOMEN AND PELVIS WITHOUT CONTRAST TECHNIQUE: Multidetector CT imaging of the abdomen and pelvis was performed following the standard protocol without IV contrast. RADIATION DOSE REDUCTION: This exam was performed according to the departmental dose-optimization program which includes automated exposure control, adjustment of the mA and/or kV according to patient size and/or use of iterative reconstruction technique. COMPARISON:  03/20/2022 FINDINGS: Lower chest: Scarring versus platelike atelectasis is identified within both lung bases. No pleural effusion or airspace consolidation. Hepatobiliary: No suspicious liver lesion identified. Gallbladder appears normal. No bile duct dilatation. Pancreas: Unremarkable. No pancreatic ductal dilatation or surrounding inflammatory changes. Spleen: Normal in size without focal abnormality. Adrenals/Urinary Tract: Normal adrenal glands. Left kidney appears normal. Right-sided nephroureteral stent is been placed since the previous exam. Persistent right hydronephrosis is identified, not significantly changed from the previous exam. No left-sided hydronephrosis. The distal end of the right nephroureteral stent is in the expected location of the right UVJ.  Bladder is otherwise unremarkable. Stomach/Bowel: There is diffuse distension of the gastric limb. Multiple dilated loops of small bowel with air-fluid levels are identified. The small bowel loops measure up to 3.5 cm. Findings are consistent with small bowel obstruction. The transition point is in the pelvis along the anterior margin of known pelvic mass, image 71/2 and coronal image 83/3. There is a left sided descending colostomy with normal caliber colon up to the ostomy site. The sigmoid colon and rectum appears encased by the pelvic mass. Vascular/Lymphatic: Aortic atherosclerosis. No abdominal adenopathy. No enlarged pelvic or inguinal lymph nodes. Reproductive: The uterus is not visualized separate from pelvic soft tissue mass.  Other: The soft tissue mass within the pelvis is difficult to separate from the adjacent unopacified pelvic bowel loops. This measures approximately 10.6 x 7.7 cm, image 70/2. Not significantly changed from previous exam. No significant free fluid identified. No signs of pneumoperitoneum. Musculoskeletal: No acute or significant osseous findings. IMPRESSION: 1. Examination is positive for small bowel obstruction. Transition point is in the pelvis along the anterior margin of known pelvic mass. 2. No significant change in size of pelvic soft tissue mass which is difficult to separate from the adjacent unopacified pelvic bowel loops. 3. Interval placement of right-sided nephroureteral stent with persistent right hydronephrosis. 4. Aortic Atherosclerosis (ICD10-I70.0). Electronically Signed   By: Kerby Moors M.D.   On: 04/04/2022 11:20   DG Abd 1 View  Result Date: 04/03/2022 CLINICAL DATA:  Abdominal pain. EXAM: ABDOMEN - 1 VIEW COMPARISON:  CT renal stone 03/20/2022 FINDINGS: Right ureteral stent is in place. No suspicious calcifications are identified. There are mildly dilated air-filled small bowel loops measuring up to 3.6 cm in the left abdomen, new from prior. Osseous  structures are unremarkable. IMPRESSION: 1. New mildly dilated air-filled small bowel loops in the left abdomen may represent ileus/enteritis or partial small bowel obstruction. Correlate clinically. 2.  Right ureteral stent in place. Electronically Signed   By: Ronney Asters M.D.   On: 04/03/2022 19:08   US RENAL  Result Date: 04/01/2022 CLINICAL DATA:  Acute kidney injury.  Ureteral stent. EXAM: RENAL / URINARY TRACT ULTRASOUND COMPLETE COMPARISON:  CT abdomen and pelvis 03/20/2022 FINDINGS: Right Kidney: Renal measurements: 10.4 x 4.6 x 5.2 cm = volume: 130 mL. Echogenicity is increased. There is moderate hydronephrosis. Right ureteral stent is partially visualized. Left Kidney: Renal measurements: 9.7 x 4.4 x 3.6 cm = volume: 82 mL. Echogenicity within normal limits. No mass or hydronephrosis visualized. Bladder: Under distended and not well evaluated. Other: None. IMPRESSION: 1. Moderate right-sided hydronephrosis persists as seen on recent CT. 2.     Right ureteral stent in place. Electronically Signed   By: Ronney Asters M.D.   On: 04/01/2022 19:37   DG C-Arm 1-60 Min-No Report  Result Date: 03/22/2022 Fluoroscopy was utilized by the requesting physician.  No radiographic interpretation.   CT Renal Stone Study  Result Date: 03/20/2022 CLINICAL DATA:  Flank pain, hematuria EXAM: CT ABDOMEN AND PELVIS WITHOUT CONTRAST TECHNIQUE: Multidetector CT imaging of the abdomen and pelvis was performed following the standard protocol without IV contrast. RADIATION DOSE REDUCTION: This exam was performed according to the departmental dose-optimization program which includes automated exposure control, adjustment of the mA and/or kV according to patient size and/or use of iterative reconstruction technique. COMPARISON:  05/11/2021 FINDINGS: Lower chest: Small linear densities in the lower lung fields suggest scarring or subsegmental atelectasis. There is ectasia of bronchi. Hepatobiliary: Liver is enlarged  measuring 19.6 cm in length. No focal abnormalities are seen. Gallbladder is unremarkable. Pancreas: There is atrophy and pancreas. No focal abnormalities are seen. Spleen: Unremarkable. Adrenals/Urinary Tract: Adrenals are unremarkable. There is no hydronephrosis on the left side. There is moderate to marked right hydronephrosis. There is dilation of right ureter to the level of pelvic inlet. There are no opaque renal or ureteral stones. There is a soft tissue mass measuring 11.1 x 7 cm in size and pelvis extending more to the right. Left ureter is not dilated. Urinary bladder is not distended. Stomach/Bowel: Stomach is unremarkable. Small bowel loops are not dilated. The appendix is not seen. Colostomy is noted in left  mid abdomen. Diverticula are seen in colon without signs of focal diverticulitis. Vascular/Lymphatic: Scattered arterial calcifications are seen. Reproductive: Uterus could not be distinctly identified. Other: There is no ascites or pneumoperitoneum. Small bilateral inguinal hernias containing fat are noted. Small umbilical hernia containing fat is seen. Musculoskeletal: There is minimal anterolisthesis at L4-L5 level. IMPRESSION: There is no evidence of intestinal obstruction or pneumoperitoneum. There is interval appearance of moderate to severe right hydronephrosis. There are no demonstrable opaque renal or ureteral stones. There is 11.1 x 7 cm soft tissue mass in pelvic cavity extending more to the right. Findings suggest possible high-grade obstruction of the distal right ureter caused by extrinsic compression or infiltration by the malignant neoplasm in pelvic cavity. Diverticulosis of colon without signs of focal diverticulitis. 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 Other findings as described in the body of the report. Electronically Signed   By: Elmer Picker M.D.   On: 03/20/2022 14:42    Labs:  CBC: Recent Labs    04/04/22 0507 04/05/22 0615 04/06/22 0517  04/07/22 0605  WBC 15.1* 13.2* 16.8* 16.4*  HGB 9.7* 8.6* 8.3* 7.8*  HCT 29.4* 25.9* 24.9* 23.5*  PLT 412* 361 376 356   BMP: Recent Labs    04/04/22 0507 04/05/22 0615 04/06/22 0517 04/07/22 0605  NA 133* 133* 134* 132*  K 4.0 3.4* 3.3* 4.8  CL 100 98 97* 99  CO2 '23 27 29 27  '$ GLUCOSE 119* 137* 154* 186*  BUN '18 14 14 16  '$ CALCIUM 8.9 8.3* 7.9* 7.8*  CREATININE 1.08* 1.05* 0.95 1.05*  GFRNONAA 56* 58* >60 58*    LIVER FUNCTION TESTS: Recent Labs    03/20/22 1431 03/21/22 1212 03/29/22 1344 04/01/22 1245 04/02/22 0309  BILITOT 0.3 0.3 0.4 0.3  --   AST 14* 12* 20 17  --   ALT '16 12 19 19  '$ --   ALKPHOS 107 111 151* 198*  --   PROT 8.1 7.8 7.4 7.8  --   ALBUMIN 3.1* 3.6 3.4* 3.5 2.4*    Assessment and Plan:  Malignant Small Bowel Obstruction --symptoms improved with NG tube --patient and family wish to continue pursuing quality of life measures --imaging suggests candidacy for percutaneous placement of venting gastrostomy tube. --labs, heparin hold, and antibiotic have been ordered --tentatively to be done Thursday or Friday this week, schedule permitting.  Risks and benefits image guided gastrostomy tube placement was discussed with the patient and family, including, but not limited to the need for a barium enema during the procedure, bleeding, infection, peritonitis and/or damage to adjacent structures.  All of the patient's questions were answered, patient is agreeable to proceed.  Consent signed and in chart.   Thank you for this interesting consult.  I greatly enjoyed meeting Colton Engdahl and look forward to participating in their care.  A copy of this report was sent to the requesting provider on this date.  Electronically Signed: Pasty Spillers, PA 04/07/2022, 3:56 PM   I spent a total of 40 Minutes in face to face in clinical consultation, greater than 50% of which was counseling/coordinating care for malignant small bowel obstruction.

## 2022-04-08 ENCOUNTER — Inpatient Hospital Stay (HOSPITAL_COMMUNITY): Payer: Medicare HMO

## 2022-04-08 DIAGNOSIS — K5669 Other partial intestinal obstruction: Secondary | ICD-10-CM | POA: Diagnosis not present

## 2022-04-08 DIAGNOSIS — K56609 Unspecified intestinal obstruction, unspecified as to partial versus complete obstruction: Secondary | ICD-10-CM | POA: Diagnosis not present

## 2022-04-08 DIAGNOSIS — C44529 Squamous cell carcinoma of skin of other part of trunk: Secondary | ICD-10-CM | POA: Diagnosis not present

## 2022-04-08 DIAGNOSIS — N179 Acute kidney failure, unspecified: Secondary | ICD-10-CM | POA: Diagnosis not present

## 2022-04-08 DIAGNOSIS — C763 Malignant neoplasm of pelvis: Secondary | ICD-10-CM | POA: Diagnosis not present

## 2022-04-08 HISTORY — PX: IR GASTROSTOMY TUBE MOD SED: IMG625

## 2022-04-08 LAB — BASIC METABOLIC PANEL
Anion gap: 7 (ref 5–15)
BUN: 15 mg/dL (ref 8–23)
CO2: 26 mmol/L (ref 22–32)
Calcium: 8.3 mg/dL — ABNORMAL LOW (ref 8.9–10.3)
Chloride: 100 mmol/L (ref 98–111)
Creatinine, Ser: 0.98 mg/dL (ref 0.44–1.00)
GFR, Estimated: >60 mL/min (ref >60)
Glucose, Bld: 147 mg/dL — ABNORMAL HIGH (ref 70–99)
Potassium: 4.3 mmol/L (ref 3.5–5.1)
Sodium: 133 mmol/L — ABNORMAL LOW (ref 135–145)

## 2022-04-08 LAB — CBC
HCT: 24.1 % — ABNORMAL LOW (ref 36.0–46.0)
Hemoglobin: 7.7 g/dL — ABNORMAL LOW (ref 12.0–15.0)
MCH: 30.2 pg (ref 26.0–34.0)
MCHC: 32 g/dL (ref 30.0–36.0)
MCV: 94.5 fL (ref 80.0–100.0)
Platelets: 412 10*3/uL — ABNORMAL HIGH (ref 150–400)
RBC: 2.55 MIL/uL — ABNORMAL LOW (ref 3.87–5.11)
RDW: 15 % (ref 11.5–15.5)
WBC: 8.7 10*3/uL (ref 4.0–10.5)
nRBC: 0 % (ref 0.0–0.2)

## 2022-04-08 LAB — MAGNESIUM: Magnesium: 2.3 mg/dL (ref 1.7–2.4)

## 2022-04-08 LAB — PROTIME-INR
INR: 1.1 (ref 0.8–1.2)
Prothrombin Time: 14.3 seconds (ref 11.4–15.2)

## 2022-04-08 MED ORDER — IOHEXOL 300 MG/ML  SOLN
50.0000 mL | Freq: Once | INTRAMUSCULAR | Status: AC | PRN
  Administered 2022-04-08: 15 mL

## 2022-04-08 MED ORDER — MIDAZOLAM HCL 2 MG/2ML IJ SOLN
INTRAMUSCULAR | Status: AC
  Filled 2022-04-08: qty 2

## 2022-04-08 MED ORDER — FENTANYL CITRATE (PF) 100 MCG/2ML IJ SOLN
INTRAMUSCULAR | Status: AC | PRN
  Administered 2022-04-08 (×2): 50 ug via INTRAVENOUS

## 2022-04-08 MED ORDER — FENTANYL CITRATE (PF) 100 MCG/2ML IJ SOLN
INTRAMUSCULAR | Status: AC
  Filled 2022-04-08: qty 2

## 2022-04-08 MED ORDER — MIDAZOLAM HCL 2 MG/2ML IJ SOLN
INTRAMUSCULAR | Status: AC | PRN
  Administered 2022-04-08 (×2): 1 mg via INTRAVENOUS

## 2022-04-08 MED ORDER — LIDOCAINE HCL (PF) 1 % IJ SOLN
INTRAMUSCULAR | Status: AC | PRN
  Administered 2022-04-08: 15 mL

## 2022-04-08 MED ORDER — AMMONIUM LACTATE 12 % EX LOTN: TOPICAL_LOTION | CUTANEOUS | Status: DC | PRN

## 2022-04-08 MED ORDER — LIDOCAINE HCL 1 % IJ SOLN
INTRAMUSCULAR | Status: AC
  Filled 2022-04-08: qty 20

## 2022-04-08 MED ORDER — ORAL CARE MOUTH RINSE
15.0000 mL | OROMUCOSAL | Status: DC
  Administered 2022-04-08 – 2022-04-19 (×40): 15 mL via OROMUCOSAL

## 2022-04-08 NOTE — Progress Notes (Signed)
Progress Note: General Surgery Service   Chief Complaint/Subjective: Anticipating g-tube later today.  Objective: Vital signs in last 24 hours: Temp:  [97.8 F (36.6 C)-98.3 F (36.8 C)] 97.9 F (36.6 C) (08/04 0450) Pulse Rate:  [70-72] 70 (08/04 0450) Resp:  [15-21] 18 (08/04 0450) BP: (143-152)/(56-72) 152/67 (08/04 0450) SpO2:  [94 %-100 %] 100 % (08/04 0450) FiO2 (%):  [0 %-36 %] 36 % (08/03 2053) Last BM Date :  (Has a colostomy, no output)  Intake/Output from previous day: 08/03 0701 - 08/04 0700 In: 2786.6 [I.V.:2786.6] Out: 1125 [Urine:1050; Emesis/NG output:75] Intake/Output this shift: No intake/output data recorded.  PE: Abd: distended, but still softer than she has been, mild tenderness to palpation.  Colostomy with no output.  Stoma is pink, viable, and patent.  Mildly edematous.  NGT with some bilious output, cannister with minimal output.   Lab Results: CBC  Recent Labs    04/06/22 0517 04/07/22 0605  WBC 16.8* 16.4*  HGB 8.3* 7.8*  HCT 24.9* 23.5*  PLT 376 356    BMET Recent Labs    04/06/22 0517 04/07/22 0605  NA 134* 132*  K 3.3* 4.8  CL 97* 99  CO2 29 27  GLUCOSE 154* 186*  BUN 14 16  CREATININE 0.95 1.05*  CALCIUM 7.9* 7.8*    PT/INR No results for input(s): "LABPROT", "INR" in the last 72 hours. ABG No results for input(s): "PHART", "HCO3" in the last 72 hours.  Invalid input(s): "PCO2", "PO2"  Anti-infectives: Anti-infectives (From admission, onward)    Start     Dose/Rate Route Frequency Ordered Stop   04/08/22 0600  ceFAZolin (ANCEF) IVPB 2g/100 mL premix        2 g 200 mL/hr over 30 Minutes Intravenous To Radiology 04/07/22 1618 04/09/22 0600   04/01/22 1930  cefTRIAXone (ROCEPHIN) 1 g in sodium chloride 0.9 % 100 mL IVPB        1 g 200 mL/hr over 30 Minutes Intravenous Every 24 hours 04/01/22 1830 04/05/22 2100       Medications: Scheduled Meds:  budesonide (PULMICORT) nebulizer solution  0.5 mg Nebulization  BID   Chlorhexidine Gluconate Cloth  6 each Topical Daily   dexamethasone (DECADRON) injection  4 mg Intravenous Q24H   fentaNYL   Intravenous Q4H   heparin injection (subcutaneous)  5,000 Units Subcutaneous Q8H   metoCLOPramide (REGLAN) injection  5 mg Intravenous Q8H   mouth rinse  15 mL Mouth Rinse 4 times per day   Continuous Infusions:   ceFAZolin (ANCEF) IV     dextrose 5 % and 0.45% NaCl 100 mL/hr at 04/07/22 1956   octreotide (SANDOSTATIN) 500 mcg in sodium chloride 0.9 % 250 mL (2 mcg/mL) infusion 50 mcg/hr (04/08/22 0447)   PRN Meds:.acetaminophen **OR** acetaminophen, dextromethorphan-guaiFENesin, diazepam, diphenhydrAMINE **OR** diphenhydrAMINE, hydrALAZINE, HYDROmorphone (DILAUDID) injection, ipratropium-albuterol, metoprolol tartrate, naloxone **AND** sodium chloride flush, ondansetron **OR** ondansetron (ZOFRAN) IV, phenol, prochlorperazine  Assessment/Plan: Squamous Cell Carcinoma of pelvis of unclear etiology SBO - CT today with SBO with transition in pelvis along anterior margin of pelvic mass - unfortunately she is not resolving her obstruction yet.  She has no colostomy output. -long discussion was had in conjunction with palliative care, primary service, and surgical service today with the family and patient.  Surgical options were discussed with the patient but also risks all of these options etc.  After long discussion surgical intervention to assess for bypass ability was taken off the table due to high risk.  Ultimately, the  decision was for a venting g-tube and await further recommendations until after oncology speaks to the patient and the family. -g-tube placement by IR requested by palliative.   Surgery team is available as needed for any further support in this difficult situation.   - below per TRH -  CKD stage III UTI HTN Anemia of chronic disease     I reviewed Consultant palliative notes, hospitalist notes, last 24 h vitals and pain scores, last 48  h intake and output, last 24 h labs and trends, and last 24 h imaging results.      LOS: 6 days   Felicie Morn, [facility] Use [email or web address] to contact on call provider

## 2022-04-08 NOTE — Progress Notes (Signed)
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 Patient is being admitted from cancer center for weakness, dehydration and AKI per Dr. Dorsey's request.  She was started on Bactrim outpatient 3 days prior to admission with concerns of UTI.  Hospital course complicated by SBO possibly from pelvic mass.  General surgery is following.  Status post PEG tube placement 8/4 in the morning.   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  Concerned secondary to pelvic mass.  C NG tube to wall suction.  General surgery following.  Ongoing discussion regarding further surgical options-extensive surgery versus only venting G-tube.  Overall very poor surgical options and outcome if they were to proceed with extensive surgery with further diminishing her quality of life.  G-tube placed this morning on 8/4. We will need to address her nutrition, TPN?  Palliative care/oncology to discuss  further. Pain secondary to her malignancy.   Sepsis secondary to UTI recent pyuria with hematuria: POA -Sepsis physiology evidenced by fever, leukocytosis.  Hold off on Bactrim.  Cultures remain negative.  WBC is trending up.  Completed 5 days of IV Rocephin.  Suspect some reactive changes but if she starts spiking fever we will have to broaden our antibiotics.  Anemia of chronic disease:  -Currently hemoglobin appears to be stable around 9.0.  Drifting down slowly, no obvious evidence of bleeding.  Transfuse if necessary.  Poorly differentiated high-grade SCC of unclear source.  Chronic pain - Pelvic mass has caused ureteral obstruction status post stent in place.  Dr. Dorsey to see the patient as the masses possibly causing bowel obstruction. -Palliative care managing pain control.  AKI on CKD-3A/azotemia:  Resolved  -Suspect from dehydration.  Admission creatinine 2.26.  Creatinine now at baseline of 1.0.  Has right-sided stent placed by urology on 7/18 for obstructions likely secondary to mass.  Renal ultrasound is negative.   Hyponatremia/dehydration:  improved   Essential hypertension: -IV as needed ordered   Diverting colostomy/constipation -Bowel regimen.  Wound care team consulted.   Overall Poor Prognosis. Palliative care team following.    DVT prophylaxis: SQ heparin. Code Status: Full code Family Communication: Family at bedside   Ongoing evaluation for abdominal distention.  PEG tube placed    Subjective: I met with the patient, husband and daughter at bedside.  Extensively went over her diagnosis and overall poor prognosis.  They are telling me that they would like to try 1-2 treatments of Keytruda every 3 weeks as planned by oncology and monitor progression.  Also raised concern about her nutritional   status during this time at this time they have not made any decisions overall.  Currently she is running on PCA pump.  Examination: Constitutional: Chronically  ill-appearing. Respiratory: Clear to auscultation bilaterally Cardiovascular: Normal sinus rhythm, no rubs Abdomen: Abdomen is distended, diminished bowel sounds.  Has ostomy bag and new PEG tube in place.  Minimal tenderness to deep palpation. Musculoskeletal: No edema noted Skin: No rashes seen Neurologic: CN 2-12 grossly intact.  And nonfocal Psychiatric: Normal judgment and insight. Alert and oriented x 3. Normal mood.  Ostomy in place PEG tube in place Objective: Vitals:   04/08/22 0920 04/08/22 0925 04/08/22 0930 04/08/22 1111  BP: (!) 175/90 132/70 (!) 140/83   Pulse: 69  61   Resp: (!) 7 (!) 7 17 13  Temp:      TempSrc:      SpO2: 91%  99%   Weight:      Height:        Intake/Output Summary (Last 24 hours) at 04/08/2022 1322 Last data filed at 04/08/2022 0500 Gross per 24 hour  Intake 2786.56 ml  Output 1125 ml  Net 1661.56 ml   Filed Weights   04/03/22 2024  Weight: 76.5 kg     Data Reviewed:   CBC: Recent Labs  Lab 04/04/22 0507 04/05/22 0615 04/06/22 0517 04/07/22 0605 04/08/22 1028  WBC 15.1* 13.2* 16.8* 16.4* 8.7  HGB 9.7* 8.6* 8.3* 7.8* 7.7*  HCT 29.4* 25.9* 24.9* 23.5* 24.1*  MCV 92.7 90.2 90.5 91.8 94.5  PLT 412* 361 376 356 412*   Basic Metabolic Panel: Recent Labs  Lab 04/02/22 0309 04/03/22 0457 04/04/22 0507 04/05/22 0615 04/06/22 0517 04/07/22 0605 04/08/22 1028  NA 135   < > 133* 133* 134* 132* 133*  K 3.8   < > 4.0 3.4* 3.3* 4.8 4.3  CL 102   < > 100 98 97* 99 100  CO2 22   < > 23 27 29 27 26  GLUCOSE 102*   < > 119* 137* 154* 186* 147*  BUN 40*   < > 18 14 14 16 15  CREATININE 1.81*   < > 1.08* 1.05* 0.95 1.05* 0.98  CALCIUM 8.4*   < > 8.9 8.3* 7.9* 7.8* 8.3*  MG 2.2   < > 2.1 1.8 1.6* 2.6* 2.3  PHOS 3.4  --   --   --   --   --   --    < > = values in this interval not displayed.   GFR: Estimated Creatinine Clearance: 55.8 mL/min (by C-G formula based on SCr of 0.98 mg/dL). Liver Function Tests: Recent Labs  Lab  04/02/22 0309  ALBUMIN 2.4*   No results for input(s): "LIPASE", "AMYLASE" in the last 168 hours. No results for input(s): "AMMONIA" in the last 168 hours. Coagulation Profile: Recent Labs  Lab 04/08/22 1126  INR 1.1   Cardiac Enzymes: Recent Labs  Lab 04/02/22 0309  CKTOTAL 48   BNP (last 3 results) No results for input(s): "PROBNP" in the last 8760 hours. HbA1C: No results for input(s): "HGBA1C" in the last 72 hours. CBG: No results for input(s): "GLUCAP" in the last 168 hours. Lipid Profile: No results for input(s): "CHOL", "HDL", "LDLCALC", "TRIG", "CHOLHDL", "LDLDIRECT" in the last 72 hours. Thyroid Function Tests: No results for input(s): "TSH", "T4TOTAL", "FREET4", "T3FREE", "THYROIDAB" in the last 72 hours. Anemia Panel: No results for input(s): "VITAMINB12", "FOLATE", "FERRITIN", "TIBC", "IRON", "RETICCTPCT" in the last 72 hours. Sepsis Labs: No results for input(s): "  PROCALCITON", "LATICACIDVEN" in the last 168 hours.  Recent Results (from the past 240 hour(s))  Culture, Urine     Status: Abnormal   Collection Time: 03/29/22  3:22 PM   Specimen: Urine, Clean Catch  Result Value Ref Range Status   Specimen Description   Final    URINE, CLEAN CATCH Performed at [facility], [address] [place], [place] [address]    Special Requests   Final    NONE Performed at [facility], [address] [place], [place] [address]    Culture (A)  Final    <10,000 COLONIES/mL INSIGNIFICANT GROWTH Performed at [facility], [address] [place], [place] [address]    Report Status 03/30/2022 FINAL  Final  Urine Culture     Status: None   Collection Time: 04/02/22  4:50 AM   Specimen: Urine, Clean Catch  Result Value Ref Range Status   Specimen Description   Final    URINE, CLEAN CATCH Performed at [facility], [address] [place], [place] [address]    Special Requests   Final     NONE Performed at [facility], [address] [place], [place] [address]    Culture   Final    NO GROWTH Performed at [facility], [address] , [place] [address]    Report Status 04/03/2022 FINAL  Final         Radiology Studies: IR GASTROSTOMY TUBE MOD SED  Result Date: 04/08/2022 INDICATION: 65-year-old with squamous cell carcinoma in the pelvis and bowel obstruction. Patient is not a good surgical candidate and plan for a venting gastrostomy tube. EXAM: PERCUTANEOUS GASTROSTOMY TUBE WITH FLUOROSCOPIC GUIDANCE Physician: Adam R. Henn, MD MEDICATIONS: Angiogram ANESTHESIA/SEDATION: Moderate (conscious) sedation was employed during this procedure. A total of Versed 2.0mg and fentanyl 100 mcg was administered intravenously at the order of the provider performing the procedure. Total intra-service moderate sedation time: 20 minutes. Patient's level of consciousness and vital signs were monitored continuously by radiology nurse throughout the procedure under the supervision of the provider performing the procedure. FLUOROSCOPY: Radiation Exposure Index (as provided by the fluoroscopic device): 127 mGy Kerma COMPLICATIONS: None immediate. PROCEDURE: The procedure was explained to the patient. The risks and benefits of the procedure were discussed and the patient's questions were addressed. Informed consent was obtained from the patient. The patient was placed on the interventional table. An orogastric tube was placed with fluoroscopic guidance. The anterior abdomen was prepped and draped in sterile fashion. Maximal barrier sterile technique was utilized including caps, mask, sterile gowns, sterile gloves, sterile drape, hand hygiene and skin antiseptic. Stomach was inflated with air through the orogastric tube. The skin and subcutaneous tissues were anesthetized with 1% lidocaine. A 17 gauge needle was directed into the distended stomach with fluoroscopic guidance.  A wire was advanced into the stomach and a T-tact was deployed. A 9-French vascular sheath was placed and the orogastric tube was snared using a [employer] snare device. The orogastric tube and snare were pulled out of the patient's mouth. The snare device was connected to a 20-French gastrostomy tube. The snare device and gastrostomy tube were pulled through the patient's mouth and out the anterior abdominal wall. The gastrostomy tube was cut to an appropriate length. Contrast injection through gastrostomy tube confirmed placement within the stomach. Fluoroscopic images were obtained for documentation. The gastrostomy tube was flushed with normal saline. IMPRESSION: Successful fluoroscopic guided   percutaneous gastrostomy tube placement. Electronically Signed   By: Markus Daft M.D.   On: 04/08/2022 11:54   DG Abd 1 View  Result Date: 04/07/2022 CLINICAL DATA:  Abdominal distension. EXAM: ABDOMEN - 1 VIEW COMPARISON:  Prior radiographs 04/04/2022 and 04/05/2022. Prior CT scan 04/04/2022 FINDINGS: The NG tube is been removed. The right sided double-J ureteral catheter remains in good position. Persistent air distended bowel. No obvious free air. Patient has a left lower quadrant colostomy. IMPRESSION: 1. Persistent air distended bowel. 2. Removal of NG tube. Electronically Signed   By: Marijo Sanes M.D.   On: 04/07/2022 08:28        Scheduled Meds:  budesonide (PULMICORT) nebulizer solution  0.5 mg Nebulization BID   Chlorhexidine Gluconate Cloth  6 each Topical Daily   dexamethasone (DECADRON) injection  4 mg Intravenous Q24H   fentaNYL   Intravenous Q4H   heparin injection (subcutaneous)  5,000 Units Subcutaneous Q8H   metoCLOPramide (REGLAN) injection  5 mg Intravenous Q8H   mouth rinse  15 mL Mouth Rinse 4 times per day   Continuous Infusions:  dextrose 5 % and 0.45% NaCl 100 mL/hr at 04/07/22 1956   octreotide (SANDOSTATIN) 500 mcg in sodium chloride 0.9 % 250 mL (2 mcg/mL) infusion 50 mcg/hr  (04/08/22 0447)     LOS: 6 days   Time spent= 35 mins    Taletha Twiford Arsenio Loader, MD [facility]  If 7PM-7AM, please contact night-coverage  04/08/2022, 1:22 PM

## 2022-04-08 NOTE — Plan of Care (Signed)
Pt self administering pain meds via PCA.  States pain at 5/10 at this time. G tube currently draining to gravity.  Will change to LIWS per MD orders.  Problem: Activity: Goal: Risk for activity intolerance will decrease Outcome: Not Progressing   Problem: Nutrition: Goal: Adequate nutrition will be maintained Outcome: Not Progressing   Problem: Elimination: Goal: Will not experience complications related to bowel motility Outcome: Not Progressing   Problem: Pain Managment: Goal: General experience of comfort will improve Outcome: Not Progressing

## 2022-04-08 NOTE — Procedures (Signed)
Interventional Radiology Procedure:   Indications: Squamous cell carcinoma of the pelvis with bowel obstruction.  Venting gastrostomy requested.  Procedure: Gastrostomy tube placement  Findings: 20 Fr gastrostomy tube placed in distal stomach body region.    Complications: No immediate complications noted.     EBL: Minimal  Plan: Place gastrostomy tube to low intermittent wall suction.    Sayana Salley R. Anselm Pancoast, MD  Pager: [phone]

## 2022-04-09 DIAGNOSIS — N179 Acute kidney failure, unspecified: Secondary | ICD-10-CM | POA: Diagnosis not present

## 2022-04-09 LAB — BASIC METABOLIC PANEL
Anion gap: 8 (ref 5–15)
BUN: 15 mg/dL (ref 8–23)
CO2: 26 mmol/L (ref 22–32)
Calcium: 8 mg/dL — ABNORMAL LOW (ref 8.9–10.3)
Chloride: 98 mmol/L (ref 98–111)
Creatinine, Ser: 0.97 mg/dL (ref 0.44–1.00)
GFR, Estimated: >60 mL/min (ref >60)
Glucose, Bld: 153 mg/dL — ABNORMAL HIGH (ref 70–99)
Potassium: 4.3 mmol/L (ref 3.5–5.1)
Sodium: 132 mmol/L — ABNORMAL LOW (ref 135–145)

## 2022-04-09 LAB — CBC
HCT: 22.9 % — ABNORMAL LOW (ref 36.0–46.0)
Hemoglobin: 7.4 g/dL — ABNORMAL LOW (ref 12.0–15.0)
MCH: 30.5 pg (ref 26.0–34.0)
MCHC: 32.3 g/dL (ref 30.0–36.0)
MCV: 94.2 fL (ref 80.0–100.0)
Platelets: 370 10*3/uL (ref 150–400)
RBC: 2.43 MIL/uL — ABNORMAL LOW (ref 3.87–5.11)
RDW: 14.6 % (ref 11.5–15.5)
WBC: 8.1 10*3/uL (ref 4.0–10.5)
nRBC: 0 % (ref 0.0–0.2)

## 2022-04-09 LAB — MAGNESIUM: Magnesium: 2 mg/dL (ref 1.7–2.4)

## 2022-04-09 NOTE — Progress Notes (Signed)
Hematology/Oncology Progress Note  Clinical Summary: Mrs. Katerine Morua is a 66 year old female with medical history significant for 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  Interval History: -- venting G tube placed yesterday --small volume output in ostomy bag. PO liquid diet initiated yesterday.  --Patient continues to struggle with pain control.  Has fentanyl patch and fentanyl injection as needed as well as Dilaudid as needed.  Palliative care is assisting in the management of this. --Kidney function improving steady, Cr 0.97   O:  Vitals:   04/09/22 0627 04/09/22 0839  BP:    Pulse:    Resp: 10 12  Temp:    SpO2:  98%      Latest Ref Rng & Units 04/09/2022    5:42 AM 04/08/2022   10:28 AM 04/07/2022    6:05 AM  CMP  Glucose 70 - 99 mg/dL 153  147  186   BUN 8 - 23 mg/dL '15  15  16   '$ Creatinine 0.44 - 1.00 mg/dL 0.97  0.98  1.05   Sodium 135 - 145 mmol/L 132  133  132   Potassium 3.5 - 5.1 mmol/L 4.3  4.3  4.8   Chloride 98 - 111 mmol/L 98  100  99   CO2 22 - 32 mmol/L '26  26  27   '$ Calcium 8.9 - 10.3 mg/dL 8.0  8.3  7.8       Latest Ref Rng & Units 04/09/2022    5:42 AM 04/08/2022   10:28 AM 04/07/2022    6:05 AM  CBC  WBC 4.0 - 10.5 K/uL 8.1  8.7  16.4   Hemoglobin 12.0 - 15.0 g/dL 7.4  7.7  7.8   Hematocrit 36.0 - 46.0 % 22.9  24.1  23.5   Platelets 150 - 400 K/uL 370  412  356       GENERAL: Chronically ill-appearing elderly African-American female, in NAD  SKIN: skin color, texture, turgor are normal, no rashes or significant lesions EYES: conjunctiva are pink and non-injected, sclera clear LUNGS: clear to auscultation and percussion with normal breathing effort HEART: regular rate & rhythm and no murmurs and no lower extremity edema Musculoskeletal: no cyanosis of digits and no clubbing  PSYCH: alert & oriented x 3, fluent speech NEURO: no focal motor/sensory  deficits  Assessment/Plan: Mrs. Metzli Pollick is a 66 year old female with medical history significant for 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  Previously there was a Company secretary discussion with palliative care, and surgical services.  Oncology was involved but not able to be present in person.  Based on the discussions a venting G-tube has been recommended by the surgical services. This was performed on 04/08/2022.  Surgical relief of the obstruction is not recommended at this time. Because a venting G-tube was placed the patient will need nutrition with TPN and continued strong pain control.  Additionally this is only beneficial if the immunotherapy has a pronounced effect on her tumor.  We discussed risks and benefits of strategies moving forward.   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  Next treatment will be due on 04/22/2022. --No evidence of obstructive  stent, creatinine improving with hydration --Small bowel obstruction secondary to pelvic mass, surgery currently following and evaluating.  --venting G-tube by surgery on Friday  --Agree with pain control per palliative care team with fentanyl patches 25 mcg every 72 hours with as needed fentanyl 25 mcg every 4 hours as needed and Dilaudid 1 mg every 3 hours as needed.   --Appreciate the assistance of palliative care regarding discussions and supportive treatment moving forward. --Oncology service will continue to follow.   Ledell Peoples, MD Department of Hematology/Oncology [place] [facility] Phone: [phone] Pager: [phone] Email:  [email or web address]

## 2022-04-09 NOTE — Progress Notes (Signed)
Palliative Care Progress Note  Pain is much improved and Mariah Schultz is more engaged and optimistic about her future. Today she even smiled and showed her fun and playful personality. Mariah Schultz around new PEG site. She says she is feeling very hungry. Output is much less from gastric tube. She met with Dr. Lorenso Courier yesterday afternoon-they still want immunotherapy options. There was preliminary discussion about TPN short term for at least one more Mariah Schultz treatment in 3 weeks. Open surgery has been removed as an option at the present time.   Recommendations:  PCA has been extremely effective-given limited GI route options may need to consider ongoing PCA with CADD pump at home. Deferring TPN decision for today- she wants to give it more time and see if she can tolerate liquids and longer periods of clamping her tube. Increase Octreotide to 2mcg MBO Continue Steroids for now Ordered liquid diet PEG to intermittent suction only not continuous  Mariah Hacker, DO Palliative Medicine   Time: 50 minutes

## 2022-04-09 NOTE — Progress Notes (Signed)
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 Patient is being admitted from cancer center for weakness, dehydration and AKI per Dr. Libby Maw request.  She was started on Bactrim outpatient 3 days prior to admission with concerns of UTI.  Hospital course complicated by SBO possibly from pelvic mass.  General surgery is following.  Status post PEG tube placement 8/4 now to [facility]. Pain management and long  term nutrition on going discussions.    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  Concerned secondary to pelvic mass.  General surgery following.  Extensive surgery is currently not a viable option, overall very poor surgical options and outcome if they were to proceed with extensive surgery with further diminishing her quality of life.  G-tube placed 8/4, will place her on low wall suction Long-term nutrition needs to be addressed.  I have discussed this with oncology and  palliative who plans on discussing this with the patient Pain secondary to her malignancy. PO liquid started, hopefuly her ostomy starts working, if not we are heading towards tpn.   Discussed with Dr Lorenso Courier in person.   Sepsis secondary to UTI recent pyuria with hematuria: POA -Sepsis physiology evidenced by fever, leukocytosis.  Hold off on Bactrim.  Cultures remain negative.  Completed 5 days of IV Rocephin.  Anemia of chronic disease:  -Currently hemoglobin appears to be stable around 9.0.  Drifting down slowly, no obvious evidence of bleeding.  We will discuss the need for transfusion prn.   Poorly differentiated high-grade SCC of unclear source.  Chronic pain - Pelvic mass has caused ureteral obstruction status post stent in place.  Pelvic mass is also now causing malignant bowel obstruction. Seen by Dr Lorenso Courier.  -Palliative care managing pain control.  AKI on CKD-3A/azotemia:  Resolved  -Suspect from dehydration.  Admission creatinine 2.26.  Creatinine now at baseline of 1.0.  Has right-sided stent placed by urology on 7/18 for obstructions likely secondary to mass.  Renal ultrasound is negative.   Hyponatremia/dehydration:  improved   Essential hypertension: -IV as needed ordered   Diverting colostomy/constipation -Bowel regimen.  Wound care team consulted.   Overall Poor Prognosis. Palliative care team following.  Foley in place. Limited mobility for now, once better we can try voiding trial as well.   DVT prophylaxis: SQ heparin. Code Status: Full code Family Communication: Family at bedside.   Ongoing evaluation for abdominal distention.  Ongoing pain management and addressing nutritional needs    Subjective: Abd is still distended, started clears yesterday. Tolerated some broth.  Examination: Constitutional: Not in acute distress. Chronic illness.  Respiratory: Clear to auscultation bilaterally Cardiovascular: Normal sinus rhythm, no rubs Abdomen: Nontender  but distended.  Musculoskeletal: No edema noted Skin: No rashes seen Neurologic: CN 2-12 grossly intact.  And nonfocal Psychiatric: Normal judgment and insight. Alert and oriented x 3. Normal mood.    Ostomy in place PEG tube in place Objective: Vitals:   04/08/22 2150 04/09/22 0004 04/09/22 0612 04/09/22 0627  BP: (!) 170/84  (!) 145/72   Pulse: 65  61   Resp:  '15 12 10  '$ Temp: 98 F (36.7 C)  98.1 F (36.7 C)   TempSrc: Oral  Oral   SpO2: 100%  98%   Weight:      Height:        Intake/Output Summary (Last 24 hours) at 04/09/2022 0735 Last data filed at 04/09/2022 0054 Gross per 24 hour  Intake 1831.77 ml  Output 1150 ml  Net 681.77 ml   Filed Weights   04/03/22 2024  Weight: 76.5 kg     Data Reviewed:   CBC: Recent Labs  Lab 04/05/22 0615 04/06/22 0517 04/07/22 0605 04/08/22 1028 04/09/22 0542  WBC 13.2* 16.8* 16.4* 8.7 8.1  HGB 8.6* 8.3* 7.8* 7.7* 7.4*  HCT 25.9* 24.9* 23.5* 24.1* 22.9*  MCV 90.2 90.5 91.8 94.5 94.2  PLT 361 376 356 412* 027   Basic Metabolic Panel: Recent Labs  Lab 04/05/22 0615 04/06/22 0517 04/07/22 0605 04/08/22 1028 04/09/22 0542  NA 133* 134* 132* 133* 132*  K 3.4* 3.3* 4.8 4.3 4.3  CL 98 97* 99 100 98  CO2 '27 29 27 26 26  '$ GLUCOSE 137* 154* 186* 147* 153*  BUN '14 14 16 15 15  '$ CREATININE 1.05* 0.95 1.05* 0.98 0.97  CALCIUM 8.3* 7.9* 7.8* 8.3* 8.0*  MG 1.8 1.6* 2.6* 2.3 2.0   GFR: Estimated Creatinine Clearance: 56.3 mL/min (by C-G formula based on SCr of 0.97 mg/dL). Liver Function Tests: No results for input(s): "AST", "ALT", "ALKPHOS", "BILITOT", "PROT", "ALBUMIN" in the last 168 hours.  No results for input(s): "LIPASE", "AMYLASE" in the last 168 hours. No results for input(s): "AMMONIA" in the last 168 hours. Coagulation Profile: Recent Labs  Lab 04/08/22 1126  INR 1.1   Cardiac Enzymes: No results for input(s): "CKTOTAL", "CKMB", "CKMBINDEX", "TROPONINI" in the last 168 hours.  BNP (last 3 results) No  results for input(s): "PROBNP" in the last 8760 hours. HbA1C: No results for input(s): "HGBA1C" in the last 72 hours. CBG: No results for input(s): "GLUCAP" in the last 168 hours. Lipid Profile: No results for input(s): "CHOL", "HDL", "LDLCALC", "TRIG", "CHOLHDL", "LDLDIRECT" in the last 72 hours. Thyroid Function Tests: No results for input(s): "TSH", "T4TOTAL", "FREET4", "T3FREE", "THYROIDAB" in the last 72 hours. Anemia Panel: No results for input(s): "VITAMINB12", "FOLATE", "FERRITIN", "TIBC", "IRON", "RETICCTPCT" in the last 72 hours. Sepsis Labs: No results for input(s): "PROCALCITON", "LATICACIDVEN" in the last 168 hours.  Recent Results (from the past 240 hour(s))  Urine Culture     Status: None   Collection Time: 04/02/22  4:50 AM   Specimen: Urine, Clean Catch  Result Value Ref Range Status   Specimen Description   Final    URINE, CLEAN CATCH Performed at [facility], [place] [address] [place], [place] [address]    Special Requests   Final    NONE Performed at [facility], [place] [address], [place], [place] [address]    Culture   Final  NO GROWTH Performed at [facility], [place] [address], [place], [place] [address]    Report Status 04/03/2022 FINAL  Final         Radiology Studies: IR GASTROSTOMY TUBE MOD SED  Result Date: 04/08/2022 INDICATION: 66 year old with squamous cell carcinoma in the pelvis and bowel obstruction. Patient is not a good surgical candidate and plan for a venting gastrostomy tube. EXAM: PERCUTANEOUS GASTROSTOMY TUBE WITH FLUOROSCOPIC GUIDANCE Physician: Stephan Minister. Anselm Pancoast, MD MEDICATIONS: Angiogram ANESTHESIA/SEDATION: Moderate (conscious) sedation was employed during this procedure. A total of Versed 2.'0mg'$  and fentanyl 100 mcg was administered intravenously at the order of the provider performing the procedure. Total intra-service moderate sedation time: 20 minutes. Patient's level of consciousness and  vital signs were monitored continuously by radiology nurse throughout the procedure under the supervision of the provider performing the procedure. FLUOROSCOPY: Radiation Exposure Index (as provided by the fluoroscopic device): 989 mGy Kerma COMPLICATIONS: None immediate. PROCEDURE: The procedure was explained to the patient. The risks and benefits of the procedure were discussed and the patient's questions were addressed. Informed consent was obtained from the patient. The patient was placed on the interventional table. An orogastric tube was placed with fluoroscopic guidance. The anterior abdomen was prepped and draped in sterile fashion. Maximal barrier sterile technique was utilized including caps, mask, sterile gowns, sterile gloves, sterile drape, hand hygiene and skin antiseptic. Stomach was inflated with air through the orogastric tube. The skin and subcutaneous tissues were anesthetized with 1% lidocaine. A 17 gauge needle was directed into the distended stomach with fluoroscopic guidance. A wire was advanced into the stomach and a T-tact was deployed. A 9-French vascular sheath was placed and the orogastric tube was snared using a Gooseneck snare device. The orogastric tube and snare were pulled out of the patient's mouth. The snare device was connected to a 20-French gastrostomy tube. The snare device and gastrostomy tube were pulled through the patient's mouth and out the anterior abdominal wall. The gastrostomy tube was cut to an appropriate length. Contrast injection through gastrostomy tube confirmed placement within the stomach. Fluoroscopic images were obtained for documentation. The gastrostomy tube was flushed with normal saline. IMPRESSION: Successful fluoroscopic guided percutaneous gastrostomy tube placement. Electronically Signed   By: Markus Daft M.D.   On: 04/08/2022 11:54   DG Abd 1 View  Result Date: 04/07/2022 CLINICAL DATA:  Abdominal distension. EXAM: ABDOMEN - 1 VIEW COMPARISON:   Prior radiographs 04/04/2022 and 04/05/2022. Prior CT scan 04/04/2022 FINDINGS: The NG tube is been removed. The right sided double-J ureteral catheter remains in good position. Persistent air distended bowel. No obvious free air. Patient has a left lower quadrant colostomy. IMPRESSION: 1. Persistent air distended bowel. 2. Removal of NG tube. Electronically Signed   By: Marijo Sanes M.D.   On: 04/07/2022 08:28        Scheduled Meds:  budesonide (PULMICORT) nebulizer solution  0.5 mg Nebulization BID   Chlorhexidine Gluconate Cloth  6 each Topical Daily   dexamethasone (DECADRON) injection  4 mg Intravenous Q24H   fentaNYL   Intravenous Q4H   heparin injection (subcutaneous)  5,000 Units Subcutaneous Q8H   metoCLOPramide (REGLAN) injection  5 mg Intravenous Q8H   mouth rinse  15 mL Mouth Rinse 4 times per day   Continuous Infusions:  dextrose 5 % and 0.45% NaCl 100 mL/hr at 04/09/22 0054   octreotide (SANDOSTATIN) 500 mcg in sodium chloride 0.9 % 250 mL (2 mcg/mL) infusion 75 mcg/hr (04/08/22 2231)  LOS: 7 days   Time spent= 35 mins    Stephanie Mcglone Arsenio Loader, MD [facility]  If 7PM-7AM, please contact night-coverage  04/09/2022, 7:35 AM

## 2022-04-09 NOTE — Plan of Care (Signed)
Pain controlled with PCA.  Bowel sounds absent.  Problem: Nutrition: Goal: Adequate nutrition will be maintained Outcome: Not Progressing   Problem: Pain Managment: Goal: General experience of comfort will improve Outcome: Not Progressing

## 2022-04-09 NOTE — Progress Notes (Signed)
Patient ID: Ricketta Colantonio, female   DOB: 1956-07-24, 66 y.o.   MRN: [id number] Pt drowsy but arousable; has some soreness at G tube site as expected; family in room; afebrile; WBC nl; hgb 7.4(7.7); creat nl; abd still dist; G tube intact, to suction; no sig drainage at insertion site , mild- mod tender to palpation; keep outer disc close to skin to prevent any leakage; keep tube to low INTERMITTENT wall suction; further plans as per primary team

## 2022-04-10 DIAGNOSIS — N179 Acute kidney failure, unspecified: Secondary | ICD-10-CM | POA: Diagnosis not present

## 2022-04-10 NOTE — Progress Notes (Signed)
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 Patient is being admitted from cancer center for weakness, dehydration and AKI per Dr. Libby Maw request.  She was started on Bactrim outpatient 3 days prior to admission with concerns of UTI.  Hospital course complicated by SBO possibly from pelvic mass.  General surgery is following.  Status post PEG tube placement 8/4 now to [facility]. Pain management and long  term nutrition on going discussions.    Assessment & Plan:  Principal Problem:   AKI (acute kidney injury) ([place]) Active Problems:   Essential hypertension   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 06/22/2021   Colostomy present ([place])   Hematuria   Hydronephrosis, right   Cancer related pain   Dehydration   Hyponatremia   Pyuria   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   Generalized weakness    Abdominal distention with small bowel obstruction Severe Pelvic pain - CT is suggestive of small bowel obstruction, likely malignant.  Concerned secondary to pelvic mass.  Extensive surgery options were discussed, eventually NG tube was placed to low wall suction. - Currently palliative care team addressing her pain - Long-term nutrition being addressed.  Currently hoping her ostomy may start working.  Clear liquid diet.   Sepsis secondary to UTI recent pyuria with hematuria: POA - Resolved completed 5 days of IV  Rocephin  Anemia of chronic disease:  -Currently hemoglobin appears to be stable around 9.0.  Hemoglobin slowly drifting down, will discuss as needed transfusion  Poorly differentiated high-grade SCC of unclear source.  Chronic pain - Pelvic mass has caused ureteral obstruction status post stent in place.  Pelvic mass is also now causing malignant bowel obstruction. Seen by Dr Lorenso Courier.  -Palliative care managing pain control.  AKI on CKD-3A/azotemia:  Resolved  Status post right-sided stent placement by urology 7/18. -Renal function now back to baseline at 0.9   Hyponatremia/dehydration:  improved   Essential hypertension: -IV as needed ordered   Diverting colostomy/constipation -Bowel regimen.  Wound care team consulted.   Overall very poor prognosis. Foley in place. Limited mobility for now, once better we can try voiding trial as well.   DVT prophylaxis: SQ heparin. Code Status: Full code Family Communication: Family at bedside.   Ongoing evaluation for abdominal distention.  Ongoing pain management and addressing nutritional needs    Subjective: Seen and examined at bedside, minimal output from her ostomy.  Still has abdominal discomfort and distention.  All the questions answered by me.  Examination: Constitutional: Mild distress due to pain.  Currently on PCA pump. Respiratory: Clear to auscultation bilaterally Cardiovascular: Normal sinus rhythm, no rubs Abdomen: Abdomen is distended, slightly tender to deep palpation. Musculoskeletal: No edema noted Skin: No rashes seen Neurologic: CN 2-12 grossly intact.  And nonfocal Psychiatric: Normal judgment and insight. Alert and oriented x 3. Normal mood. Ostomy in place PEG tube in place Objective: Vitals:   04/10/22 0008 04/10/22 0030 04/10/22 0437 04/10/22 0549  BP:    (!) 148/65  Pulse:  66  Resp: '16 18 18 18  '$ Temp:    98.7 F (37.1 C)  TempSrc:    Oral  SpO2: 96% 96% 99% 100%  Weight:      Height:         Intake/Output Summary (Last 24 hours) at 04/10/2022 0802 Last data filed at 04/10/2022 0226 Gross per 24 hour  Intake 4463.18 ml  Output 2300 ml  Net 2163.18 ml   Filed Weights   04/03/22 2024  Weight: 76.5 kg     Data Reviewed:   CBC: Recent Labs  Lab 04/05/22 0615 04/06/22 0517 04/07/22 0605 04/08/22 1028 04/09/22 0542  WBC 13.2* 16.8* 16.4* 8.7 8.1  HGB 8.6* 8.3* 7.8* 7.7* 7.4*  HCT 25.9* 24.9* 23.5* 24.1* 22.9*  MCV 90.2 90.5 91.8 94.5 94.2  PLT 361 376 356 412* 433   Basic Metabolic Panel: Recent Labs  Lab 04/05/22 0615 04/06/22 0517 04/07/22 0605 04/08/22 1028 04/09/22 0542  NA 133* 134* 132* 133* 132*  K 3.4* 3.3* 4.8 4.3 4.3  CL 98 97* 99 100 98  CO2 '27 29 27 26 26  '$ GLUCOSE 137* 154* 186* 147* 153*  BUN '14 14 16 15 15  '$ CREATININE 1.05* 0.95 1.05* 0.98 0.97  CALCIUM 8.3* 7.9* 7.8* 8.3* 8.0*  MG 1.8 1.6* 2.6* 2.3 2.0   GFR: Estimated Creatinine Clearance: 56.3 mL/min (by C-G formula based on SCr of 0.97 mg/dL). Liver Function Tests: No results for input(s): "AST", "ALT", "ALKPHOS", "BILITOT", "PROT", "ALBUMIN" in the last 168 hours.  No results for input(s): "LIPASE", "AMYLASE" in the last 168 hours. No results for input(s): "AMMONIA" in the last 168 hours. Coagulation Profile: Recent Labs  Lab 04/08/22 1126  INR 1.1   Cardiac Enzymes: No results for input(s): "CKTOTAL", "CKMB", "CKMBINDEX", "TROPONINI" in the last 168 hours.  BNP (last 3 results) No results for input(s): "PROBNP" in the last 8760 hours. HbA1C: No results for input(s): "HGBA1C" in the last 72 hours. CBG: No results for input(s): "GLUCAP" in the last 168 hours. Lipid Profile: No results for input(s): "CHOL", "HDL", "LDLCALC", "TRIG", "CHOLHDL", "LDLDIRECT" in the last 72 hours. Thyroid Function Tests: No results for input(s): "TSH", "T4TOTAL", "FREET4", "T3FREE", "THYROIDAB" in the last 72 hours. Anemia Panel: No results for input(s): "VITAMINB12", "FOLATE", "FERRITIN",  "TIBC", "IRON", "RETICCTPCT" in the last 72 hours. Sepsis Labs: No results for input(s): "PROCALCITON", "LATICACIDVEN" in the last 168 hours.  Recent Results (from the past 240 hour(s))  Urine Culture     Status: None   Collection Time: 04/02/22  4:50 AM   Specimen: Urine, Clean Catch  Result Value Ref Range Status   Specimen Description   Final    URINE, CLEAN CATCH Performed at [facility], [place] [address] [place], [place] [address]    Special Requests   Final    NONE Performed at [facility], [place] [address] [place], [place] [address]    Culture   Final    NO GROWTH Performed at [facility], [place] [address] [place], [place] [address]    Report Status 04/03/2022 FINAL  Final         Radiology Studies: IR GASTROSTOMY TUBE MOD SED  Result Date: 04/08/2022 INDICATION: 66 year old with squamous cell carcinoma in the pelvis and bowel obstruction. Patient is not a good surgical candidate and plan for a venting gastrostomy tube. EXAM: PERCUTANEOUS GASTROSTOMY TUBE WITH FLUOROSCOPIC GUIDANCE Physician: Stephan Minister. Anselm Pancoast, MD MEDICATIONS: Angiogram ANESTHESIA/SEDATION: Moderate (conscious) sedation was employed  during this procedure. A total of Versed 2.'0mg'$  and fentanyl 100 mcg was administered intravenously at the order of the provider performing the procedure. Total intra-service moderate sedation time: 20 minutes. Patient's level of consciousness and vital signs were monitored continuously by radiology nurse throughout the procedure under the supervision of the provider performing the procedure. FLUOROSCOPY: Radiation Exposure Index (as provided by the fluoroscopic device): 240 mGy Kerma COMPLICATIONS: None immediate. PROCEDURE: The procedure was explained to the patient. The risks and benefits of the procedure were discussed and the patient's questions were addressed. Informed consent was obtained from the patient. The patient was placed on the  interventional table. An orogastric tube was placed with fluoroscopic guidance. The anterior abdomen was prepped and draped in sterile fashion. Maximal barrier sterile technique was utilized including caps, mask, sterile gowns, sterile gloves, sterile drape, hand hygiene and skin antiseptic. Stomach was inflated with air through the orogastric tube. The skin and subcutaneous tissues were anesthetized with 1% lidocaine. A 17 gauge needle was directed into the distended stomach with fluoroscopic guidance. A wire was advanced into the stomach and a T-tact was deployed. A 9-French vascular sheath was placed and the orogastric tube was snared using a Gooseneck snare device. The orogastric tube and snare were pulled out of the patient's mouth. The snare device was connected to a 20-French gastrostomy tube. The snare device and gastrostomy tube were pulled through the patient's mouth and out the anterior abdominal wall. The gastrostomy tube was cut to an appropriate length. Contrast injection through gastrostomy tube confirmed placement within the stomach. Fluoroscopic images were obtained for documentation. The gastrostomy tube was flushed with normal saline. IMPRESSION: Successful fluoroscopic guided percutaneous gastrostomy tube placement. Electronically Signed   By: Markus Daft M.D.   On: 04/08/2022 11:54        Scheduled Meds:  budesonide (PULMICORT) nebulizer solution  0.5 mg Nebulization BID   Chlorhexidine Gluconate Cloth  6 each Topical Daily   dexamethasone (DECADRON) injection  4 mg Intravenous Q24H   fentaNYL   Intravenous Q4H   heparin injection (subcutaneous)  5,000 Units Subcutaneous Q8H   metoCLOPramide (REGLAN) injection  5 mg Intravenous Q8H   mouth rinse  15 mL Mouth Rinse 4 times per day   Continuous Infusions:  dextrose 5 % and 0.45% NaCl 100 mL/hr at 04/10/22 0226   octreotide (SANDOSTATIN) 500 mcg in sodium chloride 0.9 % 250 mL (2 mcg/mL) infusion 75 mcg/hr (04/10/22 0401)      LOS: 8 days   Time spent= 35 mins    Vimal Derego Arsenio Loader, MD [facility]  If 7PM-7AM, please contact night-coverage  04/10/2022, 8:02 AM

## 2022-04-11 DIAGNOSIS — N179 Acute kidney failure, unspecified: Secondary | ICD-10-CM | POA: Diagnosis not present

## 2022-04-11 LAB — PREPARE RBC (CROSSMATCH)

## 2022-04-11 MED ORDER — FENTANYL 75 MCG/HR TD PT72
1.0000 | MEDICATED_PATCH | TRANSDERMAL | Status: DC
  Administered 2022-04-11: 1 via TRANSDERMAL
  Filled 2022-04-11: qty 1

## 2022-04-11 MED ORDER — SODIUM CHLORIDE 0.9% IV SOLUTION: Freq: Once | INTRAVENOUS | Status: AC

## 2022-04-11 MED ORDER — BOOST / RESOURCE BREEZE PO LIQD CUSTOM
1.0000 | Freq: Three times a day (TID) | ORAL | Status: DC
  Administered 2022-04-11 – 2022-04-19 (×21): 1 via ORAL

## 2022-04-11 MED ORDER — FENTANYL 50 MCG/ML IV PCA SOLN
INTRAVENOUS | Status: DC
  Administered 2022-04-11: 600 ug via INTRAVENOUS
  Administered 2022-04-11: 450 ug via INTRAVENOUS
  Administered 2022-04-12: 100 ug via INTRAVENOUS
  Administered 2022-04-12: 550 ug via INTRAVENOUS
  Filled 2022-04-11 (×3): qty 25

## 2022-04-11 NOTE — Progress Notes (Addendum)
Mobility Specialist - Progress Note     04/11/22 1200  Oxygen Therapy  SpO2 100 %  O2 Device Room Air  Mobility  Activity Ambulated with assistance in hallway  Level of Assistance Contact guard assist, steadying assist  Assistive Device Front wheel walker  Distance Ambulated (ft) 50 ft  Activity Response Tolerated well  $Mobility charge 1 Mobility   Pt was agreeable to mobilize and was found in bed. After aiding with lines Pt was able to IND get up to sit on EOB. Used a RW to ambulate in hallway and upon returning to room was left supine on bed with all necessities in reach and family in room. RN was notified of session.   Ferd Hibbs Mobility Specialist

## 2022-04-11 NOTE — Progress Notes (Signed)
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 Patient is being admitted from cancer center for weakness, dehydration and AKI per Dr. Libby Maw request.  She was started on Bactrim outpatient 3 days prior to admission with concerns of UTI.  Hospital course complicated by SBO possibly from pelvic mass.  General surgery is following.  Status post PEG tube placement 8/4 now to [facility]. Pain management and long  term nutrition on going discussions.    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  Currently hoping her ostomy may start working.  Clear liquid diet. G tube to gravity.    Anemia of chronic disease:  -Currently hemoglobin appears to be stable around 9.0.  Hemoglobin slowly drifting down, ptn and family  ok with 1U PRBC   Sepsis secondary to UTI recent pyuria with hematuria: POA - Resolved completed 5 days of IV Rocephin  Poorly differentiated high-grade SCC of unclear source.  Chronic pain - Pelvic mass has caused ureteral obstruction status post stent in place.  Pelvic mass is also now causing malignant bowel obstruction. Seen by Dr Lorenso Courier.  -Palliative care managing pain control.  AKI on CKD-3A/azotemia:  Resolved  Status post right-sided stent placement by urology 7/18. - Resolved, now at baseline 0.9   Hyponatremia/dehydration:  improved   Essential hypertension: -IV as needed ordered   Diverting colostomy/constipation -Bowel regimen.  Wound care team consulted.   Overall very poor prognosis. Foley in place. Limited mobility for now, once better we can try voiding trial as well.   DVT prophylaxis: SQ heparin. Code Status: Full code Family Communication: Family at bedside.   Ongoing evaluation for abdominal distention.  Ongoing pain management and addressing nutritional needs    Subjective: Abd is till distended. Overall poor funx status.   Examination: Constitutional: Not in acute distress Respiratory: Clear to auscultation bilaterally Cardiovascular: Normal sinus rhythm, no rubs Abdomen: Nontender +distended good bowel sounds Musculoskeletal: No edema noted Skin: No rashes seen Neurologic: CN 2-12 grossly intact.  And nonfocal Psychiatric: Normal judgment and insight. Alert and oriented x 3. Normal mood.   Ostomy in place PEG tube in place Objective: Vitals:   04/11/22 0015 04/11/22 0220 04/11/22 0423 04/11/22 0527  BP:    (!) 142/73  Pulse:    65  Resp: '16 16 18 18  '$ Temp:    98.3 F (36.8 C)  TempSrc:    Oral  SpO2: 99% 100% 100% 99%  Weight:      Height:        Intake/Output Summary (Last 24 hours) at 04/11/2022 0752 Last data filed at 04/11/2022 0500 Gross per 24 hour  Intake 120 ml  Output 2450 ml  Net -2330 ml   Filed Weights   04/03/22  2024  Weight: 76.5 kg     Data Reviewed:   CBC: Recent Labs  Lab 04/05/22 0615 04/06/22 0517 04/07/22 0605 04/08/22 1028 04/09/22 0542  WBC 13.2* 16.8* 16.4* 8.7 8.1  HGB 8.6* 8.3* 7.8* 7.7* 7.4*  HCT 25.9* 24.9* 23.5* 24.1* 22.9*  MCV 90.2 90.5 91.8 94.5 94.2  PLT 361 376 356 412* 474   Basic Metabolic Panel: Recent Labs  Lab 04/05/22 0615 04/06/22 0517 04/07/22 0605 04/08/22 1028 04/09/22 0542  NA 133* 134* 132* 133* 132*  K 3.4* 3.3* 4.8 4.3 4.3  CL 98 97* 99 100 98  CO2 '27 29 27 26 26  '$ GLUCOSE 137* 154* 186* 147* 153*  BUN '14 14 16 15 15  '$ CREATININE 1.05* 0.95 1.05* 0.98 0.97  CALCIUM 8.3* 7.9* 7.8* 8.3* 8.0*  MG 1.8 1.6* 2.6* 2.3 2.0   GFR: Estimated Creatinine Clearance: 56.3 mL/min (by C-G formula based on SCr of 0.97 mg/dL). Liver Function Tests: No results for input(s): "AST", "ALT", "ALKPHOS", "BILITOT", "PROT", "ALBUMIN" in the last 168 hours.  No results for input(s): "LIPASE", "AMYLASE" in the last 168 hours. No results for input(s): "AMMONIA" in the last 168 hours. Coagulation Profile: Recent Labs  Lab 04/08/22 1126  INR 1.1   Cardiac Enzymes: No results for input(s): "CKTOTAL", "CKMB", "CKMBINDEX", "TROPONINI" in the last 168 hours.  BNP (last 3 results) No results for input(s): "PROBNP" in the last 8760 hours. HbA1C: No results for input(s): "HGBA1C" in the last 72 hours. CBG: No results for input(s): "GLUCAP" in the last 168 hours. Lipid Profile: No results for input(s): "CHOL", "HDL", "LDLCALC", "TRIG", "CHOLHDL", "LDLDIRECT" in the last 72 hours. Thyroid Function Tests: No results for input(s): "TSH", "T4TOTAL", "FREET4", "T3FREE", "THYROIDAB" in the last 72 hours. Anemia Panel: No results for input(s): "VITAMINB12", "FOLATE", "FERRITIN", "TIBC", "IRON", "RETICCTPCT" in the last 72 hours. Sepsis Labs: No results for input(s): "PROCALCITON", "LATICACIDVEN" in the last 168 hours.  Recent Results (from the past 240 hour(s))  Urine  Culture     Status: None   Collection Time: 04/02/22  4:50 AM   Specimen: Urine, Clean Catch  Result Value Ref Range Status   Specimen Description   Final    URINE, CLEAN CATCH Performed at [facility] [place] [address] [place], [place] [address]    Special Requests   Final    NONE Performed at [facility], [place] [address], [place], [place] [address]    Culture   Final    NO GROWTH Performed at [facility], [place] [address] [place], [place] [address]    Report Status 04/03/2022 FINAL  Final         Radiology Studies: No results found.      Scheduled Meds:  budesonide (PULMICORT) nebulizer solution  0.5 mg Nebulization BID   Chlorhexidine Gluconate Cloth  6 each Topical Daily   dexamethasone (DECADRON) injection  4 mg Intravenous Q24H   fentaNYL   Intravenous Q4H   heparin injection (subcutaneous)  5,000 Units Subcutaneous Q8H   metoCLOPramide (REGLAN) injection  5 mg Intravenous Q8H   mouth rinse  15 mL Mouth Rinse 4 times  per day   Continuous Infusions:  dextrose 5 % and 0.45% NaCl 100 mL/hr at 04/11/22 0121   octreotide (SANDOSTATIN) 500 mcg in sodium chloride 0.9 % 250 mL (2 mcg/mL) infusion 75 mcg/hr (04/11/22 0210)     LOS: 9 days   Time spent= 35 mins    Solara Goodchild Arsenio Loader, MD [facility]  If 7PM-7AM, please contact night-coverage  04/11/2022, 7:52 AM

## 2022-04-11 NOTE — Care Management Important Message (Signed)
Important Message  Patient Details IM Letter given to the Patient. Name: Mariah Schultz MRN: [id number] Date of Birth: 09-14-1955   Medicare Important Message Given:  Yes     Kerin Salen 04/11/2022, 10:09 AM

## 2022-04-11 NOTE — TOC Progression Note (Signed)
Transition of Care [facility]) - Progression Note    Patient Details  Name: Mariah Schultz MRN: [id number] Date of Birth: [birth year]  Transition of Care [facility]) CM/SW Contact  Ross Ludwig, [place] Phone Number: 04/11/2022, 4:34 PM  Clinical Narrative:     CSW was informed that patient will need TPN and also PCA pump for discharge.  CSW contacted Pam at [employer], she will continue to follow patient while she is in hospital.  Jeannene Patella will also work on cordinating with pharmacy on propper TPN mix.  Palliative is also following patient for pain management.       Expected Discharge Plan and [place] with home health                                             Social Determinants of Health (SDOH) Interventions    Readmission Risk Interventions    04/03/2022    5:52 PM 04/03/2022    4:06 PM  Readmission Risk Prevention Plan  Transportation Screening Complete Complete  PCP or Specialist Appt within 5-7 Days Complete Complete  Home Care Screening Complete Complete  Medication Review (RN CM) Referral to Pharmacy Referral to Pharmacy

## 2022-04-12 ENCOUNTER — Inpatient Hospital Stay: Payer: Self-pay

## 2022-04-12 ENCOUNTER — Other Ambulatory Visit: Payer: Self-pay | Admitting: Internal Medicine

## 2022-04-12 DIAGNOSIS — Z515 Encounter for palliative care: Secondary | ICD-10-CM

## 2022-04-12 DIAGNOSIS — N179 Acute kidney failure, unspecified: Secondary | ICD-10-CM | POA: Diagnosis not present

## 2022-04-12 LAB — TYPE AND SCREEN
ABO/RH(D): A POS
Antibody Screen: NEGATIVE
Unit division: 0

## 2022-04-12 LAB — BPAM RBC
Blood Product Expiration Date: 202308142359
ISSUE DATE / TIME: 202308071820
Unit Type and Rh: 6200

## 2022-04-12 MED ORDER — FENTANYL 50 MCG/ML IV PCA SOLN
INTRAVENOUS | Status: DC
  Administered 2022-04-12: 50 ug via INTRAVENOUS
  Administered 2022-04-12: 1100 ug via INTRAVENOUS
  Administered 2022-04-13: 900 ug/h via INTRAVENOUS
  Administered 2022-04-13: 632.7 ug via INTRAVENOUS
  Filled 2022-04-12 (×4): qty 25

## 2022-04-12 MED ORDER — FENTANYL 50 MCG/ML IV PCA SOLN: INTRAVENOUS | 0 refills | Status: DC

## 2022-04-12 MED ORDER — METOCLOPRAMIDE HCL 5 MG PO TABS
5.0000 mg | ORAL_TABLET | Freq: Three times a day (TID) | ORAL | Status: DC
  Administered 2022-04-12 – 2022-04-19 (×22): 5 mg via ORAL
  Filled 2022-04-12 (×22): qty 1

## 2022-04-12 MED ORDER — DEXAMETHASONE 4 MG PO TABS
4.0000 mg | ORAL_TABLET | Freq: Every day | ORAL | Status: DC
  Administered 2022-04-12 – 2022-04-19 (×8): 4 mg via ORAL
  Filled 2022-04-12 (×8): qty 1

## 2022-04-12 MED ORDER — FENTANYL 50 MCG/ML IV PCA SOLN
INTRAVENOUS | 0 refills | Status: DC
Start: 2022-04-12 — End: 2022-04-12

## 2022-04-12 NOTE — Progress Notes (Signed)
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 Patient is being admitted from cancer center for weakness, dehydration and AKI per Dr. Libby Maw request.  She was started on Bactrim outpatient 3 days prior to admission with concerns of UTI.  Hospital course complicated by SBO possibly from pelvic mass.  General surgery is following.  Status post PEG tube placement 8/4 now to [facility]. Pain management and long  term nutrition on going discussions.  Patient does have Chemo-Port but will plan on placing PICC line to avoid any compatibility issue.   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  Full liquid diet G-tube to gravity.  Hoping ostomy may start working - Palliative care following for pain management.  Started on fentanyl patch but still requiring IV  Dilaudid, on bolus PCA dosing, Reglan and steroids.  Stop octreotide drip upon discharge, will receive octreotide injection outpatient when follows up with pain.  Holding off on TPN, everyone in agreement at this time. - Currently on D5 half-normal saline.    Anemia of chronic disease:  -Currently hemoglobin appears to be stable around 9.0.  Hemoglobin slowly drifting down, status post 1 unit PRBC 8/7  Sepsis secondary to UTI recent pyuria with hematuria: POA - Resolved completed 5 days of IV Rocephin  Poorly differentiated high-grade SCC of unclear source.  Chronic pain - Pelvic mass has caused ureteral obstruction status post stent in place.  Pelvic mass is also now causing malignant bowel obstruction. Seen by Dr Lorenso Courier.  -Palliative care managing pain control.  AKI on CKD-3A/azotemia:  Resolved  Status post right-sided stent placement by urology 7/18.   Hyponatremia/dehydration:  improved   Essential hypertension: -IV as needed ordered   Diverting colostomy/constipation -Bowel regimen.  Wound care team consulted.   Overall very poor prognosis. Foley in place. Limited mobility for now, once better we can try voiding trial as well.   DVT prophylaxis: SQ heparin. Code Status: Full code Family Communication: Family at bedside.   Today transitioning her medications to p.o.  Making sure all the home health equipments and services have been arranged.  Hopefully home in next 24 hours   Subjective: Patient seen and examined at bedside, tells me she had some output in her ostomy bag.  Abdomen feels slightly better but still has pain.  Generally feels very weak.  Examination: Constitutional: Not in acute distress Respiratory: Clear to auscultation bilaterally Cardiovascular: Normal sinus rhythm, no rubs Abdomen:  Abdomen is distended but slightly improved compared to yesterday.  Nontender. Musculoskeletal: No edema noted Skin: No rashes seen Neurologic: CN 2-12 grossly intact.   And nonfocal Psychiatric: Normal judgment and insight. Alert and oriented x 3. Normal mood. Ostomy in place PEG tube in place Objective: Vitals:   04/11/22 2316 04/12/22 0408 04/12/22 0523 04/12/22 0526  BP:   (!) 151/76 (!) 151/76  Pulse:   72 72  Resp: '18 16 16 18  '$ Temp:   98.5 F (36.9 C) 98.5 F (36.9 C)  TempSrc:   Oral Oral  SpO2: 98% 96% 99% 99%  Weight:      Height:        Intake/Output Summary (Last 24 hours) at 04/12/2022 0731 Last data filed at 04/12/2022 5498 Gross per 24 hour  Intake 1519.02 ml  Output 2175 ml  Net -655.98 ml   Filed Weights   04/03/22 2024  Weight: 76.5 kg     Data Reviewed:   CBC: Recent Labs  Lab 04/06/22 0517 04/07/22 0605 04/08/22 1028 04/09/22 0542  WBC 16.8* 16.4* 8.7 8.1  HGB 8.3* 7.8* 7.7* 7.4*  HCT 24.9* 23.5* 24.1* 22.9*  MCV 90.5 91.8 94.5 94.2  PLT 376 356 412* 264   Basic Metabolic Panel: Recent Labs  Lab 04/06/22 0517 04/07/22 0605 04/08/22 1028 04/09/22 0542  NA 134* 132* 133* 132*  K 3.3* 4.8 4.3 4.3  CL 97* 99 100 98  CO2 '29 27 26 26  '$ GLUCOSE 154* 186* 147* 153*  BUN '14 16 15 15  '$ CREATININE 0.95 1.05* 0.98 0.97  CALCIUM 7.9* 7.8* 8.3* 8.0*  MG 1.6* 2.6* 2.3 2.0   GFR: Estimated Creatinine Clearance: 56.3 mL/min (by C-G formula based on SCr of 0.97 mg/dL). Liver Function Tests: No results for input(s): "AST", "ALT", "ALKPHOS", "BILITOT", "PROT", "ALBUMIN" in the last 168 hours.  No results for input(s): "LIPASE", "AMYLASE" in the last 168 hours. No results for input(s): "AMMONIA" in the last 168 hours. Coagulation Profile: Recent Labs  Lab 04/08/22 1126  INR 1.1   Cardiac Enzymes: No results for input(s): "CKTOTAL", "CKMB", "CKMBINDEX", "TROPONINI" in the last 168 hours.  BNP (last 3 results) No results for input(s): "PROBNP" in the last 8760 hours. HbA1C: No results for input(s): "HGBA1C" in the last 72 hours. CBG: No results for input(s): "GLUCAP" in the last 168 hours. Lipid  Profile: No results for input(s): "CHOL", "HDL", "LDLCALC", "TRIG", "CHOLHDL", "LDLDIRECT" in the last 72 hours. Thyroid Function Tests: No results for input(s): "TSH", "T4TOTAL", "FREET4", "T3FREE", "THYROIDAB" in the last 72 hours. Anemia Panel: No results for input(s): "VITAMINB12", "FOLATE", "FERRITIN", "TIBC", "IRON", "RETICCTPCT" in the last 72 hours. Sepsis Labs: No results for input(s): "PROCALCITON", "LATICACIDVEN" in the last 168 hours.  No results found for this or any previous visit (from the past 240 hour(s)).        Radiology Studies: [place] EKG SITE RITE  Result Date: 04/12/2022 If Site Rite image not attached, placement could not be confirmed due to current cardiac rhythm.       Scheduled Meds:  budesonide (PULMICORT) nebulizer solution  0.5 mg Nebulization BID   Chlorhexidine Gluconate Cloth  6 each Topical Daily   dexamethasone (DECADRON) injection  4 mg Intravenous Q24H   feeding supplement  1 Container Oral TID BM   fentaNYL  1 patch Transdermal Q72H   fentaNYL   Intravenous Q4H   heparin injection (subcutaneous)  5,000 Units Subcutaneous Q8H   metoCLOPramide (REGLAN) injection  5 mg Intravenous Q8H  mouth rinse  15 mL Mouth Rinse 4 times per day   Continuous Infusions:  dextrose 5 % and 0.45% NaCl 100 mL/hr at 04/12/22 0635   octreotide (SANDOSTATIN) 500 mcg in sodium chloride 0.9 % 250 mL (2 mcg/mL) infusion 100 mcg/hr (04/12/22 0229)     LOS: 10 days   Time spent= 35 mins    Eliott Amparan Arsenio Loader, MD [facility]  If 7PM-7AM, please contact night-coverage  04/12/2022, 7:31 AM

## 2022-04-12 NOTE — TOC Progression Note (Addendum)
Transition of Care [facility]) - Progression Note    Patient Details  Name: Mariah Schultz MRN: [id number] Date of Birth: 05-11-1956  Transition [facility] Care [facility]) CM/SW Contact  Ross Ludwig, [place] Phone Number: 04/12/2022, 5:37 PM  Clinical Narrative:     CSW spoke to [facility] from [place], she will be able to assist with PCA pump.  CSW tried finding a home health agency who is willing to accept patient for [facility] RN.  CSW spoke to [place] at [place], she spoke to the [facility] who said they can not accept patient.   CSW spoke to [employer] at [facility] and they are unable to accept patient as well due to nursing not being trained on PCA pump.  If patient needs TPN, Pam at [facility] is aware and following patient.   CSW contacted [place] she will contact the [facility] to see if they can accept patient.  Per Pam at [employer], if preferred home health agencies can not accept patient, she will use [facility] to provide the [facility] RN for the PCA pump.  CSW requested bedside nurse to see if patient qualifies for oxygen, bedside nurse walked patient and she did qualify for oxygen.  CSW contacted Zach from [place] regarding oxygen needs.  He will work on getting oxygen for patient tomorrow.  CSW to continue to follow patient's progress throughout discharge planning.     Expected Discharge Plan and [place] with home health RN, PCA pump and oxygen.                                             Social Determinants of Health (SDOH) Interventions    Readmission Risk Interventions    04/03/2022    5:52 PM 04/03/2022    4:06 PM  Readmission Risk Prevention Plan  Transportation Screening Complete Complete  PCP or Specialist Appt within 5-7 Days Complete Complete  Home Care Screening Complete Complete  Medication Review (RN CM) Referral to Pharmacy Referral to Pharmacy

## 2022-04-12 NOTE — Progress Notes (Signed)
SATURATION QUALIFICATIONS: (This note is used to comply with regulatory documentation for home oxygen)  Patient Saturations on Room Air at Rest = 93%  Patient Saturations on Room Air while Ambulating = 87%  Patient Saturations on 2 Liters of oxygen while Ambulating = 93%  Please briefly explain why patient needs home oxygen: Patient o2 saturation at rest after fentanyl dose and drowsy also causes patient to have an 02 saturation of 87% on room air. Ambulation on room air causes saturation to be 87%. Patient tolerating 2LNC with an o2 saturation that maintains 93% or better.

## 2022-04-12 NOTE — Progress Notes (Addendum)
Daily Progress Note   Patient Name: Mariah Schultz       Date: 04/12/2022 DOB: 1955/12/13  Age: 66 y.o. MRN#: [id number] Attending Physician: Damita Lack, MD Primary Care Physician: Seward Carol, MD Admit Date: 04/01/2022  Reason for Consultation/Follow-up: Non pain symptom management and Pain control  Subjective: Patient is resting in bed, and husband is at bedside.  She complains of ongoing abdominal discomfort.  We talked about clamping trials, we talked about oral intake as well as pain management.  Patient and husband are very concerned about how the patient will do once she is discharged.  Call placed and also discussed with patient's daughter with regards to her current symptoms.    Length of Stay: 10  Current Medications: Scheduled Meds:   budesonide (PULMICORT) nebulizer solution  0.5 mg Nebulization BID   Chlorhexidine Gluconate Cloth  6 each Topical Daily   dexamethasone  4 mg Oral Daily   feeding supplement  1 Container Oral TID BM   fentaNYL  1 patch Transdermal Q72H   fentaNYL   Intravenous Q4H   heparin injection (subcutaneous)  5,000 Units Subcutaneous Q8H   metoCLOPramide  5 mg Oral TID AC   mouth rinse  15 mL Mouth Rinse 4 times per day    Continuous Infusions:  dextrose 5 % and 0.45% NaCl 100 mL/hr at 04/12/22 0738   octreotide (SANDOSTATIN) 500 mcg in sodium chloride 0.9 % 250 mL (2 mcg/mL) infusion 100 mcg/hr (04/12/22 1334)    PRN Meds: acetaminophen **OR** acetaminophen, ammonium lactate, dextromethorphan-guaiFENesin, diphenhydrAMINE **OR** diphenhydrAMINE, hydrALAZINE, ipratropium-albuterol, metoprolol tartrate, naloxone **AND** sodium chloride flush, ondansetron **OR** ondansetron (ZOFRAN) IV, phenol, prochlorperazine  Physical Exam          Resting in bed Abdominal distention Regular work of breathing Vital Signs: BP (!) 152/74 (BP Location: Left Arm)   Pulse 70   Temp 98.7 F (37.1 C) (Oral)   Resp 17   Ht '5\' 4"'$  (1.626 m)   Wt 76.5 kg   SpO2 99%   BMI 28.96 kg/m  SpO2: SpO2: 99 % O2 Device: O2 Device: Nasal Cannula O2 Flow Rate: O2 Flow Rate (L/min): 2 L/min  Intake/output summary:  Intake/Output Summary (Last 24 hours) at 04/12/2022 1435 Last data filed at 04/12/2022 1223 Gross per 24 hour  Intake 1879.02 ml  Output  2175 ml  Net -295.98 ml    LBM: Last BM Date : 04/11/22 Baseline Weight: Weight: 76.5 kg Most recent weight: Weight: 76.5 kg       Palliative Assessment/Data:      Patient Active Problem List   Diagnosis Date Noted   Palliative care patient 04/12/2022   Malignant neoplasm of colon ([place]) 04/01/2022   Cancer related pain 04/01/2022   Dehydration 04/01/2022   Hyponatremia 04/01/2022   Pyuria 04/01/2022   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 04/01/2022   Generalized weakness 04/01/2022   AKI (acute kidney injury) ([place]) 03/21/2022   History of radiation therapy    Hematuria    Normocytic anemia    Hydronephrosis, right    Port-A-Cath in place 08/09/2021   Hypokalemia 06/25/2021   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 06/22/2021 06/23/2021   Colostomy present ([place]) 06/23/2021   COVID-19 06/23/2021   Mass of pelvis 06/22/2021   Malignant tumor of pelvis ([place]) 06/22/2021   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 Poorly differentiated high-grade SCC of unclear etiology Has diverting colostomy Palliative medicine team consulted for pain and on pain symptom management  Recommendations/Plan: Adjust fentanyl PCA.  She is on transdermal fentanyl patch.  Okay to give boluses off of the fentanyl  PCA for additional pain management options. Remains on octreotide infusion Monitor oral intake and her symptom control. Hospital course remains guarded, cussed in detail with daughter on the phone today. Full code full scope for now  Patient wishes to get through another [facility] treatment.   Goals of Care and Additional Recommendations: Limitations on Scope of Treatment: Full Scope Treatment  Code Status:    Code Status Orders  (From admission, onward)           Start     Ordered   04/01/22 1753  Full code  Continuous        04/01/22 1754           Code Status History     Date Active Date Inactive Code Status Order ID Comments User Context   03/21/2022 1645 03/25/2022 2329 Full Code [id number]  Elmarie Shiley, MD Inpatient   06/22/2021 1137 06/25/2021 2133 Full Code [id number]  Dorothyann Gibbs, NP Inpatient   09/10/2013 2128 09/11/2013 1824 Full Code [id number]  Shanda Howells, MD ED   02/26/2013 0034 02/27/2013 2014 Full Code [id number]  Harl Bowie, MD Inpatient       Prognosis:  Guarded   Discharge Planning: To Be Determined  Care plan was discussed with IDT  Thank you for allowing the Palliative Medicine Team to assist in the care of this patient.  MOD MDM.   Greater than 50%  of this time was spent counseling and coordinating care related to the above assessment and plan.  Loistine Chance, MD  Please contact Palliative Medicine Team phone at [phone] for questions and concerns.

## 2022-04-12 NOTE — Progress Notes (Signed)
Palliative Care Progress Note  Mrs. Payano is much improved today. Her pain is under excellent control and she was able to walk down the hall today. Her abdomen is not distended and she had a small amount of stool and gas output from her ostomy. She elected for conservative management of her MBO and had a venting PEG placed last week which she has tolerated well. We are beginning to get her ready for discharge. She continues to be hopeful that the [facility] will help. Her next dose is 8/18.   Recommendations:  In preparation for discharge: Will remove her Foley, her stents are working and her urine output has been good -she should be able to urinate on her own. Will remove and monitor for retention. Nutrition is her biggest challenge now. We have discussed TPN but they have been hesitant to start hoping that her bowel obstruction would resolve without aggressive intervention. She responded well to the octreotide and now has some output. She must be absorbing at least some nutrition given her improvement. Clamp PEG tube for 30 min after PO intake, advance to full liquids today. Give Miralax and clamp tube for 30 min daily. Breeze nutritional supplement TID Monitor for refeeding syndrome Goal is to avoid TPN administration if possible -she wants to get through another Keytruda treatment and continue conservative management -if a decision is made to start TPN will need to identify a timeframe for the trial. Would also not recommend starting TPN after discharge-we would need to determine formulation and tolerability. We can see her shortly after her discharge in the Palliative Clinic at [facility] and give her Sandostatin LAR injection. Pain Control Will transition her to a 48mg Fentanyl patch today for basal pain relief based on effective PCA dose.  She will need to go home with her PCA for prn pain control since her GI tract is still unreliable and her current regimen is working very well. Should not need a  basal continuous rate -only a PCA dose.  Can switch her decadron to PO but unsure about full absorption. Will need to clamp PEG after administration.  Patient motivated to go home but will need a very high level of in home support to not be readmitted. Our clinic will try to support as much as possible.  ELane Hacker DO Palliative Medicine

## 2022-04-13 DIAGNOSIS — Z933 Colostomy status: Secondary | ICD-10-CM | POA: Diagnosis not present

## 2022-04-13 DIAGNOSIS — C4492 Squamous cell carcinoma of skin, unspecified: Secondary | ICD-10-CM | POA: Diagnosis not present

## 2022-04-13 DIAGNOSIS — N179 Acute kidney failure, unspecified: Secondary | ICD-10-CM | POA: Diagnosis not present

## 2022-04-13 DIAGNOSIS — K56609 Unspecified intestinal obstruction, unspecified as to partial versus complete obstruction: Secondary | ICD-10-CM | POA: Diagnosis not present

## 2022-04-13 LAB — CBC
HCT: 25.1 % — ABNORMAL LOW (ref 36.0–46.0)
Hemoglobin: 8.2 g/dL — ABNORMAL LOW (ref 12.0–15.0)
MCH: 30.3 pg (ref 26.0–34.0)
MCHC: 32.7 g/dL (ref 30.0–36.0)
MCV: 92.6 fL (ref 80.0–100.0)
Platelets: 279 10*3/uL (ref 150–400)
RBC: 2.71 MIL/uL — ABNORMAL LOW (ref 3.87–5.11)
RDW: 15.2 % (ref 11.5–15.5)
WBC: 4.2 10*3/uL (ref 4.0–10.5)
nRBC: 0 % (ref 0.0–0.2)

## 2022-04-13 LAB — BASIC METABOLIC PANEL
Anion gap: 8 (ref 5–15)
BUN: 14 mg/dL (ref 8–23)
CO2: 30 mmol/L (ref 22–32)
Calcium: 7.9 mg/dL — ABNORMAL LOW (ref 8.9–10.3)
Chloride: 98 mmol/L (ref 98–111)
Creatinine, Ser: 1.01 mg/dL — ABNORMAL HIGH (ref 0.44–1.00)
GFR, Estimated: >60 mL/min (ref >60)
Glucose, Bld: 162 mg/dL — ABNORMAL HIGH (ref 70–99)
Potassium: 3.5 mmol/L (ref 3.5–5.1)
Sodium: 136 mmol/L (ref 135–145)

## 2022-04-13 LAB — MAGNESIUM: Magnesium: 2 mg/dL (ref 1.7–2.4)

## 2022-04-13 MED ORDER — FENTANYL 50 MCG/ML IV PCA SOLN
INTRAVENOUS | Status: DC
  Administered 2022-04-13: 140.7 ug via INTRAVENOUS
  Administered 2022-04-13: 1051 ug/h via INTRAVENOUS
  Administered 2022-04-13: 619 ug via INTRAVENOUS
  Administered 2022-04-14: 725.9 ug via INTRAVENOUS
  Administered 2022-04-14: 733.1 ug via INTRAVENOUS
  Filled 2022-04-13 (×3): qty 25

## 2022-04-13 NOTE — TOC Progression Note (Signed)
Transition of Care [facility]) - Progression Note   Patient Details  Name: Mariah Schultz MRN: [id number] Date of Birth: [birth year]  Transition of Care [facility]) CM/SW [place], LCSW Phone Number: 04/13/2022, 2:11 PM  Clinical Narrative: CSW confirmed with Claiborne Billings with [place] that the referral has been accepted for [facility]. Pam with [employer] to pick up medication prescription for PCA pump. DME order for nebulizer placed. CSW followed up with Andee Poles with [employer] and the oxygen and nebulizer will be delivered to patient's room closer to discharge. TOC to follow.  Expected Discharge Plan: [place] Barriers to Discharge: Continued Medical Work up  Expected Discharge Plan and Services Expected Discharge Plan: [place] In-house Referral: Clinical Social Work Post Acute Care Choice: Home Health            DME Arranged: Oxygen, Nebulizer machine DME Agency: [employer] Date DME Agency Contacted: 04/13/22 Representative spoke with at DME Agency: Andee Poles HH Arranged: RN [place] Agency: [place] Date [place]: 04/13/22 Representative spoke with at [place]: Claiborne Billings  Readmission Risk Interventions    04/03/2022    5:52 PM 04/03/2022    4:06 PM  Readmission Risk Prevention Plan  Transportation Screening Complete Complete  PCP or Specialist Appt within 5-7 Days Complete Complete  Home Care Screening Complete Complete  Medication Review (RN CM) Referral to Pharmacy Referral to Pharmacy

## 2022-04-13 NOTE — Progress Notes (Signed)
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 Patient was admitted from cancer center for weakness, dehydration and AKI per Dr. Libby Maw request.  .  Hospital course complicated by SBO possibly from pelvic mass.  General surgery was following.  Status post venting PEG tube placement 8/4 .  Hospitalist remarkable for persistent pain.  There was ongoing discussion about goals of care and long  term nutrition.  Palliative care closely following.  Plan is to discharge home with home health  with PCA pump when pain is better managed.  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  Also had severe pelvic pain going from pelvic mass.  CT showed small bowel obstruction, likely main obstruction from pelvic mass.  General surgery consulted.  Discussed surgical option but she was not a good candidate.  Status post venting G-tube placed by IR for palliative performance.  Plan is to discharge her with PCA pump. She  previously had diverting colostomy for partial colonic obstruction. Patient is on fentanyl PCA, fentanyl patch, also on octreotide infusion.  TPN held off.On full liquid diet  Squamous cell carcinoma of pelvis of unclear etiology: Poorly differentiated, high-grade carcinoma.  Concern for cervical cancer.  Oncology is following during this hospitalization.  She also has hydronephrosis secondary to pelvic mass. Oncology planning to follow-up her as an outpatient for next treatment with immunotherapy.  Continue pain management, supportive care.  Sepsis secondary to UTI: Sepsis physiology has resolved.  Completed 5 days course of ceftriaxone  Normocytic anemia: Currently hemoglobin stable.  Given a unit of blood transfusion on 8/7.  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  Status post right-sided stent placement by urology on 7/18 for hydronephrosis secondary to pelvic mass  Hypertension: Currently BP stable        DVT prophylaxis:heparin injection 5,000 Units Start: 04/05/22 0845 SCDs Start: 04/01/22 1753     Code Status: Full Code  Family Communication: Husband at bedside on 8/9  Patient status:Inpatient  Patient is from :Home  Anticipated discharge FT:DDUK  Estimated DC date:1-2 days   Consultants: palliative care, general surgery, IR  Procedures: Venting G-tube  Antimicrobials:  Anti-infectives (From admission, onward)    Start     Dose/Rate Route Frequency Ordered Stop   04/08/22 0600  ceFAZolin (ANCEF) IVPB 2g/100 mL premix        2 g 200 mL/hr over 30 Minutes Intravenous To Radiology 04/07/22 1618 04/08/22 0940   04/01/22 1930  cefTRIAXone (ROCEPHIN) 1 g in sodium chloride 0.9 % 100 mL IVPB  1 g 200 mL/hr over 30 Minutes Intravenous Every 24 hours 04/01/22 1830 04/05/22 2100       Subjective:  Patient seen and examined at the bedside this morning.  Hemodynamically stable.  Overall comfortable but very weak and  deconditioned.  Complains of frequent abdominal pain requiring frequent pushing of PCA button.  Husband at bedside and was concerned about discharge plan for tomorrow.  Requesting ostomy nurse to see the colostomy   Objective: Vitals:   04/13/22 0149 04/13/22 0405 04/13/22 0434 04/13/22 0756  BP:   (!) 147/75   Pulse:   61   Resp: '18 18 18   '$ Temp:   98.2 F (36.8 C)   TempSrc:   Oral   SpO2: 95% 94% 100% 96%  Weight:      Height:        Intake/Output Summary (Last 24 hours) at 04/13/2022 1101 Last data filed at 04/13/2022 0500 Gross per 24 hour  Intake 2402.5 ml  Output 2750 ml  Net -347.5 ml   Filed Weights   04/03/22 2024  Weight: 76.5 kg    Examination:  General exam: Overall comfortable, not in distress, very deconditioned, weak, chronically looking HEENT: PERRL Respiratory system:  no wheezes or crackles  Cardiovascular system: S1 & S2 heard, RRR.  Gastrointestinal system: Abdomen is distended, colostomy, G-tube with brown stool Central nervous system: Alert and oriented Extremities: trace lower extremity  edema, no clubbing ,no cyanosis Skin: No rashes, no ulcers,no icterus     Data Reviewed: I have personally reviewed following labs and imaging studies  CBC: Recent Labs  Lab 04/07/22 0605 04/08/22 1028 04/09/22 0542 04/13/22 0448  WBC 16.4* 8.7 8.1 4.2  HGB 7.8* 7.7* 7.4* 8.2*  HCT 23.5* 24.1* 22.9* 25.1*  MCV 91.8 94.5 94.2 92.6  PLT 356 412* 370 470   Basic Metabolic Panel: Recent Labs  Lab 04/07/22 0605 04/08/22 1028 04/09/22 0542 04/13/22 0448  NA 132* 133* 132* 136  K 4.8 4.3 4.3 3.5  CL 99 100 98 98  CO2 '27 26 26 30  '$ GLUCOSE 186* 147* 153* 162*  BUN '16 15 15 14  '$ CREATININE 1.05* 0.98 0.97 1.01*  CALCIUM 7.8* 8.3* 8.0* 7.9*  MG 2.6* 2.3 2.0 2.0     No results found for this or any previous visit (from the past 240 hour(s)).   Radiology Studies: [place] EKG SITE RITE  Result Date: 04/12/2022 If Site Rite image not attached, placement  could not be confirmed due to current cardiac rhythm.   Scheduled Meds:  budesonide (PULMICORT) nebulizer solution  0.5 mg Nebulization BID   Chlorhexidine Gluconate Cloth  6 each Topical Daily   dexamethasone  4 mg Oral Daily   feeding supplement  1 Container Oral TID BM   fentaNYL  1 patch Transdermal Q72H   fentaNYL   Intravenous Q4H   heparin injection (subcutaneous)  5,000 Units Subcutaneous Q8H   metoCLOPramide  5 mg Oral TID AC   mouth rinse  15 mL Mouth Rinse 4 times per day   Continuous Infusions:  dextrose 5 % and 0.45% NaCl 100 mL/hr at 04/13/22 0147   octreotide (SANDOSTATIN) 500 mcg in sodium chloride 0.9 % 250 mL (2 mcg/mL) infusion 100 mcg/hr (04/13/22 1013)     LOS: 11 days   Shelly Coss, MD [facility] P8/05/2022, 11:01 AM

## 2022-04-13 NOTE — Progress Notes (Signed)
Palliative Medicine Progress Note   Patient Name: Mariah Schultz       Date: 04/13/2022 DOB: [birth year]  Age: 66 y.o. 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  She was admitted from the cancer center on 04/01/2022 with weakness, dehydration, and AKI.  CT abdomen pelvis on 04/04/2022 was consistent with small bowel obstruction.   Palliative medicine team consulted for pain and on pain symptom management  Subjective: Chart reviewed, including labs, imaging, and progress notes.  I visited patient and her husband at bedside.  She reports that her pain is somewhat improved compared to yesterday, but she is having to "push the button every 10 minutes" on the PCA.  Discussed the clamping trials.  They have been diligent about clamping the tube for an hour after oral intake.  The RN reports minimal output after it is unclamped.  While patient is in the bathroom, her husband shares that they are realistic and understand that her condition is terminal.  Their goal is to have more time, but they want this to be quality time where her pain is controlled and she is able to enjoy being at home.  Dr. Tawanna Solo enters the room during our discussion.  Patient and husband are very concerned about being able to manage her at home.  They are declining discharge until her symptoms are better controlled.  Their main  goal is that she "not have to push the button so often".  Discussed adding a basal rate to the fentanyl PCA.   PCA pump reviewed.  24-hour drug total is 3232 mg, 81 demands, and 65 delivered doses.   Objective:  Physical Exam Constitutional:      General: She is not in acute distress. Pulmonary:     Effort: Pulmonary effort is normal.  Abdominal:     General: There is distension.  Neurological:     Mental Status: She is alert.             Vital Signs: BP (!) 153/70 (BP Location: Right Arm)  Pulse 63   Temp 98 F (36.7 C) (Oral)   Resp 18   Ht '5\' 4"'$  (1.626 m)   Wt 76.5 kg   SpO2 93%   BMI 28.96 kg/m  SpO2: SpO2: 93 % O2 Device: O2 Device: Nasal Cannula O2 Flow Rate: O2 Flow Rate (L/min): 2 L/min   LBM: Last BM Date :  (pt w/ostomy - no output)     Palliative Medicine Assessment & Plan   Assessment: Principal Problem:   AKI (acute kidney injury) ([place]) Active Problems:   Essential hypertension   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 06/22/2021   Colostomy present ([facility])   Hematuria   Hydronephrosis, right   Cancer related pain   Dehydration   Hyponatremia   Pyuria   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   Greater than 50%  of this time was spent counseling and coordinating care related to the above assessment and plan.  Total time: 96 minutes   Lavena Bullion, NP Palliative Medicine   Please contact Palliative Medicine Team phone at [phone] for questions and  concerns.  For individual provider, see AMION.

## 2022-04-13 NOTE — Consult Note (Signed)
[place] Nurse ostomy consult note Stoma type/location: LLQ colostomy Stomal assessment/size: 1 and 3/4 inch round, pink, moist, lumen in center, no function Peristomal assessment: intact Treatment options for stomal/peristomal skin: skin barrier ring Output: none Ostomy pouching: 1pc.soft convex pouching system, pouch is Kellie Simmering [phone], skin barrier ring is Kellie Simmering # [phone] Education provided: Patient and husband are performing ostomy care at home and ordering supplies from [facility]. Changes are every 3-4 days, emptying is PRN. Currently stoma is without function. Enrolled patient in [place] program: Yes, previously.  [place] nursing team will not follow, but will remain available to this patient, the nursing and medical teams.  Please re-consult if needed.  Thank you for inviting [place] to participate in this patient's Plan of Care.  Maudie Flakes, MSN, RN, CNS, [place], Serita Grammes, [employer], [employer] phone:  [phone]

## 2022-04-14 DIAGNOSIS — K56609 Unspecified intestinal obstruction, unspecified as to partial versus complete obstruction: Secondary | ICD-10-CM | POA: Diagnosis not present

## 2022-04-14 DIAGNOSIS — G893 Neoplasm related pain (acute) (chronic): Secondary | ICD-10-CM | POA: Diagnosis not present

## 2022-04-14 DIAGNOSIS — N179 Acute kidney failure, unspecified: Secondary | ICD-10-CM | POA: Diagnosis not present

## 2022-04-14 DIAGNOSIS — C4492 Squamous cell carcinoma of skin, unspecified: Secondary | ICD-10-CM | POA: Diagnosis not present

## 2022-04-14 MED ORDER — DIPHENHYDRAMINE HCL 12.5 MG/5ML PO ELIX: 12.5000 mg | ORAL_SOLUTION | Freq: Four times a day (QID) | ORAL | Status: DC | PRN

## 2022-04-14 MED ORDER — FENTANYL CITRATE PF 50 MCG/ML IJ SOSY
50.0000 ug | PREFILLED_SYRINGE | Freq: Once | INTRAMUSCULAR | Status: AC | PRN
  Administered 2022-04-14: 50 ug via INTRAVENOUS
  Filled 2022-04-14: qty 1

## 2022-04-14 MED ORDER — HYDROMORPHONE 1 MG/ML IV SOLN
INTRAVENOUS | Status: DC
  Administered 2022-04-14: 2.6 mg via INTRAVENOUS
  Administered 2022-04-14: 2.9 mg via INTRAVENOUS
  Administered 2022-04-14: 30 mg via INTRAVENOUS
  Administered 2022-04-15: 3 mg via INTRAVENOUS
  Administered 2022-04-15: 3.9 mg via INTRAVENOUS
  Administered 2022-04-15: 4.8 mg via INTRAVENOUS
  Administered 2022-04-15: 3 mg via INTRAVENOUS
  Administered 2022-04-15: 3.3 mg via INTRAVENOUS
  Administered 2022-04-16: 1.8 mg via INTRAVENOUS
  Administered 2022-04-16: 30 mg via INTRAVENOUS
  Administered 2022-04-16: 3 mg via INTRAVENOUS
  Administered 2022-04-16: 4.8 mg via INTRAVENOUS
  Administered 2022-04-16: 4.5 mg via INTRAVENOUS
  Filled 2022-04-14 (×2): qty 30

## 2022-04-14 MED ORDER — SODIUM CHLORIDE 0.9% FLUSH: 9.0000 mL | INTRAVENOUS | Status: DC | PRN

## 2022-04-14 MED ORDER — FENTANYL CITRATE PF 50 MCG/ML IJ SOSY
25.0000 ug | PREFILLED_SYRINGE | Freq: Once | INTRAMUSCULAR | Status: AC | PRN
  Administered 2022-04-14: 25 ug via INTRAVENOUS
  Filled 2022-04-14: qty 1

## 2022-04-14 MED ORDER — OXYCODONE HCL ER 15 MG PO T12A
15.0000 mg | EXTENDED_RELEASE_TABLET | Freq: Two times a day (BID) | ORAL | Status: DC
  Administered 2022-04-14 – 2022-04-18 (×9): 15 mg via ORAL
  Filled 2022-04-14 (×9): qty 1

## 2022-04-14 MED ORDER — POLYETHYLENE GLYCOL 3350 17 G PO PACK
17.0000 g | PACK | Freq: Every day | ORAL | Status: DC
Start: 2022-04-14 — End: 2022-04-18
  Administered 2022-04-15: 17 g via ORAL
  Filled 2022-04-14 (×3): qty 1

## 2022-04-14 MED ORDER — NALOXONE HCL 0.4 MG/ML IJ SOLN: 0.4000 mg | INTRAMUSCULAR | Status: DC | PRN

## 2022-04-14 MED ORDER — DIPHENHYDRAMINE HCL 50 MG/ML IJ SOLN: 12.5000 mg | Freq: Four times a day (QID) | INTRAMUSCULAR | Status: DC | PRN

## 2022-04-14 NOTE — Progress Notes (Signed)
Mobility Specialist - Progress Note   04/14/22 1206  Mobility  Activity Ambulated with assistance in hallway;Ambulated with assistance in room  Level of Assistance Standby assist, set-up cues, supervision of patient - no hands on  Assistive Device Front wheel walker  Distance Ambulated (ft) 250 ft  Activity Response Tolerated well  $Mobility charge 1 Mobility   Pt agreeable to mobilize this morning. No A required for pt to sit EOB. Sat up for 10 minutes to talk with family and NP, but c/o of feeling sleepy.Requested to stand ambulate shortly after. No complaints made when mobilizing. Returned to room and was left sitting EOB with family and NP in room.   [place] Specialist Acute Rehabilitation Services Phone: [phone] 04/14/22, 12:09 PM

## 2022-04-14 NOTE — Care Management Important Message (Signed)
Important Message  Patient Details IM Letter given to the Patient. Name: Mariah Schultz MRN: [id number] Date of Birth: 02-18-1956   Medicare Important Message Given:  Yes     Kerin Salen 04/14/2022, 10:56 AM

## 2022-04-14 NOTE — Progress Notes (Signed)
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 Patient was admitted from cancer center for weakness, dehydration and AKI per Dr. Libby Maw request.  .  Hospital course complicated by SBO possibly from pelvic mass.  General surgery was following.  Status post venting PEG tube placement 8/4 .  Hospitalist remarkable for persistent pain.  There was ongoing discussion about goals of care and long  term nutrition.  Palliative care closely following.  Plan is to discharge home with home health  with PCA pump when pain is better managed.  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  Also had severe pelvic pain going from pelvic mass.  CT showed small bowel obstruction, likely main obstruction from pelvic mass.  General surgery consulted.  Discussed surgical option but she was not a good candidate.  Status post venting G-tube placed by IR for palliative performance.  Plan is to discharge her with PCA pump. She  previously had diverting colostomy for partial colonic obstruction. Patient is on fentanyl PCA, fentanyl patch, also on octreotide infusion.  Palliative care managing the pain. TPN held off.On full liquid diet. Her abdomen is distended and she complains of abdominal pain, no bowel meant for last 3 days.  We have requested for general surgery follow-up.  Squamous cell carcinoma of pelvis of unclear etiology: Poorly differentiated, high-grade carcinoma.  Concern for cervical cancer.  Oncology is following during this hospitalization.  She also has hydronephrosis secondary to pelvic mass. Oncology planning to follow-up her as an outpatient for next treatment with immunotherapy.  Continue pain management, supportive care.  Sepsis secondary to UTI: Sepsis physiology has resolved.  Completed 5 days course of ceftriaxone  Normocytic anemia: Currently hemoglobin stable.  Given a unit of blood transfusion on 8/7.  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  Status post right-sided stent placement by urology on 7/18 for hydronephrosis secondary to pelvic mass  Hypertension: Currently BP stable        DVT prophylaxis:heparin injection 5,000 Units Start: 04/05/22 0845 SCDs Start: 04/01/22 1753     Code Status: Full Code  Family Communication: daughter at bedside on 8/9  Patient status:Inpatient  Patient is from :Home  Anticipated discharge WE:RXVQ  Estimated DC date:1-2 days.  After adequate pain control discharge   Consultants: palliative care, general surgery, IR  Procedures: Venting G-tube  Antimicrobials:  Anti-infectives (From admission, onward)    Start     Dose/Rate Route Frequency Ordered Stop   04/08/22 0600  ceFAZolin (ANCEF) IVPB 2g/100 mL premix        2 g  200 mL/hr over 30 Minutes Intravenous To Radiology 04/07/22 1618 04/08/22 0940   04/01/22 1930  cefTRIAXone (ROCEPHIN) 1 g in sodium chloride 0.9 % 100 mL IVPB        1 g 200 mL/hr over  30 Minutes Intravenous Every 24 hours 04/01/22 1830 04/05/22 2100       Subjective:  Patient seen and examined at the bedside this morning.  Hemodynamically stable.  She was eating her breakfast.  Daughter at the bedside.  She looks overall comfortable, but is still in pain.  No bowel movement for the last 3 days.  No nausea or vomiting.  Objective: Vitals:   04/14/22 0332 04/14/22 0519 04/14/22 0819 04/14/22 0824  BP:  (!) 155/64    Pulse:  67    Resp: '18 18  18  '$ Temp:  98.4 F (36.9 C)    TempSrc:  Oral    SpO2: 94% 97% 99% 99%  Weight:      Height:        Intake/Output Summary (Last 24 hours) at 04/14/2022 1109 Last data filed at 04/14/2022 0502 Gross per 24 hour  Intake 1045.29 ml  Output 2675 ml  Net -1629.71 ml   Filed Weights   04/03/22 2024  Weight: 76.5 kg    Examination:   General exam: Overall comfortable, chronically looking, weak, deconditioned HEENT: PERRL Respiratory system:  no wheezes or crackles  Cardiovascular system: S1 & S2 heard, RRR.  Gastrointestinal system: Abdomen is distended, soft, colostomy, pending G-tube. Central nervous system: Alert and oriented Extremities: Trace bilateral lower extremity edema, no clubbing ,no cyanosis Skin: No rashes, no ulcers,no icterus     Data Reviewed: I have personally reviewed following labs and imaging studies  CBC: Recent Labs  Lab 04/08/22 1028 04/09/22 0542 04/13/22 0448  WBC 8.7 8.1 4.2  HGB 7.7* 7.4* 8.2*  HCT 24.1* 22.9* 25.1*  MCV 94.5 94.2 92.6  PLT 412* 370 956   Basic Metabolic Panel: Recent Labs  Lab 04/08/22 1028 04/09/22 0542 04/13/22 0448  NA 133* 132* 136  K 4.3 4.3 3.5  CL 100 98 98  CO2 '26 26 30  '$ GLUCOSE 147* 153* 162*  BUN '15 15 14  '$ CREATININE 0.98 0.97 1.01*  CALCIUM 8.3* 8.0* 7.9*  MG 2.3 2.0 2.0     No results found for this or any previous visit (from the past 240 hour(s)).   Radiology Studies: No results found.  Scheduled Meds:  budesonide (PULMICORT)  nebulizer solution  0.5 mg Nebulization BID   Chlorhexidine Gluconate Cloth  6 each Topical Daily   dexamethasone  4 mg Oral Daily   feeding supplement  1 Container Oral TID BM   fentaNYL  1 patch Transdermal Q72H   fentaNYL   Intravenous Q4H   heparin injection (subcutaneous)  5,000 Units Subcutaneous Q8H   metoCLOPramide  5 mg Oral TID AC   mouth rinse  15 mL Mouth Rinse 4 times per day   polyethylene glycol  17 g Oral Daily   Continuous Infusions:  dextrose 5 % and 0.45% NaCl 50 mL/hr at 04/14/22 1036   octreotide (SANDOSTATIN) 500 mcg in sodium chloride 0.9 % 250 mL (2 mcg/mL) infusion 100 mcg/hr (04/14/22 0826)     LOS: 12 days   Shelly Coss, MD [facility] P8/06/2022, 11:09 AM

## 2022-04-14 NOTE — Progress Notes (Signed)
Palliative Medicine Progress Note   Patient Name: Mariah Schultz       Date: 04/14/2022 DOB: Oct 02, 1955  Age: 66 y.o. 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  She was admitted from the cancer center on 04/01/2022 with weakness, dehydration, and AKI.  CT abdomen pelvis on 04/04/2022 was consistent with small bowel obstruction.   Palliative medicine team consulted for pain and symptom management.  Subjective: I met today with patient, her husband, and her daughter at bedside. Patient reports that her pain is not better controlled, despite addition of basal rate of 100 mcg/hr to fentanyl PCA. She reports continued 7/10 abdominal pain.   Patient and family express wanting her pain to be "tolerable" but also wanting her to be awake and not "sleeping all the time". Discussed that this can often be challenging in the setting of advanced cancer.   Extensive education provided on short-acting versus long-acting opioids as well as IV versus oral routes. [name] is adamant to start a long-acting PO opioid. I expressed concern that PO medication may not be well-absorbed in the setting of small bowel obstruction; however he wants to at least trial this. Also discussed switching PCA to dilaudid, as it seems fentanyl is not providing adequate pain relief.   Patient and family continue to endorse that  the ultimate goal is to get her home. Patient specifically speaks to wanting to enjoy time outdoors and in her garden.    Objective:  Physical Exam Vitals reviewed.  Constitutional:      General: She is not in acute distress.    Appearance: She is ill-appearing.  Pulmonary:     Effort: Pulmonary effort is normal.  Abdominal:     General: There is distension.  Neurological:     Mental Status: She is alert and oriented to person, place, and time.             Vital Signs: BP (!) 155/64 (BP Location: Right Arm)   Pulse 67   Temp 98.4 F (36.9 C) (Oral)  Resp 18   Ht $R'5\' 4"'UB$  (1.626 m)   Wt 76.5 kg   SpO2 99%   BMI 28.96 kg/m  SpO2: SpO2: 99 % O2 Device: O2 Device: Room Air O2 Flow Rate: O2 Flow Rate (L/min): 0 L/min    LBM: Last BM Date :  (no output from ostomy)      Palliative Medicine Assessment & Plan   Assessment: Principal Problem:   AKI (acute kidney injury) ([place]) Active Problems:   Essential hypertension   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 06/22/2021   Colostomy present ([place])   Hematuria   Hydronephrosis, right   Cancer related pain   Dehydration   Hyponatremia   Pyuria   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   Greater than 50%  of this time was spent counseling and coordinating care related to the above assessment and plan.  Total time: 75 minutes   Lavena Bullion, NP Palliative Medicine   Please  contact Palliative Medicine Team phone at [phone] for questions and concerns.  For individual provider, see AMION.

## 2022-04-15 ENCOUNTER — Inpatient Hospital Stay (HOSPITAL_COMMUNITY): Payer: Medicare HMO

## 2022-04-15 DIAGNOSIS — R609 Edema, unspecified: Secondary | ICD-10-CM | POA: Diagnosis not present

## 2022-04-15 DIAGNOSIS — N179 Acute kidney failure, unspecified: Secondary | ICD-10-CM | POA: Diagnosis not present

## 2022-04-15 MED ORDER — TRAZODONE HCL 50 MG PO TABS
50.0000 mg | ORAL_TABLET | Freq: Every day | ORAL | Status: DC
  Administered 2022-04-15 – 2022-04-18 (×4): 50 mg via ORAL
  Filled 2022-04-15 (×4): qty 1

## 2022-04-15 NOTE — TOC Progression Note (Signed)
Transition of Care [facility]) - Progression Note    Patient Details  Name: Mariah Schultz MRN: [id number] Date of Birth: 02-20-1956  [facility]) CM/SW Contact  Servando Snare, [place] Phone Number: 04/15/2022, 9:54 AM  Clinical Narrative:   [place] [facility] arranged infusions scheduled to come to hospital today to connect patient for dc home. Per attending patient no longer dcing today. Pam notified. Infusions cannot come out out over the weekend.  Attending notified.     Expected Discharge Plan: [place] Barriers to Discharge: Continued Medical Work up  Expected Discharge Plan and Services Expected Discharge Plan: [place] In-house Referral: Clinical Social Work   Post Acute Care Choice: Home Health                   DME Arranged: Oxygen, Nebulizer machine DME Agency: [employer] Date DME Agency Contacted: 04/13/22   Representative spoke with at DME Agency: Andee Poles HH Arranged: RN [place] [facility] Agency: [place] Date [place]: 04/13/22   Representative spoke with at [place]: [place] ([place]) Interventions    Readmission Risk Interventions    04/03/2022    5:52 PM 04/03/2022    4:06 PM  Readmission Risk Prevention Plan  Transportation Screening Complete Complete  PCP or Specialist Appt within 5-7 Days Complete Complete  Home Care Screening Complete Complete  Medication Review (RN CM) Referral to Pharmacy Referral to Pharmacy

## 2022-04-15 NOTE — Progress Notes (Signed)
Palliative Medicine Progress Note   Patient Name: Mariah Schultz       Date: 04/15/2022 DOB: [birth year]  Age: 66 y.o. 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  She was admitted from the cancer center on 04/01/2022 with weakness, dehydration, and AKI.  CT abdomen pelvis on 04/04/2022 was consistent with small bowel obstruction.   Palliative medicine team consulted for pain and symptom management.  Subjective: I met today with patient, her husband at bedside.   Patient reports that her PO intake is reasonable, she is tolerating current diet of full liquid. She is trying to participate with PT. She complains of abdominal pain and not being able to sleep. She states that her pain is never less than 5/10, her acceptable pain number is to be less than 3/10 for pain. At times, she states that the pain gets as high as 7-8/10. She wants to see how she does on the PO OxyContin and bolus dose Dilaudid PCA before she considers going home.   Today, we also compared and contrasted home with home health and palliative versus home with hospice on discharge. She wishes to seek further input from oncology as well. She does not want to make a decision just yet.     Objective:  Physical Exam Vitals reviewed.  Constitutional:      General: She is not in acute distress.    Appearance: She is ill-appearing.   Pulmonary:     Effort: Pulmonary effort is normal.  Abdominal:     General: There is distension.  Neurological:     Mental Status: She is alert and oriented to person, place, and time.             Vital Signs: BP (!) 164/90 (BP Location: Right Arm)   Pulse 74   Temp 98.3 F (36.8 C) (Oral)   Resp 12   Ht _0  (1.626 m)   Wt 76.5 kg   SpO2 96%   BMI 28.96 kg/m  SpO2: SpO2: 96 % O2 Device: O2 Device:  Nasal Cannula O2 Flow Rate: O2 Flow Rate (L/min): 2 L/min    LBM: Last BM Date :  (no output from ostomy)      Palliative Medicine Assessment & Plan   Assessment: Principal Problem:   AKI (acute kidney injury) ([place]) Active Problems:   Essential hypertension   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 06/22/2021   Colostomy present ([place])   Hematuria   Hydronephrosis, right   Cancer related pain   Dehydration   Hyponatremia   Pyuria   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    Prognosis:  Guarded  Discharge Planning: Goal is home   Thank you for allowing the Palliative Medicine Team to assist in the care of this patient.   Greater than 50%  of this time was spent counseling and coordinating care related to the above assessment and plan.  Mod MDM Loistine Chance MD Palliative Medicine   Please contact Palliative Medicine Team phone at [phone] for questions and concerns.  For individual provider, see AMION.

## 2022-04-15 NOTE — Progress Notes (Signed)
Bilateral lower extremity venous duplex has been completed. Preliminary results can be found in CV Proc through chart review.   04/15/22 2:15 PM Mariah Schultz RVT

## 2022-04-15 NOTE — Progress Notes (Signed)
Patient's abdomen very distended and complaining of pain.  1100 ml emptied from g tube drainage bag this morning and 300 in drainage bag emptied at this time.  Hooked g tube to suction and still no drainage.  Flushed with 30cc of sterile water and immediately started draining.  900 immedicately emptied and still draining at this time. Patient reports relief.

## 2022-04-15 NOTE — Progress Notes (Signed)
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 Patient was admitted from cancer center for weakness, dehydration and AKI per Dr. Libby Maw request.  .  Hospital course complicated by SBO possibly from pelvic mass.  General surgery was following.  Status post venting PEG tube placement 8/4 .  Hospitalist remarkable for persistent pain.  There was ongoing discussion about goals of care and long  term nutrition.  Palliative care closely following.  Plan is to discharge home with home health  with PCA pump when pain is better managed.  Oncology also seeing her today.  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  Also had severe pelvic pain going from pelvic mass.  CT showed small bowel obstruction, likely main obstruction from pelvic mass.  General surgery consulted.  Discussed surgical option but she was not a good candidate.  Status post venting G-tube placed by IR for palliative performance.  Plan is to  discharge her with PCA pump. She previously had diverting colostomy for partial colonic obstruction. Patient is on dilaudid PCA, fentanyl patch, also on octreotide infusion.  Palliative care managing the pain. TPN held off.On full liquid diet. Her abdomen is distended and she complains of abdominal pain, no bowel meant for last 3-4 days.   Squamous cell carcinoma of pelvis of unclear etiology: Poorly differentiated, high-grade carcinoma.  Concern for cervical cancer.  Oncology is following during this hospitalization.  She also has hydronephrosis secondary to pelvic mass. Oncology planning to follow-up her as an outpatient for next treatment with immunotherapy.  Continue pain management, supportive care. Oncology will follow up on her today  Sepsis secondary to UTI: Sepsis physiology has resolved.  Completed 5 days course of ceftriaxone  Normocytic anemia: Currently hemoglobin stable.  Given a unit of blood transfusion on 8/7.  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  Status post right-sided stent placement by urology on 7/18 for hydronephrosis secondary to pelvic mass  Hypertension: Currently BP stable        DVT prophylaxis:heparin injection 5,000 Units Start: 04/05/22 0845 SCDs Start: 04/01/22 1753     Code Status: Full Code  Family Communication: family  at bedside  Patient status:Inpatient  Patient is from :Home  Anticipated discharge FG:HWEX  Estimated DC date:After adequate pain control discharge   Consultants: palliative care, general surgery, IR  Procedures: Venting G-tube  Antimicrobials:  Anti-infectives (From admission, onward)    Start     Dose/Rate Route Frequency Ordered Stop   04/08/22 0600  ceFAZolin (ANCEF) IVPB 2g/100 mL premix  2 g 200 mL/hr over 30 Minutes Intravenous To Radiology 04/07/22 1618 04/08/22 0940   04/01/22 1930  cefTRIAXone (ROCEPHIN) 1 g in sodium chloride 0.9 % 100 mL IVPB        1 g 200 mL/hr  over 30 Minutes Intravenous Every 24 hours 04/01/22 1830 04/05/22 2100       Subjective:  Patient seen and examined at the bedside this morning.  Family at bedside.  She was lying in bed.  Her main complaint was sleep.  She could not sleep last night.  As per family, pain is better controlled than yesterday.  No bowel movement  Objective: Vitals:   04/14/22 1931 04/14/22 2034 04/14/22 2129 04/15/22 0429  BP:   (!) 164/90   Pulse:   74   Resp:  '20 18 12  '$ Temp:   98.3 F (36.8 C)   TempSrc:   Oral   SpO2: 94% 93% 96% 96%  Weight:      Height:        Intake/Output Summary (Last 24 hours) at 04/15/2022 1144 Last data filed at 04/15/2022 0900 Gross per 24 hour  Intake 904.61 ml  Output 4050 ml  Net -3145.39 ml   Filed Weights   04/03/22 2024  Weight: 76.5 kg    Examination:  General exam: Very deconditioned, weak, chronically ill looking HEENT: PERRL Respiratory system:  no wheezes or crackles  Cardiovascular system: S1 & S2 heard, RRR.  Gastrointestinal system: Abdomen is distended, colostomy, venting G-tube Central nervous system: Alert and oriented Extremities: Trace bilateral lower extremity edema, no clubbing ,no cyanosis Skin: No rashes, no ulcers,no icterus     Data Reviewed: I have personally reviewed following labs and imaging studies  CBC: Recent Labs  Lab 04/09/22 0542 04/13/22 0448  WBC 8.1 4.2  HGB 7.4* 8.2*  HCT 22.9* 25.1*  MCV 94.2 92.6  PLT 370 425   Basic Metabolic Panel: Recent Labs  Lab 04/09/22 0542 04/13/22 0448  NA 132* 136  K 4.3 3.5  CL 98 98  CO2 26 30  GLUCOSE 153* 162*  BUN 15 14  CREATININE 0.97 1.01*  CALCIUM 8.0* 7.9*  MG 2.0 2.0     No results found for this or any previous visit (from the past 240 hour(s)).   Radiology Studies: No results found.  Scheduled Meds:  budesonide (PULMICORT) nebulizer solution  0.5 mg Nebulization BID   Chlorhexidine Gluconate Cloth  6 each Topical Daily   dexamethasone  4 mg  Oral Daily   feeding supplement  1 Container Oral TID BM   heparin injection (subcutaneous)  5,000 Units Subcutaneous Q8H   HYDROmorphone   Intravenous Q4H   metoCLOPramide  5 mg Oral TID AC   mouth rinse  15 mL Mouth Rinse 4 times per day   oxyCODONE  15 mg Oral Q12H   polyethylene glycol  17 g Oral Daily   Continuous Infusions:  dextrose 5 % and 0.45% NaCl 50 mL/hr at 04/14/22 1036   octreotide (SANDOSTATIN) 500 mcg in sodium chloride 0.9 % 250 mL (2 mcg/mL) infusion 100 mcg/hr (04/15/22 1010)     LOS: 13 days   Shelly Coss, MD [facility] P8/07/2022, 11:44 AM

## 2022-04-15 NOTE — Progress Notes (Signed)
Hematology/Oncology Progress Note  Clinical Summary: 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  Interval History: -- On a full liquid diet-nursing clamps for about 1 hour after eating and then unclamped --Continues to have significant pain despite being on Dilaudid PCA.  Palliative care continues to follow and is adjusting medication. --Has right lower extremity swelling for the past few days.  O:  Vitals:   04/14/22 2129 04/15/22 0429  BP: (!) 164/90   Pulse: 74   Resp: 18 12  Temp: 98.3 F (36.8 C)   SpO2: 96% 96%      Latest Ref Rng & Units 04/13/2022    4:48 AM 04/09/2022    5:42 AM 04/08/2022   10:28 AM  CMP  Glucose 70 - 99 mg/dL 162  153  147   BUN 8 - 23 mg/dL '14  15  15   '$ Creatinine 0.44 - 1.00 mg/dL 1.01  0.97  0.98   Sodium 135 - 145 mmol/L 136  132  133   Potassium 3.5 - 5.1 mmol/L 3.5  4.3  4.3   Chloride 98 - 111 mmol/L 98  98  100   CO2 22 - 32 mmol/L '30  26  26   '$ Calcium 8.9 - 10.3 mg/dL 7.9  8.0  8.3       Latest Ref Rng & Units 04/13/2022    4:48 AM 04/09/2022    5:42 AM 04/08/2022   10:28 AM  CBC  WBC 4.0 - 10.5 K/uL 4.2  8.1  8.7   Hemoglobin 12.0 - 15.0 g/dL 8.2  7.4  7.7   Hematocrit 36.0 - 46.0 % 25.1  22.9  24.1   Platelets 150 - 400 K/uL 279  370  412       GENERAL: Chronically ill-appearing elderly African-American female, in NAD  SKIN: skin color, texture, turgor are normal, no rashes or significant lesions EYES: conjunctiva are pink and non-injected, sclera clear LUNGS: clear to auscultation and percussion with normal breathing effort HEART: regular rate & rhythm and no murmurs and no lower extremity edema Musculoskeletal: no cyanosis of digits and no clubbing  PSYCH: alert & oriented x 3, fluent speech NEURO: no focal motor/sensory deficits  Assessment/Plan: [name]  is a 66 year old female with medical history significant for 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  The patient had a venting G-tube placed as a surgical relief of the obstruction was not recommended.  Continues to have uncontrolled pain despite being on Dilaudid PCA.  Palliative care following.   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  Next treatment will be due on 04/22/2022. --No evidence of obstructive stent, creatinine improving with hydration --Small bowel obstruction secondary to pelvic mass, surgery currently following and evaluating.  -- Venting G-tube in place and patient on a full liquid diet -- Currently on a PCA and palliative care is assisting with pain control. --Appreciate the assistance of palliative care regarding discussions and supportive treatment moving forward. -- The patient and her family members had questions regarding palliative care versus hospice.  I have explained that palliative  care focuses on symptom management and that she could continue on treatment with immunotherapy if she elects palliative care.  I further explained that hospice services are indicated for people with an estimated life expectancy of less than 6 months and that the goal of hospice would be to focus on comfort measures.  I have clearly explained to the patient and her family members that she could not receive immunotherapy if she elects hospice.  The patient was very clear in stating that she did not want hospice. --I have further explained to the patient and her family that she is still scheduled for immunotherapy on 8/18 and that we will reevaluate her in the  office on that date to be sure she is a candidate for treatment.  I made them aware that we cannot administer immunotherapy in the hospital.  If she remains in the hospital, we will need to delay her treatment. --I advised them that short-term goal is for pain control so the patient may leave the hospital and continue outpatient follow-up with oncology.  # Right lower extremity edema --Could be due to pelvic mass causing obstruction versus DVT --Will obtain Doppler ultrasound of lower extremity to evaluate for DVT and if positive, will need anticoagulation --Recommend compression stockings pending results of Doppler ultrasound  Mikey Bussing

## 2022-04-16 ENCOUNTER — Other Ambulatory Visit: Payer: Self-pay | Admitting: Hematology and Oncology

## 2022-04-16 ENCOUNTER — Inpatient Hospital Stay (HOSPITAL_COMMUNITY): Payer: Medicare HMO

## 2022-04-16 DIAGNOSIS — C4492 Squamous cell carcinoma of skin, unspecified: Secondary | ICD-10-CM | POA: Diagnosis not present

## 2022-04-16 DIAGNOSIS — R109 Unspecified abdominal pain: Secondary | ICD-10-CM | POA: Diagnosis not present

## 2022-04-16 DIAGNOSIS — N179 Acute kidney failure, unspecified: Secondary | ICD-10-CM | POA: Diagnosis not present

## 2022-04-16 DIAGNOSIS — N1330 Unspecified hydronephrosis: Secondary | ICD-10-CM | POA: Diagnosis not present

## 2022-04-16 DIAGNOSIS — K56609 Unspecified intestinal obstruction, unspecified as to partial versus complete obstruction: Secondary | ICD-10-CM | POA: Diagnosis not present

## 2022-04-16 DIAGNOSIS — Z933 Colostomy status: Secondary | ICD-10-CM | POA: Diagnosis not present

## 2022-04-16 DIAGNOSIS — Z515 Encounter for palliative care: Secondary | ICD-10-CM

## 2022-04-16 LAB — COMPREHENSIVE METABOLIC PANEL
ALT: 11 U/L (ref 0–44)
AST: 10 U/L — ABNORMAL LOW (ref 15–41)
Albumin: 2.2 g/dL — ABNORMAL LOW (ref 3.5–5.0)
Alkaline Phosphatase: 81 U/L (ref 38–126)
Anion gap: 11 (ref 5–15)
BUN: 12 mg/dL (ref 8–23)
CO2: 33 mmol/L — ABNORMAL HIGH (ref 22–32)
Calcium: 8.2 mg/dL — ABNORMAL LOW (ref 8.9–10.3)
Chloride: 98 mmol/L (ref 98–111)
Creatinine, Ser: 1.31 mg/dL — ABNORMAL HIGH (ref 0.44–1.00)
GFR, Estimated: 45 mL/min — ABNORMAL LOW (ref >60)
Glucose, Bld: 149 mg/dL — ABNORMAL HIGH (ref 70–99)
Potassium: 3.2 mmol/L — ABNORMAL LOW (ref 3.5–5.1)
Sodium: 142 mmol/L (ref 135–145)
Total Bilirubin: 0.5 mg/dL (ref 0.3–1.2)
Total Protein: 5.6 g/dL — ABNORMAL LOW (ref 6.5–8.1)

## 2022-04-16 LAB — CBC
HCT: 26.6 % — ABNORMAL LOW (ref 36.0–46.0)
Hemoglobin: 8.5 g/dL — ABNORMAL LOW (ref 12.0–15.0)
MCH: 30.2 pg (ref 26.0–34.0)
MCHC: 32 g/dL (ref 30.0–36.0)
MCV: 94.7 fL (ref 80.0–100.0)
Platelets: 246 10*3/uL (ref 150–400)
RBC: 2.81 MIL/uL — ABNORMAL LOW (ref 3.87–5.11)
RDW: 15.2 % (ref 11.5–15.5)
WBC: 4.9 10*3/uL (ref 4.0–10.5)
nRBC: 0 % (ref 0.0–0.2)

## 2022-04-16 MED ORDER — NALOXONE HCL 0.4 MG/ML IJ SOLN: 0.4000 mg | INTRAMUSCULAR | Status: DC | PRN

## 2022-04-16 MED ORDER — ONDANSETRON HCL 4 MG/2ML IJ SOLN
4.0000 mg | Freq: Four times a day (QID) | INTRAMUSCULAR | Status: DC | PRN
Start: 2022-04-16 — End: 2022-04-19

## 2022-04-16 MED ORDER — POTASSIUM CHLORIDE 10 MEQ/100ML IV SOLN
10.0000 meq | INTRAVENOUS | Status: AC
  Administered 2022-04-16 (×4): 10 meq via INTRAVENOUS
  Filled 2022-04-16 (×4): qty 100

## 2022-04-16 MED ORDER — DIPHENHYDRAMINE HCL 50 MG/ML IJ SOLN: 12.5000 mg | Freq: Four times a day (QID) | INTRAMUSCULAR | Status: DC | PRN

## 2022-04-16 MED ORDER — DIPHENHYDRAMINE HCL 12.5 MG/5ML PO ELIX: 12.5000 mg | ORAL_SOLUTION | Freq: Four times a day (QID) | ORAL | Status: DC | PRN

## 2022-04-16 MED ORDER — HYDROMORPHONE 1 MG/ML IV SOLN
INTRAVENOUS | Status: DC
  Administered 2022-04-16: 5.5 mg via INTRAVENOUS
  Administered 2022-04-16: 3.5 mg via INTRAVENOUS
  Administered 2022-04-16: 8.4 mg via INTRAVENOUS
  Administered 2022-04-17: 4 mg via INTRAVENOUS
  Administered 2022-04-17: 5.5 mg via INTRAVENOUS
  Administered 2022-04-17: 30 mg via INTRAVENOUS
  Administered 2022-04-17: 5 mg via INTRAVENOUS
  Administered 2022-04-17: 4 mg via INTRAVENOUS
  Administered 2022-04-17: 6.5 mg via INTRAVENOUS
  Administered 2022-04-18: 30 mg via INTRAVENOUS
  Administered 2022-04-18: 3.5 mg via INTRAVENOUS
  Administered 2022-04-18: 6 mg via INTRAVENOUS
  Administered 2022-04-18: 3.5 mg via INTRAVENOUS
  Filled 2022-04-16 (×3): qty 30

## 2022-04-16 MED ORDER — SODIUM CHLORIDE 0.9% FLUSH: 9.0000 mL | INTRAVENOUS | Status: DC | PRN

## 2022-04-16 NOTE — Progress Notes (Signed)
Palliative Medicine Progress Note   Patient Name: Mariah Schultz       Date: 04/16/2022 DOB: [birth year]  Age: 66 y.o. 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  She was admitted from the cancer center on 04/01/2022 with weakness, dehydration, and AKI.  CT abdomen pelvis on 04/04/2022 was consistent with small bowel obstruction.   Palliative medicine team consulted for pain and symptom management.  Subjective: I met today with patient, her husband at bedside. Also met with patient's husband and daughter outside the room.   Patient reports that pain is not controlled, she did not rest well overnight.     Objective:  Physical Exam Vitals reviewed.  Constitutional:      General: She is not in acute distress.    Appearance: She is ill-appearing.  Pulmonary:     Effort: Pulmonary effort is normal.  Abdominal:     General: There is distension.  Neurological:     Mental Status: She is alert and oriented to person, place, and time.             Vital Signs: BP (!) 154/72 (BP Location: Left Arm)   Pulse 65   Temp 98.4 F (36.9 C) (Oral)   Resp 17   Ht $R'5\' 4"'zY$  (1.626 m)   Wt 76.5 kg   SpO2 96%   BMI 28.96 kg/m  SpO2: SpO2: 96 % O2 Device: O2 Device: Nasal Cannula O2 Flow Rate: O2 Flow Rate (L/min): 2 L/min    LBM: Last BM Date :  (no output from ostomy)      Palliative Medicine  Assessment & Plan   Assessment: Principal Problem:   AKI (acute kidney injury) ([place]) Active Problems:   Essential hypertension   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 06/22/2021   Colostomy present ([facility])   Hematuria   Hydronephrosis, right   Cancer related pain   Dehydration   Hyponatremia   Pyuria   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   Generalized weakness   Palliative care patient  Recommendations/Plan: Code status modified to DNR DNI after extensive discussions today.   Patient's goal remains to return home and get through another Keytruda treatment Continue octreotide infusion, CT to be done today.  modify dilaudid PCA. Pump queried: total 22.5 mg IV Dilaudid used, 97 demands and 75 deliveries.  Continue Oxycodone 15 mg every 12 hours (anticipate this will need to be titrated up) PMT will continue to support, appreciate oncology follow up.    Prognosis:  Guarded  Discharge Planning: Goal is home   Thank you for allowing the Palliative Medicine Team to assist in the care of this patient.   Greater than 50%  of this time was spent counseling and coordinating care related to the above assessment and plan.  Mod MDM Loistine Chance MD Palliative Medicine   Please contact Palliative Medicine Team phone at [phone] for questions and concerns.  For individual provider, see AMION.

## 2022-04-16 NOTE — Progress Notes (Signed)
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 Patient was admitted from cancer center for weakness, dehydration and AKI per Dr. Libby Maw request.  .  Hospital course complicated by SBO possibly from pelvic mass.  General surgery was following, now signed off.  Status post venting PEG tube placement 8/4 . There was ongoing discussion about goals of care .  Palliative care closely following.  Currently on PCA pump.  Oncology also following.Hospital course remarkable for increasing pain, abdominal distention. Despite multiple goals of care discussions, recommendation for comfort care/hospice, patient wants to continue current management.  Very high risks of decompensation.  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  Also had severe pelvic pain coming from pelvic mass.  CT showed small bowel obstruction, likely the obstruction from pelvic mass.  General surgery consulted.  Discussed surgical option but  she was not a good candidate.  Status post venting G-tube placed by IR for palliative performance.  Plan was to discharge her with PCA pump. She previously had diverting colostomy for partial colonic obstruction. Patient is on dilaudid PCA, fentanyl patch, also on octreotide infusion.  Palliative care managing the pain. TPN held off.On full liquid diet. Her abdomen is distended and she complains of increase lower abdominal pain, no bowel meant for last 3-4 days.  Checking CT abdomen/pelvis without contrast today.  Squamous cell carcinoma of pelvis of unclear etiology: Poorly differentiated, high-grade carcinoma.  Concern for cervical cancer.  Oncology is following during this hospitalization.  She also has hydronephrosis secondary to pelvic mass. Oncology was planning to follow-up her as an outpatient for next treatment with immunotherapy but now recommending hospice approach/comfort care because patient is declining fast..  Sepsis secondary to UTI: Sepsis physiology has resolved.  Completed 5 days course of ceftriaxone  Normocytic anemia: Currently hemoglobin stable.  Given a unit of blood transfusion on 8/7.  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  Status post right-sided stent placement by urology on 7/18 for hydronephrosis secondary to pelvic mass  Hypertension: Currently BP stable  AKI: New problem.  Continue to monitor.  On gentle IV fluids.  Hypokalemia: Supplemented with potassium        DVT prophylaxis:Place and maintain sequential compression device Start: 04/15/22 1819 heparin injection 5,000 Units Start: 04/05/22 0845 SCDs Start: 04/01/22 1753     Code Status: Full Code  Family Communication: family  at bedside  Patient status:Inpatient  Patient is from :Home  Anticipated discharge PP:IRJJ  Estimated DC date: Not sure.  Patient is rapidly declining.  Consultants: palliative care, general surgery, IR  Procedures: Venting  G-tube  Antimicrobials:  Anti-infectives (From admission, onward)    Start     Dose/Rate Route Frequency Ordered Stop   04/08/22 0600  ceFAZolin (ANCEF) IVPB 2g/100 mL premix        2 g 200 mL/hr over 30 Minutes Intravenous To Radiology 04/07/22 1618 04/08/22 0940   04/01/22 1930  cefTRIAXone (ROCEPHIN) 1 g in sodium chloride 0.9 % 100 mL IVPB        1 g 200 mL/hr over 30 Minutes Intravenous Every 24 hours 04/01/22 1830 04/05/22 2100       Subjective:  Seen and examined at the bedside this morning.  She was in severe pain, unable to sit on the bed.  Family at the bedside.  Patient was crying with pain.  Long discussion about goals of care done at the bedside recommending comfort care/hospice but patient wants to continue aggressive care management.  Objective: Vitals:   04/16/22 0220 04/16/22 0451 04/16/22 0504 04/16/22 0822  BP:   (!) 154/72   Pulse:   65   Resp: '14 17  17  '$ Temp:   98.4 F (36.9 C)   TempSrc:   Oral   SpO2: 94% 99% 96% 96%  Weight:      Height:        Intake/Output Summary (Last 24 hours) at 04/16/2022 1108 Last data filed at 04/16/2022 0538 Gross per 24 hour  Intake 1289.74 ml  Output 3300 ml  Net -2010.26 ml   Filed Weights   04/03/22 2024  Weight: 76.5 kg    Examination:  General exam: In severe distress due to low back pain, lower abdominal pain.  Very deconditioned, weak HEENT: PERRL Respiratory system:  no wheezes or crackles  Cardiovascular system: S1 & S2 heard, RRR.  Gastrointestinal system: Abdomen is distended, generalized tenderness.  Colostomy, venting G-tube Central nervous system: Alert and oriented Extremities: Bilateral  lower extremity edema, no clubbing ,no cyanosis Skin: No rashes, no ulcers,no icterus     Data Reviewed: I have personally reviewed following labs and imaging studies  CBC: Recent Labs  Lab 04/13/22 0448 04/16/22 0537  WBC 4.2 4.9  HGB 8.2* 8.5*  HCT 25.1* 26.6*  MCV 92.6 94.7  PLT 279 176   Basic  Metabolic Panel: Recent Labs  Lab 04/13/22 0448 04/16/22 0537  NA 136 142  K 3.5 3.2*  CL 98 98  CO2 30 33*  GLUCOSE 162* 149*  BUN 14 12  CREATININE 1.01* 1.31*  CALCIUM 7.9* 8.2*  MG 2.0  --      No results found for this or any previous visit (from the past 240 hour(s)).   Radiology Studies: VAS [place] LOWER EXTREMITY VENOUS (DVT)  Result Date: 04/15/2022  Lower Venous DVT Study Patient Name:  MATTHEW PAIS [facility]  Date of Exam:   04/15/2022 Medical Rec #: [id number]           Accession #:    [id number] Date of Birth: August 09, 1956          Patient Gender: F Patient Age:   58 years Exam Location:  [facility] Procedure:      VAS [place] LOWER EXTREMITY VENOUS (DVT) Referring Phys: Erasmo Downer CURCIO --------------------------------------------------------------------------------  Indications: Edema.  Risk Factors: Cancer. Limitations: Poor ultrasound/tissue interface. Comparison Study: No prior studies. Performing Technologist: Oliver Hum RVT  Examination Guidelines: A complete evaluation includes B-mode imaging, spectral Doppler, color Doppler, and power Doppler as needed of all accessible portions of each vessel. Bilateral testing is  considered an integral part of a complete examination. Limited examinations for reoccurring indications may be performed as noted. The reflux portion of the exam is performed with the patient in reverse Trendelenburg.  +---------+---------------+---------+-----------+----------+--------------+ RIGHT    CompressibilityPhasicitySpontaneityPropertiesThrombus Aging +---------+---------------+---------+-----------+----------+--------------+ CFV      Full           Yes      Yes                                 +---------+---------------+---------+-----------+----------+--------------+ SFJ      Full                                                        +---------+---------------+---------+-----------+----------+--------------+ FV Prox  Full                                                         +---------+---------------+---------+-----------+----------+--------------+ FV Mid   Full                                                        +---------+---------------+---------+-----------+----------+--------------+ FV DistalFull                                                        +---------+---------------+---------+-----------+----------+--------------+ PFV      Full                                                        +---------+---------------+---------+-----------+----------+--------------+ POP      Full           Yes      Yes                                 +---------+---------------+---------+-----------+----------+--------------+ PTV      Full                                                        +---------+---------------+---------+-----------+----------+--------------+ PERO     Full                                                        +---------+---------------+---------+-----------+----------+--------------+   +---------+---------------+---------+-----------+----------+--------------+ LEFT     CompressibilityPhasicitySpontaneityPropertiesThrombus Aging +---------+---------------+---------+-----------+----------+--------------+ CFV  Full           Yes      Yes                                 +---------+---------------+---------+-----------+----------+--------------+ SFJ      Full                                                        +---------+---------------+---------+-----------+----------+--------------+ FV Prox  Full                                                        +---------+---------------+---------+-----------+----------+--------------+ FV Mid   Full                                                        +---------+---------------+---------+-----------+----------+--------------+ FV DistalFull                                                         +---------+---------------+---------+-----------+----------+--------------+ PFV      Full                                                        +---------+---------------+---------+-----------+----------+--------------+ POP      Full           Yes      Yes                                 +---------+---------------+---------+-----------+----------+--------------+ PTV      Full                                                        +---------+---------------+---------+-----------+----------+--------------+ PERO     Full                                                        +---------+---------------+---------+-----------+----------+--------------+    Summary: RIGHT: - There is no evidence of deep vein thrombosis in the lower extremity.  - No cystic structure found in the popliteal fossa.  LEFT: - There is no evidence of deep vein thrombosis in the lower extremity.  - No cystic structure found in the popliteal fossa.  *See table(s) above for measurements and observations.  Preliminary     Scheduled Meds:  budesonide (PULMICORT) nebulizer solution  0.5 mg Nebulization BID   Chlorhexidine Gluconate Cloth  6 each Topical Daily   dexamethasone  4 mg Oral Daily   feeding supplement  1 Container Oral TID BM   heparin injection (subcutaneous)  5,000 Units Subcutaneous Q8H   HYDROmorphone   Intravenous Q4H   metoCLOPramide  5 mg Oral TID AC   mouth rinse  15 mL Mouth Rinse 4 times per day   oxyCODONE  15 mg Oral Q12H   polyethylene glycol  17 g Oral Daily   traZODone  50 mg Oral QHS   Continuous Infusions:  dextrose 5 % and 0.45% NaCl 75 mL/hr at 04/16/22 0820   octreotide (SANDOSTATIN) 500 mcg in sodium chloride 0.9 % 250 mL (2 mcg/mL) infusion 100 mcg/hr (04/16/22 0818)   potassium chloride 10 mEq (04/16/22 1025)     LOS: 14 days   Shelly Coss, MD [facility] P8/08/2022, 11:08 AM

## 2022-04-16 NOTE — Progress Notes (Signed)
Patient had pain throughout the night. PCA usage encouraged. PEG tube to gravity over night with output of 650 ml. Daughter present at bedside overnight. Patient's abdomen continues to be very distended.

## 2022-04-17 DIAGNOSIS — N179 Acute kidney failure, unspecified: Secondary | ICD-10-CM | POA: Diagnosis not present

## 2022-04-17 LAB — CBC
HCT: 26.2 % — ABNORMAL LOW (ref 36.0–46.0)
Hemoglobin: 8.2 g/dL — ABNORMAL LOW (ref 12.0–15.0)
MCH: 30 pg (ref 26.0–34.0)
MCHC: 31.3 g/dL (ref 30.0–36.0)
MCV: 96 fL (ref 80.0–100.0)
Platelets: 208 10*3/uL (ref 150–400)
RBC: 2.73 MIL/uL — ABNORMAL LOW (ref 3.87–5.11)
RDW: 15.5 % (ref 11.5–15.5)
WBC: 5 10*3/uL (ref 4.0–10.5)
nRBC: 0 % (ref 0.0–0.2)

## 2022-04-17 LAB — BASIC METABOLIC PANEL
Anion gap: 8 (ref 5–15)
BUN: 17 mg/dL (ref 8–23)
CO2: 31 mmol/L (ref 22–32)
Calcium: 7.3 mg/dL — ABNORMAL LOW (ref 8.9–10.3)
Chloride: 102 mmol/L (ref 98–111)
Creatinine, Ser: 1.34 mg/dL — ABNORMAL HIGH (ref 0.44–1.00)
GFR, Estimated: 43 mL/min — ABNORMAL LOW (ref >60)
Glucose, Bld: 264 mg/dL — ABNORMAL HIGH (ref 70–99)
Potassium: 3.1 mmol/L — ABNORMAL LOW (ref 3.5–5.1)
Sodium: 141 mmol/L (ref 135–145)

## 2022-04-17 LAB — MAGNESIUM: Magnesium: 1.7 mg/dL (ref 1.7–2.4)

## 2022-04-17 MED ORDER — SENNA 8.6 MG PO TABS
1.0000 | ORAL_TABLET | Freq: Two times a day (BID) | ORAL | Status: DC
  Administered 2022-04-18 – 2022-04-19 (×2): 8.6 mg via ORAL
  Filled 2022-04-17 (×3): qty 1

## 2022-04-17 MED ORDER — POTASSIUM CHLORIDE 10 MEQ/100ML IV SOLN
10.0000 meq | INTRAVENOUS | Status: AC
  Administered 2022-04-17 (×5): 10 meq via INTRAVENOUS
  Filled 2022-04-17 (×5): qty 100

## 2022-04-17 MED ORDER — MAGNESIUM SULFATE 2 GM/50ML IV SOLN
2.0000 g | Freq: Once | INTRAVENOUS | Status: AC
  Administered 2022-04-17: 2 g via INTRAVENOUS
  Filled 2022-04-17: qty 50

## 2022-04-17 NOTE — Progress Notes (Signed)
Palliative Medicine Progress Note   Patient Name: Mariah Schultz       Date: 04/17/2022 DOB: 10-12-1955  Age: 66 y.o. 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  She was admitted from the cancer center on 04/01/2022 with weakness, dehydration, and AKI.  CT abdomen pelvis on 04/04/2022 was consistent with small bowel obstruction.   Palliative medicine team consulted for pain and symptom management.  Subjective: I met today with patient, her husband at bedside.  Patient's daughter participated in the goals of care family meeting and CODE STATUS family meeting discussion on the phone and bedside RN also present, appreciate her assistance. Patient doing ostomy care, pump queried received total of 31.1 mg of Dilaudid 74 demands and 65 deliveries.  She is on bolus PCA of Dilaudid 0.5 mg available every 8 minutes.  See below.   Objective:  Physical Exam Vitals reviewed.  Constitutional:      General: She is not in acute distress.    Appearance: She is ill-appearing.  Pulmonary:     Effort: Pulmonary effort is normal.  Abdominal:     General: There is distension.  Neurological:     Mental Status: She is alert and oriented to person, place, and time.             Vital Signs: BP 136/68 (BP Location: Right Arm)   Pulse 75   Temp 97.9 F (36.6 C) (Oral)   Resp 16   Ht 5' 4" (1.626 m)   Wt  76.5 kg   SpO2 92%   BMI 28.96 kg/m  SpO2: SpO2: 92 % O2 Device: O2 Device: Nasal Cannula O2 Flow Rate: O2 Flow Rate (L/min): 2 L/min    LBM: Last BM Date :  (no output from ostomy)      Palliative Medicine Assessment & Plan   Assessment: Principal Problem:   AKI (acute kidney injury) ([place]) Active Problems:   Essential hypertension   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 06/22/2021   Colostomy present ([facility])   Hematuria  Hydronephrosis, right   Cancer related pain   Dehydration   Hyponatremia   Pyuria   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  Compared and contrasted full code versus DNR/DNI.  Patient elects for DNR/DNI. Discussed about disposition options.  Differences between home with palliative versus home with hospice explained in detail.  Patient is accepting of home with hospice support however she still wishes to follow-up with her oncologist 1 more time on 04-22-2022.  Request TOC consult for hospice of [facility] with hospice referral, patient lives in [place], [place]. Continue Oxycodone 15 mg every 12 hours   PMT will continue to support, appreciate oncology follow up.    Prognosis:  Guarded  Discharge Planning: Goal is home Patient and is now accepting of hospice support.  Thank you for allowing the Palliative Medicine Team to assist in the care of this patient.   Greater than 50%  of this time was spent counseling and coordinating care related to the above assessment and plan.  Mod MDM Loistine Chance MD Palliative Medicine   Please contact Palliative Medicine Team phone at [phone] for questions and concerns.  For individual provider, see AMION.

## 2022-04-17 NOTE — Progress Notes (Signed)
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 Patient was admitted from cancer center for weakness, dehydration and AKI per Dr. Libby Maw request.  .  Hospital course complicated by SBO possibly from pelvic mass.  General surgery was following, now signed off.  Status post venting PEG tube placement 8/4 . There was ongoing discussion about goals of care .  Palliative care closely following.  Currently on PCA pump.  Oncology also following.Hospital course remarkable for increasing pain, abdominal distention. Despite multiple goals of care discussions, recommendation for comfort care/hospice, patient wants to continue current management.  Very high risks of decompensation.  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  Also had severe pelvic pain coming from pelvic mass.  CT showed small bowel obstruction, likely the obstruction from pelvic mass.  General surgery consulted.  Discussed surgical option but  she was not a good candidate.  Status post venting G-tube placed by IR for palliative performance.  Plan was to discharge her with PCA pump. She previously had diverting colostomy for partial colonic obstruction. Patient is on dilaudid PCA, fentanyl patch, also on octreotide infusion.  Palliative care managing the pain. TPN held off.On full liquid diet. Her abdomen is distended and she complains of increase lower abdominal pain, no bowel meant for last 4-5 days.  CT abdomen/pelvis without contrast done on 8/12 did not show any significant new findings.  Squamous cell carcinoma of pelvis of unclear etiology: Poorly differentiated, high-grade carcinoma.  Concern for cervical cancer.  Oncology is following during this hospitalization.  She also has hydronephrosis secondary to pelvic mass. Oncology was planning to follow-up her as an outpatient for next treatment with immunotherapy but now recommending hospice approach/comfort care because patient is declining fast..  Sepsis secondary to UTI: Sepsis physiology has resolved.  Completed 5 days course of ceftriaxone  Normocytic anemia: Currently hemoglobin stable.  Given a unit of blood transfusion on 8/7.  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  Status post right-sided stent placement by urology on 7/18 for hydronephrosis secondary to pelvic mass  Hypertension: Currently BP stable  AKI: New problem.  Continue to monitor.  On gentle IV fluids.   Bilateral lower EXTR edema: Venous doppler negative for DVT   Hypokalemia: Supplemented with potassium  Goals of care: Extensive discussion done on goals of care regarding prognosis and recommendation of hospice/comfort care.  Palliaitve care discussed with the patient and decision was made to go to the [place] to DNR but as per the RN, patient decided to go back and become full code.  Very  poor prognosis, high risk for decompensation.  We will continue goals of care.         DVT prophylaxis:Place and maintain sequential compression device Start: 04/15/22 1819 heparin injection 5,000 Units Start: 04/05/22 0845 SCDs Start: 04/01/22 1753     Code Status: Full Code  Family Communication: family  at bedside  Patient status:Inpatient  Patient is from :Home  Anticipated discharge TI:WPYK  Estimated DC date: Not sure.  Patient is rapidly declining.  Consultants: palliative care, general surgery, IR  Procedures: Venting G-tube  Antimicrobials:  Anti-infectives (From admission, onward)    Start     Dose/Rate Route Frequency Ordered Stop   04/08/22 0600  ceFAZolin (ANCEF) IVPB 2g/100 mL premix        2 g 200 mL/hr over 30 Minutes Intravenous To Radiology 04/07/22 1618 04/08/22 0940   04/01/22 1930  cefTRIAXone (ROCEPHIN) 1 g in sodium chloride 0.9 % 100 mL IVPB        1 g 200 mL/hr over 30 Minutes Intravenous Every 24 hours 04/01/22 1830 04/05/22 2100       Subjective:  Patient seen and examined at the bedside this morning.  Husband at bedside.  Appears little comfortable today.  Sleeping during my arrival but wake up during my conversation.  She states she is still in pain but feels better than yesterday.  Objective: Vitals:   04/17/22 0404 04/17/22 0606 04/17/22 0733 04/17/22 0855  BP:  136/68    Pulse:  75    Resp: '16 16 16   '$ Temp:  97.9 F (36.6 C)    TempSrc:  Oral    SpO2:  97% 96% 96%  Weight:      Height:        Intake/Output Summary (Last 24 hours) at 04/17/2022 1046 Last data filed at 04/17/2022 0855 Gross per 24 hour  Intake --  Output 2000 ml  Net -2000 ml   Filed Weights   04/03/22 2024  Weight: 76.5 kg    Examination:   General exam: Very weak, deconditioned, chronically ill looking Respiratory system:  no wheezes or crackles  Cardiovascular system: S1 & S2 heard, RRR.  Gastrointestinal system: Abdomen is distended, has generalized tenderness, venting G-tube, empty colostomy bag Central nervous system: Alert  and oriented Extremities: Lateral lower extremity pitting edema, no clubbing ,no cyanosis Skin: No rashes, no ulcers,no icterus     Data Reviewed: I have personally reviewed following labs and imaging studies  CBC: Recent Labs  Lab 04/13/22 0448 04/16/22 0537 04/17/22 0506  WBC 4.2 4.9 5.0  HGB 8.2* 8.5* 8.2*  HCT 25.1* 26.6* 26.2*  MCV 92.6 94.7 96.0  PLT 279 246 998   Basic Metabolic Panel: Recent Labs  Lab 04/13/22 0448 04/16/22 0537 04/17/22 0506  NA 136 142 141  K 3.5 3.2* 3.1*  CL 98 98 102  CO2 30 33* 31  GLUCOSE 162* 149* 264*  BUN '14 12 17  '$ CREATININE 1.01* 1.31* 1.34*  CALCIUM 7.9* 8.2* 7.3*  MG 2.0  --   --      No results found for this or any previous visit (from the past 240 hour(s)).   Radiology Studies: CT ABDOMEN PELVIS WO CONTRAST  Result Date: 04/16/2022 CLINICAL DATA:  Acute abdominal pain. Acute kidney injury. Pelvic squamous cell carcinoma. * Tracking Code: BO * EXAM: CT ABDOMEN AND PELVIS WITHOUT CONTRAST TECHNIQUE: Multidetector CT imaging of the abdomen and pelvis was performed following the standard protocol without IV contrast. RADIATION DOSE REDUCTION: This  exam was performed according to the departmental dose-optimization program which includes automated exposure control, adjustment of the mA and/or kV according to patient size and/or use of iterative reconstruction technique. COMPARISON:  04/04/2022 FINDINGS: Lower chest: Stable bibasilar scarring. Decreased attenuation cardiac blood pool, consistent with anemia. Hepatobiliary: No mass visualized on this unenhanced exam. Gallbladder is unremarkable. No evidence of biliary ductal dilatation. Pancreas: No mass or inflammatory process visualized on this unenhanced exam. Spleen:  Within normal limits in size. Adrenals/Urinary tract: Moderate right hydronephrosis remains stable with right ureteral stent in appropriate position. Stomach/Bowel: Stable diverting loop colostomy in the left mid abdominal  wall. Percutaneous gastrostomy tube is seen in appropriate position. Moderate small bowel dilatation is again seen with transition point in the posterior pelvis at the site of the large pelvic soft tissue mass, consistent with small-bowel obstruction. Vascular/Lymphatic: No pathologically enlarged lymph nodes identified. No evidence of abdominal aortic aneurysm. Aortic atherosclerotic calcification incidentally noted. Reproductive: Large soft tissue mass is again seen in the mid posterior pelvis, measuring 11.4 x 9.3 cm. This shows no significant change compared to recent exam. This involves small bowel and rectosigmoid colon, as well as the distal right ureter. Other:  None. Musculoskeletal:  No suspicious bone lesions identified. IMPRESSION: No significant change in large soft tissue mass in the mid posterior pelvis. No significant change in distal small bowel obstruction which appears to be due to small bowel involvement by the large pelvic mass. Stable moderate right hydronephrosis due to involvement of the distal right ureter by the large pelvic mass. Right ureteral stent remains in appropriate position. Electronically Signed   By: Marlaine Hind M.D.   On: 04/16/2022 13:27   VAS [place] LOWER EXTREMITY VENOUS (DVT)  Result Date: 04/16/2022  Lower Venous DVT Study Patient Name:  Mariah Schultz [facility]  Date of Exam:   04/15/2022 Medical Rec #: [id number]           Accession #:    [id number] Date of Birth: 1955-09-17          Patient Gender: F Patient Age:   78 years Exam Location:  [facility] Procedure:      VAS [place] LOWER EXTREMITY VENOUS (DVT) Referring Phys: Erasmo Downer CURCIO --------------------------------------------------------------------------------  Indications: Edema.  Risk Factors: Cancer. Limitations: Poor ultrasound/tissue interface. Comparison Study: No prior studies. Performing Technologist: Oliver Hum RVT  Examination Guidelines: A complete evaluation includes B-mode imaging, spectral Doppler,  color Doppler, and power Doppler as needed of all accessible portions of each vessel. Bilateral testing is considered an integral part of a complete examination. Limited examinations for reoccurring indications may be performed as noted. The reflux portion of the exam is performed with the patient in reverse Trendelenburg.  +---------+---------------+---------+-----------+----------+--------------+ RIGHT    CompressibilityPhasicitySpontaneityPropertiesThrombus Aging +---------+---------------+---------+-----------+----------+--------------+ CFV      Full           Yes      Yes                                 +---------+---------------+---------+-----------+----------+--------------+ SFJ      Full                                                        +---------+---------------+---------+-----------+----------+--------------+ FV Prox  Full                                                        +---------+---------------+---------+-----------+----------+--------------+  FV Mid   Full                                                        +---------+---------------+---------+-----------+----------+--------------+ FV DistalFull                                                        +---------+---------------+---------+-----------+----------+--------------+ PFV      Full                                                        +---------+---------------+---------+-----------+----------+--------------+ POP      Full           Yes      Yes                                 +---------+---------------+---------+-----------+----------+--------------+ PTV      Full                                                        +---------+---------------+---------+-----------+----------+--------------+ PERO     Full                                                        +---------+---------------+---------+-----------+----------+--------------+    +---------+---------------+---------+-----------+----------+--------------+ LEFT     CompressibilityPhasicitySpontaneityPropertiesThrombus Aging +---------+---------------+---------+-----------+----------+--------------+ CFV      Full           Yes      Yes                                 +---------+---------------+---------+-----------+----------+--------------+ SFJ      Full                                                        +---------+---------------+---------+-----------+----------+--------------+ FV Prox  Full                                                        +---------+---------------+---------+-----------+----------+--------------+ FV Mid   Full                                                        +---------+---------------+---------+-----------+----------+--------------+   FV DistalFull                                                        +---------+---------------+---------+-----------+----------+--------------+ PFV      Full                                                        +---------+---------------+---------+-----------+----------+--------------+ POP      Full           Yes      Yes                                 +---------+---------------+---------+-----------+----------+--------------+ PTV      Full                                                        +---------+---------------+---------+-----------+----------+--------------+ PERO     Full                                                        +---------+---------------+---------+-----------+----------+--------------+     Summary: RIGHT: - There is no evidence of deep vein thrombosis in the lower extremity.  - No cystic structure found in the popliteal fossa.  LEFT: - There is no evidence of deep vein thrombosis in the lower extremity.  - No cystic structure found in the popliteal fossa.  *See table(s) above for measurements and observations. Electronically signed  by Orlie Pollen on 04/16/2022 at 12:31:07 PM.    Final     Scheduled Meds:  budesonide (PULMICORT) nebulizer solution  0.5 mg Nebulization BID   Chlorhexidine Gluconate Cloth  6 each Topical Daily   dexamethasone  4 mg Oral Daily   feeding supplement  1 Container Oral TID BM   heparin injection (subcutaneous)  5,000 Units Subcutaneous Q8H   HYDROmorphone   Intravenous Q4H   metoCLOPramide  5 mg Oral TID AC   mouth rinse  15 mL Mouth Rinse 4 times per day   oxyCODONE  15 mg Oral Q12H   polyethylene glycol  17 g Oral Daily   traZODone  50 mg Oral QHS   Continuous Infusions:  dextrose 5 % and 0.45% NaCl 75 mL/hr at 04/16/22 2212   octreotide (SANDOSTATIN) 500 mcg in sodium chloride 0.9 % 250 mL (2 mcg/mL) infusion 100 mcg/hr (04/17/22 0715)   potassium chloride 10 mEq (04/17/22 0956)     LOS: 15 days   Shelly Coss, MD [facility] P8/13/2023, 10:46 AM

## 2022-04-18 DIAGNOSIS — Z7189 Other specified counseling: Secondary | ICD-10-CM | POA: Diagnosis not present

## 2022-04-18 DIAGNOSIS — N179 Acute kidney failure, unspecified: Secondary | ICD-10-CM | POA: Diagnosis not present

## 2022-04-18 DIAGNOSIS — K56609 Unspecified intestinal obstruction, unspecified as to partial versus complete obstruction: Secondary | ICD-10-CM | POA: Diagnosis not present

## 2022-04-18 DIAGNOSIS — C4492 Squamous cell carcinoma of skin, unspecified: Secondary | ICD-10-CM | POA: Diagnosis not present

## 2022-04-18 LAB — BASIC METABOLIC PANEL
Anion gap: 7 (ref 5–15)
BUN: 22 mg/dL (ref 8–23)
CO2: 33 mmol/L — ABNORMAL HIGH (ref 22–32)
Calcium: 7.6 mg/dL — ABNORMAL LOW (ref 8.9–10.3)
Chloride: 99 mmol/L (ref 98–111)
Creatinine, Ser: 1.28 mg/dL — ABNORMAL HIGH (ref 0.44–1.00)
GFR, Estimated: 46 mL/min — ABNORMAL LOW (ref >60)
Glucose, Bld: 160 mg/dL — ABNORMAL HIGH (ref 70–99)
Potassium: 4 mmol/L (ref 3.5–5.1)
Sodium: 139 mmol/L (ref 135–145)

## 2022-04-18 LAB — CBC
HCT: 26.9 % — ABNORMAL LOW (ref 36.0–46.0)
Hemoglobin: 8.5 g/dL — ABNORMAL LOW (ref 12.0–15.0)
MCH: 30.4 pg (ref 26.0–34.0)
MCHC: 31.6 g/dL (ref 30.0–36.0)
MCV: 96.1 fL (ref 80.0–100.0)
Platelets: 198 10*3/uL (ref 150–400)
RBC: 2.8 MIL/uL — ABNORMAL LOW (ref 3.87–5.11)
RDW: 15.5 % (ref 11.5–15.5)
WBC: 5.5 10*3/uL (ref 4.0–10.5)
nRBC: 0 % (ref 0.0–0.2)

## 2022-04-18 MED ORDER — HYDROMORPHONE 1 MG/ML IV SOLN
INTRAVENOUS | Status: DC
  Administered 2022-04-18: 7.99 mg via INTRAVENOUS
  Administered 2022-04-19: 6.43 mg via INTRAVENOUS
  Administered 2022-04-19: 4.23 mg via INTRAVENOUS
  Administered 2022-04-19: 3.14 mg via INTRAVENOUS
  Administered 2022-04-19: 5.28 mg via INTRAVENOUS
  Administered 2022-04-19: 1.64 mg via INTRAVENOUS
  Filled 2022-04-18: qty 30

## 2022-04-18 NOTE — TOC Progression Note (Addendum)
Transition of Care [facility]) - Progression Note    Patient Details  Name: Mariah Schultz MRN: [id number] Date of Birth: Jun 15, 1956  Transition [facility]) CM/SW Contact  Mariah Schultz, [place] Phone Number: 04/18/2022, 11:49 AM  Clinical Narrative:     CSW was informed that patient and family have decided to go home with hospice now.  CSW spoke to patient's daughter Mariah Schultz, [phone] and discussed preferences for home hospice agencies.  Per daughter they would like [facility].    CSW spoke to [place] at [facility] she notified the hospital liasion who will call this CSW.  12:10pm  CSW received phone call from [place], [phone] [facility].  She said they should be able to do the PCA pump.  She asked for CSW to fax the referral to [phone] for her to review.  After she reviews, will call CSW back.  2:20pm  CSW received a phone call back from [place] at [facility].  They have agreed to accept patient and have spoken to the patient and family.  Plan is for discharge tomorrow as long as she is medically ready for discharge via EMS.    Dawn at [facility] spoke to daughter, they are requesting, oxygen, wheelchair, bedside commode, and Rolator. Per [facility] equipment will be delivered in the morning, and she said to have EMS transport set up after 12 pm, CSW updated patient's daughter [name].  CSW to continue to follow patient's progress throughout discharge planning.  3:45pm CSW received phone call from [facility] who said her medical director had some questions about some of the pain medications.  CSW asked Dr. Rowe Pavy from palliative medicine to speak to [facility].  Dr. Rowe Pavy spoke to [facility] and updated this CSW that family is ok with med changes.  CSW received a call back from [facility] again, and she said family and physician are agreeable to change of medication.  Patient plans to discharge tomorrow if  medically stable via EMS.  Dawn requested that bedside nurse contact the main office at [phone] once EMS arrives on Tuesday to transport patient back home.  CSW to continue to follow patient tomorrow.  Expected Discharge Plan: [place] Barriers to Discharge: Continued Medical Work up  Expected Discharge Plan and Services Expected Discharge Plan: [place] In-house Referral: Clinical Social Work   Post Acute Care Choice: Home Health                   DME Arranged: Oxygen, Nebulizer machine DME Agency: [employer] Date DME Agency Contacted: 04/13/22   Representative spoke with at DME Agency: Andee Poles HH Arranged: RN [facility] Agency: [place] Date [place]: 04/13/22   Representative spoke with at [place]: [place] ([place]) Interventions    Readmission Risk Interventions    04/03/2022    5:52 PM 04/03/2022    4:06 PM  Readmission Risk Prevention Plan  Transportation Screening Complete Complete  PCP or Specialist Appt within 5-7 Days Complete Complete  Home Care Screening Complete Complete  Medication Review (RN CM) Referral to Pharmacy Referral to Pharmacy

## 2022-04-18 NOTE — Progress Notes (Signed)
Chaplain engaged in an initial visit with Mariah Schultz, her daughter, grandson, and husband.  Chaplain learned that Mariah Schultz and Mariah Schultz have been married 30 years.  They met while living and working in [place] and both have retired from [employer].  Mariah Schultz is originally from [place], and Mariah Schultz is from [place]. They decided to settle back in [place] and buy property. 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  Daughter shared that her Mariah Schultz loves gardening.    Chaplain worked to provide space to learn about Mariah Schultz and check in on family as Mariah Schultz has decided to go into hospice care at home.  Family is doing as well as they can through this difficult time.  Chaplain let them know about spiritual care support services and that she would check in tomorrow before [facility] discharges.  Chaplain asked if family needed anything and they stated, "no."  Chaplain offered a compassionate presence and reflective listening.    04/18/22 1500  Clinical Encounter Type  Visited With Patient and family together  Visit Type Initial;Spiritual support  Referral From Palliative care team  Consult/Referral To Chaplain

## 2022-04-18 NOTE — Progress Notes (Signed)
Mobility Specialist - Progress Note   04/18/22 1042  Mobility  Activity Ambulated with assistance in hallway  Level of Assistance Standby assist, set-up cues, supervision of patient - no hands on  Assistive Device Front wheel walker  Distance Ambulated (ft) 200 ft  Activity Response Tolerated well  $Mobility charge 1 Mobility   Pt agreeable to mobilized, received in bed. No c/o nausea or pain. Pt left EOB with plans to use restroom with help from family in room and all needs met.   Roderick Pee Mobility Specialist

## 2022-04-18 NOTE — Progress Notes (Cosign Needed)
Hematology/Oncology Progress Note  Clinical Summary: Mrs. 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  Interval History: --On a full liquid diet-nursing clamps for about 1 hour after eating and then unclamped --Continues to have significant pain despite being on Dilaudid PCA.  Palliative care continues to follow and is adjusting medication. -- The patient has agreed to DNR/DNI and is agreeable to go home with hospice.  However, he remains invested in following up in our office as previously scheduled on 8/18 to discuss systemic treatment options.  O:  Vitals:   04/18/22 0437 04/18/22 0828  BP: (!) 142/72   Pulse: 69   Resp: 18   Temp: 98.9 F (37.2 C)   SpO2: 98% 98%      Latest Ref Rng & Units 04/18/2022    5:30 AM 04/17/2022    5:06 AM 04/16/2022    5:37 AM  CMP  Glucose 70 - 99 mg/dL 160  264  149   BUN 8 - 23 mg/dL '22  17  12   '$ Creatinine 0.44 - 1.00 mg/dL 1.28  1.34  1.31   Sodium 135 - 145 mmol/L 139  141  142   Potassium 3.5 - 5.1 mmol/L 4.0  3.1  3.2   Chloride 98 - 111 mmol/L 99  102  98   CO2 22 - 32 mmol/L 33  31  33   Calcium 8.9 - 10.3 mg/dL 7.6  7.3  8.2   Total Protein 6.5 - 8.1 g/dL   5.6   Total Bilirubin 0.3 - 1.2 mg/dL   0.5   Alkaline Phos 38 - 126 U/L   81   AST 15 - 41 U/L   10   ALT 0 - 44 U/L   11       Latest Ref Rng & Units 04/18/2022    5:30 AM 04/17/2022    5:06 AM 04/16/2022    5:37 AM  CBC  WBC 4.0 - 10.5 K/uL 5.5  5.0  4.9   Hemoglobin 12.0 - 15.0 g/dL 8.5  8.2  8.5   Hematocrit 36.0 - 46.0 % 26.9  26.2  26.6   Platelets 150 - 400 K/uL 198  208  246       GENERAL: Chronically ill-appearing elderly African-American female, in NAD  SKIN: skin color, texture, turgor are normal, no rashes or significant lesions EYES: conjunctiva are pink and non-injected, sclera clear LUNGS: clear  to auscultation and percussion with normal breathing effort HEART: regular rate & rhythm and no murmurs and no lower extremity edema Musculoskeletal: no cyanosis of digits and no clubbing  PSYCH: alert & oriented x 3, fluent speech NEURO: no focal motor/sensory deficits  Assessment/Plan: Mrs. 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  The patient had a venting G-tube placed as a surgical relief of the obstruction was not recommended.  Continues to have uncontrolled pain despite being on Dilaudid PCA.  Palliative care following.   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  Next treatment 04/22/2022. --No evidence of obstructive  stent, creatinine improving with hydration --Small bowel obstruction secondary to pelvic mass, surgery currently following and evaluating.  -- Venting G-tube in place and patient on a full liquid diet -- Currently on a PCA and palliative care is assisting with pain control.  Have been unsuccessful in getting her off of IV pain medication. --Appreciate the assistance of palliative care regarding discussions and supportive treatment moving forward.  The patient has now agreed to DNR/DNI and to go home with hospice.  However, she still wants to be seen in our office on 8/18 to discuss systemic treatment options. -- The patient asked again today about the difference between palliative care and hospice.  I have explained the differences.  I have also explained to the patient that we cannot administer chemotherapy or immunotherapy if she elects hospice.  It has previously been  explained to the patient by palliative care that she could enroll with hospice and then opted to disenroll with hospice and go on palliative care services in order to get treatment.  I have explained to the patient and her family that this is a possibility but then we are left in a difficult position with controlling her pain medication.  Palliative care and home health would not be able to manage a Dilaudid pump at home.  Hospice would need to be involved for management of this pain medication.  I expressed concern that she would likely have to be readmitted to the hospital for pain control if she revokes her hospice benefit. -- At this point, the patient will discharge to home with hospice.  She plans to keep her appointment on 8/18 in our office.   Mikey Bussing   ADDENDUM  .Patient was Personally and independently interviewed, examined and relevant elements of the history of present illness were reviewed in details and an assessment and plan was created. All elements of the patient's history of present illness , assessment and plan were discussed in details with Mikey Bussing DNP. The above documentation reflects our combined findings assessment and plan.  No other additional medical oncology recommendations at this time. Appreciate palliative care input to help define goals of care, for pain management and help with setting up home hospice with [facility]. Patient and some family members were intially wanted to have additional medical oncology f/u with Dr Lorenso Courier on discuss. Will have Dr Elbert Ewings RN f/u with patient/family to determine if they are still inclined to f/u in clinic to try to limit care burden for the patient.  Sullivan Lone MD MS

## 2022-04-18 NOTE — Progress Notes (Signed)
Palliative Medicine Progress Note   Patient Name: Mariah Schultz       Date: 04/18/2022 DOB: 30-Sep-1955  Age: 66 y.o. 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  She was admitted from the cancer center on 04/01/2022 with weakness, dehydration, and AKI.  CT abdomen pelvis on 04/04/2022 was consistent with small bowel obstruction.   Palliative medicine team consulted for pain and symptom management.  Subjective: I met today with patient, her husband and daughter at bedside.  Patient's pain is well controlled on current settings. Some mucus in her ostomy bag. Ambulating to bathroom. Tolerating liquids. We re discussed goals of care and disposition options. Also discussed with oncology colleague Ms Mena Pauls, appreciate med onc follow up.     See below.   Objective:  Physical Exam Vitals reviewed.  Constitutional:      General: She is not in acute distress. Pulmonary:     Effort: Pulmonary effort is normal.  Abdominal:     General: There is distension.  Neurological:     Mental Status: She is alert and oriented to person, place, and time.             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 Today, we again discussed about differences between home with palliative versus home with hospice. We talked about hospice philosophy of care and pain management options. TOC consulted, [facility] notified. Patient wishes to discuss further with oncology as well as with her family today regarding her wishes, she wants to be home and is accepting of hospice support at this time.  Continue Oxycodone 15 mg every 12 hours   PMT will continue to support, appreciate oncology follow up.    Prognosis:  Guarded  Discharge Planning: Goal is home Patient and is now accepting of hospice support.  Thank you for allowing the Palliative Medicine Team to assist in the care of this patient.   Greater than 50%  of this time was spent counseling and coordinating care related to the above assessment and plan.  High MDM Loistine Chance MD Palliative Medicine   Please contact Palliative Medicine Team phone at [phone] for questions and concerns.  For individual provider, see AMION.

## 2022-04-18 NOTE — Progress Notes (Signed)
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 Patient was admitted from cancer center for weakness, dehydration and AKI per Dr. Libby Maw request.  .  Hospital course complicated by SBO possibly from pelvic mass.  General surgery was following, now signed off.  Status post venting PEG tube placement 8/4 . There was ongoing discussion about goals of care .  Palliative care closely following.  Currently on PCA pump.  Oncology also following.Hospital course remarkable for increasing pain, abdominal distention. Despite multiple goals of care discussions, recommendation for comfort care/hospice, patient wants to continue current management.  Very high risks of decompensation. Patient has agreed for home with hospice.  Plan for discharge to home tomorrow if pain remains well controlled.  Assessment & Plan:  Principal Problem:   AKI (acute kidney injury) ([place]) Active Problems:   Essential hypertension   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 06/22/2021   Colostomy present ([place])   Hematuria   Hydronephrosis, right   Cancer related pain   Dehydration   Hyponatremia   Pyuria   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   Generalized weakness   Palliative care patient  Abdominal distention/abdominal pain: Secondary to small bowel obstruction from malignancy.  Also had severe pelvic pain coming from pelvic mass.  CT showed small bowel  obstruction, likely the obstruction from pelvic mass.  General surgery consulted.  Discussed surgical option but she was not a good candidate.  Status post venting G-tube placed by IR for palliative performance.  Plan was to discharge her with PCA pump. She previously had diverting colostomy for partial colonic obstruction. Patient is on dilaudid PCA, fentanyl patch, also on octreotide infusion.  Palliative care managing the pain. TPN held off.On full liquid diet. Her abdomen is distended and she complains of increase lower abdominal pain, no bowel meant for last 4-5 days.  CT abdomen/pelvis without contrast done on 8/12 did not show any significant new findings.  Squamous cell carcinoma of pelvis of unclear etiology: Poorly differentiated, high-grade carcinoma.  Concern for cervical cancer.  Oncology is following during this hospitalization.  She also has hydronephrosis secondary to pelvic mass. Oncology was planning to follow-up her as an outpatient for next treatment with immunotherapy but now recommending hospice approach/comfort care because patient is declining fast..  Sepsis secondary to UTI: Sepsis physiology has resolved.  Completed 5 days course of ceftriaxone  Normocytic anemia: Currently hemoglobin stable.  Given a unit of blood transfusion on 8/7.  Anemia is associated with malignancy  AKI on CKD stage IIIa: Stable.  Status post right-sided stent placement by urology on 7/18 for hydronephrosis secondary to pelvic mass  Hypertension: Currently BP stable  Bilateral lower EXTR edema: Venous doppler negative for DVT   Goals of care: Extensive discussion done on goals of care regarding prognosis and recommendation of hospice/comfort care.  Palliaitve care discussed with the patient and decision was made to go to the [place] to DNR and patient has agreed for home with hospice.  Very poor prognosis, high risk for decompensation.  DVT prophylaxis:Place and maintain sequential  compression device Start: 04/15/22 1819 heparin injection 5,000 Units Start: 04/05/22 0845 SCDs Start: 04/01/22 1753     Code Status: DNR  Family Communication: daughter   at bedside  Patient status:Inpatient  Patient is from :Home  Anticipated discharge [id number]  Estimated DC date: tomorrow  Consultants: palliative care, general surgery, IR  Procedures: Venting G-tube  Antimicrobials:  Anti-infectives (From admission, onward)    Start     Dose/Rate Route Frequency Ordered Stop   04/08/22 0600  ceFAZolin (ANCEF) IVPB 2g/100 mL premix        2 g 200 mL/hr over 30 Minutes Intravenous To Radiology 04/07/22 1618 04/08/22 0940   04/01/22 1930  cefTRIAXone (ROCEPHIN) 1 g in sodium chloride 0.9 % 100 mL IVPB        1 g 200 mL/hr over 30 Minutes Intravenous Every 24 hours 04/01/22 1830 04/05/22 2100       Subjective:  Patient seen and examined at the bedside this morning.  She looks more comfortable than yesterday.  Denies any worsening abdominal pain.  The venting G-tube is draining the secretions.  She was sitting on the bed.  She wants to go home and follow-up with hospice/palliative care.  Objective: Vitals:   04/17/22 2041 04/18/22 0348 04/18/22 0437 04/18/22 0828  BP: (!) 159/70  (!) 142/72   Pulse: 79  69   Resp: 18  18   Temp: 98.4 F (36.9 C)  98.9 F (37.2 C)   TempSrc: Oral  Oral   SpO2: 100% 96% 98% 98%  Weight:      Height:        Intake/Output Summary (Last 24 hours) at 04/18/2022 1057 Last data filed at 04/18/2022 0920 Gross per 24 hour  Intake 5101.86 ml  Output 1600 ml  Net 3501.86 ml   Filed Weights   04/03/22 2024  Weight: 76.5 kg    Examination:   General exam: Deconditioned, weak, not in apparent distress HEENT: PERRL Respiratory system:  no wheezes or crackles  Cardiovascular system: S1 & S2 heard, RRR.  Gastrointestinal system: Abdomen is distant..  Colostomy, venting G-tube Central nervous system: Alert and oriented Extremities:  Bilateral lower extremity pitting edema, no clubbing ,no cyanosis Skin: No rashes, no ulcers,no icterus     Data Reviewed: I have personally reviewed following labs and imaging studies  CBC: Recent Labs  Lab 04/13/22 0448 04/16/22 0537 04/17/22 0506 04/18/22 0530  WBC 4.2 4.9 5.0 5.5  HGB 8.2* 8.5* 8.2* 8.5*  HCT 25.1* 26.6* 26.2* 26.9*  MCV 92.6 94.7 96.0 96.1  PLT 279 246 208 323   Basic Metabolic Panel: Recent Labs  Lab 04/13/22 0448 04/16/22 0537 04/17/22 0506 04/17/22 1048 04/18/22 0530  NA 136 142 141  --  139  K 3.5 3.2* 3.1*  --  4.0  CL 98 98 102  --  99  CO2 30 33* 31  --  33*  GLUCOSE 162* 149* 264*  --  160*  BUN '14 12 17  '$ --  22  CREATININE 1.01* 1.31* 1.34*  --  1.28*  CALCIUM 7.9* 8.2* 7.3*  --  7.6*  MG 2.0  --   --  1.7  --      No results found for this or any previous visit (from the past 240 hour(s)).   Radiology Studies: CT ABDOMEN PELVIS WO CONTRAST  Result Date: 04/16/2022 CLINICAL DATA:  Acute abdominal pain. Acute kidney injury. Pelvic squamous cell carcinoma. * Tracking Code:  BO * EXAM: CT ABDOMEN AND PELVIS WITHOUT CONTRAST TECHNIQUE: Multidetector CT imaging of the abdomen and pelvis was performed following the standard protocol without IV contrast. RADIATION DOSE REDUCTION: This exam was performed according to the departmental dose-optimization program which includes automated exposure control, adjustment of the mA and/or kV according to patient size and/or use of iterative reconstruction technique. COMPARISON:  04/04/2022 FINDINGS: Lower chest: Stable bibasilar scarring. Decreased attenuation cardiac blood pool, consistent with anemia. Hepatobiliary: No mass visualized on this unenhanced exam. Gallbladder is unremarkable. No evidence of biliary ductal dilatation. Pancreas: No mass or inflammatory process visualized on this unenhanced exam. Spleen:  Within normal limits in size. Adrenals/Urinary tract: Moderate right hydronephrosis remains  stable with right ureteral stent in appropriate position. Stomach/Bowel: Stable diverting loop colostomy in the left mid abdominal wall. Percutaneous gastrostomy tube is seen in appropriate position. Moderate small bowel dilatation is again seen with transition point in the posterior pelvis at the site of the large pelvic soft tissue mass, consistent with small-bowel obstruction. Vascular/Lymphatic: No pathologically enlarged lymph nodes identified. No evidence of abdominal aortic aneurysm. Aortic atherosclerotic calcification incidentally noted. Reproductive: Large soft tissue mass is again seen in the mid posterior pelvis, measuring 11.4 x 9.3 cm. This shows no significant change compared to recent exam. This involves small bowel and rectosigmoid colon, as well as the distal right ureter. Other:  None. Musculoskeletal:  No suspicious bone lesions identified. IMPRESSION: No significant change in large soft tissue mass in the mid posterior pelvis. No significant change in distal small bowel obstruction which appears to be due to small bowel involvement by the large pelvic mass. Stable moderate right hydronephrosis due to involvement of the distal right ureter by the large pelvic mass. Right ureteral stent remains in appropriate position. Electronically Signed   By: Marlaine Hind M.D.   On: 04/16/2022 13:27    Scheduled Meds:  budesonide (PULMICORT) nebulizer solution  0.5 mg Nebulization BID   Chlorhexidine Gluconate Cloth  6 each Topical Daily   dexamethasone  4 mg Oral Daily   feeding supplement  1 Container Oral TID BM   heparin injection (subcutaneous)  5,000 Units Subcutaneous Q8H   HYDROmorphone   Intravenous Q4H   metoCLOPramide  5 mg Oral TID AC   mouth rinse  15 mL Mouth Rinse 4 times per day   oxyCODONE  15 mg Oral Q12H   senna  1 tablet Oral BID   traZODone  50 mg Oral QHS   Continuous Infusions:  dextrose 5 % and 0.45% NaCl 75 mL/hr at 04/18/22 0811   octreotide (SANDOSTATIN) 500 mcg in  sodium chloride 0.9 % 250 mL (2 mcg/mL) infusion 100 mcg/hr (04/18/22 0917)     LOS: 16 days   Shelly Coss, MD [facility] P8/14/2023, 10:57 AM

## 2022-04-19 ENCOUNTER — Encounter: Payer: Self-pay | Admitting: Hematology and Oncology

## 2022-04-19 ENCOUNTER — Other Ambulatory Visit (HOSPITAL_COMMUNITY): Payer: Self-pay

## 2022-04-19 DIAGNOSIS — Z7189 Other specified counseling: Secondary | ICD-10-CM

## 2022-04-19 DIAGNOSIS — N179 Acute kidney failure, unspecified: Secondary | ICD-10-CM | POA: Diagnosis not present

## 2022-04-19 DIAGNOSIS — Z515 Encounter for palliative care: Secondary | ICD-10-CM | POA: Diagnosis not present

## 2022-04-19 MED ORDER — TRAZODONE HCL 50 MG PO TABS
50.0000 mg | ORAL_TABLET | Freq: Every day | ORAL | 0 refills | Status: AC
  Filled 2022-04-19: qty 30, 30d supply, fill #0

## 2022-04-19 MED ORDER — HYDROMORPHONE HCL 2 MG PO TABS
2.0000 mg | ORAL_TABLET | Freq: Once | ORAL | Status: AC
  Administered 2022-04-19: 2 mg via ORAL
  Filled 2022-04-19: qty 1

## 2022-04-19 MED ORDER — DEXAMETHASONE 4 MG PO TABS
4.0000 mg | ORAL_TABLET | Freq: Every day | ORAL | 0 refills | Status: AC
  Filled 2022-04-19: qty 30, 30d supply, fill #0

## 2022-04-19 MED ORDER — ONDANSETRON HCL 8 MG PO TABS
8.0000 mg | ORAL_TABLET | Freq: Three times a day (TID) | ORAL | 0 refills | Status: AC | PRN
  Filled 2022-04-19: qty 30, 10d supply, fill #0

## 2022-04-19 MED ORDER — HYDROMORPHONE HCL 1 MG/ML IJ SOLN
0.5000 mg | Freq: Once | INTRAMUSCULAR | Status: AC
  Administered 2022-04-19: 0.5 mg via INTRAVENOUS

## 2022-04-19 MED ORDER — METOCLOPRAMIDE HCL 5 MG PO TABS
5.0000 mg | ORAL_TABLET | Freq: Three times a day (TID) | ORAL | 0 refills | Status: AC
  Filled 2022-04-19: qty 90, 30d supply, fill #0

## 2022-04-19 NOTE — Discharge Summary (Signed)
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 Patient was admitted from cancer center for weakness, dehydration and AKI per Dr. Libby Maw request.  .  Hospital course complicated by SBO possibly from pelvic mass.  General surgery was following, now signed off.  Status post venting PEG tube placement 8/4 .  Palliative care closely following.  Currently on PCA pump.  After extensive discussion on goals of care, CODE STATUS changed to DNR, decision made to discharge the patient on hospice at home.  Following problems were addressed during her hospitalization:     Abdominal distention/abdominal pain: Secondary to small bowel obstruction from malignancy.  Also had severe pelvic pain coming from pelvic mass.  CT showed small bowel obstruction, likely the obstruction from pelvic mass.  General surgery consulted.  Discussed surgical option but she was not a good candidate.  Status post venting G-tube placed by IR for palliative performance.  Plan is to discharge her with PCA pump. She previously had diverting colostomy for partial colonic obstruction. CT abdomen/pelvis without contrast done on 8/12 did not show any significant new findings.   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  Concern for cervical cancer.  Oncology was following during this hospitalization.  She also has hydronephrosis secondary to pelvic mass. Very poor prognosis.  Patient will be discharged home with hospice.    Sepsis secondary to UTI: Sepsis physiology has resolved.  Completed 5 days course of ceftriaxone   Normocytic anemia: Currently hemoglobin stable.  Given a unit of blood transfusion on 8/7.  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  Status post right-sided stent placement by urology on 7/18 for hydronephrosis secondary to pelvic mass   Hypertension: Currently BP stable   Bilateral lower EXTR edema: Venous doppler negative for DVT    Goals of care: Extensive discussion done on goals of care regarding prognosis and recommendation of hospice/comfort care.  Palliaitve care discussed with the patient and decision was made to go to the [place] to DNR and patient has agreed for home with hospice.  Very poor prognosis, high risk for decompensation.         Discharge Diagnoses:  Principal Problem:   AKI (acute kidney injury) ([place]) Active Problems:   Essential hypertension   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 06/22/2021   Colostomy present ([place])   Hematuria   Hydronephrosis, right   Cancer related pain   Dehydration   Hyponatremia   Pyuria   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   Generalized weakness   Palliative care patient    Discharge Instructions  Discharge Instructions     Diet general   Complete by: As directed    Full liquid diet   Discharge instructions   Complete by: As directed    1)Please take prescribed medications as instructed  2)Follow up with Hospice at home   Increase activity slowly   Complete by: As directed       Allergies as of 04/19/2022       Reactions   Other Swelling   pneumonia vaccine    Pneumococcal Vac Polyvalent Swelling        Medication List     STOP taking these medications    Oxycodone HCl 10 MG Tabs   polyethylene glycol 17 g packet Commonly known as: MIRALAX / GLYCOLAX   prochlorperazine 10 MG tablet Commonly known as: COMPAZINE   sulfamethoxazole-trimethoprim 800-160 MG tablet Commonly known as: BACTRIM DS   valsartan-hydrochlorothiazide 160-12.5 MG tablet Commonly known as: DIOVAN-HCT   Xtampza ER 9 MG C12a Generic drug: oxyCODONE ER       TAKE these medications    acetaminophen 500 MG tablet Commonly known as: TYLENOL Take 500 mg by mouth 3 (three) times daily as needed (pain).   ascorbic acid 500 MG tablet Commonly known as: VITAMIN C Take 500 mg by mouth daily.   dexamethasone 4 MG tablet Commonly known as: DECADRON Take 1 tablet (4 mg total) by mouth daily.   FISH OIL PO Take 1 capsule by mouth daily.   lidocaine-prilocaine cream Commonly known as: EMLA Apply 1 application topically as needed. What changed: reasons to take this   metoCLOPramide 5 MG tablet Commonly known as: REGLAN Take 1 tablet (5 mg total) by mouth 3 (three) times daily before meals.   multivitamin tablet Take 1 tablet by mouth daily.   ondansetron 8 MG tablet Commonly known as: ZOFRAN Take 1 tablet (8 mg total) by mouth every 8 (eight) hours as needed. What changed:  when to take this reasons to take this   traZODone 50 MG tablet Commonly known as: DESYREL Take 1 tablet (50 mg total) by mouth at bedtime.               Durable Medical Equipment  (From admission, onward)           Start     Ordered   04/13/22 1136  For home use only DME Nebulizer machine  Once       Question Answer Comment  Patient needs a nebulizer to treat with the following condition Acute respiratory failure with hypoxia ([place])   Length of Need Lifetime      04/13/22 1135   04/12/22 1719  For home use only DME oxygen  Once       Comments: High grade  squamous cell carcinoma  Question Answer Comment  Length of Need 6 Months   Mode or (Route) Nasal cannula   Liters per Minute 2   Frequency Continuous (stationary and portable oxygen unit needed)   Oxygen delivery system Gas      04/12/22 1719            Allergies  Allergen Reactions   Other Swelling    pneumonia vaccine   Pneumococcal Vac Polyvalent Swelling    Consultations: Palliative care, surgery, oncology   Procedures/Studies: CT ABDOMEN PELVIS WO CONTRAST  Result Date: 04/16/2022 CLINICAL DATA:  Acute abdominal pain. Acute kidney injury. Pelvic squamous cell carcinoma. * Tracking Code: BO * EXAM: CT ABDOMEN AND PELVIS WITHOUT CONTRAST TECHNIQUE: Multidetector CT imaging of the abdomen and pelvis was performed following the standard protocol without IV contrast. RADIATION DOSE REDUCTION: This exam was performed according to the departmental dose-optimization program which includes automated exposure control, adjustment of the mA and/or kV according to patient  size and/or use of iterative reconstruction technique. COMPARISON:  04/04/2022 FINDINGS: Lower chest: Stable bibasilar scarring. Decreased attenuation cardiac blood pool, consistent with anemia. Hepatobiliary: No mass visualized on this unenhanced exam. Gallbladder is unremarkable. No evidence of biliary ductal dilatation. Pancreas: No mass or inflammatory process visualized on this unenhanced exam. Spleen:  Within normal limits in size. Adrenals/Urinary tract: Moderate right hydronephrosis remains stable with right ureteral stent in appropriate position. Stomach/Bowel: Stable diverting loop colostomy in the left mid abdominal wall. Percutaneous gastrostomy tube is seen in appropriate position. Moderate small bowel dilatation is again seen with transition point in the posterior pelvis at the site of the large pelvic soft tissue mass, consistent with small-bowel obstruction. Vascular/Lymphatic: No pathologically enlarged lymph  nodes identified. No evidence of abdominal aortic aneurysm. Aortic atherosclerotic calcification incidentally noted. Reproductive: Large soft tissue mass is again seen in the mid posterior pelvis, measuring 11.4 x 9.3 cm. This shows no significant change compared to recent exam. This involves small bowel and rectosigmoid colon, as well as the distal right ureter. Other:  None. Musculoskeletal:  No suspicious bone lesions identified. IMPRESSION: No significant change in large soft tissue mass in the mid posterior pelvis. No significant change in distal small bowel obstruction which appears to be due to small bowel involvement by the large pelvic mass. Stable moderate right hydronephrosis due to involvement of the distal right ureter by the large pelvic mass. Right ureteral stent remains in appropriate position. Electronically Signed   By: Marlaine Hind M.D.   On: 04/16/2022 13:27   VAS [place] LOWER EXTREMITY VENOUS (DVT)  Result Date: 04/16/2022  Lower Venous DVT Study Patient Name:  FLOR WHITACRE [facility]  Date of Exam:   04/15/2022 Medical Rec #: [id number]           Accession #:    [id number] Date of Birth: 10-08-1955          Patient Gender: F Patient Age:   63 years Exam Location:  [facility] Procedure:      VAS [place] LOWER EXTREMITY VENOUS (DVT) Referring Phys: Erasmo Downer CURCIO --------------------------------------------------------------------------------  Indications: Edema.  Risk Factors: Cancer. Limitations: Poor ultrasound/tissue interface. Comparison Study: No prior studies. Performing Technologist: Oliver Hum RVT  Examination Guidelines: A complete evaluation includes B-mode imaging, spectral Doppler, color Doppler, and power Doppler as needed of all accessible portions of each vessel. Bilateral testing is considered an integral part of a complete examination. Limited examinations for reoccurring indications may be performed as noted. The reflux portion of the exam is performed with the patient in  reverse Trendelenburg.  +---------+---------------+---------+-----------+----------+--------------+ RIGHT    CompressibilityPhasicitySpontaneityPropertiesThrombus Aging +---------+---------------+---------+-----------+----------+--------------+ CFV      Full           Yes      Yes                                 +---------+---------------+---------+-----------+----------+--------------+ SFJ      Full                                                        +---------+---------------+---------+-----------+----------+--------------+ FV Prox  Full                                                        +---------+---------------+---------+-----------+----------+--------------+  FV Mid   Full                                                        +---------+---------------+---------+-----------+----------+--------------+ FV DistalFull                                                        +---------+---------------+---------+-----------+----------+--------------+ PFV      Full                                                        +---------+---------------+---------+-----------+----------+--------------+ POP      Full           Yes      Yes                                 +---------+---------------+---------+-----------+----------+--------------+ PTV      Full                                                        +---------+---------------+---------+-----------+----------+--------------+ PERO     Full                                                        +---------+---------------+---------+-----------+----------+--------------+   +---------+---------------+---------+-----------+----------+--------------+ LEFT     CompressibilityPhasicitySpontaneityPropertiesThrombus Aging +---------+---------------+---------+-----------+----------+--------------+ CFV      Full           Yes      Yes                                  +---------+---------------+---------+-----------+----------+--------------+ SFJ      Full                                                        +---------+---------------+---------+-----------+----------+--------------+ FV Prox  Full                                                        +---------+---------------+---------+-----------+----------+--------------+ FV Mid   Full                                                        +---------+---------------+---------+-----------+----------+--------------+  FV DistalFull                                                        +---------+---------------+---------+-----------+----------+--------------+ PFV      Full                                                        +---------+---------------+---------+-----------+----------+--------------+ POP      Full           Yes      Yes                                 +---------+---------------+---------+-----------+----------+--------------+ PTV      Full                                                        +---------+---------------+---------+-----------+----------+--------------+ PERO     Full                                                        +---------+---------------+---------+-----------+----------+--------------+     Summary: RIGHT: - There is no evidence of deep vein thrombosis in the lower extremity.  - No cystic structure found in the popliteal fossa.  LEFT: - There is no evidence of deep vein thrombosis in the lower extremity.  - No cystic structure found in the popliteal fossa.  *See table(s) above for measurements and observations. Electronically signed by Orlie Pollen on 04/16/2022 at 12:31:07 PM.    Final    [place] EKG SITE RITE  Result Date: 04/12/2022 If Site Rite image not attached, placement could not be confirmed due to current cardiac rhythm.  IR GASTROSTOMY TUBE MOD SED  Result Date: 04/08/2022 INDICATION: 66 year old with squamous cell  carcinoma in the pelvis and bowel obstruction. Patient is not a good surgical candidate and plan for a venting gastrostomy tube. EXAM: PERCUTANEOUS GASTROSTOMY TUBE WITH FLUOROSCOPIC GUIDANCE Physician: Stephan Minister. Anselm Pancoast, MD MEDICATIONS: Angiogram ANESTHESIA/SEDATION: Moderate (conscious) sedation was employed during this procedure. A total of Versed 2.'0mg'$  and fentanyl 100 mcg was administered intravenously at the order of the provider performing the procedure. Total intra-service moderate sedation time: 20 minutes. Patient's level of consciousness and vital signs were monitored continuously by radiology nurse throughout the procedure under the supervision of the provider performing the procedure. FLUOROSCOPY: Radiation Exposure Index (as provided by the fluoroscopic device): 761 mGy Kerma COMPLICATIONS: None immediate. PROCEDURE: The procedure was explained to the patient. The risks and benefits of the procedure were discussed and the patient's questions were addressed. Informed consent was obtained from the patient. The patient was placed on the interventional table. An orogastric tube was placed with fluoroscopic guidance. The anterior abdomen was prepped and draped in sterile fashion. Maximal barrier sterile technique was utilized including caps, mask,  sterile gowns, sterile gloves, sterile drape, hand hygiene and skin antiseptic. Stomach was inflated with air through the orogastric tube. The skin and subcutaneous tissues were anesthetized with 1% lidocaine. A 17 gauge needle was directed into the distended stomach with fluoroscopic guidance. A wire was advanced into the stomach and a T-tact was deployed. A 9-French vascular sheath was placed and the orogastric tube was snared using a [place] snare device. The orogastric tube and snare were pulled out of the patient's mouth. The snare device was connected to a 20-French gastrostomy tube. The snare device and gastrostomy tube were pulled through the patient's mouth  and out the anterior abdominal wall. The gastrostomy tube was cut to an appropriate length. Contrast injection through gastrostomy tube confirmed placement within the stomach. Fluoroscopic images were obtained for documentation. The gastrostomy tube was flushed with normal saline. IMPRESSION: Successful fluoroscopic guided percutaneous gastrostomy tube placement. Electronically Signed   By: Markus Daft M.D.   On: 04/08/2022 11:54   DG Abd 1 View  Result Date: 04/07/2022 CLINICAL DATA:  Abdominal distension. EXAM: ABDOMEN - 1 VIEW COMPARISON:  Prior radiographs 04/04/2022 and 04/05/2022. Prior CT scan 04/04/2022 FINDINGS: The NG tube is been removed. The right sided double-J ureteral catheter remains in good position. Persistent air distended bowel. No obvious free air. Patient has a left lower quadrant colostomy. IMPRESSION: 1. Persistent air distended bowel. 2. Removal of NG tube. Electronically Signed   By: Marijo Sanes M.D.   On: 04/07/2022 08:28   DG Abd Portable 1V-Small Bowel Obstruction Protocol-initial, 8 hr delay  Result Date: 04/05/2022 CLINICAL DATA:  Small-bowel protocol.  8 hour film. EXAM: PORTABLE ABDOMEN - 1 VIEW COMPARISON:  04/04/2022 FINDINGS: Nasogastric tube tip in the stomach. Nasogastric tube side hole above the diaphragm as seen previously. Double-J ureteral stent on the right. Dilated fluid and air-filled small bowel again demonstrated. Presumably administered oral contrast is poorly demonstrated, probably diffuse within fluid-filled small bowel. Findings are consistent with ongoing small bowel obstruction. IMPRESSION: Administered contrast poorly demonstrated, probably diffused within fluid distended small bowel, consistent with ongoing small bowel obstruction. Electronically Signed   By: Nelson Chimes M.D.   On: 04/05/2022 08:03   DG Abd Portable 1V-Small Bowel Protocol-Position Verification  Result Date: 04/04/2022 CLINICAL DATA:  Nasogastric placement EXAM: PORTABLE ABDOMEN - 1  VIEW COMPARISON:  CT same day FINDINGS: Nasogastric tube tip enters the stomach. Side hole remains above the diaphragm. IMPRESSION: Nasogastric tube tip enters the stomach. Side hole remains above the diaphragm. Electronically Signed   By: Nelson Chimes M.D.   On: 04/04/2022 17:02   CT ABDOMEN PELVIS WO CONTRAST  Result Date: 04/04/2022 CLINICAL DATA:  Follow-up suspected bowel obstruction. 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 EXAM: CT ABDOMEN AND PELVIS WITHOUT CONTRAST TECHNIQUE: Multidetector CT imaging of the abdomen and pelvis was performed following the standard protocol without IV contrast. RADIATION DOSE REDUCTION: This exam was performed according to the departmental dose-optimization program which includes automated exposure control, adjustment of the mA and/or kV according to patient size and/or use of iterative reconstruction technique. COMPARISON:  03/20/2022 FINDINGS: Lower chest: Scarring versus platelike atelectasis is identified within both lung bases. No pleural effusion or airspace consolidation. Hepatobiliary: No suspicious liver lesion identified. Gallbladder appears normal. No bile duct dilatation. Pancreas: Unremarkable. No pancreatic ductal dilatation or surrounding inflammatory changes. Spleen: Normal in size without focal abnormality. Adrenals/Urinary Tract: Normal adrenal glands. Left kidney appears normal. Right-sided nephroureteral stent is  been placed since the previous exam. Persistent right hydronephrosis is identified, not significantly changed from the previous exam. No left-sided hydronephrosis. The distal end of the right nephroureteral stent is in the expected location of the right UVJ. Bladder is otherwise unremarkable. Stomach/Bowel: There is diffuse distension of the gastric limb. Multiple dilated loops of small bowel with air-fluid levels are identified. The small bowel loops measure up to 3.5 cm.  Findings are consistent with small bowel obstruction. The transition point is in the pelvis along the anterior margin of known pelvic mass, image 71/2 and coronal image 83/3. There is a left sided descending colostomy with normal caliber colon up to the ostomy site. The sigmoid colon and rectum appears encased by the pelvic mass. Vascular/Lymphatic: Aortic atherosclerosis. No abdominal adenopathy. No enlarged pelvic or inguinal lymph nodes. Reproductive: The uterus is not visualized separate from pelvic soft tissue mass. Other: The soft tissue mass within the pelvis is difficult to separate from the adjacent unopacified pelvic bowel loops. This measures approximately 10.6 x 7.7 cm, image 70/2. Not significantly changed from previous exam. No significant free fluid identified. No signs of pneumoperitoneum. Musculoskeletal: No acute or significant osseous findings. IMPRESSION: 1. Examination is positive for small bowel obstruction. Transition point is in the pelvis along the anterior margin of known pelvic mass. 2. No significant change in size of pelvic soft tissue mass which is difficult to separate from the adjacent unopacified pelvic bowel loops. 3. Interval placement of right-sided nephroureteral stent with persistent right hydronephrosis. 4. Aortic Atherosclerosis (ICD10-I70.0). Electronically Signed   By: Kerby Moors M.D.   On: 04/04/2022 11:20   DG Abd 1 View  Result Date: 04/03/2022 CLINICAL DATA:  Abdominal pain. EXAM: ABDOMEN - 1 VIEW COMPARISON:  CT renal stone 03/20/2022 FINDINGS: Right ureteral stent is in place. No suspicious calcifications are identified. There are mildly dilated air-filled small bowel loops measuring up to 3.6 cm in the left abdomen, new from prior. Osseous structures are unremarkable. IMPRESSION: 1. New mildly dilated air-filled small bowel loops in the left abdomen may represent ileus/enteritis or partial small bowel obstruction. Correlate clinically. 2.  Right ureteral stent  in place. Electronically Signed   By: Ronney Asters M.D.   On: 04/03/2022 19:08   US RENAL  Result Date: 04/01/2022 CLINICAL DATA:  Acute kidney injury.  Ureteral stent. EXAM: RENAL / URINARY TRACT ULTRASOUND COMPLETE COMPARISON:  CT abdomen and pelvis 03/20/2022 FINDINGS: Right Kidney: Renal measurements: 10.4 x 4.6 x 5.2 cm = volume: 130 mL. Echogenicity is increased. There is moderate hydronephrosis. Right ureteral stent is partially visualized. Left Kidney: Renal measurements: 9.7 x 4.4 x 3.6 cm = volume: 82 mL. Echogenicity within normal limits. No mass or hydronephrosis visualized. Bladder: Under distended and not well evaluated. Other: None. IMPRESSION: 1. Moderate right-sided hydronephrosis persists as seen on recent CT. 2.     Right ureteral stent in place. Electronically Signed   By: Ronney Asters M.D.   On: 04/01/2022 19:37   DG C-Arm 1-60 Min-No Report  Result Date: 03/22/2022 Fluoroscopy was utilized by the requesting physician.  No radiographic interpretation.   CT Renal Stone Study  Result Date: 03/20/2022 CLINICAL DATA:  Flank pain, hematuria EXAM: CT ABDOMEN AND PELVIS WITHOUT CONTRAST TECHNIQUE: Multidetector CT imaging of the abdomen and pelvis was performed following the standard protocol without IV contrast. RADIATION DOSE REDUCTION: This exam was performed according to the departmental dose-optimization program which includes automated exposure control, adjustment of the mA and/or kV according to patient  size and/or use of iterative reconstruction technique. COMPARISON:  05/11/2021 FINDINGS: Lower chest: Small linear densities in the lower lung fields suggest scarring or subsegmental atelectasis. There is ectasia of bronchi. Hepatobiliary: Liver is enlarged measuring 19.6 cm in length. No focal abnormalities are seen. Gallbladder is unremarkable. Pancreas: There is atrophy and pancreas. No focal abnormalities are seen. Spleen: Unremarkable. Adrenals/Urinary Tract: Adrenals are  unremarkable. There is no hydronephrosis on the left side. There is moderate to marked right hydronephrosis. There is dilation of right ureter to the level of pelvic inlet. There are no opaque renal or ureteral stones. There is a soft tissue mass measuring 11.1 x 7 cm in size and pelvis extending more to the right. Left ureter is not dilated. Urinary bladder is not distended. Stomach/Bowel: Stomach is unremarkable. Small bowel loops are not dilated. The appendix is not seen. Colostomy is noted in left mid abdomen. Diverticula are seen in colon without signs of focal diverticulitis. Vascular/Lymphatic: Scattered arterial calcifications are seen. Reproductive: Uterus could not be distinctly identified. Other: There is no ascites or pneumoperitoneum. Small bilateral inguinal hernias containing fat are noted. Small umbilical hernia containing fat is seen. Musculoskeletal: There is minimal anterolisthesis at L4-L5 level. IMPRESSION: There is no evidence of intestinal obstruction or pneumoperitoneum. There is interval appearance of moderate to severe right hydronephrosis. There are no demonstrable opaque renal or ureteral stones. There is 11.1 x 7 cm soft tissue mass in pelvic cavity extending more to the right. Findings suggest possible high-grade obstruction of the distal right ureter caused by extrinsic compression or infiltration by the malignant neoplasm in pelvic cavity. Diverticulosis of colon without signs of focal diverticulitis. 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 Other findings as described in the body of the report. Electronically Signed   By: Elmer Picker M.D.   On: 03/20/2022 14:42      Subjective: Patient seen and examined at the bedside this morning.  Hemodynamically stable for discharge today.  Discharge planning discussed with husband at bedside  Discharge Exam: Vitals:   04/19/22 0426 04/19/22 0834  BP: 135/63   Pulse: 74   Resp: 18   Temp: 98.4 F (36.9 C)   SpO2: 93% 94%    Vitals:   04/19/22 0101 04/19/22 0424 04/19/22 0426 04/19/22 0834  BP:   135/63   Pulse:   74   Resp: '12 16 18   '$ Temp:   98.4 F (36.9 C)   TempSrc:   Oral   SpO2: 96% 97% 93% 94%  Weight:      Height:        General: Pt is alert, awake, not in acute distress, weak, chronically ill looking, deconditioned Cardiovascular: RRR, S1/S2 +, no rubs, no gallops Respiratory: CTA bilaterally, no wheezing, no rhonchi Abdominal: Distended, colostomy, venting G-tube  extremities: No lower extremity pitting edema, no cyanosis    The results of significant diagnostics from this hospitalization (including imaging, microbiology, ancillary and laboratory) are listed below for reference.     Microbiology: No results found for this or any previous visit (from the past 240 hour(s)).   Labs: BNP (last 3 results) No results for input(s): "BNP" in the last 8760 hours. Basic Metabolic Panel: Recent Labs  Lab 04/13/22 0448 04/16/22 0537 04/17/22 0506 04/17/22 1048 04/18/22 0530  NA 136 142 141  --  139  K 3.5 3.2* 3.1*  --  4.0  CL 98 98 102  --  99  CO2 30 33* 31  --  33*  GLUCOSE 162* 149* 264*  --  160*  BUN '14 12 17  '$ --  22  CREATININE 1.01* 1.31* 1.34*  --  1.28*  CALCIUM 7.9* 8.2* 7.3*  --  7.6*  MG 2.0  --   --  1.7  --    Liver Function Tests: Recent Labs  Lab 04/16/22 0537  AST 10*  ALT 11  ALKPHOS 81  BILITOT 0.5  PROT 5.6*  ALBUMIN 2.2*   No results for input(s): "LIPASE", "AMYLASE" in the last 168 hours. No results for input(s): "AMMONIA" in the last 168 hours. CBC: Recent Labs  Lab 04/13/22 0448 04/16/22 0537 04/17/22 0506 04/18/22 0530  WBC 4.2 4.9 5.0 5.5  HGB 8.2* 8.5* 8.2* 8.5*  HCT 25.1* 26.6* 26.2* 26.9*  MCV 92.6 94.7 96.0 96.1  PLT 279 246 208 198   Cardiac Enzymes: No results for input(s): "CKTOTAL", "CKMB", "CKMBINDEX", "TROPONINI" in the last 168 hours. BNP: Invalid input(s): "POCBNP" CBG: No results for input(s): "GLUCAP" in the last  168 hours. D-Dimer No results for input(s): "DDIMER" in the last 72 hours. Hgb A1c No results for input(s): "HGBA1C" in the last 72 hours. Lipid Profile No results for input(s): "CHOL", "HDL", "LDLCALC", "TRIG", "CHOLHDL", "LDLDIRECT" in the last 72 hours. Thyroid function studies No results for input(s): "TSH", "T4TOTAL", "T3FREE", "THYROIDAB" in the last 72 hours.  Invalid input(s): "FREET3" Anemia work up No results for input(s): "VITAMINB12", "FOLATE", "FERRITIN", "TIBC", "IRON", "RETICCTPCT" in the last 72 hours. Urinalysis    Component Value Date/Time   COLORURINE YELLOW 04/02/2022 0309   APPEARANCEUR HAZY (A) 04/02/2022 0309   LABSPEC 1.016 04/02/2022 0309   PHURINE 5.0 04/02/2022 0309   GLUCOSEU NEGATIVE 04/02/2022 0309   HGBUR LARGE (A) 04/02/2022 0309   BILIRUBINUR NEGATIVE 04/02/2022 0309   KETONESUR 5 (A) 04/02/2022 0309   PROTEINUR 100 (A) 04/02/2022 0309   UROBILINOGEN 1.0 02/25/2013 1602   NITRITE NEGATIVE 04/02/2022 0309   LEUKOCYTESUR LARGE (A) 04/02/2022 0309   Sepsis Labs Recent Labs  Lab 04/13/22 0448 04/16/22 0537 04/17/22 0506 04/18/22 0530  WBC 4.2 4.9 5.0 5.5   Microbiology No results found for this or any previous visit (from the past 240 hour(s)).  Please note: You were cared for by a hospitalist during your hospital stay. Once you are discharged, your primary care physician will handle any further medical issues. Please note that NO REFILLS for any discharge medications will be authorized once you are discharged, as it is imperative that you return to your primary care physician (or establish a relationship with a primary care physician if you do not have one) for your post hospital discharge needs so that they can reassess your need for medications and monitor your lab values.    Time coordinating discharge: 40 minutes  SIGNED:   Shelly Coss, MD  [facility] 04/19/2022, 10:13 AM Pager [phone]  If 7PM-7AM, please contact  night-coverage [email or web address] Password [email or web address]

## 2022-04-19 NOTE — TOC Initial Note (Signed)
Transition of Care [facility]) - Initial/Assessment Note    Patient Details  Name: Mariah Schultz MRN: [id number] Date of Birth: 1956-02-08  Transition of [facility]) CM/SW Contact:    Ross Ludwig, LCSW Phone Number: 04/19/2022, 10:59 AM  Clinical Narrative:                 CSW spoke to [place] at [facility] [place], [phone], she said equipment should be delivered before noon.  CSW arranged for EMS to pick up at 12:15pm depending on how busy they are.  Lattie Haw requested to be called once EMS arrives.  CSW updated patient's husband who is aware patient is discharging today.  Patient will be going home with home hospice through [facility].  CSW signing off please reconsult with any other social work needs, home hospice agency has been notified of planned discharge.    Expected Discharge Plan: [facility] Barriers to Discharge: Barriers Resolved   Patient Goals and CMS Choice Patient states their goals for this hospitalization and ongoing recovery are:: To return back home with home hospice service. CMS Medicare.gov Compare Post Acute Care list provided to:: Patient Represenative (must comment) Choice offered to / list presented to : Spouse  Expected Discharge Plan and Services Expected Discharge Plan: [place] In-house Referral: Clinical Social Work   Post Acute Care Choice: Home Health   Expected Discharge Date: 04/19/22               DME Arranged: 3-N-1, Oxygen, Walker rolling with seat, IV pump/equipment, Wheelchair manual DME Agency: Other - Comment ([facility] is supplying equipment.) Date DME Agency Contacted: 04/18/22 Time DME Agency Contacted: [id number] Representative spoke with at DME Agency: [place] Arranged: RN [place] Agency: [place] Date [place]: 04/13/22   Representative spoke with at [place]: [place] [place]  Prior Living Arrangements/Services                        Activities of Daily Living Home Assistive Devices/Equipment: Eyeglasses ADL Screening (condition at time of admission) Patient's cognitive ability adequate to safely complete daily activities?: Yes Is the patient deaf or have difficulty hearing?: No Does the patient have difficulty seeing, even when wearing glasses/contacts?: No Does the patient have difficulty concentrating, remembering, or making decisions?: No Patient able to express need for assistance with ADLs?: Yes Does the patient have difficulty dressing or bathing?: No Independently performs ADLs?: Yes (appropriate for developmental age) Does the patient have difficulty walking or climbing stairs?: No Weakness of Legs: None Weakness of Arms/Hands: None  Permission Sought/Granted                  Emotional Assessment       Orientation: : Oriented to Self, Oriented to Place, Oriented to  Time, Oriented to Situation      Admission diagnosis:  AKI (acute kidney injury) ([place]) [N17.9] Patient Active Problem List   Diagnosis Date Noted   Palliative care patient 04/12/2022   Malignant neoplasm of colon ([place]) 04/01/2022   Cancer related pain 04/01/2022   Dehydration 04/01/2022   Hyponatremia 04/01/2022   Pyuria 04/01/2022   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 04/01/2022   Generalized weakness 04/01/2022   AKI (acute kidney injury) ([place]) 03/21/2022   History of radiation therapy    Hematuria    Normocytic anemia    Hydronephrosis, right    Port-A-Cath in  place 08/09/2021   Hypokalemia 06/25/2021   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 06/22/2021 06/23/2021   Colostomy present ([place]) 06/23/2021   COVID-19 06/23/2021   Mass of pelvis 06/22/2021   Malignant tumor of pelvis ([place]) 06/22/2021   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

## 2022-04-19 NOTE — Progress Notes (Signed)
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  She was admitted from the cancer center on 04/01/2022 with weakness, dehydration, and AKI.  CT abdomen pelvis on 04/04/2022 was consistent with small bowel obstruction.   Palliative medicine team consulted for pain and symptom management  Today's Discussion 04/19/2022  *Please note that this is a verbal dictation therefore any spelling or grammatical errors are due to the "[place]" system interpretation.  Chart reviewed inclusive of vital signs, progress notes, laboratory results, and diagnostic images.   I met with Mariah Schultz and her son at bedside this morning. Mariah Schultz vocalizes that she is ready to go home. She laments on her life and shares that she has lived a wonderful live up until this point. She shares that it is important to her that she go home and spend the remaining time she has left with her family. Created space and opportunity for patient to explore thoughts feelings and fears regarding current medical situation. Tw[place]nee verbalizes acceptance of her situation. She shares that she wishes the outcomes would be different but she understands the present situation for what it is. She vocalizes fear which I was able to support through silence.   Discussed the plan for transition home as her drive is roughly one hour. Discussed how symptoms will be managed during this during her drive - identified that we will go ahead and give her an IV bolus prior to transfer and a PO to allow longer acting relief. She is in agreement with this plan.   Questions and concerns addressed/ Palliative Support Provided.   Objective Assessment: Vital Signs Vitals:   04/19/22 0426 04/19/22 0834  BP: 135/63    Pulse: 74   Resp: 18   Temp: 98.4 F (36.9 C)   SpO2: 93% 94%    Intake/Output Summary (Last 24 hours) at 04/19/2022 1111 Last data filed at 04/18/2022 1842 Gross per 24 hour  Intake 908.4 ml  Output 1400 ml  Net -491.6 ml   Last Weight  Most recent update: 04/03/2022  8:24 PM    Weight  76.5 kg (168 lb 11.2 oz)            Gen:  Older AA F in NAD HEENT: moist mucous membranes CV: Regular rate and rhythm  PULM: On RA breathing comfortably  ABD: Distended EXT: No edema Neuro: Alert and oriented x3  SUMMARY OF RECOMMENDATIONS   DNAR/DNI  Plan for transition home with [facility] this afternoon  Oral dilaudid $RemoveBefor'2mg'cxZxcuULButu$  PO for transfer (1 hour ride home)  Ongoing Palliative support  Billing based on MDM: High  Problems Addressed: One acute or chronic illness or injury that poses a threat to life or bodily function  Amount and/or Complexity of Data: Category 3:Discussion of management or test interpretation with external physician/other qualified health care professional/appropriate source (not separately reported)  Risks: Decision regarding hospitalization or escalation of hospital care and Decision not to resuscitate or to de-escalate care because of poor prognosis ______________________________________________________________________________________ [place] Team Team Cell Phone: [phone] Please utilize secure chat with additional questions, if there is no response within 30 minutes please call the above phone number  Palliative Medicine Team providers are available by phone from 7am to 7pm daily and can be reached through the team  cell phone.  Should this patient require assistance outside of these hours, please call the patient's attending physician.

## 2022-04-20 ENCOUNTER — Telehealth: Payer: Self-pay | Admitting: *Deleted

## 2022-04-20 NOTE — Telephone Encounter (Signed)
TCT patient s/p hospital discharge this week.. No answer. Call made to made her husband's phone. Spoke to him and pt on his phone. Asked pt how she was doing since she got home.  She states she is glad she is home. She has been able to sit out on her back porch and enjoy the fresh air and look at her gardens.  [facility] yesterday. She has a pump -PCA-for her pain control.  She states her pain is fairly well controlled at this time. Asked pt is she was planning to come in for her appt on Friday to see Dede Query, [place]. She states that since she is not getting chemotherapy/immunotherapy anymore, she really does not want to come in. She knows she can call with any questions or concerns.  Future appts cancelled.

## 2022-04-21 ENCOUNTER — Other Ambulatory Visit (HOSPITAL_COMMUNITY): Payer: Self-pay

## 2022-04-22 ENCOUNTER — Inpatient Hospital Stay: Payer: Medicare HMO

## 2022-04-22 ENCOUNTER — Other Ambulatory Visit (HOSPITAL_COMMUNITY): Payer: Self-pay

## 2022-04-22 ENCOUNTER — Inpatient Hospital Stay: Payer: Medicare HMO | Admitting: Nurse Practitioner

## 2022-04-22 ENCOUNTER — Inpatient Hospital Stay: Payer: Medicare HMO | Admitting: Physician Assistant

## 2022-04-26 DIAGNOSIS — Z933 Colostomy status: Secondary | ICD-10-CM | POA: Diagnosis not present

## 2022-04-26 DIAGNOSIS — C20 Malignant neoplasm of rectum: Secondary | ICD-10-CM | POA: Diagnosis not present

## 2022-04-28 ENCOUNTER — Ambulatory Visit: Payer: Medicare HMO | Admitting: Hematology and Oncology

## 2022-04-28 ENCOUNTER — Other Ambulatory Visit: Payer: Medicare HMO

## 2022-05-06 DEATH — deceased

## 2022-06-01 ENCOUNTER — Telehealth: Payer: Self-pay | Admitting: *Deleted

## 2022-06-01 NOTE — Telephone Encounter (Signed)
TCT [facility] to check on pt status. I was advised that patient died at home on 2022-05-15  Message sent to HIM and Dr. Lorenso Courier

## 2022-10-04 IMAGING — MR MR PELVIS W/O CM
7 of 8 series · 41 of 48 positions shown · non-contrast
Comparison: 06/17/2021. PET of 06/04/2021. Clinic note from
10/07/2021.

CLINICAL DATA: Squamous cell carcinoma of presacral region. Poorly
differentiated high-grade carcinoma of unclear origin. Status post
chemotherapy and radiation therapy.

EXAM:
MRI PELVIS WITHOUT CONTRAST
TECHNIQUE: Multiplanar multisequence MR imaging of the pelvis was performed. No
intravenous contrast was administered.

[Series 2: T2 · sagittal · 3.0mm · 0.74mm/px · 7 of 44 slices shown (1 of 5)]
[im 1/44]
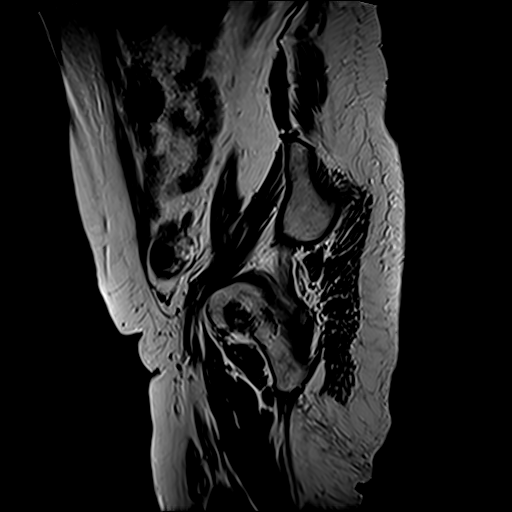
[im 8/44]
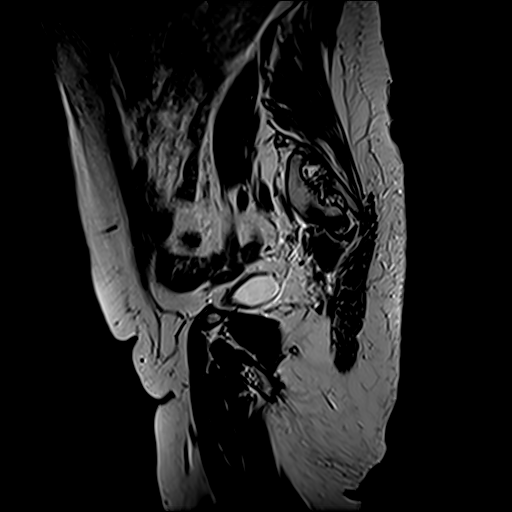
[im 15/44]
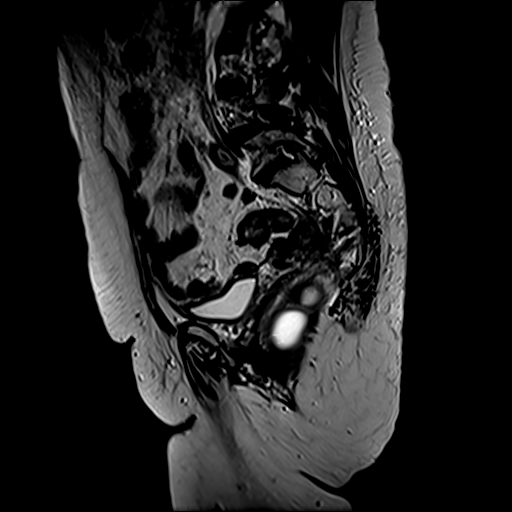
[im 22/44]
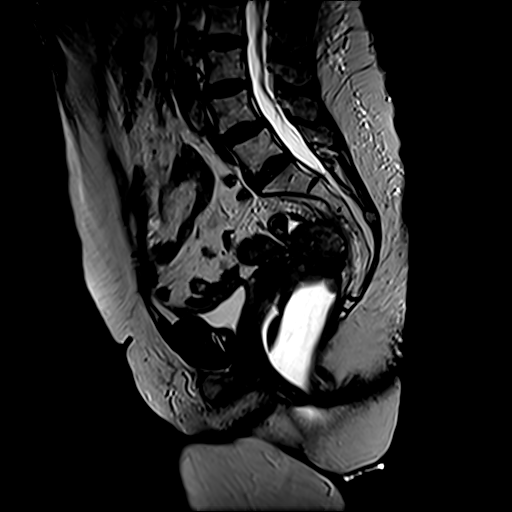
[im 29/44]
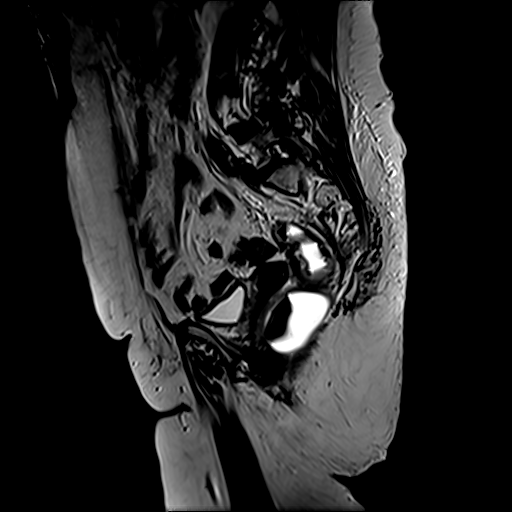
[im 36/44]
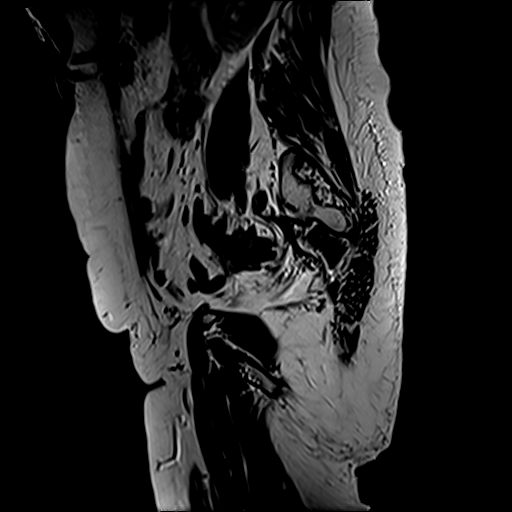
[im 44/44]
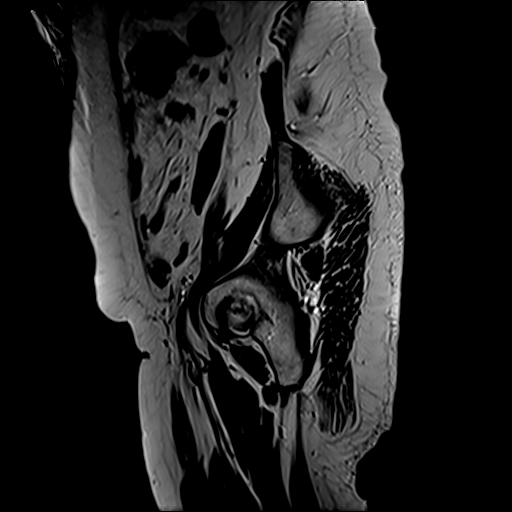

[Series 3: T2 · axial · 5.0mm · 0.99mm/px · z∈[-79,+173]mm · 5 of 43 slices shown (2 of 5)]
[im 1/43]
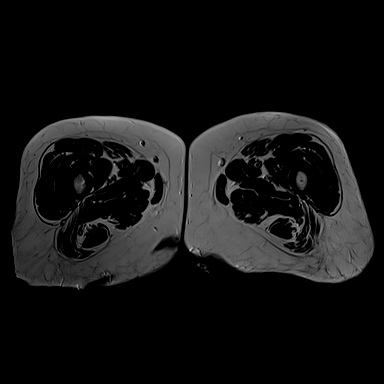
[im 11/43]
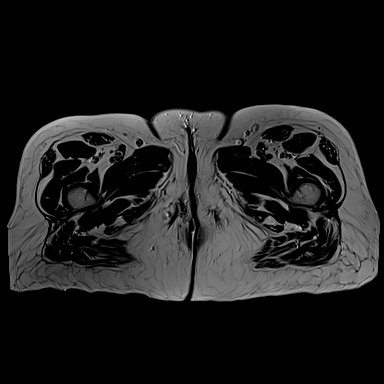
[im 22/43]
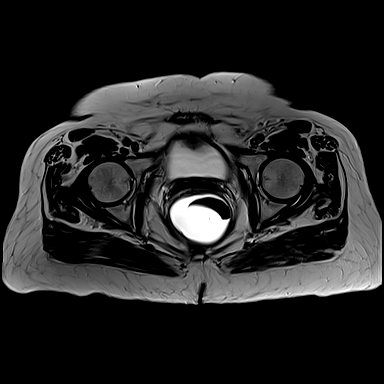
[im 32/43]
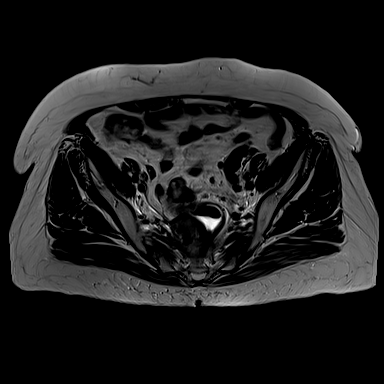
[im 43/43]
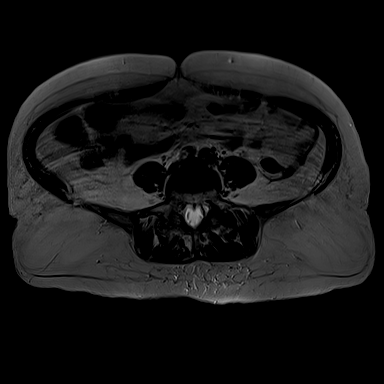

[Series 4: T2 · coronal · 3.0mm · 0.78mm/px · 6 of 51 slices shown (3 of 5)]
[im 1/51]
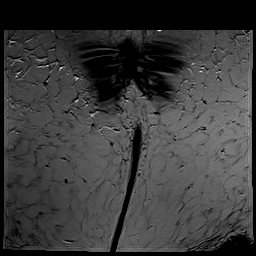
[im 11/51]
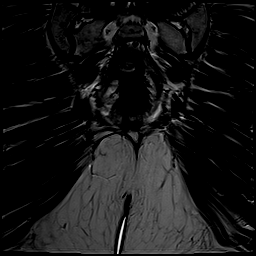
[im 21/51]
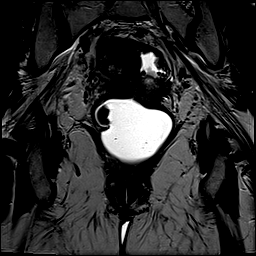
[im 31/51]
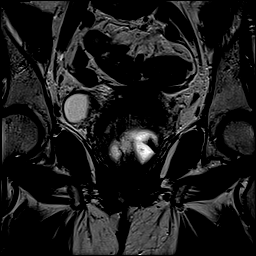
[im 41/51]
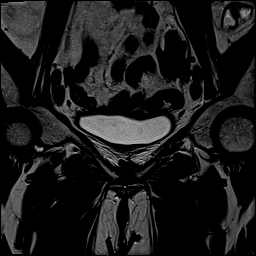
[im 51/51]
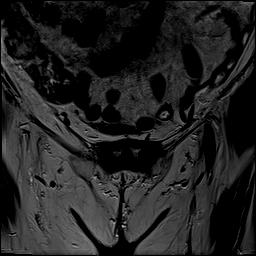

[Series 5: DWI · axial · 5.0mm · 1.48mm/px · z∈[-88,+158]mm · 8 of 82 slices shown (1 of 2)]
[im 1/82]
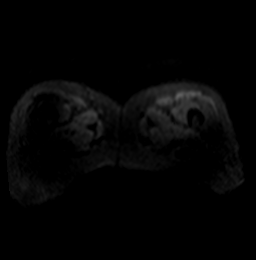
[im 10/82]
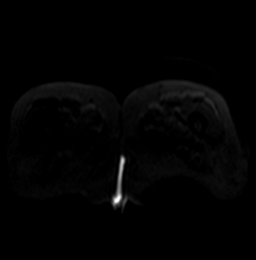
[im 28/82]
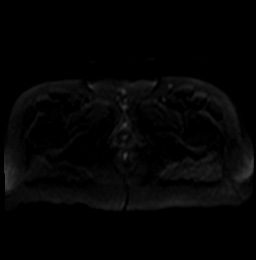
[im 37/82]
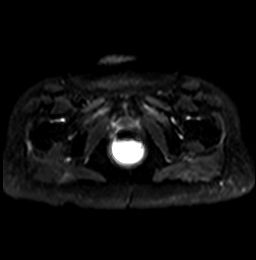
[im 46/82]
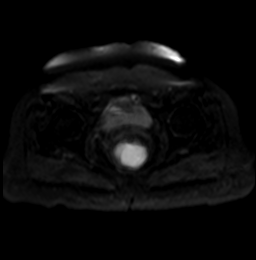
[im 55/82]
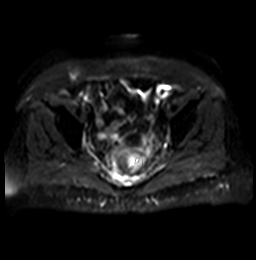
[im 73/82]
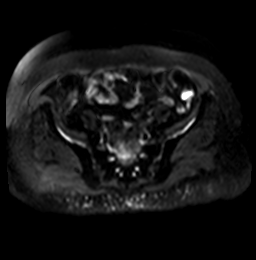
[im 82/82]
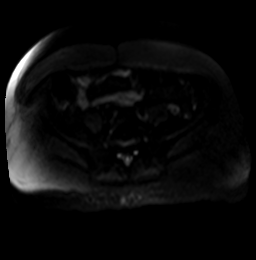

[Series 6: DWI · axial · 5.0mm · 1.48mm/px · z∈[-88,+158]mm · 5 of 42 slices shown (2 of 2)]
[im 1/42]
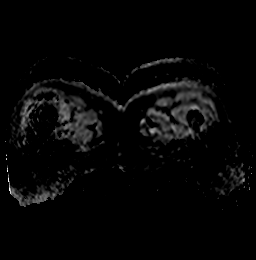
[im 11/42]
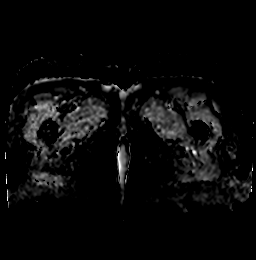
[im 21/42]
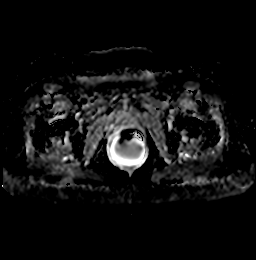
[im 31/42]
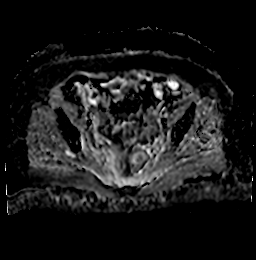
[im 42/42]
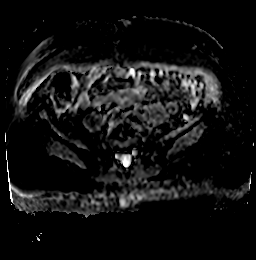

[Series 7: T2 · axial · 3.0mm · 0.70mm/px · z∈[-13,+139]mm · 7 of 52 slices shown (4 of 5)]
[im 1/52]
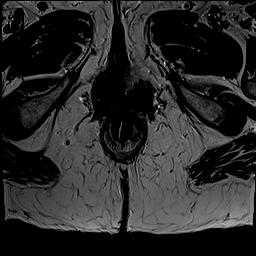
[im 9/52]
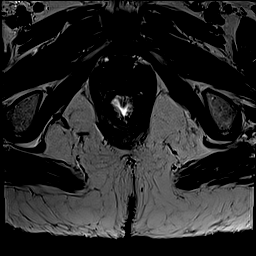
[im 18/52]
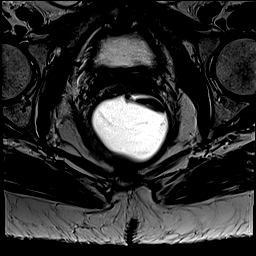
[im 26/52]
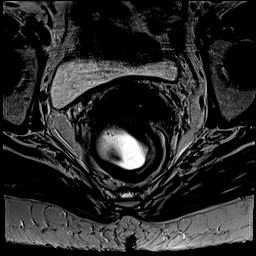
[im 35/52]
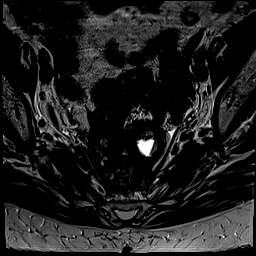
[im 43/52]
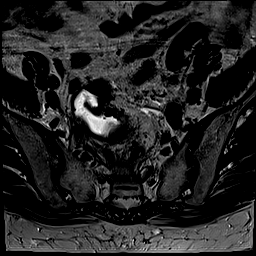
[im 52/52]
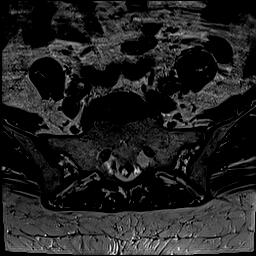

[Series 8: T2 · coronal · 3.0mm · 0.70mm/px · 3 of 34 slices shown (5 of 5)]
[im 1/34]
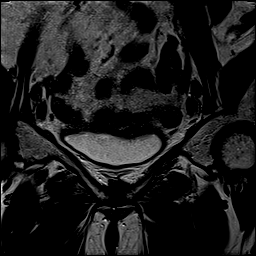
[im 12/34]
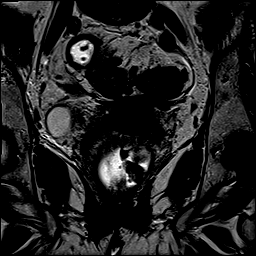
[im 23/34]
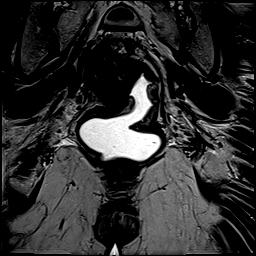

[41 of 48 positions shown; findings below may reference images not displayed]

FINDINGS: Urinary Tract:  No distal hydroureter or bladder abnormality.

Bowel: Normal small bowel caliber. Extensive colonic diverticulosis,
with wall thickening involving the sigmoid, likely muscular
hypertrophy.

Vascular/Lymphatic: No pelvic aneurysm.

The majority of the enlarged nodes described on the prior MRI have
resolved. The previously described dominant node measures 8 x 6 mm
at the 4 o'clock position of the rectosigmoid junction on 18/17.
Comparison 17 x 18 mm at the same level on the prior exam (when
remeasured).

Reproductive:  At least partial hysterectomy.  No adnexal mass.

Other:  No significant free fluid.

The heterogeneous mass centered about the right-side of the
rectosigmoid junction is significantly decreased in size. Example
3.5 x 5.9 cm on transverse image [DATE] versus 7.2 x 8.4 cm at the
same level on the prior exam (when remeasured). Based on sagittal
image [DATE], 3.3 x 3.8 cm today versus 8.7 x 6.3 cm when measured in
a similar fashion on the prior exam. There is circumferential rectal
wall thickening in this area including on [DATE], nonspecific but
possibly related to venous engorgement. No bowel obstruction.

Small bilateral fat containing inguinal hernias.

Musculoskeletal: No acute osseous abnormality.
IMPRESSION: 1. Response to therapy of the soft tissue mass centered about the
right-side of the rectosigmoid junction.
2. Decreased and resolved nodal metastasis as detailed above.
3. No new or progressive disease identified.
4. No bowel obstruction.
